# Patient Record
Sex: Female | Born: 1940
Health system: Southern US, Community
[De-identification: ages and names within clinical notes are randomized; demographics above are authoritative.]

## PROBLEM LIST (undated history)

## (undated) DIAGNOSIS — R011 Cardiac murmur, unspecified: Secondary | ICD-10-CM

## (undated) DIAGNOSIS — K219 Gastro-esophageal reflux disease without esophagitis: Secondary | ICD-10-CM

## (undated) DIAGNOSIS — M1990 Unspecified osteoarthritis, unspecified site: Secondary | ICD-10-CM

## (undated) DIAGNOSIS — C50919 Malignant neoplasm of unspecified site of unspecified female breast: Secondary | ICD-10-CM

## (undated) DIAGNOSIS — E785 Hyperlipidemia, unspecified: Secondary | ICD-10-CM

## (undated) DIAGNOSIS — M545 Low back pain, unspecified: Secondary | ICD-10-CM

## (undated) DIAGNOSIS — Z8719 Personal history of other diseases of the digestive system: Secondary | ICD-10-CM

## (undated) DIAGNOSIS — C44310 Basal cell carcinoma of skin of unspecified parts of face: Secondary | ICD-10-CM

## (undated) DIAGNOSIS — H3530 Unspecified macular degeneration: Secondary | ICD-10-CM

## (undated) DIAGNOSIS — K649 Unspecified hemorrhoids: Secondary | ICD-10-CM

## (undated) DIAGNOSIS — J302 Other seasonal allergic rhinitis: Secondary | ICD-10-CM

## (undated) DIAGNOSIS — R0602 Shortness of breath: Secondary | ICD-10-CM

## (undated) DIAGNOSIS — I10 Essential (primary) hypertension: Secondary | ICD-10-CM

## (undated) DIAGNOSIS — K279 Peptic ulcer, site unspecified, unspecified as acute or chronic, without hemorrhage or perforation: Secondary | ICD-10-CM

## (undated) DIAGNOSIS — G8929 Other chronic pain: Secondary | ICD-10-CM

## (undated) DIAGNOSIS — E119 Type 2 diabetes mellitus without complications: Secondary | ICD-10-CM

## (undated) HISTORY — DX: Unspecified osteoarthritis, unspecified site: M19.90

## (undated) HISTORY — DX: Peptic ulcer, site unspecified, unspecified as acute or chronic, without hemorrhage or perforation: K27.9

## (undated) HISTORY — DX: Malignant neoplasm of unspecified site of unspecified female breast: C50.919

## (undated) HISTORY — PX: CATARACT EXTRACTION BILATERAL W/ ANTERIOR VITRECTOMY: SHX1304

## (undated) HISTORY — DX: Unspecified macular degeneration: H35.30

## (undated) HISTORY — PX: KNEE ARTHROSCOPY: SHX127

## (undated) HISTORY — PX: APPENDECTOMY: SHX54

## (undated) HISTORY — PX: BREAST LUMPECTOMY: SHX2

## (undated) HISTORY — PX: FOOT SURGERY: SHX648

## (undated) HISTORY — DX: Essential (primary) hypertension: I10

## (undated) HISTORY — DX: Hyperlipidemia, unspecified: E78.5

## (undated) HISTORY — PX: EYE SURGERY: SHX253

---

## 1997-11-18 ENCOUNTER — Other Ambulatory Visit: Admission: RE | Admit: 1997-11-18 | Discharge: 1997-11-18 | Payer: Self-pay | Admitting: Obstetrics and Gynecology

## 1999-01-11 ENCOUNTER — Other Ambulatory Visit: Admission: RE | Admit: 1999-01-11 | Discharge: 1999-01-11 | Payer: Self-pay | Admitting: Obstetrics and Gynecology

## 2000-02-21 ENCOUNTER — Other Ambulatory Visit: Admission: RE | Admit: 2000-02-21 | Discharge: 2000-02-21 | Payer: Self-pay | Admitting: Obstetrics and Gynecology

## 2001-01-25 ENCOUNTER — Emergency Department (HOSPITAL_COMMUNITY): Admission: EM | Admit: 2001-01-25 | Discharge: 2001-01-25 | Payer: Self-pay | Admitting: Internal Medicine

## 2001-02-12 DIAGNOSIS — K279 Peptic ulcer, site unspecified, unspecified as acute or chronic, without hemorrhage or perforation: Secondary | ICD-10-CM

## 2001-02-12 HISTORY — DX: Peptic ulcer, site unspecified, unspecified as acute or chronic, without hemorrhage or perforation: K27.9

## 2001-08-12 ENCOUNTER — Inpatient Hospital Stay (HOSPITAL_COMMUNITY): Admission: EM | Admit: 2001-08-12 | Discharge: 2001-08-14 | Payer: Self-pay | Admitting: *Deleted

## 2001-08-12 ENCOUNTER — Encounter: Payer: Self-pay | Admitting: *Deleted

## 2001-09-30 ENCOUNTER — Ambulatory Visit (HOSPITAL_COMMUNITY): Admission: RE | Admit: 2001-09-30 | Discharge: 2001-09-30 | Payer: Self-pay | Admitting: Internal Medicine

## 2002-03-27 ENCOUNTER — Other Ambulatory Visit: Admission: RE | Admit: 2002-03-27 | Discharge: 2002-03-27 | Payer: Self-pay | Admitting: Obstetrics and Gynecology

## 2003-06-01 ENCOUNTER — Other Ambulatory Visit: Admission: RE | Admit: 2003-06-01 | Discharge: 2003-06-01 | Payer: Self-pay | Admitting: Obstetrics and Gynecology

## 2004-01-27 ENCOUNTER — Ambulatory Visit: Payer: Self-pay | Admitting: Gastroenterology

## 2004-11-13 ENCOUNTER — Ambulatory Visit (HOSPITAL_COMMUNITY): Admission: RE | Admit: 2004-11-13 | Discharge: 2004-11-13 | Payer: Self-pay | Admitting: Orthopedic Surgery

## 2004-11-13 ENCOUNTER — Ambulatory Visit (HOSPITAL_BASED_OUTPATIENT_CLINIC_OR_DEPARTMENT_OTHER): Admission: RE | Admit: 2004-11-13 | Discharge: 2004-11-13 | Payer: Self-pay | Admitting: Orthopedic Surgery

## 2004-11-21 ENCOUNTER — Encounter (HOSPITAL_COMMUNITY): Admission: RE | Admit: 2004-11-21 | Discharge: 2004-12-22 | Payer: Self-pay | Admitting: Orthopedic Surgery

## 2006-05-20 ENCOUNTER — Encounter: Admission: RE | Admit: 2006-05-20 | Discharge: 2006-05-20 | Payer: Self-pay | Admitting: Orthopaedic Surgery

## 2007-02-10 ENCOUNTER — Ambulatory Visit (HOSPITAL_COMMUNITY): Admission: RE | Admit: 2007-02-10 | Discharge: 2007-02-10 | Payer: Self-pay | Admitting: Family Medicine

## 2007-09-18 ENCOUNTER — Encounter: Admission: RE | Admit: 2007-09-18 | Discharge: 2007-09-18 | Payer: Self-pay | Admitting: Orthopaedic Surgery

## 2007-12-16 ENCOUNTER — Ambulatory Visit: Admission: RE | Admit: 2007-12-16 | Discharge: 2007-12-16 | Payer: Self-pay | Admitting: Orthopaedic Surgery

## 2008-12-07 ENCOUNTER — Ambulatory Visit: Payer: Self-pay | Admitting: Cardiology

## 2008-12-07 DIAGNOSIS — D649 Anemia, unspecified: Secondary | ICD-10-CM

## 2008-12-07 DIAGNOSIS — H3530 Unspecified macular degeneration: Secondary | ICD-10-CM | POA: Insufficient documentation

## 2008-12-07 DIAGNOSIS — R160 Hepatomegaly, not elsewhere classified: Secondary | ICD-10-CM | POA: Insufficient documentation

## 2008-12-07 DIAGNOSIS — I10 Essential (primary) hypertension: Secondary | ICD-10-CM | POA: Insufficient documentation

## 2009-01-25 ENCOUNTER — Inpatient Hospital Stay (HOSPITAL_COMMUNITY): Admission: RE | Admit: 2009-01-25 | Discharge: 2009-01-27 | Payer: Self-pay | Admitting: Orthopaedic Surgery

## 2009-01-27 ENCOUNTER — Encounter (INDEPENDENT_AMBULATORY_CARE_PROVIDER_SITE_OTHER): Payer: Self-pay | Admitting: Orthopaedic Surgery

## 2009-01-27 ENCOUNTER — Ambulatory Visit: Payer: Self-pay | Admitting: Vascular Surgery

## 2009-02-12 HISTORY — PX: TOTAL KNEE ARTHROPLASTY: SHX125

## 2010-05-15 LAB — CBC
HCT: 33.3 % — ABNORMAL LOW (ref 36.0–46.0)
Hemoglobin: 10.8 g/dL — ABNORMAL LOW (ref 12.0–15.0)
MCV: 78.8 fL (ref 78.0–100.0)
Platelets: 223 10*3/uL (ref 150–400)
RBC: 4.22 MIL/uL (ref 3.87–5.11)
WBC: 8.1 10*3/uL (ref 4.0–10.5)

## 2010-05-15 LAB — BASIC METABOLIC PANEL
BUN: 13 mg/dL (ref 6–23)
Chloride: 101 mEq/L (ref 96–112)
Potassium: 4.4 mEq/L (ref 3.5–5.1)
Sodium: 135 mEq/L (ref 135–145)

## 2010-05-15 LAB — GLUCOSE, CAPILLARY: Glucose-Capillary: 120 mg/dL — ABNORMAL HIGH (ref 70–99)

## 2010-05-16 LAB — URINALYSIS, ROUTINE W REFLEX MICROSCOPIC
Bilirubin Urine: NEGATIVE
Bilirubin Urine: NEGATIVE
Hgb urine dipstick: NEGATIVE
Ketones, ur: NEGATIVE mg/dL
Leukocytes, UA: NEGATIVE
Nitrite: NEGATIVE
Nitrite: NEGATIVE
Protein, ur: NEGATIVE mg/dL
Specific Gravity, Urine: 1.022 (ref 1.005–1.030)
Urobilinogen, UA: 0.2 mg/dL (ref 0.0–1.0)
Urobilinogen, UA: 0.2 mg/dL (ref 0.0–1.0)
pH: 5.5 (ref 5.0–8.0)

## 2010-05-16 LAB — URINE CULTURE
Colony Count: NO GROWTH
Culture: NO GROWTH

## 2010-05-16 LAB — PROTIME-INR: INR: 0.92 (ref 0.00–1.49)

## 2010-05-16 LAB — GLUCOSE, CAPILLARY
Glucose-Capillary: 131 mg/dL — ABNORMAL HIGH (ref 70–99)
Glucose-Capillary: 146 mg/dL — ABNORMAL HIGH (ref 70–99)
Glucose-Capillary: 153 mg/dL — ABNORMAL HIGH (ref 70–99)
Glucose-Capillary: 155 mg/dL — ABNORMAL HIGH (ref 70–99)
Glucose-Capillary: 176 mg/dL — ABNORMAL HIGH (ref 70–99)

## 2010-05-16 LAB — CBC
MCHC: 32.6 g/dL (ref 30.0–36.0)
MCHC: 33.3 g/dL (ref 30.0–36.0)
MCV: 78.2 fL (ref 78.0–100.0)
Platelets: 271 10*3/uL (ref 150–400)
RBC: 4.22 MIL/uL (ref 3.87–5.11)
RDW: 16.2 % — ABNORMAL HIGH (ref 11.5–15.5)
RDW: 16.5 % — ABNORMAL HIGH (ref 11.5–15.5)

## 2010-05-16 LAB — URINE MICROSCOPIC-ADD ON

## 2010-05-16 LAB — COMPREHENSIVE METABOLIC PANEL
ALT: 17 U/L (ref 0–35)
Albumin: 3.8 g/dL (ref 3.5–5.2)
Alkaline Phosphatase: 84 U/L (ref 39–117)
Calcium: 9.3 mg/dL (ref 8.4–10.5)
GFR calc Af Amer: >60 mL/min (ref >60)
Potassium: 4.6 mEq/L (ref 3.5–5.1)
Sodium: 139 mEq/L (ref 135–145)
Total Protein: 6.5 g/dL (ref 6.0–8.3)

## 2010-05-16 LAB — DIFFERENTIAL
Basophils Relative: 1 % (ref 0–1)
Eosinophils Absolute: 0.3 10*3/uL (ref 0.0–0.7)
Lymphs Abs: 2.1 10*3/uL (ref 0.7–4.0)
Monocytes Absolute: 0.5 10*3/uL (ref 0.1–1.0)
Monocytes Relative: 7 % (ref 3–12)

## 2010-05-16 LAB — APTT: aPTT: 26 seconds (ref 24–37)

## 2010-05-16 LAB — TYPE AND SCREEN
ABO/RH(D): O NEG
Antibody Screen: NEGATIVE

## 2010-05-16 LAB — BASIC METABOLIC PANEL
CO2: 32 mEq/L (ref 19–32)
Chloride: 101 mEq/L (ref 96–112)
Creatinine, Ser: 0.65 mg/dL (ref 0.4–1.2)
GFR calc Af Amer: >60 mL/min (ref >60)
Glucose, Bld: 146 mg/dL — ABNORMAL HIGH (ref 70–99)

## 2010-05-16 LAB — HEMOGLOBIN A1C: Hgb A1c MFr Bld: 6.7 % — ABNORMAL HIGH (ref 4.6–6.1)

## 2010-05-16 LAB — ABO/RH: ABO/RH(D): O NEG

## 2010-06-30 NOTE — Consult Note (Signed)
[facility]  Patient:    Shelly [name], Shelly [name] Visit Number: [id number] MRN: [id number]          Service Type: MED Location: 2A A203 01 Attending Physician:  Rosalyn Charters Dictated by:   Tana Coast, P.A. Proc. Date: 08/13/01 Admit Date:  08/12/2001   CC:         Donna Bernard, M.D.  Lionel December, M.D.  Day Surgery   Consultation Report  DATE OF BIRTH:  04-16-1940  PHYSICIAN REQUESTING CONSULTATION:  Donna Bernard, M.D.  REASON FOR ADMISSION:  GI bleed.  HISTORY OF PRESENT ILLNESS:  The patient is a 70 year old Caucasian female with past medical history significant for non-insulin-dependent diabetes mellitus (diet controlled), hypertension, arthritis/degenerative disk disease, asthma, who was admitted yesterday with acute GI bleed.  The patient developed three episodes of melena at home.  She was found to have maroon-colored stool by rectal examination in the emergency department.  She became very weak and lightheaded at home and therefore presented to the emergency department.  She was found to be hypotensive with a blood pressure 84/45, pulse rate was 95. She takes Lodine three tablets daily at home for back pain.  She is also on aspirin 81 mg daily.  She takes Tagamet p.r.n.  She denies any history of abdominal pain, vomiting, weight loss, constipation, diarrhea.  She had some nausea a couple of days ago which was self-limiting.  She has had a great appetite.  She has had an EGD and flexible sigmoidoscopy by Dr. Juanda Chance about five years ago.  She tells me she has a hiatal hernia and no history of polyps.  At that time she had an ultrasound which revealed an enlarged liver and gallstones.  She has a family history significant for colon cancer in a half sister who was diagnosed in her late-60s.  In the emergency department she was found to have a hemoglobin of 10.1, hematocrit 29.7, MCV 78.3.  This morning her hemoglobin dropped to  8.8 and hematocrit 25.4.  Her vital signs are stable.  She denies any chest pain or shortness of breath.  She had had no further bleeding since admission.  She has had no further melena or maroon stools since admission.  MEDICATIONS AT HOME: 1. Prinivil 20 mg q.d. 2. Tagamet p.r.n. 3. Allegra p.r.n. 4. Albuterol inhaler p.r.n. 5. Lodine 2-3 tablets daily. 6. Vitamin C and D two tablets q.d. 7. Presivision four tablets q.d. 8. Aspirin 81 mg q.d. 9. Multivitamin q.d.  CURRENT MEDICATIONS: 1. Protonix 40 mg IV q.24h. 2. Normal saline at 75 cc/h. 3. Xanax 0.5 mg b.i.d. p.r.n.  ALLERGIES:  PENICILLIN causes hives.  PAST MEDICAL HISTORY: 1. Diet-controlled non-insulin-dependent diabetes mellitus. 2. Hypertension. 3. Asthma. 4. Macular degeneration. 5. Arthritis. 6. She gives a history of a hiatal hernia, hepatomegaly and cholelithiasis.    Previously worked up by Dr. Juanda Chance.  PAST SURGICAL HISTORY: 1. Bilateral bunionectomy. 2. Left laser surgery of the eye. 3. Appendectomy.  FAMILY HISTORY:  A half sister was diagnosed with colon cancer at age 66, she underwent surgery with chemotherapy and doing well.  She also had uterine cancer years ago.  Her mother died of non-Hodgkins lymphoma.  She has a nephew who died of non-Hodgkins lymphoma/AIDS.  SOCIAL HISTORY:  She lives at home.  She is divorced.  She has two children who live out of town.  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  She quit smoking  in 1983.  Denies any alcohol use.  REVIEW OF SYSTEMS:  Please see HPI for GI in general.  Cardiopulmonary: Denies any chest pain.  She complains of shortness of breath associated with her allergies.  She complains of occasional palpitations which are brief and relieved with taking a deep breath.  PHYSICAL EXAMINATION:  VITAL SIGNS:  Height 5 feet 6 inches, weight 239.7.  Temperature maximum 97.7, temperature current 97.6.  Pulse 76.  Blood pressure  121/59.  Respirations 18.  GENERAL:  A very pleasant, well-nourished, well-developed, pale Caucasian female in no acute distress.  SKIN:  Warm and dry, pale.  HEENT:  Conjunctivae are pale.  Sclerae are nonicteric.  Oropharyngeal mucosa moist and pink.  CHEST:  Clear to auscultation.  CARDIAC EXAM:  Reveals a regular rate and rhythm, a 2/6 systolic ejection murmur heard best in the upper precordium.  ABDOMEN:  Positive bowel sounds, obese but symmetrical, soft and nontender. No organomegaly or masses appreciated.  RECTAL EXAMINATION:  Performed in the emergency department.  She was found to have maroon stool in the rectal vault, Hemoccult positive and external hemorrhoid.  EXTREMITIES:  No cyanosis or edema.  LABORATORIES:  As in HPI.  In addition, she had a white count of 10.7 yesterday, today 9.6; platelets 286,000.  PT 13, INR 1.1, PTT 20.  Sodium 139, potassium 4.6, BUN 39, creatinine 1.2, glucose 171, total bilirubin 0.4, alkaline phosphatase 78, SGOT 15, SGPT 17, albumin 3.2.  IMPRESSION:  The patient is a pleasant 70 year old Caucasian female who was admitted with acute gastrointestinal bleed.  I suspect the source is upper GI tract possibly from peptic ulcer disease given her NSAID use.  Initially she was hypotensive, but currently her vital signs are stable.  Her hemoglobin has dropped 1.3 g/dL over the last 12 hours.  Other possibilities include a bleed from the proximal colon.  SUGGESTIONS: 1. EGD today. 2. Monitor hemoglobin and hematocrit; if hemoglobin drops less than 8 g/dL,    consider transfusion. 3. If EGD is negative, she will need a colonoscopy in the near future.  I    would recommend a colonoscopy at some point anyhow given her family history     positive for colon cancer and the patient reports never having a complete    colonoscopy before. 4. Continue IV Protonix 40 mg q.24h. 5. Continue supportive measures.  I would like to thank Dr. Donna Bernard for allowing [place] to take part in the care of this patient. Dictated by:   Tana Coast, P.A. Attending Physician:  Rosalyn Charters DD:  08/13/01 TD:  08/13/01 Job: [id number]

## 2010-06-30 NOTE — Discharge Summary (Signed)
   NAMEKEYONDA, BICKLE NO.:  [id number]   MEDICAL RECORD NO.:  [id number]                  PATIENT TYPE:   LOCATION:                                       FACILITY:   PHYSICIAN:  Donna Bernard, M.D.             DATE OF BIRTH:   DATE OF ADMISSION:  08/12/2001  DATE OF DISCHARGE:  08/14/2001                                 DISCHARGE SUMMARY   FINAL DIAGNOSES:  1. GI hemorrhage  2. Peptic ulcer disease  3. Anemia secondary to #1  4. Type 2 diabetes   FINAL DISPOSITION:  1. The patient discharged to home.  2. The patient to follow-up in the office in a couple of weeks.  3. Iron gluconate one table t.i.d.  4. Vicodin 1 q. 6 p.r.n. for pain.  5. Prevpac to start in one week to take for 10 days.   ADMISSION HISTORY AND PHYSICAL:  Please see H&P as dictated.   HOSPITAL COURSE:  This patient is a 70 year old white female with a history  of type 2 diabetes who presented to the emergency room the day of admission  with having passed a large amount of dark black stools. The patient noted a  slight chill. She had no significant abdominal discomfort. She was evaluated  in the emergency room and found to be experiencing an acute GI hemorrhage.  Abdominal exam was benign. Hemoglobin was 10.1. Other blood work was good.  The GI folks were consulted. The patient was placed on IV fluids. She was  also given IV proton pump inhibitor therapy. Serial hemoglobins were  monitored and these stayed in the neighborhood of the high eights. Endoscopy  test revealed gastric ulcer along with positive CLOtest. Forty-eight hours  post admission, the patient was feeling better. Her GI bleeding had stopped  completely and hemoglobin remained stable. The patient was discharged home  with diagnosis and disposition as noted above.                                               Donna Bernard, M.D.    Karie Chimera  D:  09/08/2001  T:  09/15/2001  Job:  [id number]

## 2010-06-30 NOTE — Procedures (Signed)
   NAMESAKIYAH, Shelly [name]                        ACCOUNT NO.:  [id number]   MEDICAL RECORD NO.:  [id number]                   PATIENT TYPE:  INP   LOCATION:  A203                                 FACILITY:  [facility]   PHYSICIAN:  Donna Bernard, M.D.             DATE OF BIRTH:  Apr 24, 1940   DATE OF PROCEDURE:  08/13/2001  DATE OF DISCHARGE:  08/14/2001                                EKG INTERPRETATION   PROCEDURE PERFORMED:  EKG   INTERPRETATION:  EKG reveals normal sinus rhythm with no significant ST-T  changes.  There is poor R-wave progression V1-V3, however, a tiny R is  present in all three leads.                                                Donna Bernard, M.D.    Karie Chimera  D:  09/08/2001  T:  09/15/2001  Job:  [id number]

## 2010-06-30 NOTE — Group Therapy Note (Signed)
[facility]  Patient:    Shelly [name], Shelly [name] Visit Number: [id number] MRN: [id number]          Service Type: MED Location: 2A A203 01 Attending Physician:  Harlow Asa Dictated by:   Lilyan Punt, M.D. Proc. Date: 08/13/01 Admit Date:  08/12/2001 Discharge Date: 08/14/2001                               Progress Note  SUBJECTIVE:  The patient overall doing well this morning. Did not have any maroon stools during the night. States she did not have any abdominal cramps, pain, chest tightness, shortness of breath, or dizziness. Taking IV fluids, urinating.  PHYSICAL EXAMINATION:  Lungs CTA. Heart is regular. Abdomen is soft, no guarding or rebound. Extremities no edema.  ASSESSMENT AND PLAN:  Gastrointestinal bleed--hemoglobin down to 8.8. Certainly this is a drop, could well be dilutional given the IV fluids along with some further gastrointestinal bleed. The patient may well need two units if she goes below 8. Will monitor this closely and recheck a hemoglobin and hematocrit at 4 p.m. later today and follow up the patient later today. Dictated by:   Lilyan Punt, M.D. Attending Physician:  Harlow Asa DD:  08/13/01 TD:  08/14/01 Job: [id number]

## 2010-06-30 NOTE — H&P (Signed)
[facility]  Patient:    Shelly [name], YODICE Visit Number: [id number] MRN: [id number]          Service Type: MED Location: 2A A203 01 Attending Physician:  Harlow Asa Dictated by:   Lilyan Punt, M.D. Admit Date:  08/12/2001 Discharge Date: 08/14/2001                           History and Physical  CHIEF COMPLAINT:  Dark maroon stools with dizziness.  HISTORY OF PRESENT ILLNESS:  A 70 year old white female relates she was cutting grass on a riding lawnmower and felt the urge to defecate on the evening of August 12, 2001.  She went to the bathroom and passed a large amount of dark black/maroon stool, and 10-15 minutes later passed a second one, and 10-15 minutes after that passed a third one, all of about equal quantity.  The patient denied any abdominal pain or discomfort.  States that she felt chilled afterwards.  She denied any nausea.  Denied vomiting.  Denied sweats, chest tightness, or pressure pain.  Denied any recent abdominal discomfort such as cramps, pain, or chills.  No change in bowel movement patterns.  Generally normal in the past, not black, not painful.  The patient does state that she has a history of hemorrhoids that occasionally bleeds, but this is usually bright red blood.  PAST MEDICAL HISTORY 1. Hiatal hernia. 2. Type 2 diabetes, diet-controlled. 3. Arthritis in the hips, back, and hands. 4. Macular degeneration. 5. Enlarged liver. 6. Hypertension. 7. Allergic rhinitis. 8. History of appendicitis.  SOCIAL HISTORY:  Married.  Does not smoke.  Two children.  FAMILY HISTORY:  Colon cancer in a half-sister.  Hypertension, diabetes, heart disease.  ALLERGIES:  FLU SHOT gives her the flu.  AMOXICILLIN gave her hives and a rash.  CELEBREX hurt her stomach.  REVIEW OF SYSTEMS:  Negative for headaches, sinus symptoms, dysphagia, chest tightness, SOB, swelling in the legs.  NEUROLOGIC: Normal.  PE, GYN:  NAD.  PHYSICAL  EXAMINATION  VITAL SIGNS:  Stable, heart rate 90.  HEENT:  Tympanic membranes and LT - NL.  NECK:  Supple, no masses.  CHEST:  CTA, RNL.  HEART:  Regular, no murmurs.  ABDOMEN:  Soft, obese, no guarding or rebound, no tenderness.  EXTREMITIES:  No edema.  NEUROLOGIC:  Grossly normal.  RECTAL:  Dark maroon blood noted.  LABORATORY DATA:  On August 12, 2001:  Hemoglobin 10.1, hematocrit 29.7, WBC 10.7. Sodium 139, potassium 4.6, BUN 38, creatinine 1.2, glucose 171. Specific gravity 1.030, bilirubin large, blood large.  Type and screen done x2.  ASSESSMENT/PLAN 1. Gastrointestinal bleed with dark maroon blood is either a proximal to    the ileocecal valve colon bleed, or a stomach/duodenal bleed.  I feel the patient will probably need an EGD and a colonoscopy.  She is n.p.o.  Hopefully can get her done on August 13, 2001.  2. Diabetes type 2.  CHEST:  Accu-Cheks q.a.m.  3. Arthritis.  It is doubtful that the patient should be on NSAIDs after this episode, but certainly will wait for the etiology of her bleed, before making that final decision.  See the orders. Dictated by:   Lilyan Punt, M.D. Attending Physician:  Harlow Asa DD:  08/13/01 TD:  08/15/01 Job: [id number]

## 2010-06-30 NOTE — Op Note (Signed)
NAMEFINNLEIGH, MARCHETTI              ACCOUNT NO.:  [id number]   MEDICAL RECORD NO.:  [id number]          PATIENT TYPE:  AMB   LOCATION:  DSC                          FACILITY:  [facility]   PHYSICIAN:  Feliberto Gottron. Turner Daniels, M.D.   DATE OF BIRTH:  Jun 01, 1940   DATE OF PROCEDURE:  11/13/2004  DATE OF DISCHARGE:  11/13/2004                                 OPERATIVE REPORT   PREOPERATIVE DIAGNOSIS:  Left knee medial meniscal tear with chondromalacia.   POSTOPERATIVE DIAGNOSIS:  1.  Left knee medial meniscal tear.  2.  Chondromalacia, grade 4, from the lateral facet of the patella, lateral      femoral condyle, distally and anteriorly.  3.  Grade 3 chondromalacia from the medial femoral condyle with flap tears.   PROCEDURE:  Arthroscopic debridement of same.   SURGEON:  Feliberto Gottron. Turner Daniels, MD.   FIRST ASSISTANT:  Skip Mayer, PA-C.   ANESTHETIC:  General LMA.   ESTIMATED BLOOD LOSS:  Minimal.   FLUID REPLACEMENT:  800 mL of crystalloid.   DRAINS PLACED:  None.   TOURNIQUET TIME:  None.   INDICATIONS FOR PROCEDURE:  The patient is a 70 year old woman with  arthritic changes to the left knee by x-ray, mechanical catching, popping,  locking and pain in the knee, who desires arthroscopic evaluation and  treatment after the failure of conservative treatment.  The x-rays show  lateral greater than medial compartment osteophytes, 3 mm of cartilage  laterally and 5 mm medially.  She was treated preoperatively with a  cortisone injection and unfortunately only last for a few days.   DESCRIPTION OF PROCEDURE:  The patient was identified by armband and taken  to the operating room at [facility].  Appropriate anesthetic  monitors were attached and general LMA anesthesia induced with the patient  in a supine position.  A lateral post was applied to the table and the left  lower extremity prepped and draped in the usual sterile fashion from the  ankle to the mid-thigh.  Using a #11 blade,  standard inferomedial and  inferolateral peripatellar portals were then made, allowing introduction of  the arthroscope through the inferolateral portal and the outflow through the  inferomedial portal.  We immediately encountered grade 4 chondromalacia of  the anterior and distal aspects of the lateral femoral condyle as well as  the lateral facet of the patella.  Moving to the medial compartment, there  was some degenerative tearing and parrot beak tearing with horizontal  cleavage of the medial meniscus; this was debrided, as were some flap tears  of the medial femoral condyle itself.  The lateral meniscus appeared to be  in relatively good condition.  The edges of the grade 4 area at the lateral  femoral condyle were lightly debrided, smoothing them out as best we could,  but was down to bare bone on bare bone and she may  be a candidate for a total knee replacement of her symptoms persist.  At  this point the knee was irrigated out with normal saline solution, the  arthroscopic instruments removed, a dressing of  Xeroform, 4 x 4 dressing  sponges, Webril and an Ace wrap applied.  The patient was then awakened and  taken to the recovery room without difficulty.      Feliberto Gottron. Turner Daniels, M.D.  Electronically Signed     FJR/MEDQ  D:  01/09/2005  T:  01/10/2005  Job:  [id number]

## 2010-08-21 ENCOUNTER — Encounter: Payer: Self-pay | Admitting: Cardiology

## 2010-08-22 ENCOUNTER — Encounter: Payer: Self-pay | Admitting: Cardiology

## 2010-08-22 ENCOUNTER — Ambulatory Visit (INDEPENDENT_AMBULATORY_CARE_PROVIDER_SITE_OTHER): Payer: Medicare Other | Admitting: Cardiology

## 2010-08-22 DIAGNOSIS — E119 Type 2 diabetes mellitus without complications: Secondary | ICD-10-CM

## 2010-08-22 DIAGNOSIS — E785 Hyperlipidemia, unspecified: Secondary | ICD-10-CM

## 2010-08-22 DIAGNOSIS — K8020 Calculus of gallbladder without cholecystitis without obstruction: Secondary | ICD-10-CM | POA: Insufficient documentation

## 2010-08-22 DIAGNOSIS — J45909 Unspecified asthma, uncomplicated: Secondary | ICD-10-CM

## 2010-08-22 DIAGNOSIS — Z01810 Encounter for preprocedural cardiovascular examination: Secondary | ICD-10-CM

## 2010-08-22 DIAGNOSIS — M1990 Unspecified osteoarthritis, unspecified site: Secondary | ICD-10-CM | POA: Insufficient documentation

## 2010-08-22 DIAGNOSIS — I10 Essential (primary) hypertension: Secondary | ICD-10-CM

## 2010-08-22 DIAGNOSIS — K279 Peptic ulcer, site unspecified, unspecified as acute or chronic, without hemorrhage or perforation: Secondary | ICD-10-CM

## 2010-08-22 NOTE — Patient Instructions (Signed)
Your physician recommends that you schedule a follow-up appointment in: AS NEEDED  

## 2010-08-28 ENCOUNTER — Encounter: Payer: Self-pay | Admitting: *Deleted

## 2010-08-30 ENCOUNTER — Encounter: Payer: Self-pay | Admitting: Cardiology

## 2010-08-30 DIAGNOSIS — E785 Hyperlipidemia, unspecified: Secondary | ICD-10-CM | POA: Insufficient documentation

## 2010-08-30 NOTE — Assessment & Plan Note (Addendum)
Patient is an appropriate candidate for the proposed orthopaedic procedure.  In the absence of known cardiac disease or symptoms to suggest the presence of cardiac disease, no preoperative testing or treatment is warranted.  Her medications include a beta blocker, which might tend to reduce perioperative complications.  [facility] will be happy to assist with any issues of a cardiac nature that arise while she is in hospital or subsequently.

## 2010-08-30 NOTE — Progress Notes (Signed)
HPI : Ms. Manzer returns to the office for reevaluation prior to planned left TKA.  I first evaluated her prior to surgery on her right knee 2 years ago.  She has no known cardiac disease other than a history of heart murmur.  She has no exertional symptoms.  She denies orthopnea, PND, exertional dyspnea, lightheadedness and syncope.  She has mild intermittent pedal edema.  Cardiovascular risk factors include age, postmenopausal status and diabetes controlled with oral agents.  She has mild hyperlipidemia with some difficulty tolerating lipid-lowering medication.  A recent lipid profile is not available to me.  Current Outpatient Prescriptions  Medication Sig Dispense Refill  . albuterol (PROAIR HFA) 108 (90 BASE) MCG/ACT inhaler Inhale 2 puffs into the lungs every 6 (six) hours as needed.        Marland Kitchen amLODipine (NORVASC) 10 MG tablet Take 10 mg by mouth daily.        . benazepril (LOTENSIN) 40 MG tablet Take 40 mg by mouth daily.        . Calcium 600 MG tablet Take 600 mg by mouth 2 (two) times daily.        . carvedilol (COREG) 6.25 MG tablet Take 6.25 mg by mouth 2 (two) times daily.        . celecoxib (CELEBREX) 200 MG capsule Take 200 mg by mouth daily.        . cetirizine (ZYRTEC) 10 MG tablet Take 10 mg by mouth 2 (two) times daily.        . clobetasol (TEMOVATE) 0.05 % cream       . cyclobenzaprine (FLEXERIL) 10 MG tablet Take 10 mg by mouth 3 (three) times daily as needed.        . metFORMIN (GLUMETZA) 500 MG (MOD) 24 hr tablet Take 500 mg by mouth 2 (two) times daily. 2 tabs bid       . moxifloxacin (VIGAMOX) 0.5 % ophthalmic solution Place 1 drop into the right eye 3 (three) times daily.        . Multiple Vitamins-Minerals (PRESERVISION AREDS PO) Take 1 tablet by mouth daily.        . TRICOR 145 MG tablet Take 145 mg by mouth daily.           Allergies  Allergen Reactions  . Celebrex (Celecoxib) Other (See Comments)    Abdominal pain; history of upper GI bleed   . Penicillins  Hives      Past medical history, social history, and family history reviewed and updated.  ROS:   PHYSICAL EXAM: BP 146/89  Pulse 89  Ht 5\' 6"  (1.676 m)  Wt 112.492 kg (248 lb)  BMI 40.03 kg/m2  SpO2 92%  General-Well developed; no acute distress Body habitus-proportionate weight and height Neck-No JVD; no carotid bruits Lungs-clear lung fields; resonant to percussion Cardiovascular-normal PMI; normal S1 and S2 Abdomen-normal bowel sounds; soft and non-tender without masses or organomegaly Musculoskeletal-No deformities, no cyanosis or clubbing Neurologic-Normal cranial nerves; symmetric strength and tone Skin-Warm, no significant lesions; multiple skin tags including on the right eyelid. Extremities-distal pulses intact; no edema  EKG: Normal sinus rhythm; left axis deviation; right ventricular conduction delay; delayed R-wave progression; no previous tracing for comparison.  ASSESSMENT AND PLAN:

## 2010-09-01 ENCOUNTER — Encounter: Payer: Self-pay | Admitting: Cardiology

## 2010-10-09 ENCOUNTER — Encounter (INDEPENDENT_AMBULATORY_CARE_PROVIDER_SITE_OTHER): Payer: Medicare Other | Admitting: Ophthalmology

## 2010-10-09 DIAGNOSIS — H43819 Vitreous degeneration, unspecified eye: Secondary | ICD-10-CM

## 2010-10-09 DIAGNOSIS — H353290 Exudative age-related macular degeneration, unspecified eye, stage unspecified: Secondary | ICD-10-CM

## 2010-10-09 DIAGNOSIS — H3530 Unspecified macular degeneration: Secondary | ICD-10-CM

## 2010-12-18 ENCOUNTER — Encounter (INDEPENDENT_AMBULATORY_CARE_PROVIDER_SITE_OTHER): Payer: Medicare Other | Admitting: Ophthalmology

## 2010-12-18 DIAGNOSIS — H3530 Unspecified macular degeneration: Secondary | ICD-10-CM

## 2010-12-18 DIAGNOSIS — H353290 Exudative age-related macular degeneration, unspecified eye, stage unspecified: Secondary | ICD-10-CM

## 2010-12-18 DIAGNOSIS — H43819 Vitreous degeneration, unspecified eye: Secondary | ICD-10-CM

## 2011-02-13 HISTORY — PX: SKIN CANCER EXCISION: SHX779

## 2011-02-26 ENCOUNTER — Encounter (INDEPENDENT_AMBULATORY_CARE_PROVIDER_SITE_OTHER): Payer: Medicare Other | Admitting: Ophthalmology

## 2011-02-26 DIAGNOSIS — H43819 Vitreous degeneration, unspecified eye: Secondary | ICD-10-CM

## 2011-02-26 DIAGNOSIS — H353290 Exudative age-related macular degeneration, unspecified eye, stage unspecified: Secondary | ICD-10-CM

## 2011-02-26 DIAGNOSIS — H3530 Unspecified macular degeneration: Secondary | ICD-10-CM

## 2011-05-07 ENCOUNTER — Encounter (INDEPENDENT_AMBULATORY_CARE_PROVIDER_SITE_OTHER): Payer: Medicare Other | Admitting: Ophthalmology

## 2011-05-07 DIAGNOSIS — H43819 Vitreous degeneration, unspecified eye: Secondary | ICD-10-CM

## 2011-05-07 DIAGNOSIS — H353290 Exudative age-related macular degeneration, unspecified eye, stage unspecified: Secondary | ICD-10-CM

## 2011-05-07 DIAGNOSIS — H3530 Unspecified macular degeneration: Secondary | ICD-10-CM

## 2011-07-04 ENCOUNTER — Other Ambulatory Visit: Payer: Self-pay | Admitting: Orthopaedic Surgery

## 2011-07-04 DIAGNOSIS — M545 Low back pain: Secondary | ICD-10-CM

## 2011-07-05 ENCOUNTER — Ambulatory Visit
Admission: RE | Admit: 2011-07-05 | Discharge: 2011-07-05 | Disposition: A | Discharge status: Home / Self Care | Payer: Medicare Other | Source: Ambulatory Visit | Attending: Orthopaedic Surgery | Admitting: Orthopaedic Surgery

## 2011-07-05 DIAGNOSIS — M545 Low back pain: Secondary | ICD-10-CM

## 2011-07-16 ENCOUNTER — Encounter (INDEPENDENT_AMBULATORY_CARE_PROVIDER_SITE_OTHER): Payer: Medicare Other | Admitting: Ophthalmology

## 2011-07-16 DIAGNOSIS — H43819 Vitreous degeneration, unspecified eye: Secondary | ICD-10-CM

## 2011-07-16 DIAGNOSIS — H353290 Exudative age-related macular degeneration, unspecified eye, stage unspecified: Secondary | ICD-10-CM

## 2011-07-16 DIAGNOSIS — H3530 Unspecified macular degeneration: Secondary | ICD-10-CM

## 2011-08-21 ENCOUNTER — Encounter (INDEPENDENT_AMBULATORY_CARE_PROVIDER_SITE_OTHER): Payer: Self-pay | Admitting: *Deleted

## 2011-09-24 ENCOUNTER — Encounter (INDEPENDENT_AMBULATORY_CARE_PROVIDER_SITE_OTHER): Payer: Medicare Other | Admitting: Ophthalmology

## 2011-09-24 DIAGNOSIS — H35039 Hypertensive retinopathy, unspecified eye: Secondary | ICD-10-CM

## 2011-09-24 DIAGNOSIS — H43819 Vitreous degeneration, unspecified eye: Secondary | ICD-10-CM

## 2011-09-24 DIAGNOSIS — H353290 Exudative age-related macular degeneration, unspecified eye, stage unspecified: Secondary | ICD-10-CM

## 2011-09-24 DIAGNOSIS — H3530 Unspecified macular degeneration: Secondary | ICD-10-CM

## 2011-11-13 ENCOUNTER — Other Ambulatory Visit: Payer: Self-pay

## 2011-11-13 DIAGNOSIS — R0602 Shortness of breath: Secondary | ICD-10-CM

## 2011-11-27 ENCOUNTER — Other Ambulatory Visit (HOSPITAL_COMMUNITY): Payer: Self-pay | Admitting: Internal Medicine

## 2011-11-27 ENCOUNTER — Ambulatory Visit (HOSPITAL_COMMUNITY)
Admission: RE | Admit: 2011-11-27 | Discharge: 2011-11-27 | Disposition: A | Discharge status: Home / Self Care | Payer: Medicare Other | Source: Ambulatory Visit | Attending: Internal Medicine | Admitting: Internal Medicine

## 2011-11-27 DIAGNOSIS — Z139 Encounter for screening, unspecified: Secondary | ICD-10-CM

## 2011-11-27 DIAGNOSIS — R0602 Shortness of breath: Secondary | ICD-10-CM | POA: Insufficient documentation

## 2011-11-27 DIAGNOSIS — I700 Atherosclerosis of aorta: Secondary | ICD-10-CM | POA: Insufficient documentation

## 2011-11-27 MED ORDER — ALBUTEROL SULFATE (5 MG/ML) 0.5% IN NEBU
2.5000 mg | INHALATION_SOLUTION | Freq: Once | RESPIRATORY_TRACT | Status: AC
  Administered 2011-11-27: 2.5 mg via RESPIRATORY_TRACT

## 2011-11-29 NOTE — Procedures (Signed)
NAMEMYAIRE, [place]              ACCOUNT NO.:  [id number]  MEDICAL RECORD NO.:  [id number]  LOCATION:  RESP                          FACILITY:  [facility]  PHYSICIAN:  Ambriella Kitt L. Juanetta Gosling, M.D.DATE OF BIRTH:  [birth year]  DATE OF PROCEDURE: DATE OF DISCHARGE:  11/27/2011                           PULMONARY FUNCTION TEST   Reason for pulmonary function testing is shortness of breath. 1. Spirometry shows a mild ventilatory defect with evidence of airflow     obstruction. 2. There is no significant bronchodilator improvement. 3. This study is consistent with asthma/COPD.     Cherylee Rawlinson L. Juanetta Gosling, M.D.     ELH/MEDQ  D:  11/29/2011  T:  11/29/2011  Job:  [id number]  cc:   Catalina Pizza, M.D. Fax: [phone]

## 2011-12-03 ENCOUNTER — Encounter (INDEPENDENT_AMBULATORY_CARE_PROVIDER_SITE_OTHER): Payer: Medicare Other | Admitting: Ophthalmology

## 2011-12-03 DIAGNOSIS — H3530 Unspecified macular degeneration: Secondary | ICD-10-CM

## 2011-12-03 DIAGNOSIS — H35039 Hypertensive retinopathy, unspecified eye: Secondary | ICD-10-CM

## 2011-12-03 DIAGNOSIS — H43819 Vitreous degeneration, unspecified eye: Secondary | ICD-10-CM

## 2011-12-03 DIAGNOSIS — E1165 Type 2 diabetes mellitus with hyperglycemia: Secondary | ICD-10-CM

## 2011-12-03 DIAGNOSIS — H353290 Exudative age-related macular degeneration, unspecified eye, stage unspecified: Secondary | ICD-10-CM

## 2011-12-03 DIAGNOSIS — E11319 Type 2 diabetes mellitus with unspecified diabetic retinopathy without macular edema: Secondary | ICD-10-CM

## 2011-12-03 DIAGNOSIS — I10 Essential (primary) hypertension: Secondary | ICD-10-CM

## 2011-12-04 ENCOUNTER — Ambulatory Visit (HOSPITAL_COMMUNITY)
Admission: RE | Admit: 2011-12-04 | Discharge: 2011-12-04 | Disposition: A | Discharge status: Home / Self Care | Payer: Medicare Other | Source: Ambulatory Visit | Attending: Internal Medicine | Admitting: Internal Medicine

## 2011-12-04 DIAGNOSIS — Z139 Encounter for screening, unspecified: Secondary | ICD-10-CM

## 2011-12-04 DIAGNOSIS — Z1231 Encounter for screening mammogram for malignant neoplasm of breast: Secondary | ICD-10-CM | POA: Insufficient documentation

## 2012-02-04 ENCOUNTER — Encounter (INDEPENDENT_AMBULATORY_CARE_PROVIDER_SITE_OTHER): Payer: Medicare Other | Admitting: Ophthalmology

## 2012-02-04 DIAGNOSIS — E11319 Type 2 diabetes mellitus with unspecified diabetic retinopathy without macular edema: Secondary | ICD-10-CM

## 2012-02-04 DIAGNOSIS — H353290 Exudative age-related macular degeneration, unspecified eye, stage unspecified: Secondary | ICD-10-CM

## 2012-02-04 DIAGNOSIS — H43819 Vitreous degeneration, unspecified eye: Secondary | ICD-10-CM

## 2012-02-04 DIAGNOSIS — H35039 Hypertensive retinopathy, unspecified eye: Secondary | ICD-10-CM

## 2012-02-04 DIAGNOSIS — H3530 Unspecified macular degeneration: Secondary | ICD-10-CM

## 2012-02-04 DIAGNOSIS — I10 Essential (primary) hypertension: Secondary | ICD-10-CM

## 2012-02-04 DIAGNOSIS — E1139 Type 2 diabetes mellitus with other diabetic ophthalmic complication: Secondary | ICD-10-CM

## 2012-04-07 ENCOUNTER — Encounter (INDEPENDENT_AMBULATORY_CARE_PROVIDER_SITE_OTHER): Payer: Medicare Other | Admitting: Ophthalmology

## 2012-04-07 DIAGNOSIS — E11319 Type 2 diabetes mellitus with unspecified diabetic retinopathy without macular edema: Secondary | ICD-10-CM

## 2012-04-07 DIAGNOSIS — H353290 Exudative age-related macular degeneration, unspecified eye, stage unspecified: Secondary | ICD-10-CM

## 2012-04-07 DIAGNOSIS — I10 Essential (primary) hypertension: Secondary | ICD-10-CM

## 2012-05-12 ENCOUNTER — Ambulatory Visit
Admission: RE | Admit: 2012-05-12 | Discharge: 2012-05-12 | Disposition: A | Discharge status: Home / Self Care | Payer: Medicare Other | Source: Ambulatory Visit | Attending: Orthopaedic Surgery | Admitting: Orthopaedic Surgery

## 2012-05-12 ENCOUNTER — Other Ambulatory Visit: Payer: Self-pay | Admitting: Orthopaedic Surgery

## 2012-05-12 DIAGNOSIS — R609 Edema, unspecified: Secondary | ICD-10-CM

## 2012-05-12 DIAGNOSIS — R52 Pain, unspecified: Secondary | ICD-10-CM

## 2012-05-13 ENCOUNTER — Encounter: Payer: Self-pay | Admitting: Cardiology

## 2012-05-13 ENCOUNTER — Ambulatory Visit (INDEPENDENT_AMBULATORY_CARE_PROVIDER_SITE_OTHER): Payer: Medicare Other | Admitting: Cardiology

## 2012-05-13 VITALS — BP 138/64 | HR 94 | Ht 66.0 in | Wt 252.0 lb

## 2012-05-13 DIAGNOSIS — E119 Type 2 diabetes mellitus without complications: Secondary | ICD-10-CM

## 2012-05-13 DIAGNOSIS — M1990 Unspecified osteoarthritis, unspecified site: Secondary | ICD-10-CM

## 2012-05-13 DIAGNOSIS — R0989 Other specified symptoms and signs involving the circulatory and respiratory systems: Secondary | ICD-10-CM

## 2012-05-13 DIAGNOSIS — H3530 Unspecified macular degeneration: Secondary | ICD-10-CM

## 2012-05-13 DIAGNOSIS — E785 Hyperlipidemia, unspecified: Secondary | ICD-10-CM

## 2012-05-13 DIAGNOSIS — I10 Essential (primary) hypertension: Secondary | ICD-10-CM

## 2012-05-13 DIAGNOSIS — J45909 Unspecified asthma, uncomplicated: Secondary | ICD-10-CM

## 2012-05-13 NOTE — Assessment & Plan Note (Signed)
Diabetes, obesity and physical deconditioning all contribute to an increased risk of perioperative complications; however, in light of her substantial disability, surgical intervention is appropriate, and some operative risk will need to be accepted.

## 2012-05-13 NOTE — Assessment & Plan Note (Signed)
With minimal symptoms, normal chest x-ray and only mild obstruction without reversibility by bronchodilator on PFTs less than one year ago, it does not appear that patient has significant chronic lung disease. She quit cigarette smoking in 1983. No specific perioperative treatment is required for this problem.

## 2012-05-13 NOTE — Assessment & Plan Note (Signed)
Patient has no known vascular disease and appears to be a very reasonable candidate for the proposed orthopedic surgical procedure. Carotid ultrasound will be performed in light of her carotid bruit.

## 2012-05-13 NOTE — Patient Instructions (Addendum)
Your physician recommends that you schedule a follow-up appointment in: 1 year  Your physician has requested that you have a carotid duplex. This test is an ultrasound of the carotid arteries in your neck. It looks at blood flow through these arteries that supply the brain with blood. Allow one hour for this exam. There are no restrictions or special instructions.  Elevate Legs as much as possible  Low Salt diet  Call us if you have an increase in swelling

## 2012-05-13 NOTE — Progress Notes (Signed)
Patient ID: SUHAYLAH WAMPOLE, female   DOB: March 26, 1940, 72 y.o.   MRN: [id number]  HPI: Schedule return visit for this nice woman with a history of hypertension and other cardiovascular risk factors but no known vascular disease. She has not been seen for more than one year, but now returns prior to planned left TKA, having previously undergone the same operation on the right knee.. She has chronic class II dyspnea on exertion that she attributes to being overweight. There is a remote history of cigarette smoking discontinued in 1983.  Current Outpatient Prescriptions  Medication Sig Dispense Refill  . albuterol (PROAIR HFA) 108 (90 BASE) MCG/ACT inhaler Inhale 2 puffs into the lungs every 6 (six) hours as needed.        Marland Kitchen amLODipine (NORVASC) 10 MG tablet Take 10 mg by mouth daily.        . benazepril (LOTENSIN) 40 MG tablet Take 40 mg by mouth daily.        . carvedilol (COREG) 6.25 MG tablet Take 6.25 mg by mouth 2 (two) times daily.        . cetirizine (ZYRTEC) 10 MG tablet Take 10 mg by mouth daily.       . clobetasol (TEMOVATE) 0.05 % cream Apply 1 application topically 2 (two) times daily as needed.       . cyclobenzaprine (FLEXERIL) 10 MG tablet Take 10 mg by mouth at bedtime as needed.       Marland Kitchen glipiZIDE (GLUCOTROL XL) 5 MG 24 hr tablet Take 5 mg by mouth daily.      . montelukast (SINGULAIR) 10 MG tablet Take 10 mg by mouth as needed.      . moxifloxacin (VIGAMOX) 0.5 % ophthalmic solution Place 1 drop into the right eye 3 (three) times daily. As directed      . Multiple Vitamins-Minerals (PRESERVISION AREDS PO) Take 1 tablet by mouth daily.        . pravastatin (PRAVACHOL) 10 MG tablet Take 10 mg by mouth daily.      . sitaGLIPtan-metformin (JANUMET) 50-1000 MG per tablet Take 1 tablet by mouth 2 (two) times daily with a meal.      . traMADol (ULTRAM) 50 MG tablet Take 50 mg by mouth every 8 (eight) hours as needed for pain.      . TRICOR 145 MG tablet Take 145 mg by mouth daily.         No current facility-administered medications for this visit.   Allergies  Allergen Reactions  . Celebrex (Celecoxib) Other (See Comments)    Abdominal pain; history of upper GI bleed   . Penicillins Hives     Past medical history, social history, and family history reviewed and updated.  ROS: Denies chest pain, orthopnea, PND, lightheadedness or syncope.  PHYSICAL EXAM: BP 138/64  Pulse 94  Ht 5\' 6"  (1.676 m)  Wt 114.306 kg (252 lb)  BMI 40.69 kg/m2;  Body mass index is 40.69 kg/(m^2). General-Well developed; no acute distress Body habitus-obese Neck-No JVD;  left carotid bruit Lungs-clear lung fields; resonant to percussion Cardiovascular-normal PMI; normal S1 and S2 Abdomen-normal bowel sounds; soft and non-tender without masses or organomegaly Musculoskeletal-No deformities, no cyanosis or clubbing Neurologic-Normal cranial nerves; symmetric strength and tone Skin-Warm, no significant lesions Extremities-distal pulses intact; 1/2+ pedal edema  EKG: Normal sinus rhythm; left axis deviation  [place] Bing, MD 05/13/2012  4:46 PM  ASSESSMENT AND PLAN

## 2012-05-13 NOTE — Assessment & Plan Note (Addendum)
Patient is receiving a low dose of pravastatin, which is slightly suboptimal for her.  No recent serum lipid results available. A new profile will be ordered if recent results cannot be located.

## 2012-05-13 NOTE — Progress Notes (Deleted)
Name: Shelly [name]    DOB: 01/28/1941  Age: 72 y.o.  MR#: [id number]       PCP:  Dwana Melena, MD      Insurance: Payor: [employer]  Plan: BLUE MEDICARE [facility]  Product Type: *No Product type*    CC:   No chief complaint on file.  Medication List Pre Op for Left Knee Replacement  -  Dr Cleophas Dunker with [facility]  VS Filed Vitals:   05/13/12 1427  BP: 138/64  Pulse: 94  Height: 5\' 6"  (1.676 m)  Weight: 252 lb (114.306 kg)    Weights Current Weight  05/13/12 252 lb (114.306 kg)  08/22/10 248 lb (112.492 kg)  12/07/08 247 lb (112.038 kg)    Blood Pressure  BP Readings from Last 3 Encounters:  05/13/12 138/64  08/22/10 146/89  12/07/08 132/70     Admit date:  (Not on file) Last encounter with RMR:  Visit date not found   Allergy Celebrex and Penicillins  Current Outpatient Prescriptions  Medication Sig Dispense Refill  . albuterol (PROAIR HFA) 108 (90 BASE) MCG/ACT inhaler Inhale 2 puffs into the lungs every 6 (six) hours as needed.        Marland Kitchen amLODipine (NORVASC) 10 MG tablet Take 10 mg by mouth daily.        . benazepril (LOTENSIN) 40 MG tablet Take 40 mg by mouth daily.        . carvedilol (COREG) 6.25 MG tablet Take 6.25 mg by mouth 2 (two) times daily.        . cetirizine (ZYRTEC) 10 MG tablet Take 10 mg by mouth daily.       . clobetasol (TEMOVATE) 0.05 % cream Apply 1 application topically 2 (two) times daily as needed.       . cyclobenzaprine (FLEXERIL) 10 MG tablet Take 10 mg by mouth at bedtime as needed.       Marland Kitchen glipiZIDE (GLUCOTROL XL) 5 MG 24 hr tablet Take 5 mg by mouth daily.      . montelukast (SINGULAIR) 10 MG tablet Take 10 mg by mouth as needed.      . moxifloxacin (VIGAMOX) 0.5 % ophthalmic solution Place 1 drop into the right eye 3 (three) times daily. As directed      . Multiple Vitamins-Minerals (PRESERVISION AREDS PO) Take 1 tablet by mouth daily.        . pravastatin (PRAVACHOL) 10 MG tablet Take 10 mg by mouth daily.       . sitaGLIPtan-metformin (JANUMET) 50-1000 MG per tablet Take 1 tablet by mouth 2 (two) times daily with a meal.      . traMADol (ULTRAM) 50 MG tablet Take 50 mg by mouth every 8 (eight) hours as needed for pain.      . TRICOR 145 MG tablet Take 145 mg by mouth daily.        No current facility-administered medications for this visit.    Discontinued Meds:    Medications Discontinued During This Encounter  Medication Reason  . Calcium 600 MG tablet Error  . metFORMIN (GLUMETZA) 500 MG (MOD) 24 hr tablet Error  . celecoxib (CELEBREX) 200 MG capsule Error    Patient Active Problem List  Diagnosis  . MACULAR DEGENERATION  . HYPERTENSION  . HEPATOMEGALY  . Asthma  . Diabetes mellitus  . PUD (peptic ulcer disease)  . Degenerative joint disease  . Cholelithiasis  . Hyperlipidemia    LABS    Component  Value Date/Time   NA 135 01/27/2009 0441   NA 137 01/26/2009 0620   NA 139 01/21/2009 1603   K 4.4 01/27/2009 0441   K 4.1 01/26/2009 0620   K 4.6 01/21/2009 1603   CL 101 01/27/2009 0441   CL 101 01/26/2009 0620   CL 102 01/21/2009 1603   CO2 28 01/27/2009 0441   CO2 32 01/26/2009 0620   CO2 30 01/21/2009 1603   GLUCOSE 176* 01/27/2009 0441   GLUCOSE 146* 01/26/2009 0620   GLUCOSE 174* 01/21/2009 1603   BUN 13 01/27/2009 0441   BUN 13 01/26/2009 0620   BUN 16 01/21/2009 1603   CREATININE 0.66 01/27/2009 0441   CREATININE 0.65 01/26/2009 0620   CREATININE 0.81 01/21/2009 1603   CALCIUM 8.2* 01/27/2009 0441   CALCIUM 8.2* 01/26/2009 0620   CALCIUM 9.3 01/21/2009 1603   GFRNONAA >60 01/27/2009 0441   GFRNONAA >60 01/26/2009 0620   GFRNONAA >60 01/21/2009 1603   GFRAA  Value: >60        The eGFR has been calculated using the MDRD equation. This calculation has not been validated in all clinical situations. eGFR's persistently <60 mL/min signify possible Chronic Kidney Disease. 01/27/2009 0441   GFRAA  Value: >60        The eGFR has been calculated using the MDRD  equation. This calculation has not been validated in all clinical situations. eGFR's persistently <60 mL/min signify possible Chronic Kidney Disease. 01/26/2009 0620   GFRAA  Value: >60        The eGFR has been calculated using the MDRD equation. This calculation has not been validated in all clinical situations. eGFR's persistently <60 mL/min signify possible Chronic Kidney Disease. 01/21/2009 1603   CMP     Component Value Date/Time   NA 135 01/27/2009 0441   K 4.4 01/27/2009 0441   CL 101 01/27/2009 0441   CO2 28 01/27/2009 0441   GLUCOSE 176* 01/27/2009 0441   BUN 13 01/27/2009 0441   CREATININE 0.66 01/27/2009 0441   CALCIUM 8.2* 01/27/2009 0441   PROT 6.5 01/21/2009 1603   ALBUMIN 3.8 01/21/2009 1603   AST 17 01/21/2009 1603   ALT 17 01/21/2009 1603   ALKPHOS 84 01/21/2009 1603   BILITOT 0.8 01/21/2009 1603   GFRNONAA >60 01/27/2009 0441   GFRAA  Value: >60        The eGFR has been calculated using the MDRD equation. This calculation has not been validated in all clinical situations. eGFR's persistently <60 mL/min signify possible Chronic Kidney Disease. 01/27/2009 0441       Component Value Date/Time   WBC 8.1 01/27/2009 0441   WBC 7.4 01/26/2009 0620   WBC 6.8 01/21/2009 1603   HGB 10.8* 01/27/2009 0441   HGB 11.0* 01/26/2009 0620   HGB 13.1 01/21/2009 1603   HCT 33.3* 01/27/2009 0441   HCT 33.0* 01/26/2009 0620   HCT 40.4 01/21/2009 1603   MCV 78.8 01/27/2009 0441   MCV 78.2 01/26/2009 0620   MCV 78.2 01/21/2009 1603    Lipid Panel  No results found for this basename: chol, trig, hdl, cholhdl, vldl, ldlcalc    ABG No results found for this basename: phart, pco2, pco2art, po2, po2art, hco3, tco2, acidbasedef, o2sat     No results found for this basename: TSH   BNP (last 3 results) No results found for this basename: PROBNP,  in the last 8760 hours Cardiac Panel (last 3 results) No results found for this basename: CKTOTAL, CKMB, TROPONINI, RELINDX,  in the  last 72 hours  Iron/TIBC/Ferritin No results found for this basename: iron, tibc, ferritin     EKG Orders placed in visit on 05/13/12  . EKG 12-LEAD     Prior Assessment and Plan Problem List as of 05/13/2012     ICD-9-CM     Cardiology Problems   HYPERTENSION   Hyperlipidemia     Other   MACULAR DEGENERATION   HEPATOMEGALY   Asthma   Diabetes mellitus   Last Assessment & Plan   08/22/2010 Office Visit Edited 09/01/2010  6:20 PM by Kathlen Brunswick, MD     Patient is an appropriate candidate for the proposed orthopaedic procedure.  In the absence of known cardiac disease or symptoms to suggest the presence of cardiac disease, no preoperative testing or treatment is warranted.  Her medications include a beta blocker, which might tend to reduce perioperative complications.  [facility] will be happy to assist with any issues of a cardiac nature that arise while she is in hospital or subsequently.      PUD (peptic ulcer disease)   Degenerative joint disease   Cholelithiasis       Imaging: US Venous Img Lower Unilateral Left  05/12/2012  *RADIOLOGY REPORT*  Clinical Data: Left leg pain and swelling  LEFT LOWER EXTREMITY VENOUS DUPLEX ULTRASOUND  Technique: Gray-scale sonography with graded compression, as well as color Doppler and duplex ultrasound, were performed to evaluate the deep venous system of the left lower extremity from the level of the common femoral vein through the popliteal and proximal calf veins. Spectral Doppler was utilized to evaluate flow at rest and with distal augmentation maneuvers.  Comparison: No comparison studies available.  Findings: Left common femoral vein, superficial femoral vein, saphenofemoral junction, profunda femoral vein, and popliteal vein show normal patent directional flow, normal phasicity, normal augmentation, normal compression.  Greater saphenous vein is compressible throughout.  Visualized portions of the posterior tibial vein are  unremarkable.  IMPRESSION: No evidence for DVT in the left lower extremity.   Original Report Authenticated By: Kennith Center, M.D.

## 2012-05-13 NOTE — Assessment & Plan Note (Addendum)
Blood pressure control has been adequate in recent months. Current medication will be continued.  Patient has fairly mild peripheral edema that she finds somewhat uncomfortable. We will start with conservative measures of salt restriction and leg elevation. If these prove unsuccessful, diuretics can be introduced.

## 2012-05-15 ENCOUNTER — Ambulatory Visit (HOSPITAL_COMMUNITY)
Admission: RE | Admit: 2012-05-15 | Discharge: 2012-05-15 | Disposition: A | Discharge status: Home / Self Care | Payer: Medicare Other | Source: Ambulatory Visit | Attending: Cardiology | Admitting: Cardiology

## 2012-05-15 DIAGNOSIS — R0989 Other specified symptoms and signs involving the circulatory and respiratory systems: Secondary | ICD-10-CM | POA: Insufficient documentation

## 2012-05-15 DIAGNOSIS — Z87891 Personal history of nicotine dependence: Secondary | ICD-10-CM | POA: Insufficient documentation

## 2012-05-15 DIAGNOSIS — I10 Essential (primary) hypertension: Secondary | ICD-10-CM | POA: Insufficient documentation

## 2012-05-16 ENCOUNTER — Other Ambulatory Visit: Payer: Self-pay | Admitting: *Deleted

## 2012-05-16 ENCOUNTER — Encounter: Payer: Self-pay | Admitting: Cardiology

## 2012-05-16 DIAGNOSIS — Z9289 Personal history of other medical treatment: Secondary | ICD-10-CM | POA: Insufficient documentation

## 2012-05-19 ENCOUNTER — Encounter: Payer: Self-pay | Admitting: Cardiology

## 2012-05-29 ENCOUNTER — Encounter: Payer: Self-pay | Admitting: Cardiology

## 2012-06-20 ENCOUNTER — Encounter (HOSPITAL_COMMUNITY): Payer: Self-pay | Admitting: Pharmacy Technician

## 2012-06-25 ENCOUNTER — Encounter (HOSPITAL_COMMUNITY): Payer: Self-pay

## 2012-06-25 ENCOUNTER — Encounter (HOSPITAL_COMMUNITY)
Admission: RE | Admit: 2012-06-25 | Discharge: 2012-06-25 | Disposition: A | Discharge status: Home / Self Care | Payer: Medicare Other | Source: Ambulatory Visit | Attending: Orthopaedic Surgery | Admitting: Orthopaedic Surgery

## 2012-06-25 DIAGNOSIS — Z01812 Encounter for preprocedural laboratory examination: Secondary | ICD-10-CM | POA: Insufficient documentation

## 2012-06-25 DIAGNOSIS — R82998 Other abnormal findings in urine: Secondary | ICD-10-CM | POA: Insufficient documentation

## 2012-06-25 DIAGNOSIS — Z01818 Encounter for other preprocedural examination: Secondary | ICD-10-CM | POA: Insufficient documentation

## 2012-06-25 HISTORY — DX: Personal history of other diseases of the digestive system: Z87.19

## 2012-06-25 HISTORY — DX: Cardiac murmur, unspecified: R01.1

## 2012-06-25 HISTORY — DX: Gastro-esophageal reflux disease without esophagitis: K21.9

## 2012-06-25 HISTORY — DX: Unspecified hemorrhoids: K64.9

## 2012-06-25 HISTORY — DX: Other seasonal allergic rhinitis: J30.2

## 2012-06-25 LAB — COMPREHENSIVE METABOLIC PANEL
Albumin: 3.5 g/dL (ref 3.5–5.2)
Alkaline Phosphatase: 57 U/L (ref 39–117)
BUN: 23 mg/dL (ref 6–23)
CO2: 32 mEq/L (ref 19–32)
Chloride: 101 mEq/L (ref 96–112)
Creatinine, Ser: 0.82 mg/dL (ref 0.50–1.10)
GFR calc Af Amer: 81 mL/min — ABNORMAL LOW (ref >90)
GFR calc non Af Amer: 70 mL/min — ABNORMAL LOW (ref >90)
Glucose, Bld: 135 mg/dL — ABNORMAL HIGH (ref 70–99)
Potassium: 4.2 mEq/L (ref 3.5–5.1)
Total Bilirubin: 0.3 mg/dL (ref 0.3–1.2)

## 2012-06-25 LAB — PROTIME-INR: Prothrombin Time: 12.9 seconds (ref 11.6–15.2)

## 2012-06-25 LAB — CBC
HCT: 41.5 % (ref 36.0–46.0)
MCH: 26.1 pg (ref 26.0–34.0)
MCHC: 32.3 g/dL (ref 30.0–36.0)
MCV: 80.7 fL (ref 78.0–100.0)
Platelets: 276 10*3/uL (ref 150–400)
RDW: 15.1 % (ref 11.5–15.5)

## 2012-06-25 LAB — URINALYSIS, ROUTINE W REFLEX MICROSCOPIC
Bilirubin Urine: NEGATIVE
Hgb urine dipstick: NEGATIVE
Ketones, ur: NEGATIVE mg/dL
Specific Gravity, Urine: 1.029 (ref 1.005–1.030)
Urobilinogen, UA: 0.2 mg/dL (ref 0.0–1.0)

## 2012-06-25 LAB — URINE MICROSCOPIC-ADD ON

## 2012-06-25 NOTE — Pre-Procedure Instructions (Signed)
Shelly [name]  06/25/2012   Your procedure is scheduled on:  Tuesday, May 20th  Report to [facility] at 5:30AM.  Call this number if you have problems the morning of surgery: [phone]   Remember:   Do not eat food or drink liquids after midnight.   Take these medicines the morning of surgery with A SIP OF WATER: amLODipine (NORVASC),carvedilol (COREG),cetirizine (ZYRTEC) .  May take if needed: traMADol (ULTRAM),montelukast (SINGULAIR,cyclobenzaprine (FLEXERIL),albuterol (PROAIR HFA) 108 (90 BASE).  Bring   albuterol (PROAIR HFA) 108 (90 BASE)  To the hospital with you.    Do not wear jewelry, make-up or nail polish.  Do not wear lotions, powders, or perfumes. You may wear deodorant.  Do not shave 48 hours prior to surgery.   Do not bring valuables to the hospital.  Contacts, dentures or bridgework may not be worn into surgery.  Leave suitcase in the car. After surgery it may be brought to your room.  For patients admitted to the hospital, checkout time is 11:00 AM the day of discharge.   Special Instructions: Shower using CHG 2 nights before surgery and the night before surgery.  If you shower the day of surgery use CHG.  Use special wash - you have one bottle of CHG for all showers.  You should use approximately 1/3 of the bottle for each shower.   Please read over the following fact sheets that you were given: Pain Booklet, Coughing and Deep Breathing, Blood Transfusion Information and Surgical Site Infection Prevention

## 2012-06-25 NOTE — H&P (Signed)
CHIEF COMPLAINT:  Painful left knee.   HISTORY OF PRESENT ILLNESS:  Shelly [name] is a very pleasant, 72 year old white female who is seen today for evaluation of her left knee.  She has had a right total knee arthroplasty in the past and has done extremely well with this.  This was somewhere around 2008.  However, she has had at least a year of pain in the left knee.  She has also had some problems with her low back and radicular-type symptoms and has been seen by Dr. Alvester Morin who has tried several injections but has not necessarily had a benefit.  At that time it was discussed whether other surgical intervention would be indicated, and she chose at this time not to consider surgery for her back, but because of the chronic aching, throbbing, moderately severe pain in the left knee, she would like to consider surgical intervention.  She does have pain with every step.  She does use assistive devices as necessary.  She certainly has had corticosteroid injections in the past.  These appear to have become refractory.  She has tried anti-inflammatories, including Celebrex.  Seen today for evaluation.   PAST MEDICAL HISTORY:  Her general health is good.   HOSPITALIZATIONS:  In 1974 appendectomy, 1980s for surgery to both feet, 2003 for EGD and cauterization of ulcers, mid 1990s arthroscopic debridement of both knees, 2008 right total knee arthroplasty, 2011 for injections to the eye for macular degeneration bilaterally, 2007 for cataract surgery.  She has also had 2 children, 1966 and 1969.     MEDICATIONS:  Include albuterol 2 puffs p.r.n., amlodipine 10 mg daily, benazepril 40 mg daily, carvedilol 6.25 b.i.d., Zyrtec 10 mg daily, clobetasol 0.05% cream twice daily as needed, glipizide 5 mg daily, Singular 10 one daily as needed, Vigamox 0.5% only after her eye injections, PreserVision b.i.d., pravastatin 10 mg daily, Janumet 50/1000 b.i.d., tramadol 50 mg every 8 hours as needed for pain, TriCor 145 daily.    ALLERGIES:  PERCOCET which causes her constipation.  She is also allergic to CELEBREX and PENICILLIN.   FOURTEEN-POINT REVIEW OF SYSTEMS:  Negative except for macular degeneration which gives her blurred vision and certainly decreased vision.  She has had a history of bronchitis and has been noted to have asthma most of her life with intermittent exacerbations.  She does have a history of hypertension for 10 years.  Asymptomatic heart murmur.  Has had leg cramps in the past, and Dopplers were negative.  Ulcers were noted where she did have a GI bleed, and  EGD and cauterization was performed. She is a diabetic since the 1990s.  She does have some basal cell carcinomas on the face which have been resected.  Nocturia 0 to 4 times per evening.   FAMILY HISTORY:  Positive for her mother who died at age 78 from non-Hodgkin's lymphoma.  Father who was deceased at age 28 from uremic poisoning and diabetes mellitus.  She has 2 sisters, 1 who died at age 59 from some type of experimental brain radiation treatment to prevent metastasis from carcinoma.  She does have 1 sister that is 53 years of age.   SOCIAL HISTORY:  She is a 72 year old white female, divorced, retired from [employer].  She smoked 2-1/2 pack of cigarettes per day for 27 years and quit in 1983.  Denies drinking alcohol at this time.    PHYSICAL EXAMINATION:  Today revels a very pleasant, 72 year old white female, well developed, well nourished, obese, alert,  pleasant, cooperative, in moderate distress secondary to left knee pain.  She is 5 feet 6 inches and weighs 240 pounds.  Her respiration rate is normal, and her BMI is 38.7.  Vital signs reveal a temperature of 97 degrees, pulse 80, respirations 16, and blood pressure 146/82.  Head:  Normocephalic.  Eyes:  Reveal pupils to be reactive to light and accommodation.  Extraocular movements intact.  Ears/Nose/Throat:  Benign.  Neck:  Supple, and no bruits are noted.  Chest:  Had good  expansion.  Lungs:  Essentially clear.  Cardiac:  Had a regular rhythm and rate.  No S1, S2.  Has a grade II systolic murmur.  Pulses:  Were 1+, bilaterally symmetric in the dorsalis pedis bilaterally.  Abdomen:  Is scaphoid, soft, nontender.  No mass palpable.  Normal bowel sounds present.  Genital/Rectal/Breast:  Not indicated with an orthopedic evaluation.  CNS:  She is oriented x3, and cranial nerves II through XII are grossly intact.  Musculoskeletal:  She has range of motion from about 3 to 4 degrees to 100 degrees.  She does have a 1+ effusion.  She has pseudolaxity about the knee with varus and valgus stressing.  She does have 2 portals noted from her previous arthroscopy which are well healed.  Good motion of both hips.   RADIOGRAPHS:  X-rays from 05/12/2012 have been reviewed again, revealing medial joint line bone-on-bone. There is periarticular spurring both medially, laterally, and peripatellar.  Multiple loose bodies posteriorly are possible.  Cystic changes on the lateral tibial plateau.  Sclerotic bone, medial tibial plateau.   CLINICAL IMPRESSION: 1.  End-stage OA of the left knee. 2.  Macular degeneration. 3.  Hypertension. 4.  Diabetes. 5.  History of asthma.   RECOMMENDATIONS:   At this time I have reviewed forms and documentation from Dr. Dietrich Pates who feels that she is a candidate for surgical intervention.  I also have reviewed from Dr. Margo Aye stating that her A1c is in a good range, and she has not had much in the way of any other reasons for contraindication to surgical intervention.    Therefore, our plan is to proceed with a left total knee arthroplasty in the very near future.  Procedure, risks, and benefits were fully explained to the patient.  She is understanding.  I used appropriate models to show her the components.  All questions were answered in detail.   Oris Drone Aleda [facility] [phone]  06/25/2012 5:12 PM

## 2012-06-26 LAB — URINE CULTURE

## 2012-06-30 MED ORDER — SODIUM CHLORIDE 0.9 % IV SOLN
1500.0000 mg | INTRAVENOUS | Status: AC
  Administered 2012-07-01: 1500 mg via INTRAVENOUS
  Filled 2012-06-30: qty 1500

## 2012-06-30 MED ORDER — CHLORHEXIDINE GLUCONATE 4 % EX LIQD: 60.0000 mL | Freq: Every day | CUTANEOUS | Status: DC

## 2012-06-30 MED ORDER — ACETAMINOPHEN 10 MG/ML IV SOLN
1000.0000 mg | Freq: Once | INTRAVENOUS | Status: AC
  Administered 2012-07-01: 1000 mg via INTRAVENOUS
  Filled 2012-06-30: qty 100

## 2012-06-30 MED ORDER — CHLORHEXIDINE GLUCONATE 4 % EX LIQD: 60.0000 mL | Freq: Once | CUTANEOUS | Status: DC

## 2012-06-30 MED ORDER — SODIUM CHLORIDE 0.9 % IV SOLN: INTRAVENOUS | Status: DC

## 2012-07-01 ENCOUNTER — Encounter (HOSPITAL_COMMUNITY): Payer: Self-pay | Admitting: *Deleted

## 2012-07-01 ENCOUNTER — Encounter (HOSPITAL_COMMUNITY)
Admission: RE | Disposition: A | Discharge status: Home Health | Payer: Self-pay | Source: Ambulatory Visit | Attending: Orthopaedic Surgery

## 2012-07-01 ENCOUNTER — Ambulatory Visit (HOSPITAL_COMMUNITY): Payer: Medicare Other | Admitting: Certified Registered"

## 2012-07-01 ENCOUNTER — Inpatient Hospital Stay (HOSPITAL_COMMUNITY)
Admission: RE | Admit: 2012-07-01 | Discharge: 2012-07-03 | DRG: 470 | Disposition: A | Discharge status: Home Health | Payer: Medicare Other | Source: Ambulatory Visit | Attending: Orthopaedic Surgery | Admitting: Orthopaedic Surgery

## 2012-07-01 ENCOUNTER — Encounter (HOSPITAL_COMMUNITY): Payer: Self-pay | Admitting: Certified Registered"

## 2012-07-01 DIAGNOSIS — M1712 Unilateral primary osteoarthritis, left knee: Secondary | ICD-10-CM

## 2012-07-01 DIAGNOSIS — Z880 Allergy status to penicillin: Secondary | ICD-10-CM

## 2012-07-01 DIAGNOSIS — Z79899 Other long term (current) drug therapy: Secondary | ICD-10-CM

## 2012-07-01 DIAGNOSIS — M545 Low back pain, unspecified: Secondary | ICD-10-CM | POA: Diagnosis present

## 2012-07-01 DIAGNOSIS — E669 Obesity, unspecified: Secondary | ICD-10-CM | POA: Diagnosis present

## 2012-07-01 DIAGNOSIS — D62 Acute posthemorrhagic anemia: Secondary | ICD-10-CM | POA: Diagnosis not present

## 2012-07-01 DIAGNOSIS — E871 Hypo-osmolality and hyponatremia: Secondary | ICD-10-CM | POA: Diagnosis not present

## 2012-07-01 DIAGNOSIS — Z7901 Long term (current) use of anticoagulants: Secondary | ICD-10-CM

## 2012-07-01 DIAGNOSIS — Z807 Family history of other malignant neoplasms of lymphoid, hematopoietic and related tissues: Secondary | ICD-10-CM

## 2012-07-01 DIAGNOSIS — Z833 Family history of diabetes mellitus: Secondary | ICD-10-CM

## 2012-07-01 DIAGNOSIS — Z6841 Body Mass Index (BMI) 40.0 and over, adult: Secondary | ICD-10-CM

## 2012-07-01 DIAGNOSIS — H3530 Unspecified macular degeneration: Secondary | ICD-10-CM | POA: Diagnosis present

## 2012-07-01 DIAGNOSIS — K279 Peptic ulcer, site unspecified, unspecified as acute or chronic, without hemorrhage or perforation: Secondary | ICD-10-CM | POA: Diagnosis present

## 2012-07-01 DIAGNOSIS — Z888 Allergy status to other drugs, medicaments and biological substances status: Secondary | ICD-10-CM

## 2012-07-01 DIAGNOSIS — I10 Essential (primary) hypertension: Secondary | ICD-10-CM | POA: Diagnosis present

## 2012-07-01 DIAGNOSIS — E785 Hyperlipidemia, unspecified: Secondary | ICD-10-CM | POA: Diagnosis present

## 2012-07-01 DIAGNOSIS — K219 Gastro-esophageal reflux disease without esophagitis: Secondary | ICD-10-CM | POA: Diagnosis present

## 2012-07-01 DIAGNOSIS — E119 Type 2 diabetes mellitus without complications: Secondary | ICD-10-CM | POA: Diagnosis present

## 2012-07-01 DIAGNOSIS — J45909 Unspecified asthma, uncomplicated: Secondary | ICD-10-CM | POA: Diagnosis present

## 2012-07-01 DIAGNOSIS — M1710 Unilateral primary osteoarthritis, unspecified knee: Principal | ICD-10-CM | POA: Diagnosis present

## 2012-07-01 DIAGNOSIS — Z96659 Presence of unspecified artificial knee joint: Secondary | ICD-10-CM

## 2012-07-01 DIAGNOSIS — G8929 Other chronic pain: Secondary | ICD-10-CM | POA: Diagnosis present

## 2012-07-01 HISTORY — DX: Low back pain, unspecified: M54.50

## 2012-07-01 HISTORY — DX: Other chronic pain: G89.29

## 2012-07-01 HISTORY — DX: Low back pain: M54.5

## 2012-07-01 HISTORY — PX: TOTAL KNEE ARTHROPLASTY: SHX125

## 2012-07-01 HISTORY — DX: Basal cell carcinoma of skin of unspecified parts of face: C44.310

## 2012-07-01 HISTORY — DX: Shortness of breath: R06.02

## 2012-07-01 HISTORY — DX: Type 2 diabetes mellitus without complications: E11.9

## 2012-07-01 LAB — GLUCOSE, CAPILLARY
Glucose-Capillary: 189 mg/dL — ABNORMAL HIGH (ref 70–99)
Glucose-Capillary: 178 mg/dL — ABNORMAL HIGH (ref 70–99)
Glucose-Capillary: 148 mg/dL — ABNORMAL HIGH (ref 70–99)

## 2012-07-01 SURGERY — ARTHROPLASTY, KNEE, TOTAL
Anesthesia: Regional | Site: Knee | Laterality: Left | Wound class: Clean

## 2012-07-01 MED ORDER — PHENOL 1.4 % MT LIQD: 1.0000 | OROMUCOSAL | Status: DC | PRN

## 2012-07-01 MED ORDER — BUPIVACAINE-EPINEPHRINE PF 0.5-1:200000 % IJ SOLN
INTRAMUSCULAR | Status: DC | PRN
  Administered 2012-07-01: 30 mL

## 2012-07-01 MED ORDER — BENAZEPRIL HCL 40 MG PO TABS
40.0000 mg | ORAL_TABLET | Freq: Every day | ORAL | Status: DC
  Administered 2012-07-01 – 2012-07-03 (×3): 40 mg via ORAL
  Filled 2012-07-01 (×3): qty 1

## 2012-07-01 MED ORDER — THROMBIN 20000 UNITS EX KIT
PACK | CUTANEOUS | Status: DC | PRN
  Administered 2012-07-01: 20000 [IU] via TOPICAL

## 2012-07-01 MED ORDER — ONDANSETRON HCL 4 MG/2ML IJ SOLN: 4.0000 mg | Freq: Four times a day (QID) | INTRAMUSCULAR | Status: DC | PRN

## 2012-07-01 MED ORDER — OXYCODONE HCL 5 MG PO TABS
5.0000 mg | ORAL_TABLET | ORAL | Status: DC | PRN
  Administered 2012-07-02 (×2): 10 mg via ORAL
  Filled 2012-07-01 (×2): qty 2

## 2012-07-01 MED ORDER — METHOCARBAMOL 100 MG/ML IJ SOLN
500.0000 mg | Freq: Four times a day (QID) | INTRAVENOUS | Status: DC | PRN
  Filled 2012-07-01: qty 5

## 2012-07-01 MED ORDER — MONTELUKAST SODIUM 10 MG PO TABS
10.0000 mg | ORAL_TABLET | ORAL | Status: DC | PRN
  Filled 2012-07-01: qty 1

## 2012-07-01 MED ORDER — ONDANSETRON HCL 4 MG/2ML IJ SOLN
INTRAMUSCULAR | Status: DC | PRN
  Administered 2012-07-01: 4 mg via INTRAVENOUS

## 2012-07-01 MED ORDER — THROMBIN 20000 UNITS EX KIT
PACK | CUTANEOUS | Status: AC
  Filled 2012-07-01: qty 1

## 2012-07-01 MED ORDER — BUPIVACAINE-EPINEPHRINE PF 0.25-1:200000 % IJ SOLN
INTRAMUSCULAR | Status: AC
  Filled 2012-07-01: qty 30

## 2012-07-01 MED ORDER — FLEET ENEMA 7-19 GM/118ML RE ENEM: 1.0000 | ENEMA | Freq: Once | RECTAL | Status: AC | PRN

## 2012-07-01 MED ORDER — SITAGLIPTIN PHOS-METFORMIN HCL 50-1000 MG PO TABS: 1.0000 | ORAL_TABLET | Freq: Two times a day (BID) | ORAL | Status: DC

## 2012-07-01 MED ORDER — GLIPIZIDE ER 5 MG PO TB24
5.0000 mg | ORAL_TABLET | Freq: Every day | ORAL | Status: DC
  Administered 2012-07-02 – 2012-07-03 (×2): 5 mg via ORAL
  Filled 2012-07-01 (×3): qty 1

## 2012-07-01 MED ORDER — MAGNESIUM HYDROXIDE 400 MG/5ML PO SUSP: 30.0000 mL | Freq: Every day | ORAL | Status: DC | PRN

## 2012-07-01 MED ORDER — LACTATED RINGERS IV SOLN
INTRAVENOUS | Status: DC | PRN
  Administered 2012-07-01 (×2): via INTRAVENOUS

## 2012-07-01 MED ORDER — OXYCODONE HCL 5 MG PO TABS
ORAL_TABLET | ORAL | Status: AC
  Administered 2012-07-01: 5 mg
  Filled 2012-07-01: qty 1

## 2012-07-01 MED ORDER — METOCLOPRAMIDE HCL 10 MG PO TABS: 5.0000 mg | ORAL_TABLET | Freq: Three times a day (TID) | ORAL | Status: DC | PRN

## 2012-07-01 MED ORDER — METHOCARBAMOL 500 MG PO TABS
ORAL_TABLET | ORAL | Status: AC
  Administered 2012-07-01: 500 mg via ORAL
  Filled 2012-07-01: qty 1

## 2012-07-01 MED ORDER — METOCLOPRAMIDE HCL 5 MG/ML IJ SOLN: 5.0000 mg | Freq: Three times a day (TID) | INTRAMUSCULAR | Status: DC | PRN

## 2012-07-01 MED ORDER — LIDOCAINE HCL (CARDIAC) 20 MG/ML IV SOLN
INTRAVENOUS | Status: DC | PRN
  Administered 2012-07-01: 75 mg via INTRAVENOUS

## 2012-07-01 MED ORDER — ACETAMINOPHEN 10 MG/ML IV SOLN
INTRAVENOUS | Status: AC
  Filled 2012-07-01: qty 100

## 2012-07-01 MED ORDER — AMLODIPINE BESYLATE 10 MG PO TABS
10.0000 mg | ORAL_TABLET | Freq: Every day | ORAL | Status: DC
  Administered 2012-07-01 – 2012-07-03 (×3): 10 mg via ORAL
  Filled 2012-07-01 (×3): qty 1

## 2012-07-01 MED ORDER — LINAGLIPTIN 5 MG PO TABS
5.0000 mg | ORAL_TABLET | Freq: Every day | ORAL | Status: DC
  Administered 2012-07-02 – 2012-07-03 (×3): 5 mg via ORAL
  Filled 2012-07-01 (×4): qty 1

## 2012-07-01 MED ORDER — CLOBETASOL PROPIONATE 0.05 % EX CREA: 1.0000 "application " | TOPICAL_CREAM | Freq: Two times a day (BID) | CUTANEOUS | Status: DC | PRN

## 2012-07-01 MED ORDER — DOCUSATE SODIUM 100 MG PO CAPS
100.0000 mg | ORAL_CAPSULE | Freq: Two times a day (BID) | ORAL | Status: DC
  Administered 2012-07-01 – 2012-07-03 (×4): 100 mg via ORAL
  Filled 2012-07-01 (×4): qty 1

## 2012-07-01 MED ORDER — OXYCODONE HCL 5 MG PO TABS: 5.0000 mg | ORAL_TABLET | Freq: Once | ORAL | Status: DC | PRN

## 2012-07-01 MED ORDER — FENTANYL CITRATE 0.05 MG/ML IJ SOLN
INTRAMUSCULAR | Status: DC | PRN
  Administered 2012-07-01: 100 ug via INTRAVENOUS
  Administered 2012-07-01 (×3): 50 ug via INTRAVENOUS

## 2012-07-01 MED ORDER — OXYCODONE HCL 5 MG/5ML PO SOLN
5.0000 mg | Freq: Once | ORAL | Status: DC | PRN
Start: 2012-07-01 — End: 2012-07-01

## 2012-07-01 MED ORDER — SODIUM CHLORIDE 0.9 % IR SOLN
Status: DC | PRN
  Administered 2012-07-01: 3000 mL

## 2012-07-01 MED ORDER — PROPOFOL 10 MG/ML IV BOLUS
INTRAVENOUS | Status: DC | PRN
  Administered 2012-07-01: 40 mg via INTRAVENOUS
  Administered 2012-07-01: 160 mg via INTRAVENOUS

## 2012-07-01 MED ORDER — METFORMIN HCL 500 MG PO TABS
1000.0000 mg | ORAL_TABLET | Freq: Two times a day (BID) | ORAL | Status: DC
  Administered 2012-07-01 – 2012-07-03 (×4): 1000 mg via ORAL
  Filled 2012-07-01 (×6): qty 2

## 2012-07-01 MED ORDER — ONDANSETRON HCL 4 MG PO TABS: 4.0000 mg | ORAL_TABLET | Freq: Four times a day (QID) | ORAL | Status: DC | PRN

## 2012-07-01 MED ORDER — MIDAZOLAM HCL 5 MG/5ML IJ SOLN
INTRAMUSCULAR | Status: DC | PRN
  Administered 2012-07-01: 2 mg via INTRAVENOUS

## 2012-07-01 MED ORDER — ACETAMINOPHEN 10 MG/ML IV SOLN
1000.0000 mg | Freq: Four times a day (QID) | INTRAVENOUS | Status: DC
Start: 2012-07-01 — End: 2012-07-02
  Administered 2012-07-01 – 2012-07-02 (×3): 1000 mg via INTRAVENOUS
  Filled 2012-07-01 (×5): qty 100

## 2012-07-01 MED ORDER — ALUM & MAG HYDROXIDE-SIMETH 200-200-20 MG/5ML PO SUSP: 30.0000 mL | ORAL | Status: DC | PRN

## 2012-07-01 MED ORDER — HYDROMORPHONE HCL PF 1 MG/ML IJ SOLN
0.5000 mg | INTRAMUSCULAR | Status: DC | PRN
  Administered 2012-07-01: 0.5 mg via INTRAVENOUS

## 2012-07-01 MED ORDER — MENTHOL 3 MG MT LOZG: 1.0000 | LOZENGE | OROMUCOSAL | Status: DC | PRN

## 2012-07-01 MED ORDER — SUCCINYLCHOLINE CHLORIDE 20 MG/ML IJ SOLN
INTRAMUSCULAR | Status: DC | PRN
  Administered 2012-07-01: 100 mg via INTRAVENOUS

## 2012-07-01 MED ORDER — HYDROMORPHONE HCL PF 1 MG/ML IJ SOLN
INTRAMUSCULAR | Status: AC
  Filled 2012-07-01: qty 1

## 2012-07-01 MED ORDER — VANCOMYCIN HCL IN DEXTROSE 1-5 GM/200ML-% IV SOLN
1000.0000 mg | Freq: Two times a day (BID) | INTRAVENOUS | Status: AC
  Administered 2012-07-01: 1000 mg via INTRAVENOUS
  Filled 2012-07-01 (×2): qty 200

## 2012-07-01 MED ORDER — ALBUTEROL SULFATE HFA 108 (90 BASE) MCG/ACT IN AERS: 2.0000 | INHALATION_SPRAY | Freq: Four times a day (QID) | RESPIRATORY_TRACT | Status: DC | PRN

## 2012-07-01 MED ORDER — RIVAROXABAN 10 MG PO TABS
10.0000 mg | ORAL_TABLET | ORAL | Status: DC
  Administered 2012-07-01 – 2012-07-02 (×2): 10 mg via ORAL
  Filled 2012-07-01 (×3): qty 1

## 2012-07-01 MED ORDER — BISACODYL 10 MG RE SUPP: 10.0000 mg | Freq: Every day | RECTAL | Status: DC | PRN

## 2012-07-01 MED ORDER — SODIUM CHLORIDE 0.9 % IV SOLN
75.0000 mL/h | INTRAVENOUS | Status: DC
  Administered 2012-07-01: 75 mL/h via INTRAVENOUS

## 2012-07-01 MED ORDER — METHOCARBAMOL 500 MG PO TABS
500.0000 mg | ORAL_TABLET | Freq: Four times a day (QID) | ORAL | Status: DC | PRN
  Administered 2012-07-02 – 2012-07-03 (×3): 500 mg via ORAL
  Filled 2012-07-01 (×4): qty 1

## 2012-07-01 MED ORDER — BUPIVACAINE-EPINEPHRINE 0.25% -1:200000 IJ SOLN
INTRAMUSCULAR | Status: DC | PRN
  Administered 2012-07-01: 30 mL

## 2012-07-01 MED ORDER — INSULIN ASPART 100 UNIT/ML ~~LOC~~ SOLN
0.0000 [IU] | Freq: Three times a day (TID) | SUBCUTANEOUS | Status: DC
  Administered 2012-07-01: 3 [IU] via SUBCUTANEOUS
  Administered 2012-07-02 (×2): 5 [IU] via SUBCUTANEOUS
  Administered 2012-07-02 – 2012-07-03 (×3): 3 [IU] via SUBCUTANEOUS

## 2012-07-01 MED ORDER — CARVEDILOL 6.25 MG PO TABS
6.2500 mg | ORAL_TABLET | Freq: Two times a day (BID) | ORAL | Status: DC
  Administered 2012-07-02 – 2012-07-03 (×3): 6.25 mg via ORAL
  Filled 2012-07-01 (×6): qty 1

## 2012-07-01 MED ORDER — HYDROMORPHONE HCL PF 1 MG/ML IJ SOLN
0.2500 mg | INTRAMUSCULAR | Status: DC | PRN
  Administered 2012-07-01 (×2): 0.5 mg via INTRAVENOUS

## 2012-07-01 SURGICAL SUPPLY — 58 items
BANDAGE ESMARK 6X9 LF (GAUZE/BANDAGES/DRESSINGS) ×1 IMPLANT
BLADE SAGITTAL 25.0X1.19X90 (BLADE) ×2 IMPLANT
BNDG CMPR 9X6 STRL LF SNTH (GAUZE/BANDAGES/DRESSINGS) ×1
BNDG ESMARK 6X9 LF (GAUZE/BANDAGES/DRESSINGS) ×2
BOWL SMART MIX CTS (DISPOSABLE) ×2 IMPLANT
CEMENT HV SMART SET (Cement) ×4 IMPLANT
CLOTH BEACON ORANGE TIMEOUT ST (SAFETY) ×2 IMPLANT
COVER BACK TABLE 24X17X13 BIG (DRAPES) ×1 IMPLANT
COVER SURGICAL LIGHT HANDLE (MISCELLANEOUS) ×2 IMPLANT
CUFF TOURNIQUET SINGLE 34IN LL (TOURNIQUET CUFF) ×1 IMPLANT
CUFF TOURNIQUET SINGLE 44IN (TOURNIQUET CUFF) IMPLANT
DRAPE EXTREMITY T 121X128X90 (DRAPE) ×2 IMPLANT
DRAPE PROXIMA HALF (DRAPES) ×2 IMPLANT
DRSG ADAPTIC 3X8 NADH LF (GAUZE/BANDAGES/DRESSINGS) ×2 IMPLANT
DRSG PAD ABDOMINAL 8X10 ST (GAUZE/BANDAGES/DRESSINGS) ×4 IMPLANT
DURAPREP 26ML APPLICATOR (WOUND CARE) ×6 IMPLANT
ELECT CAUTERY BLADE 6.4 (BLADE) ×2 IMPLANT
ELECT REM PT RETURN 9FT ADLT (ELECTROSURGICAL) ×2
ELECTRODE REM PT RTRN 9FT ADLT (ELECTROSURGICAL) ×1 IMPLANT
EVACUATOR 1/8 PVC DRAIN (DRAIN) ×1 IMPLANT
FACESHIELD LNG OPTICON STERILE (SAFETY) ×4 IMPLANT
FLOSEAL 10ML (HEMOSTASIS) IMPLANT
GLOVE BIOGEL PI IND STRL 8 (GLOVE) ×1 IMPLANT
GLOVE BIOGEL PI IND STRL 8.5 (GLOVE) ×1 IMPLANT
GLOVE BIOGEL PI INDICATOR 8 (GLOVE) ×1
GLOVE BIOGEL PI INDICATOR 8.5 (GLOVE) ×1
GLOVE ECLIPSE 8.0 STRL XLNG CF (GLOVE) ×4 IMPLANT
GLOVE SURG ORTHO 8.5 STRL (GLOVE) ×3 IMPLANT
GOWN PREVENTION PLUS XXLARGE (GOWN DISPOSABLE) ×2 IMPLANT
GOWN STRL NON-REIN LRG LVL3 (GOWN DISPOSABLE) ×4 IMPLANT
HANDPIECE INTERPULSE COAX TIP (DISPOSABLE) ×2
KIT BASIN OR (CUSTOM PROCEDURE TRAY) ×2 IMPLANT
KIT ROOM TURNOVER OR (KITS) ×2 IMPLANT
MANIFOLD NEPTUNE II (INSTRUMENTS) ×2 IMPLANT
NEEDLE 22X1 1/2 (OR ONLY) (NEEDLE) ×1 IMPLANT
NS IRRIG 1000ML POUR BTL (IV SOLUTION) ×2 IMPLANT
PACK TOTAL JOINT (CUSTOM PROCEDURE TRAY) ×2 IMPLANT
PAD ARMBOARD 7.5X6 YLW CONV (MISCELLANEOUS) ×4 IMPLANT
PAD CAST 4YDX4 CTTN HI CHSV (CAST SUPPLIES) ×1 IMPLANT
PADDING CAST COTTON 4X4 STRL (CAST SUPPLIES) ×2
PADDING CAST COTTON 6X4 STRL (CAST SUPPLIES) ×2 IMPLANT
SET HNDPC FAN SPRY TIP SCT (DISPOSABLE) ×1 IMPLANT
SPONGE GAUZE 4X4 12PLY (GAUZE/BANDAGES/DRESSINGS) ×2 IMPLANT
STAPLER VISISTAT 35W (STAPLE) ×2 IMPLANT
SUCTION FRAZIER TIP 10 FR DISP (SUCTIONS) ×2 IMPLANT
SUT BONE WAX W31G (SUTURE) ×3 IMPLANT
SUT ETHIBOND NAB CT1 #1 30IN (SUTURE) ×5 IMPLANT
SUT MNCRL AB 3-0 PS2 18 (SUTURE) ×2 IMPLANT
SUT VIC AB 0 CT1 27 (SUTURE) ×2
SUT VIC AB 0 CT1 27XBRD ANBCTR (SUTURE) ×1 IMPLANT
SUT VIC AB 1 CT1 27 (SUTURE)
SUT VIC AB 1 CT1 27XBRD ANBCTR (SUTURE) IMPLANT
SYR CONTROL 10ML LL (SYRINGE) IMPLANT
TOWEL OR 17X24 6PK STRL BLUE (TOWEL DISPOSABLE) ×2 IMPLANT
TOWEL OR 17X26 10 PK STRL BLUE (TOWEL DISPOSABLE) ×2 IMPLANT
TRAY FOLEY CATH 14FR (SET/KITS/TRAYS/PACK) ×2 IMPLANT
WATER STERILE IRR 1000ML POUR (IV SOLUTION) ×4 IMPLANT
WRAP KNEE MAXI GEL POST OP (GAUZE/BANDAGES/DRESSINGS) ×2 IMPLANT

## 2012-07-01 NOTE — Anesthesia Postprocedure Evaluation (Signed)
Anesthesia Post Note  Patient: Shelly [name]  Procedure(s) Performed: Procedure(s) (LRB): LEFT TOTAL KNEE ARTHROPLASTY (Left)  Anesthesia type: General  Patient location: PACU  Post pain: Pain level controlled and Adequate analgesia  Post assessment: Post-op Vital signs reviewed, Patient's Cardiovascular Status Stable, Respiratory Function Stable, Patent Airway and Pain level controlled  Last Vitals:  Filed Vitals:   07/01/12 1100  BP:   Pulse: 90  Temp:   Resp: 16    Post vital signs: Reviewed and stable  Level of consciousness: awake, alert  and oriented  Complications: No apparent anesthesia complications

## 2012-07-01 NOTE — Progress Notes (Signed)
UR COMPLETED  

## 2012-07-01 NOTE — Transfer of Care (Signed)
Immediate Anesthesia Transfer of Care Note  Patient: Shelly [name]  Procedure(s) Performed: Procedure(s) with comments: LEFT TOTAL KNEE ARTHROPLASTY (Left) - Left Total Knee Arthroplasty  Patient Location: PACU  Anesthesia Type:GA combined with regional for post-op pain  Level of Consciousness: awake  Airway & Oxygen Therapy: Patient Spontanous Breathing and Patient connected to nasal cannula oxygen  Post-op Assessment: Report given to PACU RN, Post -op Vital signs reviewed and stable and Patient moving all extremities  Post vital signs: Reviewed and stable  Complications: No apparent anesthesia complications

## 2012-07-01 NOTE — Evaluation (Signed)
Physical Therapy Evaluation Patient Details Name: Shelly [name] MRN: [id number] DOB: 1940/09/15 Today's Date: 07/01/2012 Time: 3244-0102 PT Time Calculation (min): 36 min  PT Assessment / Plan / Recommendation Clinical Impression  72 y.o. female admitted to [facility] with elective L TKA (h/o R TKA) PWB 50%.  She is POD #0 toady and is mobilizing well OOB to chair.  I anticipate that she will progress as expected and discahrge with HHPT f/u with [place].  She has no DME needed at this time.      PT Assessment  Patient needs continued PT services    Follow Up Recommendations  Home health PT;Supervision/Assistance - 24 hour    Does the patient have the potential to tolerate intense rehabilitation     Yes  Barriers to Discharge None None    Equipment Recommendations  None recommended by PT    Recommendations for Other Services   none  Frequency 7X/week    Precautions / Restrictions Precautions Precautions: Knee Restrictions Weight Bearing Restrictions: Yes LLE Weight Bearing: Partial weight bearing LLE Partial Weight Bearing Percentage or Pounds: 50%   Pertinent Vitals/Pain See vitals flow sheet.      Mobility  Bed Mobility Bed Mobility: Supine to Sit;Sitting - Scoot to Edge of Bed Supine to Sit: 6: Modified independent (Device/Increase time);With rails;HOB elevated Sitting - Scoot to Edge of Bed: 6: Modified independent (Device/Increase time);With rail Details for Bed Mobility Assistance: Pt scooting to EOB on her own while therapist gathering lines.   Transfers Transfers: Sit to Stand;Stand to [employer] Transfers Sit to Stand: 4: Min assist;With upper extremity assist;From bed Stand to Sit: 4: Min guard;With upper extremity assist;With armrests;To chair/3-in-1 Stand Pivot Transfers: 4: Min assist Details for Transfer Assistance: min assist to steady pt for balance.  Pivotal steps around with RW to chair on her right side.      Exercises Total Joint  Exercises Ankle Circles/Pumps: AROM;Both;20 reps;Seated Other Exercises Other Exercises: incintive spirometer x 6 reps 1750-2000 mL volume.     PT Diagnosis: Difficulty walking;Abnormality of gait;Generalized weakness;Acute pain  PT Problem List: Decreased strength;Decreased range of motion;Decreased activity tolerance;Decreased balance;Decreased mobility;Decreased knowledge of use of DME;Decreased knowledge of precautions;Pain PT Treatment Interventions: DME instruction;Gait training;Stair training;Functional mobility training;Therapeutic exercise;Therapeutic activities;Balance training;Cognitive remediation;Neuromuscular re-education;Patient/family education;Modalities   PT Goals Acute Rehab PT Goals PT Goal Formulation: With patient Time For Goal Achievement: 07/08/12 Potential to Achieve Goals: Good Pt will go Supine/Side to Sit: with modified independence;with HOB 0 degrees PT Goal: Supine/Side to Sit - Progress: Goal set today Pt will go Sit to Supine/Side: with modified independence;with HOB 0 degrees PT Goal: Sit to Supine/Side - Progress: Goal set today Pt will go Sit to Stand: with supervision PT Goal: Sit to Stand - Progress: Goal set today Pt will go Stand to Sit: with supervision PT Goal: Stand to Sit - Progress: Goal set today Pt will Ambulate: >150 feet;with supervision;with rolling walker PT Goal: Ambulate - Progress: Goal set today Pt will Go Up / Down Stairs: 3-5 stairs;with rolling walker;with min assist PT Goal: Up/Down Stairs - Progress: Goal set today  Visit Information  Last PT Received On: 07/08/12 Assistance Needed: +1    Subjective Data  Subjective: Pt is ready to get OOB. She is not comfortable in knee extension foam pillow.   Patient Stated Goal: to go home tomorrow.     Prior Functioning  Home Living Lives With: Family Available Help at Discharge: Available 24 hours/day Type of Home: House Home Access: Stairs  to enter Entrance Stairs-Number of  Steps: 3 Entrance Stairs-Rails: None Home Layout: One level Bathroom Shower/Tub: Walk-in shower;Door [employer] Toilet: Standard Bathroom Accessibility: Yes How Accessible: Accessible via walker Home Adaptive Equipment: Shower chair with back;Walker - rolling;Straight cane;Crutches;Bedside commode/3-in-1;Hand-held shower hose;Reacher Additional Comments: off and on using the cane to walk Prior Function Level of Independence: Independent with assistive device(s) Able to Take Stairs?: Yes Driving: Yes Vocation: Retired Comments: go out to eat, shopping, dancing, aquatic therapy.   Communication Communication: No difficulties Dominant Hand: Right    Cognition  Cognition Arousal/Alertness: Awake/alert Behavior During Therapy: WFL for tasks assessed/performed Overall Cognitive Status: Within Functional Limits for tasks assessed    Extremity/Trunk Assessment Left Upper Extremity Assessment LUE Sensation: Deficits LUE Sensation Deficits: left hand numbness and tingling new since surgery. Right Lower Extremity Assessment RLE ROM/Strength/Tone: WFL for tasks assessed (older R TKA) RLE Sensation: WFL - Light Touch Left Lower Extremity Assessment LLE ROM/Strength/Tone: Deficits LLE ROM/Strength/Tone Deficits: ankle at least 3/5, knee 2/5, hip 2+/5 LLE Sensation: WFL - Light Touch   Balance Balance Balance Assessed: Yes Static Sitting Balance Static Sitting - Balance Support: No upper extremity supported;Feet supported Static Sitting - Level of Assistance: 7: Independent Static Standing Balance Static Standing - Balance Support: Bilateral upper extremity supported Static Standing - Level of Assistance: 4: Min assist Static Standing - Comment/# of Minutes: with RW  End of Session PT - End of Session Activity Tolerance: Patient limited by pain Patient left: in chair;with call bell/phone within reach;with family/visitor present Nurse Communication: Mobility status;Other (comment) (left  O2 off due to sats 93% on RA.) CPM Left Knee CPM Left Knee: Off (per pt request)    Lurena Joiner B. Bow Buntyn, PT, DPT [phone]   07/01/2012, 4:31 PM

## 2012-07-01 NOTE — H&P (Signed)
  PATIENT ID:      Shelly [name]  MRN:     [id number] DOB/AGE:    Dec 14, 1940 / 72 y.o.       OPERATIVE REPORT    DATE OF PROCEDURE:  07/01/2012       PREOPERATIVE DIAGNOSIS:   Left Knee Osteoarthritis-end stage                                                       Estimated body mass index is 40.69 kg/(m^2) as calculated from the following:   Height as of 06/25/12: 5\' 6"  (1.676 m).   Weight as of 05/13/12: 114.306 kg (252 lb).     POSTOPERATIVE DIAGNOSIS:   osteoarthritis left knee-end stage                                                                     Estimated body mass index is 40.69 kg/(m^2) as calculated from the following:   Height as of 06/25/12: 5\' 6"  (1.676 m).   Weight as of 05/13/12: 114.306 kg (252 lb).     PROCEDURE:  Procedure(s): LEFT TOTAL KNEE ARTHROPLASTY left     SURGEON:  [name], MD    ASSISTANT:   [name], PA-C   (Present and scrubbed throughout the case, critical for assistance with exposure, retraction, instrumentation, and closure.)          ANESTHESIA: regional and general     DRAINS: (left knee) Hemovact drain(s) in the open with  Suction Open :      TOURNIQUET TIME:  Total Tourniquet Time Documented: Thigh (Left) - 88 minutes Total: Thigh (Left) - 88 minutes     COMPLICATIONS:  None   CONDITION:  stable  PROCEDURE IN DETAIL: [id number]   Shelly [name] 07/01/2012, 9:38 AM

## 2012-07-01 NOTE — Anesthesia Preprocedure Evaluation (Signed)
Anesthesia Evaluation  Patient identified by MRN, date of birth, ID band Patient awake    Reviewed: Allergy & Precautions, H&P , NPO status , Patient's Chart, lab work & pertinent test results  Airway Mallampati: II  Neck ROM: full    Dental   Pulmonary shortness of breath, asthma , former smoker,          Cardiovascular hypertension,     Neuro/Psych    GI/Hepatic hiatal hernia, PUD, GERD-  ,  Endo/Other  diabetes, Type obesity  Renal/GU      Musculoskeletal   Abdominal   Peds  Hematology   Anesthesia Other Findings   Reproductive/Obstetrics                           Anesthesia Physical Anesthesia Plan  ASA: III  Anesthesia Plan: General and Regional   Post-op Pain Management: MAC Combined w/ Regional for Post-op pain   Induction: Intravenous  Airway Management Planned: Oral ETT  Additional Equipment:   Intra-op Plan:   Post-operative Plan: Extubation in OR  Informed Consent: I have reviewed the patients History and Physical, chart, labs and discussed the procedure including the risks, benefits and alternatives for the proposed anesthesia with the patient or authorized representative who has indicated his/her understanding and acceptance.     Plan Discussed with: CRNA, Anesthesiologist and Surgeon  Anesthesia Plan Comments:         Anesthesia Quick Evaluation

## 2012-07-01 NOTE — Anesthesia Procedure Notes (Addendum)
Anesthesia Regional Block:  Femoral nerve block  Pre-Anesthetic Checklist: ,, timeout performed, Correct Patient, Correct Site, Correct Laterality, Correct Procedure,, site marked, risks and benefits discussed, Surgical consent,  Pre-op evaluation,  At surgeon's request and post-op pain management  Laterality: Left  Prep: chloraprep       Needles:  Injection technique: Single-shot  Needle Type: Echogenic Stimulator Needle     Needle Length: 9cm  Needle Gauge: 21    Additional Needles:  Procedures: nerve stimulator Femoral nerve block  Nerve Stimulator or Paresthesia:  Response: Quadriceps muscle contraction, 0.45 mA,   Additional Responses:   Narrative:  Start time: 07/01/2012 7:05 AM End time: 07/01/2012 7:18 AM Injection made incrementally with aspirations every 5 mL.  Performed by: Personally  Anesthesiologist: Dr Chaney Malling  Additional Notes: Functioning IV was confirmed and monitors were applied.  A 90mm 21ga Arrow echogenic stimulator needle was used. Sterile prep and drape,hand hygiene and sterile gloves were used.  Negative aspiration and negative test dose prior to incremental administration of local anesthetic. The patient tolerated the procedure well.    Femoral nerve block Procedure Name: Intubation Date/Time: 07/01/2012 7:35 AM Performed by: Charm Barges, Nasim Habeeb R Pre-anesthesia Checklist: Patient identified, Emergency Drugs available, Suction available, Patient being monitored and Timeout performed Patient Re-evaluated:Patient Re-evaluated prior to inductionOxygen Delivery Method: Circle system utilized Preoxygenation: Pre-oxygenation with 100% oxygen Intubation Type: IV induction Ventilation: Mask ventilation without difficulty Laryngoscope Size: Mac and 3 Tube type: Oral Tube size: 7.5 mm Number of attempts: 1 Airway Equipment and Method: Stylet Placement Confirmation: ETT inserted through vocal cords under direct vision and positive ETCO2 Secured at: 21  cm Tube secured with: Tape Dental Injury: Teeth and Oropharynx as per pre-operative assessment

## 2012-07-01 NOTE — Op Note (Signed)
NAMEOLUWADAMILOLA, DELIZ NO.:  [id number]  MEDICAL RECORD NO.:  [id number]  LOCATION:  5N07C                        FACILITY:  [facility]  PHYSICIAN:  Claude Manges. Gurtaj Ruz, M.D.DATE OF BIRTH:  17-Jan-1941  DATE OF PROCEDURE:  07/01/2012 DATE OF DISCHARGE:                              OPERATIVE REPORT   PREOPERATIVE DIAGNOSIS:  End-stage osteoarthritis, left knee.  POSTOPERATIVE DIAGNOSIS:  End-stage osteoarthritis, left knee.  Body mass index 41.  PROCEDURE:  Left total knee replacement.  SURGEON:  Claude Manges. Cleophas Dunker, MD  ASSISTANT:  Oris Drone. Petrarca, PAC  ANESTHESIA:  General with supplemental femoral nerve block.  COMPLICATIONS:  None.  COMPONENTS:  DePuy LCS standard femoral component, #3 rotating keeled tibial tray with a 15-mm bridging bearing and metal-backed 3-peg rotating patella.  Components were secured with polymethyl methacrylate.  DESCRIPTION OF PROCEDURE:  Ms. Groesbeck was met in the holding area, identified the left knee as the appropriate operative site.  She did receive a preoperative femoral nerve block by Anesthesia.  The patient was then transported to room #7 and placed under general anesthesia without difficulty.  Nursing staff inserted a Foley catheter.  Urine was clear.  The tourniquet was then applied to the left thigh.  The left leg was then prepped with Betadine scrub and then DuraPrep from the tips of the toes to the tourniquet.  Sterile draping was performed.  With the extremity still elevated, Esmarch exsanguinated with a proximal tourniquet at 350 mmHg.  A midline longitudinal incision was then made, centered about the patella, extending from the superior pouch to the tibial tubercle.  Via sharp dissection, incision was carried down to the subcutaneous tissue. There was abundant adipose tissue.  Technically, the procedure was more difficult because of the patient's size.  First layer of capsule was incised in the midline and  medial parapatellar incision was then made with the Bovie.  There was a clear yellow joint effusion.  Patella was everted 180 degrees laterally, the knee flexed to 90 degrees.  There was a moderate amount of beefy-red synovitis.  A synovectomy was performed.  There were large osteophytes about the patella circumferentially and both medial and laterally about the femur. There was a little if any articular cartilage remaining along the medial femoral condyle and corresponding tibial plateau.  Osteophytes were removed.  I sized #3 standard femoral component.  First, bony cut was made transversely on the proximal tibia with a 7- degree angle of declination using the external tibial guide.  At each bony cut, I checked the alignment with the external alignment jig. Subsequent cuts were then made on the femur using the standard femoral jig and flexion extension gaps that appeared to be symmetrical at 12.5 mm.  Laminar spreaders were then inserted along the mediolateral compartment of the knee.  I removed medial and lateral menisci, ACL and PCL, and large fabella laterally, appeared to be impinging on the capsule and the subsequent prosthesis.  Osteophytes removed from the posterior femoral condyle using 3/4-inch curved osteotome.  Small bleeders were Bovie coagulated.  We again checked flexion-extension gaps and they were symmetrical at 12.5-mm final cut.  I did a 4-degree distal femoral cut, again with  symmetrical flexion-extension gaps.  The final femoral cut was then made to obtain tapering in the center holes.  Retractors were then placed around the tibia, was advanced anteriorly. We trialed #3 tibial tray.  This was pinned in place.  A center hole was made followed by the keeled cut.  With the trial tibial jig in place, we trialed a 12.5-mm bridging bearing in the standard femoral component. We thought we are a little loose with a varus valgus stress with the anterior drawer sign.   Then, we trialed a 15-mm component with slight full extension, excellent flexion.  No malrotation of the tibial tray and more stability, so we elected to proceed with the 12-mm bridging bearing.  Patella was then prepared by removing 10 mm of bone, leaving 13-mm patella thickness.  Trial patella jig was then applied, 3 holes made and trial patella inserted.  It did sublux laterally, so I performed a lateral release with the Bovie.  At that point, the patella remained stable midline.  Trial components were then removed.  The joint was copiously irrigated with saline solution.  Final components were impacted with polymethyl methacrylate.  Initially, applied the tibial tray followed by the 15-mm polyethylene bridging bearing on the standard component.  The components were impacted.  Knee was placed in extension.  Extraneous methacrylate was removed from the edges of the components.  Patella was applied with methacrylate and a patellar clamp.  At approximately 16 minutes, the methacrylate had matured and was hardened.  During which time, we injected 0.25% Marcaine with epinephrine into the joint edges into the deep capsule.  The joint was again copiously irrigated with saline solution.  At approximately 88 minutes, the tourniquet was deflated.  Gross bleeders were Bovie coagulated.  We sprayed thrombin around the joint, inserted a medium- sized Hemovac, thought we had nice hemostasis.  Deep capsule was closed with interrupted #1 Ethibond superficial capsule, closed with running 0 Vicryl, subcu with 3-0 Monocryl.  Skin was closed with skin clips. Sterile bulky dressing was applied followed by the patient's support stocking.  The patient tolerated the procedure without complications.     Claude Manges. Cleophas Dunker, M.D.     PWW/MEDQ  D:  07/01/2012  T:  07/01/2012  Job:  [id number]

## 2012-07-01 NOTE — H&P (Signed)
  The recent History & Physical has been reviewed. I have personally examined the patient today. There is no interval change to the documented History & Physical. The patient would like to proceed with the procedure.  Shelly [name] W 07/01/2012,  7:20 AM

## 2012-07-01 NOTE — Progress Notes (Signed)
Orthopedic Tech Progress Note Patient Details:  Shelly [name] Mar 18, 1940 [id number] CPM applied to Left LE with appropriate settings. Footsie roll provided. No OHF available at this time.  CPM Left Knee CPM Left Knee: On Left Knee Flexion (Degrees): 60 Left Knee Extension (Degrees): 0   Asia R Thompson 07/01/2012, 10:51 AM

## 2012-07-01 NOTE — Plan of Care (Signed)
Problem: Consults Goal: Diagnosis- Total Joint Replacement Primary Total Knee Left     

## 2012-07-01 NOTE — Preoperative (Signed)
Beta Blockers   Reason not to administer Beta Blockers:Carvedilol taken at 0430 hrs on 07/01/12

## 2012-07-02 ENCOUNTER — Encounter (HOSPITAL_COMMUNITY): Payer: Self-pay | Admitting: Orthopaedic Surgery

## 2012-07-02 LAB — GLUCOSE, CAPILLARY
Glucose-Capillary: 161 mg/dL — ABNORMAL HIGH (ref 70–99)
Glucose-Capillary: 167 mg/dL — ABNORMAL HIGH (ref 70–99)

## 2012-07-02 LAB — BASIC METABOLIC PANEL
CO2: 29 mEq/L (ref 19–32)
GFR calc non Af Amer: 84 mL/min — ABNORMAL LOW (ref >90)
Glucose, Bld: 172 mg/dL — ABNORMAL HIGH (ref 70–99)
Potassium: 3.9 mEq/L (ref 3.5–5.1)
Sodium: 136 mEq/L (ref 135–145)

## 2012-07-02 LAB — CBC
Hemoglobin: 11.1 g/dL — ABNORMAL LOW (ref 12.0–15.0)
RBC: 4.19 MIL/uL (ref 3.87–5.11)
WBC: 7 10*3/uL (ref 4.0–10.5)

## 2012-07-02 MED ORDER — OXYCODONE-ACETAMINOPHEN 5-325 MG PO TABS
1.0000 | ORAL_TABLET | ORAL | Status: DC | PRN
  Administered 2012-07-02 (×4): 2 via ORAL
  Administered 2012-07-03: 1 via ORAL
  Administered 2012-07-03: 2 via ORAL
  Filled 2012-07-02: qty 1
  Filled 2012-07-02 (×6): qty 2

## 2012-07-02 NOTE — Progress Notes (Signed)
Physical Therapy Treatment Patient Details Name: Shelly [name] MRN: [id number] DOB: 03-24-1940 Today's Date: 07/02/2012 Time: 0910-0929 PT Time Calculation (min): 19 min  PT Assessment / Plan / Recommendation Comments on Treatment Session  Pt making great progress with mobility.  Very motivated!  Increased ambulated ~80' this session.      Follow Up Recommendations  Home health PT;Supervision/Assistance - 24 hour     Does the patient have the potential to tolerate intense rehabilitation     Barriers to Discharge        Equipment Recommendations  None recommended by PT    Recommendations for Other Services    Frequency 7X/week   Plan Discharge plan remains appropriate    Precautions / Restrictions Precautions Precautions: Knee Restrictions LLE Weight Bearing: Partial weight bearing LLE Partial Weight Bearing Percentage or Pounds: 50    Pertinent Vitals/Pain 5/10 Lt knee.  Premedicated. Ice pack applied.      Mobility  Bed Mobility Bed Mobility: Not assessed Transfers Transfers: Sit to Stand;Stand to Sit Sit to Stand: 4: Min guard;With upper extremity assist;With armrests;From chair/3-in-1 Stand to Sit: 4: Min guard;With upper extremity assist;With armrests;To chair/3-in-1 Details for Transfer Assistance: Guarding for safety.  Cues for safe hand placement & use of UE's to control descent.  Ambulation/Gait Ambulation/Gait Assistance: 4: Min guard Ambulation Distance (Feet): 80 Feet Assistive device: Rolling walker Ambulation/Gait Assistance Details: cues for sequencing, increased upright posture, & reinforcement of UE's to unweigh L LE during stance phase to maintain PWB Gait Pattern: Step-to pattern;Decreased weight shift to left (decreased R LE step height.  ) Stairs: No Wheelchair Mobility Wheelchair Mobility: No    Exercises Total Joint Exercises Ankle Circles/Pumps: AROM;Both;10 reps Quad Sets: Strengthening;Both;10 reps Straight Leg Raises:  AAROM;Strengthening;Left;10 reps     PT Goals Acute Rehab PT Goals Time For Goal Achievement: 07/08/12 Potential to Achieve Goals: Good Pt will go Supine/Side to Sit: with modified independence;with HOB 0 degrees Pt will go Sit to Supine/Side: with modified independence;with HOB 0 degrees Pt will go Sit to Stand: with supervision PT Goal: Sit to Stand - Progress: Progressing toward goal Pt will go Stand to Sit: with supervision PT Goal: Stand to Sit - Progress: Progressing toward goal Pt will Ambulate: >150 feet;with supervision;with rolling walker PT Goal: Ambulate - Progress: Progressing toward goal Pt will Go Up / Down Stairs: 3-5 stairs;with rolling walker;with min assist  Visit Information  Last PT Received On: 07/02/12 Assistance Needed: +1    Subjective Data      Cognition  Cognition Arousal/Alertness: Awake/alert Behavior During Therapy: WFL for tasks assessed/performed Overall Cognitive Status: Within Functional Limits for tasks assessed    Balance     End of Session PT - End of Session Equipment Utilized During Treatment: Gait belt Activity Tolerance: Patient tolerated treatment well Patient left: in chair;with call bell/phone within reach Nurse Communication: Mobility status     Verdell Face, [place] [phone] 07/02/2012

## 2012-07-02 NOTE — Progress Notes (Signed)
OT Cancellation Note  Patient Details Name: Shelly [name] MRN: [id number] DOB: 1940-06-19   Cancelled Treatment:    Reason Eval/Treat Not Completed: Other (comment) (screen) Order received, reviewed chart, and spoke with pt. Pt politely stated she has no OT needs. Will sign off.    Earlie Raveling OTR/L [phone]  07/02/2012, 2:42 PM

## 2012-07-02 NOTE — Progress Notes (Signed)
Patient ID: Shelly [name], female   DOB: Jun 03, 1940, 72 y.o.   MRN: [id number] PATIENT ID: Shelly [name]        MRN:  [id number]          DOB/AGE: [birth year] / 72 y.o.    [name], MD   [name], PA-C [address] [place], [place]  [address]                             [phone]   PROGRESS NOTE  Subjective:  negative for Chest Pain  negative for Shortness of Breath  negative for Nausea/Vomiting   negative for Calf Pain    Tolerating Diet: yes         Patient reports pain as moderate and severe.     Up yesterday to chair.  Painful and would like to be changed to percocet.  Objective: Vital signs in last 24 hours:   Patient Vitals for the past 24 hrs:  BP Temp Pulse Resp SpO2 Height Weight  07/02/12 0600 116/63 mmHg 98.9 F (37.2 C) 80 16 96 % - -  07/02/12 0149 - - - - 93 % - -  07/02/12 0146 114/65 mmHg - 88 16 88 % - -  07/01/12 2257 130/60 mmHg 98.9 F (37.2 C) 82 18 95 % - -  07/01/12 1600 - - - 18 - - -  07/01/12 1540 - - - - 93 % - -  07/01/12 1500 - - - - - 5\' 6"  (1.676 m) 112.447 kg (247 lb 14.4 oz)  07/01/12 1415 140/69 mmHg 98.1 F (36.7 C) 91 - 94 % - -  07/01/12 1343 - 97.1 F (36.2 C) 88 21 96 % - -  07/01/12 1338 149/81 mmHg - - - - - -  07/01/12 1300 - - - 20 - - -  07/01/12 1252 133/64 mmHg - - - - - -  07/01/12 1215 - - 87 16 96 % - -  07/01/12 1207 132/63 mmHg - - - - - -  07/01/12 1145 - - 87 14 96 % - -  07/01/12 1125 130/67 mmHg - 93 20 96 % - -  07/01/12 1115 - - 91 19 96 % - -  07/01/12 1107 128/62 mmHg - - - - - -  07/01/12 1100 - - 90 16 96 % - -  07/01/12 1052 135/63 mmHg - - - - - -  07/01/12 1039 140/68 mmHg - 88 15 95 % - -  07/01/12 1026 - - 93 12 95 % - -  07/01/12 1022 140/71 mmHg - - - - - -  07/01/12 1015 - - 94 21 94 % - -  07/01/12 1008 - 98.4 F (36.9 C) - - - - -  07/01/12 1007 146/74 mmHg - - - - - -      Intake/Output from previous day:   05/20 0701 - 05/21 0700 In: 2942.5 [P.O.:300;  I.V.:2342.5] Out: 3030 [Urine:2420; Drains:535]   Intake/Output this shift:       Intake/Output     05/20 0701 - 05/21 0700 05/21 0701 - 05/22 0700   P.O. 300    I.V. (mL/kg) 2342.5 (20.8)    IV Piggyback 300    Total Intake(mL/kg) 2942.5 (26.2)    Urine (mL/kg/hr) 2420 (0.9)    Drains 535 (0.2)    Blood 75 (0)    Total Output 3030     Net -  87.5           Hemovac at 7am  LABORATORY DATA:  Recent Labs  06/25/12 1558 07/02/12 0436  WBC 6.5 7.0  HGB 13.4 11.1*  HCT 41.5 33.9*  PLT 276 213    Recent Labs  06/25/12 1558 07/02/12 0436  NA 141 136  K 4.2 3.9  CL 101 99  CO2 32 29  BUN 23 18  CREATININE 0.82 0.73  GLUCOSE 135* 172*  CALCIUM 9.5 8.3*   Lab Results  Component Value Date   INR 0.98 06/25/2012   INR 1.48 01/27/2009   INR 1.12 01/26/2009    Recent Radiographic Studies :  No results found.   Examination:  General appearance: alert, cooperative, mild distress and moderate distress Resp: clear to auscultation bilaterally Cardio: regular rate and rhythm GI: normal findings: bowel sounds normal  Wound Exam: clean, dry, intact dressing  Motor Exam: EHL, FHL, Anterior Tibial and Posterior Tibial Intact  Sensory Exam: Superficial Peroneal, Deep Peroneal and Tibial normal  Vascular Exam: Left dorsalis pedis artery has 2+ (normal) pulse  Assessment:    1 Day Post-Op  Procedure(s) (LRB): LEFT TOTAL KNEE ARTHROPLASTY (Left)  ADDITIONAL DIAGNOSIS:  Active Problems:   * No active hospital problems. *  Acute Blood Loss Anemia asymptomatic   Plan: Physical Therapy as ordered Partial Weight Bearing @ 50% (PWB)  DVT Prophylaxis:  Xarelto, Foot Pumps and TED hose  DISCHARGE PLAN: Home (wanted to go home today but planning for tomorrow.)  DISCHARGE NEEDS: HHPT, CPM, Walker and 3-in-1 comode seat         [facility] 07/02/2012 7:21 AM

## 2012-07-02 NOTE — Progress Notes (Signed)
PT is recommending home with HH and not SNF. CSW will make CM aware. Clinical Social Worker will sign off for now as social work intervention is no longer needed. Please consult us again if new need arises.   Neldon Shepard, MSW [phone] 

## 2012-07-02 NOTE — Progress Notes (Signed)
Physical Therapy Treatment Patient Details Name: Shelly [name] MRN: [id number] DOB: 1940-07-02 Today's Date: 07/02/2012 Time: 7829-5621 PT Time Calculation (min): 31 min  PT Assessment / Plan / Recommendation Comments on Treatment Session  Pt cont's to progress with mobility & PT goals.  If demonstrates consistency with mobility she will be ready for a safe d/c home tomorrow from PT standpoint.  Pt needs to practice steps before d/c home    Follow Up Recommendations  Home health PT;Supervision/Assistance - 24 hour     Does the patient have the potential to tolerate intense rehabilitation     Barriers to Discharge        Equipment Recommendations  None recommended by PT    Recommendations for Other Services    Frequency 7X/week   Plan Discharge plan remains appropriate    Precautions / Restrictions Precautions Precautions: Knee Restrictions LLE Weight Bearing: Partial weight bearing LLE Partial Weight Bearing Percentage or Pounds: 50       Mobility  Bed Mobility Bed Mobility: Sit to Supine Sit to Supine: 4: Min assist Details for Bed Mobility Assistance: Cues for use of UE's, technique.  (A) for LE's.   Transfers Transfers: Sit to Stand;Stand to Sit Sit to Stand: 4: Min guard;With upper extremity assist;With armrests;From chair/3-in-1;From toilet Stand to Sit: 4: Min guard;With upper extremity assist;To bed;To toilet Ambulation/Gait Ambulation/Gait Assistance: 4: Min guard Ambulation Distance (Feet): 120 Feet Assistive device: Rolling walker Ambulation/Gait Assistance Details: Cues to reinforce PWB, to increase step height with R LE Gait Pattern: Step-to pattern;Decreased step length - right;Decreased stance time - left;Decreased hip/knee flexion - right;Decreased hip/knee flexion - left Stairs: No Wheelchair Mobility Wheelchair Mobility: No    Exercises Total Joint Exercises Ankle Circles/Pumps: AROM;Both;10 reps Quad Sets: AROM;Both;5 reps Straight Leg  Raises: AAROM;Strengthening;Left;10 reps Knee Flexion: AAROM;Left;10 reps     PT Goals Acute Rehab PT Goals Time For Goal Achievement: 07/08/12 Potential to Achieve Goals: Good Pt will go Supine/Side to Sit: with modified independence;with HOB 0 degrees Pt will go Sit to Supine/Side: with modified independence;with HOB 0 degrees PT Goal: Sit to Supine/Side - Progress: Progressing toward goal Pt will go Sit to Stand: with supervision PT Goal: Sit to Stand - Progress: Progressing toward goal Pt will go Stand to Sit: with supervision PT Goal: Stand to Sit - Progress: Progressing toward goal Pt will Ambulate: >150 feet;with supervision;with rolling walker PT Goal: Ambulate - Progress: Progressing toward goal Pt will Go Up / Down Stairs: 3-5 stairs;with rolling walker;with min assist  Visit Information  Last PT Received On: 07/02/12 Assistance Needed: +1    Subjective Data      Cognition  Cognition Arousal/Alertness: Awake/alert Behavior During Therapy: WFL for tasks assessed/performed Overall Cognitive Status: Within Functional Limits for tasks assessed    Balance     End of Session PT - End of Session Equipment Utilized During Treatment: Gait belt Activity Tolerance: Patient tolerated treatment well Patient left: in bed;in CPM;with call bell/phone within reach Nurse Communication: Mobility status     Verdell Face, [place] [phone] 07/02/2012

## 2012-07-03 DIAGNOSIS — M1712 Unilateral primary osteoarthritis, left knee: Secondary | ICD-10-CM | POA: Diagnosis present

## 2012-07-03 DIAGNOSIS — M79609 Pain in unspecified limb: Secondary | ICD-10-CM

## 2012-07-03 LAB — CBC
Hemoglobin: 10.6 g/dL — ABNORMAL LOW (ref 12.0–15.0)
MCH: 26.4 pg (ref 26.0–34.0)
MCV: 81.8 fL (ref 78.0–100.0)
Platelets: 243 10*3/uL (ref 150–400)
RBC: 4.02 MIL/uL (ref 3.87–5.11)
WBC: 8.7 10*3/uL (ref 4.0–10.5)

## 2012-07-03 LAB — BASIC METABOLIC PANEL
CO2: 28 mEq/L (ref 19–32)
Calcium: 8.9 mg/dL (ref 8.4–10.5)
Chloride: 95 mEq/L — ABNORMAL LOW (ref 96–112)
Creatinine, Ser: 1.06 mg/dL (ref 0.50–1.10)
Glucose, Bld: 181 mg/dL — ABNORMAL HIGH (ref 70–99)

## 2012-07-03 LAB — GLUCOSE, CAPILLARY: Glucose-Capillary: 167 mg/dL — ABNORMAL HIGH (ref 70–99)

## 2012-07-03 MED ORDER — OXYCODONE-ACETAMINOPHEN 5-325 MG PO TABS: 1.0000 | ORAL_TABLET | ORAL | Status: DC | PRN

## 2012-07-03 MED ORDER — RIVAROXABAN 10 MG PO TABS: 10.0000 mg | ORAL_TABLET | ORAL | Status: DC

## 2012-07-03 MED ORDER — METHOCARBAMOL 500 MG PO TABS: 500.0000 mg | ORAL_TABLET | Freq: Three times a day (TID) | ORAL | Status: DC | PRN

## 2012-07-03 NOTE — Discharge Summary (Signed)
Shelly Code, PA-C Physician Assistant Incomplete Orthopedics Discharge Summaries Service date: 07/03/2012 8:25 AM   Shelly Campbell, MD              Shelly Code, PA-C [address] [place], [place]  [address]                             [phone]   PATIENT ID: Shelly [name]         MRN:  [id number]           DOB/AGE: 06/15/1940 / 72 y.o.                 DISCHARGE SUMMARY   ADMISSION DATE:    07/01/2012 DISCHARGE DATE:   07/03/2012    ADMISSION DIAGNOSIS: Left Knee Osteoarthritis     DISCHARGE DIAGNOSIS:  Left Knee Osteoarthritis     ADDITIONAL DIAGNOSIS: Principal Problem:   Osteoarthritis of left knee Past Medical History   Diagnosis  Date   .  Asthma         normal chest x-ray in 2010   .  Macular degeneration         "both eyes" (07/01/2012)   .  HTN (hypertension)     .  PUD (peptic ulcer disease)  2003       Upper GI bleed-gastric ulcer; gastroesophageal reflux disease   .  Cholelithiasis     .  Hyperlipidemia     .  Heart murmur         small   .  H/O hiatal hernia     .  Seasonal allergies     .  Hemorrhoid     .  GERD (gastroesophageal reflux disease)         history   .  Exertional shortness of breath         "mostly from being too fat" (07/01/2012)   .  Type II diabetes mellitus     .  Degenerative joint disease         Right TKA-2010; low back pain; hands and hips also affected   .  Arthritis         "qwhere" (07/01/2012)   .  Chronic lower back pain     .  Basal cell carcinoma of face        PROCEDURE: Procedure(s): LEFT TOTAL KNEE ARTHROPLASTY Lefton 07/01/2012   CONSULTS: none    HISTORY: Shelly [name] is a very pleasant, 72 year old white female who is seen today for evaluation of her left knee. She has had a right total knee arthroplasty in the past and has done extremely well with this. This was somewhere around 2008. However, she has had at least a year of pain in the left knee. She has also had some problems with her low back and  radicular-type symptoms and has been seen by Dr. Alvester Morin who has tried several injections but has not necessarily had a benefit. At that time it was discussed whether other surgical intervention would be indicated, and she chose at this time not to consider surgery for her back, but because of the chronic aching, throbbing, moderately severe pain in the left knee, she would like to consider surgical intervention. She does have pain with every step. She does use assistive devices as necessary. She certainly has had corticosteroid injections in the past. These appear to have become refractory. She has tried anti-inflammatories, including Celebrex.  HOSPITAL COURSE:  Shelly [name] is a 72 y.o. admitted on 07/01/2012 and found to have a diagnosis of Left Knee Osteoarthritis.  After appropriate laboratory studies were obtained  they were taken to the operating room on 07/01/2012 and underwent  Procedure(s): LEFT TOTAL KNEE ARTHROPLASTY Left.    They were given perioperative antibiotics:   Anti-infectives     Start        Dose/Rate  Route  Frequency  Ordered  Stop     07/01/12 2000    vancomycin (VANCOCIN) IVPB 1000 mg/200 mL premix        1,000 mg 200 mL/hr over 60 Minutes  Intravenous  Every 12 hours  07/01/12 1424  07/01/12 2157     07/01/12 0600    vancomycin (VANCOCIN) 1,500 mg in sodium chloride 0.9 % 500 mL IVPB        1,500 mg 250 mL/hr over 120 Minutes  Intravenous  On call to O.R.  06/30/12 1402  07/01/12 0725       .   Tolerated the procedure well.  Placed with a foley intraoperatively.  Given Ofirmev at induction and for 24 hours.      POD #1, allowed out of bed to a chair.  PT for ambulation and exercise program.  Foley D/C'd in morning.  IV saline locked.  O2 discontinued but used intermittently. Pain med changed to Percocet from Oxy IR.   POD #2, continued PT and ambulation.  Hemovac pulled. Doppler performed and was negative.  The remainder of the hospital course was  dedicated to ambulation and strengthening.   The patient was discharged on 2 Days Post-Op in  Stable condition.   Blood products given:none   DIAGNOSTIC STUDIES: Recent vital signs: Patient Vitals for the past 24 hrs:   BP  Temp  Pulse  Resp  SpO2   07/03/12 0630  133/56 mmHg  98.3 F (36.8 C)  76  18  98 %   07/02/12 2210  -  -  -  -  95 %   07/02/12 2203  123/55 mmHg  98.5 F (36.9 C)  92  18  88 %   07/02/12 1600  -  -  -  20  -   07/02/12 1400  116/55 mmHg  98.1 F (36.7 C)  93  16  100 %   07/02/12 1200  -  -  -  16  -   07/02/12 1025  132/51 mmHg  -  71  -  -          Recent laboratory studies: Recent Labs   07/02/12 0436  07/03/12 0520   WBC  7.0  8.7   HGB  11.1*  10.6*   HCT  33.9*  32.9*   PLT  213  243    Recent Labs   07/02/12 0436  07/03/12 0520   NA  136  133*   K  3.9  4.4   CL  99  95*   CO2  29  28   BUN  18  22   CREATININE  0.73  1.06   GLUCOSE  172*  181*   CALCIUM  8.3*  8.9    Lab Results   Component  Value  Date     INR  0.98  06/25/2012     INR  1.48  01/27/2009     INR  1.12  01/26/2009      Recent Radiographic Studies :  No results  found.   DISCHARGE INSTRUCTIONS: Discharge Orders     Future Appointments  Provider  Department  Dept Phone     07/14/2012 9:00 AM  Sherrie George, MD  [facility]  [phone]     Future Orders  Complete By  Expires        CPM   As directed         Comments:         Continuous passive motion machine (CPM):      Use the CPM from 0 to 60 for 6-8 hours per day.      You may increase by 5-10 per day.  You may break it up into 2 or 3 sessions per day.      Use CPM for 3-4 weeks or until you are told to stop.       Call MD / Call 911   As directed         Comments:         If you experience chest pain or shortness of breath, CALL 911 and be transported to the hospital emergency room.  If you develope a fever above 101 F, pus (white drainage) or increased drainage or redness  at the wound, or calf pain, call your surgeon's office.       Change dressing   As directed         Comments:         Change dressing on SUNDAY, then change the dressing daily with sterile 4 x 4 inch gauze dressing and apply TED hose.  You may clean the incision with alcohol prior to redressing.       Constipation Prevention   As directed         Comments:         Drink plenty of fluids.  Prune juice and/or coffee may be helpful. You may use a stool softener, such as Colace (over the counter) 100 mg twice a day.  Use MiraLax (over the counter) for constipation as needed but this may take several days to work.  Mag Citrate --OR-- Milk of Magnesia may also be used but follow directions on the label.       Diet Carb Modified   As directed         Do not put a pillow under the knee. Place it under the heel.   As directed         Driving restrictions   As directed         Comments:         No driving for 6 weeks       Increase activity slowly as tolerated   As directed         Lifting restrictions   As directed         Comments:         No lifting for 6 weeks       Partial weight bearing   As directed         Comments:         50  % WEIGHT BEARING AS TAUGHT IN PHYSICAL THERAPY       Patient may shower   As directed         Comments:         You may shower over the brown dressing.  Once the dressing is removed you may shower without a dressing once there is no drainage.  Do not wash  over the wound.  If drainage remains, cover wound with plastic wrap and then shower.       TED hose   As directed         Comments:         Use stockings (TED hose) for 2 weeks on operative leg(s).  You may remove them at night for sleeping.           DISCHARGE MEDICATIONS:       Medication List      STOP taking these medications           traMADol 50 MG tablet   Commonly known as:  ULTRAM          TAKE these medications           amLODipine 10 MG tablet   Commonly known as:  NORVASC   Take 10 mg by  mouth daily.         benazepril 40 MG tablet   Commonly known as:  LOTENSIN   Take 40 mg by mouth daily.         cetirizine 10 MG tablet   Commonly known as:  ZYRTEC   Take 10 mg by mouth daily.         clobetasol cream 0.05 %   Commonly known as:  TEMOVATE   Apply 1 application topically 2 (two) times daily as needed (for inflammation).         COREG 6.25 MG tablet   Generic drug:  carvedilol   Take 6.25 mg by mouth 2 (two) times daily.         flintstones complete 60 MG chewable tablet   Chew 1 tablet by mouth daily.         glipiZIDE 5 MG 24 hr tablet   Commonly known as:  GLUCOTROL XL   Take 5 mg by mouth every morning.         methocarbamol 500 MG tablet   Commonly known as:  ROBAXIN   Take 1 tablet (500 mg total) by mouth every 8 (eight) hours as needed (spasms).         montelukast 10 MG tablet   Commonly known as:  SINGULAIR   Take 10 mg by mouth as needed (for allergies).         oxyCODONE-acetaminophen 5-325 MG per tablet   Commonly known as:  PERCOCET/ROXICET   Take 1-2 tablets by mouth every 4 (four) hours as needed.         pravastatin 10 MG tablet   Commonly known as:  PRAVACHOL   Take 10 mg by mouth daily.         PRESERVISION AREDS PO   Take 1 tablet by mouth daily.         PROAIR HFA 108 (90 BASE) MCG/ACT inhaler   Generic drug:  albuterol   Inhale 2 puffs into the lungs every 6 (six) hours as needed for shortness of breath.         rivaroxaban 10 MG Tabs tablet   Commonly known as:  XARELTO   Take 1 tablet (10 mg total) by mouth daily.         sitaGLIPtan-metformin 50-1000 MG per tablet   Commonly known as:  JANUMET   Take 1 tablet by mouth 2 (two) times daily with a meal.         TRICOR 145 MG tablet   Generic drug:  fenofibrate   Take 145 mg by mouth daily.  VIGAMOX 0.5 % ophthalmic solution   Generic drug:  moxifloxacin   Place 1 drop into the right eye 3 (three) times daily. As directed for 3 days following eye  injection.             FOLLOW UP VISIT:          Follow-up Information     Follow up with PETRARCA,BRIAN, PA-C. Schedule an appointment as soon as possible for a visit on 07/16/2012.     Contact information:     [address] [place] [address] [phone]          DISPOSITION:   Home   CONDITION:  Stable     PETRARCA,BRIAN 07/03/2012, 8:26 AM

## 2012-07-03 NOTE — Care Management Note (Addendum)
    Page 1 of 2   07/03/2012     11:50:59 AM   CARE MANAGEMENT NOTE 07/03/2012  Patient:  Shelly [name], Shelly [name]   Account Number:  [id number]  Date Initiated:  07/03/2012  Documentation initiated by:  Letha Cape  Subjective/Objective Assessment:   dx left knee osteo s/p l tkr  admit- lives with family.     Action/Plan:   pt eval- rec hhpt  patient has DME with [employer] set up perioperatively.   Anticipated DC Date:  07/03/2012   Anticipated DC Plan:  HOME W HOME HEALTH SERVICES      DC Planning Services  CM consult      [facility]  HOME HEALTH   Choice offered to / List presented to:  C-1 Patient   DME arranged  CPM  BEDSIDE COM[facility]  WALKER - ROLLING      DME agency  [employer]     HH arranged  HH-2 PT      HH agency  [facility]   Status of service:  Completed, signed off Medicare Important Message given?   (If response is "NO", the following Medicare IM given date fields will be blank) Date Medicare IM given:   Date Additional Medicare IM given:    Discharge Disposition:  HOME W HOME HEALTH SERVICES  Per UR Regulation:  Reviewed for med. necessity/level of care/duration of stay  If discussed at Long Length of Stay Meetings, dates discussed:    Comments:  07/03/12 11:36 Letha Cape RN, BSN [phone] patient lives with family, she is s/p tkr , she has DME already from [employer] , she [facility] HHPT, referral made to [facility] , Marie notified.  Soc will begin 24-48 hrs post discharge.  Patient is for dc today.

## 2012-07-03 NOTE — Discharge Summary (Signed)
[name], MD   [name], PA-C [address], [place], [place]  [address]                             [phone]  PATIENT ID: Shelly [name]        MRN:  [id number]          DOB/AGE: 04/16/1940 / 72 y.o.    DISCHARGE SUMMARY  ADMISSION DATE:    07/01/2012 DISCHARGE DATE:   07/03/2012   ADMISSION DIAGNOSIS: Left Knee Osteoarthritis    DISCHARGE DIAGNOSIS:  Left Knee Osteoarthritis    ADDITIONAL DIAGNOSIS: Principal Problem:   Osteoarthritis of left knee  Past Medical History  Diagnosis Date  . Asthma     normal chest x-ray in 2010  . Macular degeneration     "both eyes" (07/01/2012)  . HTN (hypertension)   . PUD (peptic ulcer disease) 2003    Upper GI bleed-gastric ulcer; gastroesophageal reflux disease  . Cholelithiasis   . Hyperlipidemia   . Heart murmur     small  . H/O hiatal hernia   . Seasonal allergies   . Hemorrhoid   . GERD (gastroesophageal reflux disease)     history  . Exertional shortness of breath     "mostly from being too fat" (07/01/2012)  . Type II diabetes mellitus   . Degenerative joint disease     Right TKA-2010; low back pain; hands and hips also affected  . Arthritis     "qwhere" (07/01/2012)  . Chronic lower back pain   . Basal cell carcinoma of face     PROCEDURE: Procedure(s): LEFT TOTAL KNEE ARTHROPLASTY Lefton 07/01/2012  CONSULTS: none     HISTORY: Shelly [name] is a very pleasant, 72 year old white female who is seen today for evaluation of her left knee. She has had a right total knee arthroplasty in the past and has done extremely well with this. This was somewhere around 2008. However, she has had at least a year of pain in the left knee. She has also had some problems with her low back and radicular-type symptoms and has been seen by Dr. Alvester Morin who has tried several injections but has not necessarily had a benefit. At that time it was discussed whether other surgical intervention would be indicated, and she chose at this  time not to consider surgery for her back, but because of the chronic aching, throbbing, moderately severe pain in the left knee, she would like to consider surgical intervention. She does have pain with every step. She does use assistive devices as necessary. She certainly has had corticosteroid injections in the past. These appear to have become refractory. She has tried anti-inflammatories, including Celebrex.    HOSPITAL COURSE:  Shelly [name] is a 72 y.o. admitted on 07/01/2012 and found to have a diagnosis of Left Knee Osteoarthritis.  After appropriate laboratory studies were obtained  they were taken to the operating room on 07/01/2012 and underwent  Procedure(s): LEFT TOTAL KNEE ARTHROPLASTY Left.   They were given perioperative antibiotics:  Anti-infectives   Start     Dose/Rate Route Frequency Ordered Stop   07/01/12 2000  vancomycin (VANCOCIN) IVPB 1000 mg/200 mL premix     1,000 mg 200 mL/hr over 60 Minutes Intravenous Every 12 hours 07/01/12 1424 07/01/12 2157   07/01/12 0600  vancomycin (VANCOCIN) 1,500 mg in sodium chloride 0.9 % 500 mL IVPB     1,500 mg 250 mL/hr  over 120 Minutes Intravenous On call to O.R. 06/30/12 1402 07/01/12 0725    .  Tolerated the procedure well.  Placed with a foley intraoperatively.  Given Ofirmev at induction and for 24 hours.     POD #1, allowed out of bed to a chair.  PT for ambulation and exercise program.  Foley D/C'd in morning.  IV saline locked.  O2 discontinued but used intermittently. Pain med changed to Percocet from Oxy IR.  POD #2, continued PT and ambulation.  Hemovac pulled. Doppler performed and was negative  The remainder of the hospital course was dedicated to ambulation and strengthening.   The patient was discharged on 2 Days Post-Op in  Stable condition.  Blood products given:none  DIAGNOSTIC STUDIES: Recent vital signs: Patient Vitals for the past 24 hrs:  BP Temp Pulse Resp SpO2  07/03/12 0630 133/56 mmHg 98.3 F (36.8  C) 76 18 98 %  07/02/12 2210 - - - - 95 %  07/02/12 2203 123/55 mmHg 98.5 F (36.9 C) 92 18 88 %  07/02/12 1600 - - - 20 -  07/02/12 1400 116/55 mmHg 98.1 F (36.7 C) 93 16 100 %  07/02/12 1200 - - - 16 -  07/02/12 1025 132/51 mmHg - 71 - -       Recent laboratory studies:  Recent Labs  07/02/12 0436 07/03/12 0520  WBC 7.0 8.7  HGB 11.1* 10.6*  HCT 33.9* 32.9*  PLT 213 243    Recent Labs  07/02/12 0436 07/03/12 0520  NA 136 133*  K 3.9 4.4  CL 99 95*  CO2 29 28  BUN 18 22  CREATININE 0.73 1.06  GLUCOSE 172* 181*  CALCIUM 8.3* 8.9   Lab Results  Component Value Date   INR 0.98 06/25/2012   INR 1.48 01/27/2009   INR 1.12 01/26/2009     Recent Radiographic Studies :  No results found.  DISCHARGE INSTRUCTIONS: Discharge Orders   Future Appointments Provider Department Dept Phone   07/14/2012 9:00 AM Sherrie George, MD [facility] [phone]   Future Orders Complete By Expires     CPM  As directed     Comments:      Continuous passive motion machine (CPM):      Use the CPM from 0 to 60 for 6-8 hours per day.      You may increase by 5-10 per day.  You may break it up into 2 or 3 sessions per day.      Use CPM for 3-4 weeks or until you are told to stop.    Call MD / Call 911  As directed     Comments:      If you experience chest pain or shortness of breath, CALL 911 and be transported to the hospital emergency room.  If you develope a fever above 101 F, pus (white drainage) or increased drainage or redness at the wound, or calf pain, call your surgeon's office.    Change dressing  As directed     Comments:      Change dressing on SUNDAY, then change the dressing daily with sterile 4 x 4 inch gauze dressing and apply TED hose.  You may clean the incision with alcohol prior to redressing.    Constipation Prevention  As directed     Comments:      Drink plenty of fluids.  Prune juice and/or coffee may be helpful.  You may use a stool  softener, such as Colace (over the counter) 100 mg twice a day.  Use MiraLax (over the counter) for constipation as needed but this may take several days to work.  Mag Citrate --OR-- Milk of Magnesia may also be used but follow directions on the label.    Diet Carb Modified  As directed     Do not put a pillow under the knee. Place it under the heel.  As directed     Driving restrictions  As directed     Comments:      No driving for 6 weeks    Increase activity slowly as tolerated  As directed     Lifting restrictions  As directed     Comments:      No lifting for 6 weeks    Partial weight bearing  As directed     Comments:      50 % WEIGHT BEARING AS TAUGHT IN PHYSICAL THERAPY    Patient may shower  As directed     Comments:      You may shower over the brown dressing.  Once the dressing is removed you may shower without a dressing once there is no drainage.  Do not wash over the wound.  If drainage remains, cover wound with plastic wrap and then shower.    TED hose  As directed     Comments:      Use stockings (TED hose) for 2 weeks on operative leg(s).  You may remove them at night for sleeping.       DISCHARGE MEDICATIONS:     Medication List    STOP taking these medications       traMADol 50 MG tablet  Commonly known as:  ULTRAM      TAKE these medications       amLODipine 10 MG tablet  Commonly known as:  NORVASC  Take 10 mg by mouth daily.     benazepril 40 MG tablet  Commonly known as:  LOTENSIN  Take 40 mg by mouth daily.     cetirizine 10 MG tablet  Commonly known as:  ZYRTEC  Take 10 mg by mouth daily.     clobetasol cream 0.05 %  Commonly known as:  TEMOVATE  Apply 1 application topically 2 (two) times daily as needed (for inflammation).     COREG 6.25 MG tablet  Generic drug:  carvedilol  Take 6.25 mg by mouth 2 (two) times daily.     flintstones complete 60 MG chewable tablet  Chew 1 tablet by mouth daily.     glipiZIDE 5 MG 24 hr tablet    Commonly known as:  GLUCOTROL XL  Take 5 mg by mouth every morning.     methocarbamol 500 MG tablet  Commonly known as:  ROBAXIN  Take 1 tablet (500 mg total) by mouth every 8 (eight) hours as needed (spasms).     montelukast 10 MG tablet  Commonly known as:  SINGULAIR  Take 10 mg by mouth as needed (for allergies).     oxyCODONE-acetaminophen 5-325 MG per tablet  Commonly known as:  PERCOCET/ROXICET  Take 1-2 tablets by mouth every 4 (four) hours as needed.     pravastatin 10 MG tablet  Commonly known as:  PRAVACHOL  Take 10 mg by mouth daily.     PRESERVISION AREDS PO  Take 1 tablet by mouth daily.     PROAIR HFA 108 (90 BASE) MCG/ACT inhaler  Generic drug:  albuterol  Inhale  2 puffs into the lungs every 6 (six) hours as needed for shortness of breath.     rivaroxaban 10 MG Tabs tablet  Commonly known as:  XARELTO  Take 1 tablet (10 mg total) by mouth daily.     sitaGLIPtan-metformin 50-1000 MG per tablet  Commonly known as:  JANUMET  Take 1 tablet by mouth 2 (two) times daily with a meal.     TRICOR 145 MG tablet  Generic drug:  fenofibrate  Take 145 mg by mouth daily.     VIGAMOX 0.5 % ophthalmic solution  Generic drug:  moxifloxacin  Place 1 drop into the right eye 3 (three) times daily. As directed for 3 days following eye injection.        FOLLOW UP VISIT:       Follow-up Information   Follow up with PETRARCA,BRIAN, PA-C. Schedule an appointment as soon as possible for a visit on 07/16/2012.   Contact information:   [address] [place] [address] [phone]       DISPOSITION:   Home  CONDITION:  Stable   PETRARCA,BRIAN 07/03/2012, 8:26 AM

## 2012-07-03 NOTE — Progress Notes (Signed)
Shelly ID: SHAELEE FORNI, female   DOB: Oct 09, 1940, 72 y.o.   MRN: [id number] Shelly [name]        MRN:  [id number]          DOB/AGE: 1940-09-09 / 72 y.o.    Shelly Campbell, MD   Shelly Code, PA-C [address] [place], [place]  [address]                             [phone]   PROGRESS NOTE  Subjective:  negative for Chest Pain  negative for Shortness of Breath  negative for Nausea/Vomiting   negative for Calf Pain    Tolerating Diet: yes         Shelly reports pain as mild and moderate.     Pain controlled with percocet.  Wants to go home  Objective: Vital signs in last 24 hours:   Shelly Vitals for the past 24 hrs:  BP Temp Pulse Resp SpO2  07/03/12 0630 133/56 mmHg 98.3 F (36.8 C) 76 18 98 %  07/02/12 2210 - - - - 95 %  07/02/12 2203 123/55 mmHg 98.5 F (36.9 C) 92 18 88 %  07/02/12 1600 - - - 20 -  07/02/12 1400 116/55 mmHg 98.1 F (36.7 C) 93 16 100 %  07/02/12 1200 - - - 16 -  07/02/12 1025 132/51 mmHg - 71 - -      Intake/Output from previous day:   05/21 0701 - 05/22 0700 In: 490 [P.O.:440] Out: 40 [Drains:40]   Intake/Output this shift:       Intake/Output     05/21 0701 - 05/22 0700 05/22 0701 - 05/23 0700   P.O. 440    I.V. (mL/kg)     Other 50    IV Piggyback     Total Intake(mL/kg) 490 (4.4)    Urine (mL/kg/hr)     Drains 40 (0)    Blood     Total Output 40     Net +450          Urine Occurrence 5 x       LABORATORY DATA:  Recent Labs  07/02/12 0436 07/03/12 0520  WBC 7.0 8.7  HGB 11.1* 10.6*  HCT 33.9* 32.9*  PLT 213 243    Recent Labs  07/02/12 0436 07/03/12 0520  NA 136 133*  K 3.9 4.4  CL 99 95*  CO2 29 28  BUN 18 22  CREATININE 0.73 1.06  GLUCOSE 172* 181*  CALCIUM 8.3* 8.9   Lab Results  Component Value Date   INR 0.98 06/25/2012   INR 1.48 01/27/2009   INR 1.12 01/26/2009    Recent Radiographic Studies :  No results found.   Examination:  General appearance: alert,  cooperative, mild distress and moderate distress Resp: clear to auscultation bilaterally Cardio: regular rate and rhythm GI: normal findings: bowel sounds normal  Wound Exam: clean, dry, intact   Drainage:  None: wound tissue dry  Motor Exam: EHL, FHL, Anterior Tibial and Posterior Tibial Intact  Sensory Exam: Superficial Peroneal, Deep Peroneal and Tibial normal  Vascular Exam: Left dorsalis pedis artery has 1+ (weak) pulse  Calf mildly tender which she had some prior to surgery  Assessment:    2 Days Post-Op  Procedure(s) (LRB): LEFT TOTAL KNEE ARTHROPLASTY (Left)  ADDITIONAL DIAGNOSIS:  Active Problems:   * No active hospital problems. *  Acute Blood Loss Anemia, Hyponatremia and  Diabetes   Plan: Physical Therapy as ordered Partial Weight Bearing @ 50% (PWB)  DVT Prophylaxis:  Xarelto, Foot Pumps and TED hose  DISCHARGE PLAN: Home  DISCHARGE NEEDS: HHPT, CPM, Walker and 3-in-1 comode seat  Home today if doppler negative         Shelly [name] 07/03/2012 8:08 AM

## 2012-07-03 NOTE — Progress Notes (Signed)
*  Preliminary Results* Left lower extremity venous duplex completed. Left lower extremity is negative for deep vein thrombosis. There is no evidence of left Baker's cyst.  07/03/2012 10:39 AM Gertie Fey, RDMS, RDCS

## 2012-07-03 NOTE — Progress Notes (Signed)
RN discussed discharge instructions with patient and daughter. All questions answered. Prescriptions given.

## 2012-07-03 NOTE — Progress Notes (Signed)
Physical Therapy Treatment Patient Details Name: Shelly [name] MRN: [id number] DOB: November 23, 1940 Today's Date: 07/03/2012 Time: 1478-2956 PT Time Calculation (min): 31 min  PT Assessment / Plan / Recommendation Comments on Treatment Session  Patient continues to make progress. Completed stair training. Will ambulate and review HEP this afternoon prior to patient discharging home    Follow Up Recommendations  Home health PT;Supervision/Assistance - 24 hour     Does the patient have the potential to tolerate intense rehabilitation     Barriers to Discharge        Equipment Recommendations  None recommended by PT    Recommendations for Other Services    Frequency 7X/week   Plan Discharge plan remains appropriate    Precautions / Restrictions Precautions Precautions: Knee Restrictions LLE Weight Bearing: Partial weight bearing LLE Partial Weight Bearing Percentage or Pounds: 50   Pertinent Vitals/Pain     Mobility  Bed Mobility Bed Mobility: Not assessed Transfers Sit to Stand: 4: Min guard;With upper extremity assist;With armrests;From chair/3-in-1;From toilet Stand to Sit: 4: Min guard;With upper extremity assist;To toilet;To chair/3-in-1 Details for Transfer Assistance: Guarding for safety.  Cues for safe hand placement & use of UE's to control descent.  Ambulation/Gait Ambulation/Gait Assistance: 5: Supervision Ambulation Distance (Feet): 220 Feet Assistive device: Rolling walker Ambulation/Gait Assistance Details: Cues to ensure PWB Gait Pattern: Step-to pattern Stairs: Yes Stairs Assistance: 4: Min assist Stairs Assistance Details (indicate cue type and reason): A for RW. Cues for technique Stair Management Technique: Backwards;With walker Number of Stairs: 2    Exercises     PT Diagnosis:    PT Problem List:   PT Treatment Interventions:     PT Goals Acute Rehab PT Goals PT Goal: Sit to Stand - Progress: Progressing toward goal PT Goal: Stand to Sit -  Progress: Progressing toward goal PT Goal: Ambulate - Progress: Progressing toward goal PT Goal: Up/Down Stairs - Progress: Progressing toward goal  Visit Information  Last PT Received On: 07/03/12 Assistance Needed: +1    Subjective Data      Cognition  Cognition Arousal/Alertness: Awake/alert Behavior During Therapy: WFL for tasks assessed/performed Overall Cognitive Status: Within Functional Limits for tasks assessed    Balance     End of Session PT - End of Session Equipment Utilized During Treatment: Gait belt Activity Tolerance: Patient tolerated treatment well Patient left: in chair;with call bell/phone within reach   GP     Fredrich Birks 07/03/2012, 8:52 AM 07/03/2012 Fredrich Birks PTA [phone] pager [phone] office

## 2012-07-14 ENCOUNTER — Encounter (INDEPENDENT_AMBULATORY_CARE_PROVIDER_SITE_OTHER): Payer: Medicare Other | Admitting: Ophthalmology

## 2012-07-14 DIAGNOSIS — H43819 Vitreous degeneration, unspecified eye: Secondary | ICD-10-CM

## 2012-07-14 DIAGNOSIS — I10 Essential (primary) hypertension: Secondary | ICD-10-CM

## 2012-07-14 DIAGNOSIS — H353290 Exudative age-related macular degeneration, unspecified eye, stage unspecified: Secondary | ICD-10-CM

## 2012-07-14 DIAGNOSIS — H3530 Unspecified macular degeneration: Secondary | ICD-10-CM

## 2012-07-14 DIAGNOSIS — E1139 Type 2 diabetes mellitus with other diabetic ophthalmic complication: Secondary | ICD-10-CM

## 2012-07-14 DIAGNOSIS — H35039 Hypertensive retinopathy, unspecified eye: Secondary | ICD-10-CM

## 2012-07-14 DIAGNOSIS — E11319 Type 2 diabetes mellitus with unspecified diabetic retinopathy without macular edema: Secondary | ICD-10-CM

## 2012-11-17 ENCOUNTER — Encounter (INDEPENDENT_AMBULATORY_CARE_PROVIDER_SITE_OTHER): Payer: Medicare Other | Admitting: Ophthalmology

## 2012-11-17 DIAGNOSIS — H3530 Unspecified macular degeneration: Secondary | ICD-10-CM

## 2012-11-17 DIAGNOSIS — H353290 Exudative age-related macular degeneration, unspecified eye, stage unspecified: Secondary | ICD-10-CM

## 2012-11-17 DIAGNOSIS — I10 Essential (primary) hypertension: Secondary | ICD-10-CM

## 2012-11-17 DIAGNOSIS — H35039 Hypertensive retinopathy, unspecified eye: Secondary | ICD-10-CM

## 2012-11-17 DIAGNOSIS — H43819 Vitreous degeneration, unspecified eye: Secondary | ICD-10-CM

## 2012-11-17 DIAGNOSIS — E11319 Type 2 diabetes mellitus with unspecified diabetic retinopathy without macular edema: Secondary | ICD-10-CM

## 2012-11-17 DIAGNOSIS — E1139 Type 2 diabetes mellitus with other diabetic ophthalmic complication: Secondary | ICD-10-CM

## 2012-11-27 ENCOUNTER — Other Ambulatory Visit (HOSPITAL_COMMUNITY): Payer: Self-pay | Admitting: Internal Medicine

## 2012-11-27 DIAGNOSIS — Z139 Encounter for screening, unspecified: Secondary | ICD-10-CM

## 2012-12-08 ENCOUNTER — Ambulatory Visit (HOSPITAL_COMMUNITY)
Admission: RE | Admit: 2012-12-08 | Discharge: 2012-12-08 | Disposition: A | Discharge status: Home / Self Care | Payer: Medicare Other | Source: Ambulatory Visit | Attending: Internal Medicine | Admitting: Internal Medicine

## 2012-12-08 DIAGNOSIS — Z139 Encounter for screening, unspecified: Secondary | ICD-10-CM

## 2012-12-08 DIAGNOSIS — Z1231 Encounter for screening mammogram for malignant neoplasm of breast: Secondary | ICD-10-CM | POA: Insufficient documentation

## 2012-12-11 ENCOUNTER — Encounter (INDEPENDENT_AMBULATORY_CARE_PROVIDER_SITE_OTHER): Payer: Self-pay | Admitting: *Deleted

## 2013-03-23 ENCOUNTER — Encounter (INDEPENDENT_AMBULATORY_CARE_PROVIDER_SITE_OTHER): Payer: Medicare Other | Admitting: Ophthalmology

## 2013-03-23 DIAGNOSIS — E1165 Type 2 diabetes mellitus with hyperglycemia: Secondary | ICD-10-CM

## 2013-03-23 DIAGNOSIS — E11319 Type 2 diabetes mellitus with unspecified diabetic retinopathy without macular edema: Secondary | ICD-10-CM

## 2013-03-23 DIAGNOSIS — H35039 Hypertensive retinopathy, unspecified eye: Secondary | ICD-10-CM

## 2013-03-23 DIAGNOSIS — I10 Essential (primary) hypertension: Secondary | ICD-10-CM

## 2013-03-23 DIAGNOSIS — E1139 Type 2 diabetes mellitus with other diabetic ophthalmic complication: Secondary | ICD-10-CM

## 2013-03-23 DIAGNOSIS — H3530 Unspecified macular degeneration: Secondary | ICD-10-CM

## 2013-05-14 ENCOUNTER — Encounter: Payer: Self-pay | Admitting: Cardiology

## 2013-05-14 ENCOUNTER — Ambulatory Visit (INDEPENDENT_AMBULATORY_CARE_PROVIDER_SITE_OTHER): Payer: Medicare Other | Admitting: Cardiology

## 2013-05-14 VITALS — BP 123/58 | HR 87 | Ht 66.0 in | Wt 241.0 lb

## 2013-05-14 DIAGNOSIS — R011 Cardiac murmur, unspecified: Secondary | ICD-10-CM

## 2013-05-14 DIAGNOSIS — E785 Hyperlipidemia, unspecified: Secondary | ICD-10-CM

## 2013-05-14 DIAGNOSIS — I10 Essential (primary) hypertension: Secondary | ICD-10-CM

## 2013-05-14 NOTE — Patient Instructions (Signed)
Your physician wants you to follow-up in: 1 year  You will receive a reminder letter in the mail two months in advance. If you don't receive a letter, please call our office to schedule the follow-up appointment.   Your physician has requested that you have an echocardiogram. Echocardiography is a painless test that uses sound waves to create images of your heart. It provides your doctor with information about the size and shape of your heart and how well your heart's chambers and valves are working. This procedure takes approximately one hour. There are no restrictions for this procedure.     Your physician recommends that you continue on your current medications as directed. Please refer to the Current Medication list given to you today.    Thank you for choosing [facility] !

## 2013-05-14 NOTE — Progress Notes (Signed)
Clinical Summary Shelly [name] is a 73 y.o.female former patient of Dr Shelly [name], this is our first visit together. She is seen for the following medical problems.    1. HTN - does not check bp regularly at home - compliant with meds  2. Chronic SOB - attributed to obesity per prior notes, notes improved with some weight loss  3. Hyperlipidemia  - compliant with statin - followed by Dr Shelly [name], no recent panel in our system     Past Medical History  Diagnosis Date  . Asthma     normal chest x-ray in 2010  . Macular degeneration     "both eyes" (07/01/2012)  . HTN (hypertension)   . PUD (peptic ulcer disease) 2003    Upper GI bleed-gastric ulcer; gastroesophageal reflux disease  . Cholelithiasis   . Hyperlipidemia   . Heart murmur     small  . H/O hiatal hernia   . Seasonal allergies   . Hemorrhoid   . GERD (gastroesophageal reflux disease)     history  . Exertional shortness of breath     "mostly from being too fat" (07/01/2012)  . Type II diabetes mellitus   . Degenerative joint disease     Right TKA-2010; low back pain; hands and hips also affected  . Arthritis     "qwhere" (07/01/2012)  . Chronic lower back pain   . Basal cell carcinoma of face      Allergies  Allergen Reactions  . Celebrex [Celecoxib] Other (See Comments)    Abdominal pain; history of upper GI bleed   . Claritin [Loratadine] Hives and Swelling    blisters  . Penicillins Hives     Current Outpatient Prescriptions  Medication Sig Dispense Refill  . albuterol (PROAIR HFA) 108 (90 BASE) MCG/ACT inhaler Inhale 2 puffs into the lungs every 6 (six) hours as needed for shortness of breath.       Marland Kitchen amLODipine (NORVASC) 10 MG tablet Take 10 mg by mouth daily.        . benazepril (LOTENSIN) 40 MG tablet Take 40 mg by mouth daily.        . carvedilol (COREG) 6.25 MG tablet Take 6.25 mg by mouth 2 (two) times daily.        . cetirizine (ZYRTEC) 10 MG tablet Take 10 mg by mouth daily.       .  clobetasol (TEMOVATE) 0.05 % cream Apply 1 application topically 2 (two) times daily as needed (for inflammation).       . flintstones complete (FLINTSTONES) 60 MG chewable tablet Chew 1 tablet by mouth daily.      Marland Kitchen glipiZIDE (GLUCOTROL XL) 5 MG 24 hr tablet Take 5 mg by mouth every morning.       . methocarbamol (ROBAXIN) 500 MG tablet Take 1 tablet (500 mg total) by mouth every 8 (eight) hours as needed (spasms).  40 tablet  0  . montelukast (SINGULAIR) 10 MG tablet Take 10 mg by mouth as needed (for allergies).       . moxifloxacin (VIGAMOX) 0.5 % ophthalmic solution Place 1 drop into the right eye 3 (three) times daily. As directed for 3 days following eye injection.      . Multiple Vitamins-Minerals (PRESERVISION AREDS PO) Take 1 tablet by mouth daily.       Marland Kitchen oxyCODONE-acetaminophen (PERCOCET/ROXICET) 5-325 MG per tablet Take 1-2 tablets by mouth every 4 (four) hours as needed.  90 tablet  0  . pravastatin (PRAVACHOL)  10 MG tablet Take 10 mg by mouth daily.      . rivaroxaban (XARELTO) 10 MG TABS tablet Take 1 tablet (10 mg total) by mouth daily.  14 tablet  0  . sitaGLIPtan-metformin (JANUMET) 50-1000 MG per tablet Take 1 tablet by mouth 2 (two) times daily with a meal.      . TRICOR 145 MG tablet Take 145 mg by mouth daily.        No current facility-administered medications for this visit.     Past Surgical History  Procedure Laterality Date  . Appendectomy    . Cataract extraction bilateral w/ anterior vitrectomy Bilateral   . Knee arthroscopy Bilateral   . Total knee arthroplasty Right 2011  . Eye surgery Bilateral     "laser in eyes to stop bleeding and injections for macular degeneration" (07/01/2012)  . Foot surgery Bilateral     straighten 1st toe with a wedge.  . Total knee arthroplasty Left 07/01/2012  . Skin cancer excision  2013    "face" (07/01/2012)  . Total knee arthroplasty Left 07/01/2012    Procedure: LEFT TOTAL KNEE ARTHROPLASTY;  Surgeon: Garald Balding, MD;   Location: [facility];  Service: Orthopedics;  Laterality: Left;  Left Total Knee Arthroplasty     Allergies  Allergen Reactions  . Celebrex [Celecoxib] Other (See Comments)    Abdominal pain; history of upper GI bleed   . Claritin [Loratadine] Hives and Swelling    blisters  . Penicillins Hives      Family History  Problem Relation Age of Onset  . Hodgkin's lymphoma Mother      Social History Shelly [name] reports that she quit smoking about 32 years ago. Her smoking use included Cigarettes. She has a 67.5 pack-year smoking history. She has never used smokeless tobacco. Shelly [name] reports that she does not drink alcohol.   Review of Systems CONSTITUTIONAL: No weight loss, fever, chills, weakness or fatigue.  HEENT: Eyes: No visual loss, blurred vision, double vision or yellow sclerae.No hearing loss, sneezing, congestion, runny nose or sore throat.  SKIN: No rash or itching.  CARDIOVASCULAR: per HPI RESPIRATORY: No shortness of breath, cough or sputum.  GASTROINTESTINAL: No anorexia, nausea, vomiting or diarrhea. No abdominal pain or blood.  GENITOURINARY: No burning on urination, no polyuria NEUROLOGICAL: No headache, dizziness, syncope, paralysis, ataxia, numbness or tingling in the extremities. No change in bowel or bladder control.  MUSCULOSKELETAL: No muscle, back pain, joint pain or stiffness.  LYMPHATICS: No enlarged nodes. No history of splenectomy.  PSYCHIATRIC: No history of depression or anxiety.  ENDOCRINOLOGIC: No reports of sweating, cold or heat intolerance. No polyuria or polydipsia.  Marland Kitchen   Physical Examination p 87 bp 123/58 Wt 241 lbs BMI 39 Gen: resting comfortably, no acute distress HEENT: no scleral icterus, pupils equal round and reactive, no palptable cervical adenopathy,  CV: RRR, 2/6 systolic murmur RUSB, no JVD, no carotid bruits Resp: Clear to auscultation bilaterally GI: abdomen is soft, non-tender, non-distended, normal bowel sounds, no  hepatosplenomegaly MSK: extremities are warm, no edema.  Skin: warm, no rash Neuro:  no focal deficits Psych: appropriate affect   Diagnostic Studies  05/2012 Carotid US IMPRESSION:  Minimal amount of bilateral intimal wall thickening, not definitely resulting in hemodynamically significant stenosis.   06/2012 Venous LE [place] No evidence of deep vein thrombosis involving the left lower extremity. - No evidence of Baker's cyst on the left. Other specific details can be found in the table(s) above. Prepared and  Electronically Authenticated by    Assessment and Plan   1. HTN - at goal, continue current meds  2. Chronic SOB - no previous cardiac etiology found per previous clinic notes - symptoms improving with weight loss - continue to follow  3. Hyperlipidemia - no recent panel in our system, will request from pcp - continue current statin. Goal LDL given hx of DM is <70  4. Heart murmur - obtain echo to further evaluate.   F/u 1 year  Arnoldo Lenis, M.D., F.A.C.C.

## 2013-05-21 ENCOUNTER — Ambulatory Visit (HOSPITAL_COMMUNITY)
Admission: RE | Admit: 2013-05-21 | Discharge: 2013-05-21 | Disposition: A | Discharge status: Home / Self Care | Payer: Medicare Other | Source: Ambulatory Visit | Attending: Cardiology | Admitting: Cardiology

## 2013-05-21 ENCOUNTER — Other Ambulatory Visit (HOSPITAL_COMMUNITY): Payer: Medicare Other

## 2013-05-21 DIAGNOSIS — E669 Obesity, unspecified: Secondary | ICD-10-CM | POA: Insufficient documentation

## 2013-05-21 DIAGNOSIS — I10 Essential (primary) hypertension: Secondary | ICD-10-CM | POA: Insufficient documentation

## 2013-05-21 DIAGNOSIS — E785 Hyperlipidemia, unspecified: Secondary | ICD-10-CM | POA: Insufficient documentation

## 2013-05-21 DIAGNOSIS — E119 Type 2 diabetes mellitus without complications: Secondary | ICD-10-CM | POA: Insufficient documentation

## 2013-05-21 DIAGNOSIS — R0989 Other specified symptoms and signs involving the circulatory and respiratory systems: Secondary | ICD-10-CM | POA: Insufficient documentation

## 2013-05-21 DIAGNOSIS — R011 Cardiac murmur, unspecified: Secondary | ICD-10-CM | POA: Insufficient documentation

## 2013-05-21 DIAGNOSIS — Z6838 Body mass index (BMI) 38.0-38.9, adult: Secondary | ICD-10-CM | POA: Insufficient documentation

## 2013-05-21 DIAGNOSIS — Z87891 Personal history of nicotine dependence: Secondary | ICD-10-CM | POA: Insufficient documentation

## 2013-05-21 DIAGNOSIS — I517 Cardiomegaly: Secondary | ICD-10-CM

## 2013-05-21 DIAGNOSIS — R0609 Other forms of dyspnea: Secondary | ICD-10-CM | POA: Insufficient documentation

## 2013-05-21 NOTE — Progress Notes (Signed)
*  PRELIMINARY RESULTS* Echocardiogram 2D Echocardiogram has been performed.  Su Grand Shareena Nusz 05/21/2013, 11:16 AM

## 2013-07-20 ENCOUNTER — Encounter (INDEPENDENT_AMBULATORY_CARE_PROVIDER_SITE_OTHER): Payer: Medicare Other | Admitting: Ophthalmology

## 2013-07-20 DIAGNOSIS — E1165 Type 2 diabetes mellitus with hyperglycemia: Secondary | ICD-10-CM

## 2013-07-20 DIAGNOSIS — E1139 Type 2 diabetes mellitus with other diabetic ophthalmic complication: Secondary | ICD-10-CM

## 2013-07-20 DIAGNOSIS — E11319 Type 2 diabetes mellitus with unspecified diabetic retinopathy without macular edema: Secondary | ICD-10-CM

## 2013-07-20 DIAGNOSIS — I10 Essential (primary) hypertension: Secondary | ICD-10-CM

## 2013-07-20 DIAGNOSIS — H35039 Hypertensive retinopathy, unspecified eye: Secondary | ICD-10-CM

## 2013-07-20 DIAGNOSIS — H3530 Unspecified macular degeneration: Secondary | ICD-10-CM

## 2013-07-20 DIAGNOSIS — H43819 Vitreous degeneration, unspecified eye: Secondary | ICD-10-CM

## 2013-11-23 ENCOUNTER — Encounter (INDEPENDENT_AMBULATORY_CARE_PROVIDER_SITE_OTHER): Payer: Medicare Other | Admitting: Ophthalmology

## 2013-11-23 DIAGNOSIS — H35033 Hypertensive retinopathy, bilateral: Secondary | ICD-10-CM

## 2013-11-23 DIAGNOSIS — H3531 Nonexudative age-related macular degeneration: Secondary | ICD-10-CM

## 2013-11-23 DIAGNOSIS — E11329 Type 2 diabetes mellitus with mild nonproliferative diabetic retinopathy without macular edema: Secondary | ICD-10-CM

## 2013-11-23 DIAGNOSIS — E11319 Type 2 diabetes mellitus with unspecified diabetic retinopathy without macular edema: Secondary | ICD-10-CM

## 2013-11-23 DIAGNOSIS — H43813 Vitreous degeneration, bilateral: Secondary | ICD-10-CM

## 2013-11-23 DIAGNOSIS — I10 Essential (primary) hypertension: Secondary | ICD-10-CM

## 2014-01-25 ENCOUNTER — Other Ambulatory Visit (HOSPITAL_COMMUNITY): Payer: Self-pay | Admitting: Internal Medicine

## 2014-01-25 DIAGNOSIS — Z1231 Encounter for screening mammogram for malignant neoplasm of breast: Secondary | ICD-10-CM

## 2014-02-15 ENCOUNTER — Ambulatory Visit (HOSPITAL_COMMUNITY): Payer: Medicare Other

## 2014-02-22 ENCOUNTER — Ambulatory Visit (HOSPITAL_COMMUNITY)
Admission: RE | Admit: 2014-02-22 | Discharge: 2014-02-22 | Disposition: A | Discharge status: Home / Self Care | Payer: PPO | Source: Ambulatory Visit | Attending: Internal Medicine | Admitting: Internal Medicine

## 2014-02-22 DIAGNOSIS — R928 Other abnormal and inconclusive findings on diagnostic imaging of breast: Secondary | ICD-10-CM | POA: Insufficient documentation

## 2014-02-22 DIAGNOSIS — Z1231 Encounter for screening mammogram for malignant neoplasm of breast: Secondary | ICD-10-CM | POA: Insufficient documentation

## 2014-02-23 ENCOUNTER — Other Ambulatory Visit: Payer: Self-pay | Admitting: Internal Medicine

## 2014-02-23 DIAGNOSIS — R928 Other abnormal and inconclusive findings on diagnostic imaging of breast: Secondary | ICD-10-CM

## 2014-03-02 ENCOUNTER — Ambulatory Visit (HOSPITAL_COMMUNITY)
Admission: RE | Admit: 2014-03-02 | Discharge: 2014-03-02 | Disposition: A | Discharge status: Home / Self Care | Payer: PPO | Source: Ambulatory Visit | Attending: Internal Medicine | Admitting: Internal Medicine

## 2014-03-02 DIAGNOSIS — R928 Other abnormal and inconclusive findings on diagnostic imaging of breast: Secondary | ICD-10-CM

## 2014-03-02 DIAGNOSIS — N63 Unspecified lump in breast: Secondary | ICD-10-CM | POA: Insufficient documentation

## 2014-03-22 ENCOUNTER — Encounter (INDEPENDENT_AMBULATORY_CARE_PROVIDER_SITE_OTHER): Payer: PPO | Admitting: Ophthalmology

## 2014-03-22 DIAGNOSIS — H3531 Nonexudative age-related macular degeneration: Secondary | ICD-10-CM

## 2014-03-22 DIAGNOSIS — E11329 Type 2 diabetes mellitus with mild nonproliferative diabetic retinopathy without macular edema: Secondary | ICD-10-CM

## 2014-03-22 DIAGNOSIS — H43813 Vitreous degeneration, bilateral: Secondary | ICD-10-CM

## 2014-03-22 DIAGNOSIS — H35033 Hypertensive retinopathy, bilateral: Secondary | ICD-10-CM

## 2014-03-22 DIAGNOSIS — I10 Essential (primary) hypertension: Secondary | ICD-10-CM

## 2014-03-22 DIAGNOSIS — E11319 Type 2 diabetes mellitus with unspecified diabetic retinopathy without macular edema: Secondary | ICD-10-CM

## 2014-06-15 DIAGNOSIS — D1720 Benign lipomatous neoplasm of skin and subcutaneous tissue of unspecified limb: Secondary | ICD-10-CM | POA: Insufficient documentation

## 2014-07-19 ENCOUNTER — Encounter (INDEPENDENT_AMBULATORY_CARE_PROVIDER_SITE_OTHER): Payer: PPO | Admitting: Ophthalmology

## 2014-07-19 DIAGNOSIS — H3531 Nonexudative age-related macular degeneration: Secondary | ICD-10-CM | POA: Diagnosis not present

## 2014-07-19 DIAGNOSIS — E11329 Type 2 diabetes mellitus with mild nonproliferative diabetic retinopathy without macular edema: Secondary | ICD-10-CM

## 2014-07-19 DIAGNOSIS — H43813 Vitreous degeneration, bilateral: Secondary | ICD-10-CM

## 2014-07-19 DIAGNOSIS — E11319 Type 2 diabetes mellitus with unspecified diabetic retinopathy without macular edema: Secondary | ICD-10-CM

## 2014-07-19 DIAGNOSIS — H35033 Hypertensive retinopathy, bilateral: Secondary | ICD-10-CM

## 2014-07-19 DIAGNOSIS — I10 Essential (primary) hypertension: Secondary | ICD-10-CM

## 2014-12-20 ENCOUNTER — Encounter (INDEPENDENT_AMBULATORY_CARE_PROVIDER_SITE_OTHER): Payer: PPO | Admitting: Ophthalmology

## 2014-12-20 DIAGNOSIS — I10 Essential (primary) hypertension: Secondary | ICD-10-CM | POA: Diagnosis not present

## 2014-12-20 DIAGNOSIS — E11311 Type 2 diabetes mellitus with unspecified diabetic retinopathy with macular edema: Secondary | ICD-10-CM | POA: Diagnosis not present

## 2014-12-20 DIAGNOSIS — E113292 Type 2 diabetes mellitus with mild nonproliferative diabetic retinopathy without macular edema, left eye: Secondary | ICD-10-CM | POA: Diagnosis not present

## 2014-12-20 DIAGNOSIS — H353211 Exudative age-related macular degeneration, right eye, with active choroidal neovascularization: Secondary | ICD-10-CM | POA: Diagnosis not present

## 2014-12-20 DIAGNOSIS — H35033 Hypertensive retinopathy, bilateral: Secondary | ICD-10-CM | POA: Diagnosis not present

## 2014-12-20 DIAGNOSIS — E113211 Type 2 diabetes mellitus with mild nonproliferative diabetic retinopathy with macular edema, right eye: Secondary | ICD-10-CM | POA: Diagnosis not present

## 2014-12-20 DIAGNOSIS — H43813 Vitreous degeneration, bilateral: Secondary | ICD-10-CM | POA: Diagnosis not present

## 2014-12-20 DIAGNOSIS — H353124 Nonexudative age-related macular degeneration, left eye, advanced atrophic with subfoveal involvement: Secondary | ICD-10-CM

## 2015-02-23 ENCOUNTER — Other Ambulatory Visit (HOSPITAL_COMMUNITY): Payer: Self-pay | Admitting: Internal Medicine

## 2015-02-23 DIAGNOSIS — Z1231 Encounter for screening mammogram for malignant neoplasm of breast: Secondary | ICD-10-CM

## 2015-03-15 DIAGNOSIS — L03031 Cellulitis of right toe: Secondary | ICD-10-CM | POA: Diagnosis not present

## 2015-03-22 DIAGNOSIS — L820 Inflamed seborrheic keratosis: Secondary | ICD-10-CM | POA: Diagnosis not present

## 2015-03-22 DIAGNOSIS — L281 Prurigo nodularis: Secondary | ICD-10-CM | POA: Diagnosis not present

## 2015-03-22 DIAGNOSIS — D239 Other benign neoplasm of skin, unspecified: Secondary | ICD-10-CM | POA: Diagnosis not present

## 2015-03-22 DIAGNOSIS — D485 Neoplasm of uncertain behavior of skin: Secondary | ICD-10-CM | POA: Diagnosis not present

## 2015-03-23 ENCOUNTER — Ambulatory Visit (HOSPITAL_COMMUNITY)
Admission: RE | Admit: 2015-03-23 | Discharge: 2015-03-23 | Disposition: A | Discharge status: Home / Self Care | Payer: PPO | Source: Ambulatory Visit | Attending: Internal Medicine | Admitting: Internal Medicine

## 2015-03-23 DIAGNOSIS — Z1231 Encounter for screening mammogram for malignant neoplasm of breast: Secondary | ICD-10-CM | POA: Insufficient documentation

## 2015-03-29 DIAGNOSIS — L03031 Cellulitis of right toe: Secondary | ICD-10-CM | POA: Diagnosis not present

## 2015-04-12 DIAGNOSIS — M2042 Other hammer toe(s) (acquired), left foot: Secondary | ICD-10-CM | POA: Diagnosis not present

## 2015-04-12 DIAGNOSIS — L03031 Cellulitis of right toe: Secondary | ICD-10-CM | POA: Diagnosis not present

## 2015-04-23 DIAGNOSIS — J00 Acute nasopharyngitis [common cold]: Secondary | ICD-10-CM | POA: Diagnosis not present

## 2015-04-23 DIAGNOSIS — R05 Cough: Secondary | ICD-10-CM | POA: Diagnosis not present

## 2015-05-04 DIAGNOSIS — J00 Acute nasopharyngitis [common cold]: Secondary | ICD-10-CM | POA: Diagnosis not present

## 2015-05-04 DIAGNOSIS — R05 Cough: Secondary | ICD-10-CM | POA: Diagnosis not present

## 2015-06-22 ENCOUNTER — Ambulatory Visit (INDEPENDENT_AMBULATORY_CARE_PROVIDER_SITE_OTHER): Payer: PPO | Admitting: Ophthalmology

## 2015-06-22 DIAGNOSIS — E113213 Type 2 diabetes mellitus with mild nonproliferative diabetic retinopathy with macular edema, bilateral: Secondary | ICD-10-CM

## 2015-06-22 DIAGNOSIS — E11311 Type 2 diabetes mellitus with unspecified diabetic retinopathy with macular edema: Secondary | ICD-10-CM | POA: Diagnosis not present

## 2015-06-22 DIAGNOSIS — H35033 Hypertensive retinopathy, bilateral: Secondary | ICD-10-CM | POA: Diagnosis not present

## 2015-06-22 DIAGNOSIS — I10 Essential (primary) hypertension: Secondary | ICD-10-CM

## 2015-06-22 DIAGNOSIS — H353231 Exudative age-related macular degeneration, bilateral, with active choroidal neovascularization: Secondary | ICD-10-CM | POA: Diagnosis not present

## 2015-06-22 DIAGNOSIS — H43813 Vitreous degeneration, bilateral: Secondary | ICD-10-CM

## 2015-07-20 DIAGNOSIS — E782 Mixed hyperlipidemia: Secondary | ICD-10-CM | POA: Diagnosis not present

## 2015-07-20 DIAGNOSIS — E119 Type 2 diabetes mellitus without complications: Secondary | ICD-10-CM | POA: Diagnosis not present

## 2015-07-20 DIAGNOSIS — I10 Essential (primary) hypertension: Secondary | ICD-10-CM | POA: Diagnosis not present

## 2015-07-22 DIAGNOSIS — D509 Iron deficiency anemia, unspecified: Secondary | ICD-10-CM | POA: Diagnosis not present

## 2015-07-22 DIAGNOSIS — I10 Essential (primary) hypertension: Secondary | ICD-10-CM | POA: Diagnosis not present

## 2015-07-22 DIAGNOSIS — E119 Type 2 diabetes mellitus without complications: Secondary | ICD-10-CM | POA: Diagnosis not present

## 2015-07-22 DIAGNOSIS — E782 Mixed hyperlipidemia: Secondary | ICD-10-CM | POA: Diagnosis not present

## 2015-12-01 DIAGNOSIS — E119 Type 2 diabetes mellitus without complications: Secondary | ICD-10-CM | POA: Diagnosis not present

## 2015-12-01 DIAGNOSIS — I10 Essential (primary) hypertension: Secondary | ICD-10-CM | POA: Diagnosis not present

## 2015-12-01 DIAGNOSIS — D509 Iron deficiency anemia, unspecified: Secondary | ICD-10-CM | POA: Diagnosis not present

## 2015-12-12 DIAGNOSIS — H3530 Unspecified macular degeneration: Secondary | ICD-10-CM | POA: Diagnosis not present

## 2015-12-12 DIAGNOSIS — Z6841 Body Mass Index (BMI) 40.0 and over, adult: Secondary | ICD-10-CM | POA: Diagnosis not present

## 2015-12-12 DIAGNOSIS — I10 Essential (primary) hypertension: Secondary | ICD-10-CM | POA: Diagnosis not present

## 2015-12-12 DIAGNOSIS — D509 Iron deficiency anemia, unspecified: Secondary | ICD-10-CM | POA: Diagnosis not present

## 2015-12-12 DIAGNOSIS — E119 Type 2 diabetes mellitus without complications: Secondary | ICD-10-CM | POA: Diagnosis not present

## 2015-12-12 DIAGNOSIS — E782 Mixed hyperlipidemia: Secondary | ICD-10-CM | POA: Diagnosis not present

## 2015-12-12 DIAGNOSIS — Z23 Encounter for immunization: Secondary | ICD-10-CM | POA: Diagnosis not present

## 2015-12-28 ENCOUNTER — Ambulatory Visit (INDEPENDENT_AMBULATORY_CARE_PROVIDER_SITE_OTHER): Payer: PPO | Admitting: Ophthalmology

## 2015-12-28 DIAGNOSIS — I10 Essential (primary) hypertension: Secondary | ICD-10-CM

## 2015-12-28 DIAGNOSIS — H43813 Vitreous degeneration, bilateral: Secondary | ICD-10-CM

## 2015-12-28 DIAGNOSIS — H35033 Hypertensive retinopathy, bilateral: Secondary | ICD-10-CM

## 2015-12-28 DIAGNOSIS — H353231 Exudative age-related macular degeneration, bilateral, with active choroidal neovascularization: Secondary | ICD-10-CM | POA: Diagnosis not present

## 2016-01-26 DIAGNOSIS — J019 Acute sinusitis, unspecified: Secondary | ICD-10-CM | POA: Diagnosis not present

## 2016-01-26 DIAGNOSIS — J45909 Unspecified asthma, uncomplicated: Secondary | ICD-10-CM | POA: Diagnosis not present

## 2016-03-16 ENCOUNTER — Other Ambulatory Visit (HOSPITAL_COMMUNITY): Payer: Self-pay | Admitting: Internal Medicine

## 2016-03-16 DIAGNOSIS — Z1231 Encounter for screening mammogram for malignant neoplasm of breast: Secondary | ICD-10-CM

## 2016-03-28 ENCOUNTER — Ambulatory Visit (HOSPITAL_COMMUNITY)
Admission: RE | Admit: 2016-03-28 | Discharge: 2016-03-28 | Disposition: A | Discharge status: Home / Self Care | Payer: PPO | Source: Ambulatory Visit | Attending: Internal Medicine | Admitting: Internal Medicine

## 2016-03-28 DIAGNOSIS — Z1231 Encounter for screening mammogram for malignant neoplasm of breast: Secondary | ICD-10-CM | POA: Diagnosis not present

## 2016-03-28 DIAGNOSIS — R928 Other abnormal and inconclusive findings on diagnostic imaging of breast: Secondary | ICD-10-CM | POA: Diagnosis not present

## 2016-04-02 ENCOUNTER — Other Ambulatory Visit (HOSPITAL_COMMUNITY): Payer: Self-pay | Admitting: Internal Medicine

## 2016-04-02 ENCOUNTER — Ambulatory Visit (HOSPITAL_COMMUNITY): Payer: PPO

## 2016-04-02 DIAGNOSIS — R928 Other abnormal and inconclusive findings on diagnostic imaging of breast: Secondary | ICD-10-CM

## 2016-04-17 ENCOUNTER — Ambulatory Visit (HOSPITAL_COMMUNITY)
Admission: RE | Admit: 2016-04-17 | Discharge: 2016-04-17 | Disposition: A | Discharge status: Home / Self Care | Payer: PPO | Source: Ambulatory Visit | Attending: Internal Medicine | Admitting: Internal Medicine

## 2016-04-17 ENCOUNTER — Encounter (HOSPITAL_COMMUNITY): Payer: Self-pay

## 2016-04-17 ENCOUNTER — Encounter (HOSPITAL_COMMUNITY): Payer: PPO

## 2016-04-17 DIAGNOSIS — R928 Other abnormal and inconclusive findings on diagnostic imaging of breast: Secondary | ICD-10-CM

## 2016-04-25 DIAGNOSIS — M25539 Pain in unspecified wrist: Secondary | ICD-10-CM | POA: Diagnosis not present

## 2016-05-03 DIAGNOSIS — D509 Iron deficiency anemia, unspecified: Secondary | ICD-10-CM | POA: Diagnosis not present

## 2016-05-03 DIAGNOSIS — E119 Type 2 diabetes mellitus without complications: Secondary | ICD-10-CM | POA: Diagnosis not present

## 2016-05-03 DIAGNOSIS — E559 Vitamin D deficiency, unspecified: Secondary | ICD-10-CM | POA: Diagnosis not present

## 2016-05-03 DIAGNOSIS — E782 Mixed hyperlipidemia: Secondary | ICD-10-CM | POA: Diagnosis not present

## 2016-05-07 DIAGNOSIS — M25532 Pain in left wrist: Secondary | ICD-10-CM | POA: Diagnosis not present

## 2016-05-07 DIAGNOSIS — E782 Mixed hyperlipidemia: Secondary | ICD-10-CM | POA: Diagnosis not present

## 2016-05-07 DIAGNOSIS — E119 Type 2 diabetes mellitus without complications: Secondary | ICD-10-CM | POA: Diagnosis not present

## 2016-05-07 DIAGNOSIS — D509 Iron deficiency anemia, unspecified: Secondary | ICD-10-CM | POA: Diagnosis not present

## 2016-05-07 DIAGNOSIS — E559 Vitamin D deficiency, unspecified: Secondary | ICD-10-CM | POA: Diagnosis not present

## 2016-05-07 DIAGNOSIS — I10 Essential (primary) hypertension: Secondary | ICD-10-CM | POA: Diagnosis not present

## 2016-05-07 DIAGNOSIS — H3530 Unspecified macular degeneration: Secondary | ICD-10-CM | POA: Diagnosis not present

## 2016-05-07 DIAGNOSIS — Z6841 Body Mass Index (BMI) 40.0 and over, adult: Secondary | ICD-10-CM | POA: Diagnosis not present

## 2016-06-28 DIAGNOSIS — H353233 Exudative age-related macular degeneration, bilateral, with inactive scar: Secondary | ICD-10-CM | POA: Diagnosis not present

## 2016-09-05 DIAGNOSIS — I10 Essential (primary) hypertension: Secondary | ICD-10-CM | POA: Diagnosis not present

## 2016-09-05 DIAGNOSIS — E559 Vitamin D deficiency, unspecified: Secondary | ICD-10-CM | POA: Diagnosis not present

## 2016-09-05 DIAGNOSIS — E119 Type 2 diabetes mellitus without complications: Secondary | ICD-10-CM | POA: Diagnosis not present

## 2016-09-05 DIAGNOSIS — E782 Mixed hyperlipidemia: Secondary | ICD-10-CM | POA: Diagnosis not present

## 2016-09-05 DIAGNOSIS — M159 Polyosteoarthritis, unspecified: Secondary | ICD-10-CM | POA: Diagnosis not present

## 2016-09-05 DIAGNOSIS — H3530 Unspecified macular degeneration: Secondary | ICD-10-CM | POA: Diagnosis not present

## 2016-09-05 DIAGNOSIS — Z6841 Body Mass Index (BMI) 40.0 and over, adult: Secondary | ICD-10-CM | POA: Diagnosis not present

## 2016-09-05 DIAGNOSIS — D509 Iron deficiency anemia, unspecified: Secondary | ICD-10-CM | POA: Diagnosis not present

## 2016-09-26 ENCOUNTER — Ambulatory Visit (INDEPENDENT_AMBULATORY_CARE_PROVIDER_SITE_OTHER): Payer: PPO | Admitting: Ophthalmology

## 2016-09-26 DIAGNOSIS — I10 Essential (primary) hypertension: Secondary | ICD-10-CM | POA: Diagnosis not present

## 2016-09-26 DIAGNOSIS — E113293 Type 2 diabetes mellitus with mild nonproliferative diabetic retinopathy without macular edema, bilateral: Secondary | ICD-10-CM | POA: Diagnosis not present

## 2016-09-26 DIAGNOSIS — H43813 Vitreous degeneration, bilateral: Secondary | ICD-10-CM

## 2016-09-26 DIAGNOSIS — E11319 Type 2 diabetes mellitus with unspecified diabetic retinopathy without macular edema: Secondary | ICD-10-CM

## 2016-09-26 DIAGNOSIS — H35033 Hypertensive retinopathy, bilateral: Secondary | ICD-10-CM | POA: Diagnosis not present

## 2016-09-26 DIAGNOSIS — H353231 Exudative age-related macular degeneration, bilateral, with active choroidal neovascularization: Secondary | ICD-10-CM

## 2017-01-01 DIAGNOSIS — M545 Low back pain: Secondary | ICD-10-CM | POA: Diagnosis not present

## 2017-01-01 DIAGNOSIS — M9903 Segmental and somatic dysfunction of lumbar region: Secondary | ICD-10-CM | POA: Diagnosis not present

## 2017-01-01 DIAGNOSIS — M9902 Segmental and somatic dysfunction of thoracic region: Secondary | ICD-10-CM | POA: Diagnosis not present

## 2017-01-01 DIAGNOSIS — M9905 Segmental and somatic dysfunction of pelvic region: Secondary | ICD-10-CM | POA: Diagnosis not present

## 2017-01-07 DIAGNOSIS — E559 Vitamin D deficiency, unspecified: Secondary | ICD-10-CM | POA: Diagnosis not present

## 2017-01-07 DIAGNOSIS — M9902 Segmental and somatic dysfunction of thoracic region: Secondary | ICD-10-CM | POA: Diagnosis not present

## 2017-01-07 DIAGNOSIS — D509 Iron deficiency anemia, unspecified: Secondary | ICD-10-CM | POA: Diagnosis not present

## 2017-01-07 DIAGNOSIS — E782 Mixed hyperlipidemia: Secondary | ICD-10-CM | POA: Diagnosis not present

## 2017-01-07 DIAGNOSIS — M9903 Segmental and somatic dysfunction of lumbar region: Secondary | ICD-10-CM | POA: Diagnosis not present

## 2017-01-07 DIAGNOSIS — M9905 Segmental and somatic dysfunction of pelvic region: Secondary | ICD-10-CM | POA: Diagnosis not present

## 2017-01-07 DIAGNOSIS — E119 Type 2 diabetes mellitus without complications: Secondary | ICD-10-CM | POA: Diagnosis not present

## 2017-01-07 DIAGNOSIS — M545 Low back pain: Secondary | ICD-10-CM | POA: Diagnosis not present

## 2017-01-09 DIAGNOSIS — R739 Hyperglycemia, unspecified: Secondary | ICD-10-CM | POA: Diagnosis not present

## 2017-01-09 DIAGNOSIS — H9319 Tinnitus, unspecified ear: Secondary | ICD-10-CM | POA: Diagnosis not present

## 2017-01-09 DIAGNOSIS — E559 Vitamin D deficiency, unspecified: Secondary | ICD-10-CM | POA: Diagnosis not present

## 2017-01-09 DIAGNOSIS — I10 Essential (primary) hypertension: Secondary | ICD-10-CM | POA: Diagnosis not present

## 2017-01-09 DIAGNOSIS — H3530 Unspecified macular degeneration: Secondary | ICD-10-CM | POA: Diagnosis not present

## 2017-01-09 DIAGNOSIS — E782 Mixed hyperlipidemia: Secondary | ICD-10-CM | POA: Diagnosis not present

## 2017-01-09 DIAGNOSIS — M8940 Other hypertrophic osteoarthropathy, unspecified site: Secondary | ICD-10-CM | POA: Diagnosis not present

## 2017-01-09 DIAGNOSIS — R5381 Other malaise: Secondary | ICD-10-CM | POA: Diagnosis not present

## 2017-01-09 DIAGNOSIS — Z23 Encounter for immunization: Secondary | ICD-10-CM | POA: Diagnosis not present

## 2017-01-09 DIAGNOSIS — D509 Iron deficiency anemia, unspecified: Secondary | ICD-10-CM | POA: Diagnosis not present

## 2017-01-09 DIAGNOSIS — E119 Type 2 diabetes mellitus without complications: Secondary | ICD-10-CM | POA: Diagnosis not present

## 2017-01-09 DIAGNOSIS — Z6841 Body Mass Index (BMI) 40.0 and over, adult: Secondary | ICD-10-CM | POA: Diagnosis not present

## 2017-01-11 DIAGNOSIS — M545 Low back pain: Secondary | ICD-10-CM | POA: Diagnosis not present

## 2017-01-11 DIAGNOSIS — M9905 Segmental and somatic dysfunction of pelvic region: Secondary | ICD-10-CM | POA: Diagnosis not present

## 2017-01-11 DIAGNOSIS — M9902 Segmental and somatic dysfunction of thoracic region: Secondary | ICD-10-CM | POA: Diagnosis not present

## 2017-01-11 DIAGNOSIS — M9903 Segmental and somatic dysfunction of lumbar region: Secondary | ICD-10-CM | POA: Diagnosis not present

## 2017-01-14 DIAGNOSIS — M9902 Segmental and somatic dysfunction of thoracic region: Secondary | ICD-10-CM | POA: Diagnosis not present

## 2017-01-14 DIAGNOSIS — M9903 Segmental and somatic dysfunction of lumbar region: Secondary | ICD-10-CM | POA: Diagnosis not present

## 2017-01-14 DIAGNOSIS — M9905 Segmental and somatic dysfunction of pelvic region: Secondary | ICD-10-CM | POA: Diagnosis not present

## 2017-01-14 DIAGNOSIS — M545 Low back pain: Secondary | ICD-10-CM | POA: Diagnosis not present

## 2017-01-18 DIAGNOSIS — M9905 Segmental and somatic dysfunction of pelvic region: Secondary | ICD-10-CM | POA: Diagnosis not present

## 2017-01-18 DIAGNOSIS — M545 Low back pain: Secondary | ICD-10-CM | POA: Diagnosis not present

## 2017-01-18 DIAGNOSIS — M9903 Segmental and somatic dysfunction of lumbar region: Secondary | ICD-10-CM | POA: Diagnosis not present

## 2017-01-18 DIAGNOSIS — M9902 Segmental and somatic dysfunction of thoracic region: Secondary | ICD-10-CM | POA: Diagnosis not present

## 2017-01-25 DIAGNOSIS — M9902 Segmental and somatic dysfunction of thoracic region: Secondary | ICD-10-CM | POA: Diagnosis not present

## 2017-01-25 DIAGNOSIS — M9903 Segmental and somatic dysfunction of lumbar region: Secondary | ICD-10-CM | POA: Diagnosis not present

## 2017-01-25 DIAGNOSIS — M545 Low back pain: Secondary | ICD-10-CM | POA: Diagnosis not present

## 2017-01-25 DIAGNOSIS — M9905 Segmental and somatic dysfunction of pelvic region: Secondary | ICD-10-CM | POA: Diagnosis not present

## 2017-02-01 DIAGNOSIS — M9905 Segmental and somatic dysfunction of pelvic region: Secondary | ICD-10-CM | POA: Diagnosis not present

## 2017-02-01 DIAGNOSIS — M9902 Segmental and somatic dysfunction of thoracic region: Secondary | ICD-10-CM | POA: Diagnosis not present

## 2017-02-01 DIAGNOSIS — M9903 Segmental and somatic dysfunction of lumbar region: Secondary | ICD-10-CM | POA: Diagnosis not present

## 2017-02-01 DIAGNOSIS — M545 Low back pain: Secondary | ICD-10-CM | POA: Diagnosis not present

## 2017-02-15 DIAGNOSIS — M545 Low back pain: Secondary | ICD-10-CM | POA: Diagnosis not present

## 2017-02-15 DIAGNOSIS — M9902 Segmental and somatic dysfunction of thoracic region: Secondary | ICD-10-CM | POA: Diagnosis not present

## 2017-02-15 DIAGNOSIS — M9905 Segmental and somatic dysfunction of pelvic region: Secondary | ICD-10-CM | POA: Diagnosis not present

## 2017-02-15 DIAGNOSIS — M9903 Segmental and somatic dysfunction of lumbar region: Secondary | ICD-10-CM | POA: Diagnosis not present

## 2017-02-22 DIAGNOSIS — M9903 Segmental and somatic dysfunction of lumbar region: Secondary | ICD-10-CM | POA: Diagnosis not present

## 2017-02-22 DIAGNOSIS — M9902 Segmental and somatic dysfunction of thoracic region: Secondary | ICD-10-CM | POA: Diagnosis not present

## 2017-02-22 DIAGNOSIS — M545 Low back pain: Secondary | ICD-10-CM | POA: Diagnosis not present

## 2017-02-22 DIAGNOSIS — M9905 Segmental and somatic dysfunction of pelvic region: Secondary | ICD-10-CM | POA: Diagnosis not present

## 2017-03-01 DIAGNOSIS — M9905 Segmental and somatic dysfunction of pelvic region: Secondary | ICD-10-CM | POA: Diagnosis not present

## 2017-03-01 DIAGNOSIS — M9903 Segmental and somatic dysfunction of lumbar region: Secondary | ICD-10-CM | POA: Diagnosis not present

## 2017-03-01 DIAGNOSIS — M545 Low back pain: Secondary | ICD-10-CM | POA: Diagnosis not present

## 2017-03-01 DIAGNOSIS — M9902 Segmental and somatic dysfunction of thoracic region: Secondary | ICD-10-CM | POA: Diagnosis not present

## 2017-03-08 DIAGNOSIS — M9903 Segmental and somatic dysfunction of lumbar region: Secondary | ICD-10-CM | POA: Diagnosis not present

## 2017-03-08 DIAGNOSIS — M9905 Segmental and somatic dysfunction of pelvic region: Secondary | ICD-10-CM | POA: Diagnosis not present

## 2017-03-08 DIAGNOSIS — M545 Low back pain: Secondary | ICD-10-CM | POA: Diagnosis not present

## 2017-03-08 DIAGNOSIS — M9902 Segmental and somatic dysfunction of thoracic region: Secondary | ICD-10-CM | POA: Diagnosis not present

## 2017-03-15 ENCOUNTER — Other Ambulatory Visit (HOSPITAL_COMMUNITY): Payer: Self-pay | Admitting: Internal Medicine

## 2017-03-15 DIAGNOSIS — Z1231 Encounter for screening mammogram for malignant neoplasm of breast: Secondary | ICD-10-CM

## 2017-04-02 DIAGNOSIS — H9319 Tinnitus, unspecified ear: Secondary | ICD-10-CM | POA: Diagnosis not present

## 2017-04-02 DIAGNOSIS — Z6841 Body Mass Index (BMI) 40.0 and over, adult: Secondary | ICD-10-CM | POA: Diagnosis not present

## 2017-04-02 DIAGNOSIS — D509 Iron deficiency anemia, unspecified: Secondary | ICD-10-CM | POA: Diagnosis not present

## 2017-04-02 DIAGNOSIS — E559 Vitamin D deficiency, unspecified: Secondary | ICD-10-CM | POA: Diagnosis not present

## 2017-04-02 DIAGNOSIS — R5381 Other malaise: Secondary | ICD-10-CM | POA: Diagnosis not present

## 2017-04-02 DIAGNOSIS — R739 Hyperglycemia, unspecified: Secondary | ICD-10-CM | POA: Diagnosis not present

## 2017-04-02 DIAGNOSIS — Z23 Encounter for immunization: Secondary | ICD-10-CM | POA: Diagnosis not present

## 2017-04-02 DIAGNOSIS — M159 Polyosteoarthritis, unspecified: Secondary | ICD-10-CM | POA: Diagnosis not present

## 2017-04-02 DIAGNOSIS — H3530 Unspecified macular degeneration: Secondary | ICD-10-CM | POA: Diagnosis not present

## 2017-04-02 DIAGNOSIS — M8940 Other hypertrophic osteoarthropathy, unspecified site: Secondary | ICD-10-CM | POA: Diagnosis not present

## 2017-04-02 DIAGNOSIS — I10 Essential (primary) hypertension: Secondary | ICD-10-CM | POA: Diagnosis not present

## 2017-04-04 DIAGNOSIS — E559 Vitamin D deficiency, unspecified: Secondary | ICD-10-CM | POA: Diagnosis not present

## 2017-04-04 DIAGNOSIS — M159 Polyosteoarthritis, unspecified: Secondary | ICD-10-CM | POA: Diagnosis not present

## 2017-04-04 DIAGNOSIS — I10 Essential (primary) hypertension: Secondary | ICD-10-CM | POA: Diagnosis not present

## 2017-04-04 DIAGNOSIS — H3530 Unspecified macular degeneration: Secondary | ICD-10-CM | POA: Diagnosis not present

## 2017-04-04 DIAGNOSIS — E782 Mixed hyperlipidemia: Secondary | ICD-10-CM | POA: Diagnosis not present

## 2017-04-04 DIAGNOSIS — R5381 Other malaise: Secondary | ICD-10-CM | POA: Diagnosis not present

## 2017-04-04 DIAGNOSIS — E875 Hyperkalemia: Secondary | ICD-10-CM | POA: Diagnosis not present

## 2017-04-04 DIAGNOSIS — E119 Type 2 diabetes mellitus without complications: Secondary | ICD-10-CM | POA: Diagnosis not present

## 2017-04-04 DIAGNOSIS — Z6841 Body Mass Index (BMI) 40.0 and over, adult: Secondary | ICD-10-CM | POA: Diagnosis not present

## 2017-04-04 DIAGNOSIS — M25519 Pain in unspecified shoulder: Secondary | ICD-10-CM | POA: Diagnosis not present

## 2017-04-04 DIAGNOSIS — D509 Iron deficiency anemia, unspecified: Secondary | ICD-10-CM | POA: Diagnosis not present

## 2017-04-18 ENCOUNTER — Ambulatory Visit (HOSPITAL_COMMUNITY)
Admission: RE | Admit: 2017-04-18 | Discharge: 2017-04-18 | Disposition: A | Discharge status: Home / Self Care | Payer: PPO | Source: Ambulatory Visit | Attending: Internal Medicine | Admitting: Internal Medicine

## 2017-04-18 DIAGNOSIS — Z1231 Encounter for screening mammogram for malignant neoplasm of breast: Secondary | ICD-10-CM | POA: Diagnosis not present

## 2017-04-19 ENCOUNTER — Ambulatory Visit (INDEPENDENT_AMBULATORY_CARE_PROVIDER_SITE_OTHER): Payer: PPO | Admitting: Orthopaedic Surgery

## 2017-04-19 ENCOUNTER — Encounter (INDEPENDENT_AMBULATORY_CARE_PROVIDER_SITE_OTHER): Payer: Self-pay | Admitting: Orthopaedic Surgery

## 2017-04-19 ENCOUNTER — Ambulatory Visit (INDEPENDENT_AMBULATORY_CARE_PROVIDER_SITE_OTHER): Payer: PPO

## 2017-04-19 VITALS — BP 132/77 | HR 77 | Ht 65.0 in | Wt 234.0 lb

## 2017-04-19 DIAGNOSIS — M25512 Pain in left shoulder: Secondary | ICD-10-CM

## 2017-04-19 DIAGNOSIS — G8929 Other chronic pain: Secondary | ICD-10-CM

## 2017-04-19 MED ORDER — LIDOCAINE HCL 1 % IJ SOLN
2.0000 mL | INTRAMUSCULAR | Status: AC | PRN
  Administered 2017-04-19: 2 mL

## 2017-04-19 MED ORDER — BUPIVACAINE HCL 0.5 % IJ SOLN
2.0000 mL | INTRAMUSCULAR | Status: AC | PRN
  Administered 2017-04-19: 2 mL via INTRA_ARTICULAR

## 2017-04-19 MED ORDER — METHYLPREDNISOLONE ACETATE 40 MG/ML IJ SUSP
80.0000 mg | INTRAMUSCULAR | Status: AC | PRN
Start: 2017-04-19 — End: 2017-04-19
  Administered 2017-04-19: 80 mg

## 2017-04-19 NOTE — Progress Notes (Signed)
Office Visit Note   Patient: Shelly [name]           Date of Birth: 1940-11-02           MRN: [id number] Visit Date: 04/19/2017              Requested by: Celene Squibb, MD [place], [place] [address] PCP: Celene Squibb, MD   Assessment & Plan: Visit Diagnoses:  1. Chronic left shoulder pain     Plan: Advanced osteoarthritis left shoulder. Long discussion regarding treatment options. Shelly [name] is performing exercises. I will inject the glenohumeral joint with cortisone and monitor her response. Much better after the injection  Follow-Up Instructions: Return if symptoms worsen or fail to improve.   Orders:  Orders Placed This Encounter  Procedures  . XR Shoulder Left   No orders of the defined types were placed in this encounter.     Procedures: Large Joint Inj: L glenohumeral on 04/19/2017 12:12 PM Details: 25 G 1.5 in needle, posterior approach  Arthrogram: No  Medications: 2 mL lidocaine 1 %; 2 mL bupivacaine 0.5 %; 80 mg methylPREDNISolone acetate 40 MG/ML Procedure, treatment alternatives, risks and benefits explained, specific risks discussed. Consent was given by the patient. Immediately prior to procedure a time out was called to verify the correct patient, procedure, equipment, support staff and site/side marked as required. Patient was prepped and draped in the usual sterile fashion.       Clinical Data: No additional findings.   Subjective: Chief Complaint  Patient presents with  . Left Shoulder - Pain    Shelly [name] IS 76 YO F HERE FOR LEFT SHOULDER PAIN FOR ABOUT 1 YEAR GETTING WORSE, NO INJURY. SEEN CHIROPRATOR AND D EXERCISES BUT STILL NO BETTER. NO INJECTIONS NO XRAYS.  Progressive pain over about 1 year with left shoulder. No injury or trauma. Has seen a chiropractor who ordered some x-rays. These seem to be helping. Has history of macular degeneration and cannot take any NSAIDs. Takes 2 Tylenol and Benadryl to sleep. Has noted  some limited range of motion and pain with left shoulder. No pain on the right. No numbness or tingling or discomfort related to the cervical spine  HPI  Review of Systems  Constitutional: Negative for fatigue and fever.  HENT: Positive for ear pain.   Eyes: Negative for pain.  Respiratory: Positive for cough and shortness of breath.   Cardiovascular: Negative for palpitations and leg swelling.  Gastrointestinal: Negative for blood in stool, constipation and diarrhea.  Genitourinary: Negative for dysuria.  Musculoskeletal: Negative for back pain and neck pain.  Allergic/Immunologic: Negative for food allergies.  Neurological: Negative for dizziness, weakness, light-headedness, numbness and headaches.  Hematological: Bruises/bleeds easily.  Psychiatric/Behavioral: Negative for sleep disturbance.     Objective: Vital Signs: BP 132/77 (BP Location: Left Arm, Patient Position: Sitting, Cuff Size: Normal)   Pulse 77   Ht 5\' 5"  (1.651 m)   Wt 234 lb (106.1 kg)   BMI 38.94 kg/m   Physical Exam  Ortho Exam awake alert and oriented 3. We'll sitting. Can actively abduct about 70 with the left shoulder and flex about 120. Skin intact. Biceps intact. Good grip and release. No pain referable to the cervical spine. Slight limited motion but not significant. Minimal grating with range of motion. Actively I can abduct to 90 and flex about 130 external rotation was about 25-30. After injection abduction actively to 90 flexed about 140 with minimal  discomfort. External rotation probably 45-50  Specialty Comments:  No specialty comments available.  Imaging: Xr Shoulder Left  Result Date: 04/19/2017 Films of the left shoulder obtained in several projections. There is a normal space between the humeral head and the chromium. No ectopic calcification. There is some degenerative change at the acromioclavicular joint. There are inferiorly directed spurs the distal clavicle and acromion. A large  inferior humeral head spurs identify was some narrowing of the space between the humeral head and the glenoid. Humeral head is centered about the glenoid. Films consistent with advanced osteoarthritis left shoulder    PMFS History: Patient Active Problem List   Diagnosis Date Noted  . Osteoarthritis of left knee 07/03/2012  . Hx of diagnostic tests 05/16/2012  . Hyperlipidemia   . Asthma   . Diabetes mellitus ([place])   . PUD (peptic ulcer disease)   . Degenerative joint disease   . Cholelithiasis   . MACULAR DEGENERATION 12/07/2008  . HYPERTENSION 12/07/2008   Past Medical History:  Diagnosis Date  . Arthritis    "qwhere" (07/01/2012)  . Asthma    normal chest x-ray in 2010  . Basal cell carcinoma of face   . Cholelithiasis   . Chronic lower back pain   . Degenerative joint disease    Right TKA-2010; low back pain; hands and hips also affected  . Exertional shortness of breath    "mostly from being too fat" (07/01/2012)  . GERD (gastroesophageal reflux disease)    history  . H/O hiatal hernia   . Heart murmur    small  . Hemorrhoid   . HTN (hypertension)   . Hyperlipidemia   . Macular degeneration    "both eyes" (07/01/2012)  . PUD (peptic ulcer disease) 2003   Upper GI bleed-gastric ulcer; gastroesophageal reflux disease  . Seasonal allergies   . Type II diabetes mellitus ([facility])     Family History  Problem Relation Age of Onset  . Hodgkin's lymphoma Mother     Past Surgical History:  Procedure Laterality Date  . APPENDECTOMY    . CATARACT EXTRACTION BILATERAL W/ ANTERIOR VITRECTOMY Bilateral   . EYE SURGERY Bilateral    "laser in eyes to stop bleeding and injections for macular degeneration" (07/01/2012)  . FOOT SURGERY Bilateral    straighten 1st toe with a wedge.  Marland Kitchen KNEE ARTHROSCOPY Bilateral   . SKIN CANCER EXCISION  2013   "face" (07/01/2012)  . TOTAL KNEE ARTHROPLASTY Right 2011  . TOTAL KNEE ARTHROPLASTY Left 07/01/2012  . TOTAL KNEE ARTHROPLASTY Left  07/01/2012   Procedure: LEFT TOTAL KNEE ARTHROPLASTY;  Surgeon: Garald Balding, MD;  Location: [facility];  Service: Orthopedics;  Laterality: Left;  Left Total Knee Arthroplasty   Social History   Occupational History  . Not on file  Tobacco Use  . Smoking status: Former Smoker    Packs/day: 2.50    Years: 27.00    Pack years: 67.50    Types: Cigarettes    Last attempt to quit: 02/12/1981    Years since quitting: 36.2  . Smokeless tobacco: Never Used  Substance and Sexual Activity  . Alcohol use: No  . Drug use: No  . Sexual activity: Not Currently

## 2017-05-20 DIAGNOSIS — Z6841 Body Mass Index (BMI) 40.0 and over, adult: Secondary | ICD-10-CM | POA: Diagnosis not present

## 2017-05-20 DIAGNOSIS — J019 Acute sinusitis, unspecified: Secondary | ICD-10-CM | POA: Diagnosis not present

## 2017-05-24 DIAGNOSIS — J019 Acute sinusitis, unspecified: Secondary | ICD-10-CM | POA: Diagnosis not present

## 2017-05-24 DIAGNOSIS — Z6841 Body Mass Index (BMI) 40.0 and over, adult: Secondary | ICD-10-CM | POA: Diagnosis not present

## 2017-05-27 ENCOUNTER — Other Ambulatory Visit (HOSPITAL_COMMUNITY): Payer: Self-pay | Admitting: Internal Medicine

## 2017-05-27 DIAGNOSIS — R928 Other abnormal and inconclusive findings on diagnostic imaging of breast: Secondary | ICD-10-CM

## 2017-05-28 ENCOUNTER — Ambulatory Visit (HOSPITAL_COMMUNITY)
Admission: RE | Admit: 2017-05-28 | Discharge: 2017-05-28 | Disposition: A | Discharge status: Home / Self Care | Payer: PPO | Source: Ambulatory Visit | Attending: Internal Medicine | Admitting: Internal Medicine

## 2017-05-28 DIAGNOSIS — N6321 Unspecified lump in the left breast, upper outer quadrant: Secondary | ICD-10-CM | POA: Diagnosis not present

## 2017-05-28 DIAGNOSIS — R928 Other abnormal and inconclusive findings on diagnostic imaging of breast: Secondary | ICD-10-CM

## 2017-06-04 DIAGNOSIS — R590 Localized enlarged lymph nodes: Secondary | ICD-10-CM | POA: Diagnosis not present

## 2017-07-01 ENCOUNTER — Encounter (INDEPENDENT_AMBULATORY_CARE_PROVIDER_SITE_OTHER): Payer: PPO | Admitting: Ophthalmology

## 2017-07-01 DIAGNOSIS — E11319 Type 2 diabetes mellitus with unspecified diabetic retinopathy without macular edema: Secondary | ICD-10-CM

## 2017-07-01 DIAGNOSIS — E113293 Type 2 diabetes mellitus with mild nonproliferative diabetic retinopathy without macular edema, bilateral: Secondary | ICD-10-CM

## 2017-07-01 DIAGNOSIS — I10 Essential (primary) hypertension: Secondary | ICD-10-CM | POA: Diagnosis not present

## 2017-07-01 DIAGNOSIS — H353231 Exudative age-related macular degeneration, bilateral, with active choroidal neovascularization: Secondary | ICD-10-CM | POA: Diagnosis not present

## 2017-07-01 DIAGNOSIS — H35033 Hypertensive retinopathy, bilateral: Secondary | ICD-10-CM

## 2017-07-01 DIAGNOSIS — H43813 Vitreous degeneration, bilateral: Secondary | ICD-10-CM

## 2017-07-16 DIAGNOSIS — E559 Vitamin D deficiency, unspecified: Secondary | ICD-10-CM | POA: Diagnosis not present

## 2017-07-16 DIAGNOSIS — E119 Type 2 diabetes mellitus without complications: Secondary | ICD-10-CM | POA: Diagnosis not present

## 2017-07-23 DIAGNOSIS — E875 Hyperkalemia: Secondary | ICD-10-CM | POA: Diagnosis not present

## 2017-07-23 DIAGNOSIS — H3530 Unspecified macular degeneration: Secondary | ICD-10-CM | POA: Diagnosis not present

## 2017-07-23 DIAGNOSIS — E1169 Type 2 diabetes mellitus with other specified complication: Secondary | ICD-10-CM | POA: Diagnosis not present

## 2017-07-23 DIAGNOSIS — M25559 Pain in unspecified hip: Secondary | ICD-10-CM | POA: Diagnosis not present

## 2017-07-23 DIAGNOSIS — E782 Mixed hyperlipidemia: Secondary | ICD-10-CM | POA: Diagnosis not present

## 2017-07-23 DIAGNOSIS — M1990 Unspecified osteoarthritis, unspecified site: Secondary | ICD-10-CM | POA: Diagnosis not present

## 2017-07-23 DIAGNOSIS — M545 Low back pain: Secondary | ICD-10-CM | POA: Diagnosis not present

## 2017-07-23 DIAGNOSIS — I10 Essential (primary) hypertension: Secondary | ICD-10-CM | POA: Diagnosis not present

## 2017-07-23 DIAGNOSIS — D509 Iron deficiency anemia, unspecified: Secondary | ICD-10-CM | POA: Diagnosis not present

## 2017-07-23 DIAGNOSIS — E559 Vitamin D deficiency, unspecified: Secondary | ICD-10-CM | POA: Diagnosis not present

## 2017-07-23 DIAGNOSIS — M25519 Pain in unspecified shoulder: Secondary | ICD-10-CM | POA: Diagnosis not present

## 2017-07-24 ENCOUNTER — Other Ambulatory Visit (HOSPITAL_COMMUNITY): Payer: Self-pay | Admitting: Internal Medicine

## 2017-07-24 DIAGNOSIS — M25519 Pain in unspecified shoulder: Secondary | ICD-10-CM | POA: Diagnosis not present

## 2017-07-24 DIAGNOSIS — M25559 Pain in unspecified hip: Secondary | ICD-10-CM | POA: Diagnosis not present

## 2017-07-24 DIAGNOSIS — H9319 Tinnitus, unspecified ear: Secondary | ICD-10-CM | POA: Diagnosis not present

## 2017-07-24 DIAGNOSIS — Z6841 Body Mass Index (BMI) 40.0 and over, adult: Secondary | ICD-10-CM | POA: Diagnosis not present

## 2017-07-24 DIAGNOSIS — H3530 Unspecified macular degeneration: Secondary | ICD-10-CM | POA: Diagnosis not present

## 2017-07-24 DIAGNOSIS — H9313 Tinnitus, bilateral: Secondary | ICD-10-CM | POA: Diagnosis not present

## 2017-07-24 DIAGNOSIS — M545 Low back pain: Secondary | ICD-10-CM | POA: Diagnosis not present

## 2017-07-24 DIAGNOSIS — E875 Hyperkalemia: Secondary | ICD-10-CM | POA: Diagnosis not present

## 2017-07-24 DIAGNOSIS — M159 Polyosteoarthritis, unspecified: Secondary | ICD-10-CM | POA: Diagnosis not present

## 2017-07-24 DIAGNOSIS — R590 Localized enlarged lymph nodes: Secondary | ICD-10-CM | POA: Diagnosis not present

## 2017-07-24 DIAGNOSIS — M25511 Pain in right shoulder: Secondary | ICD-10-CM | POA: Diagnosis not present

## 2017-07-24 DIAGNOSIS — R599 Enlarged lymph nodes, unspecified: Secondary | ICD-10-CM

## 2017-07-29 ENCOUNTER — Other Ambulatory Visit (HOSPITAL_COMMUNITY): Payer: Self-pay | Admitting: Internal Medicine

## 2017-07-29 ENCOUNTER — Ambulatory Visit (HOSPITAL_COMMUNITY)
Admission: RE | Admit: 2017-07-29 | Discharge: 2017-07-29 | Disposition: A | Discharge status: Home / Self Care | Payer: PPO | Source: Ambulatory Visit | Attending: Internal Medicine | Admitting: Internal Medicine

## 2017-07-29 DIAGNOSIS — R221 Localized swelling, mass and lump, neck: Secondary | ICD-10-CM | POA: Diagnosis not present

## 2017-07-29 DIAGNOSIS — R599 Enlarged lymph nodes, unspecified: Secondary | ICD-10-CM

## 2017-08-13 ENCOUNTER — Other Ambulatory Visit (HOSPITAL_COMMUNITY): Payer: Self-pay | Admitting: Internal Medicine

## 2017-08-13 ENCOUNTER — Ambulatory Visit (HOSPITAL_COMMUNITY)
Admission: RE | Admit: 2017-08-13 | Discharge: 2017-08-13 | Disposition: A | Discharge status: Home / Self Care | Payer: PPO | Source: Ambulatory Visit | Attending: Internal Medicine | Admitting: Internal Medicine

## 2017-08-13 DIAGNOSIS — R599 Enlarged lymph nodes, unspecified: Secondary | ICD-10-CM

## 2017-08-13 DIAGNOSIS — D171 Benign lipomatous neoplasm of skin and subcutaneous tissue of trunk: Secondary | ICD-10-CM | POA: Diagnosis not present

## 2017-08-13 DIAGNOSIS — R590 Localized enlarged lymph nodes: Secondary | ICD-10-CM | POA: Diagnosis not present

## 2017-08-13 DIAGNOSIS — N6321 Unspecified lump in the left breast, upper outer quadrant: Secondary | ICD-10-CM | POA: Insufficient documentation

## 2017-08-13 DIAGNOSIS — N6320 Unspecified lump in the left breast, unspecified quadrant: Secondary | ICD-10-CM | POA: Diagnosis not present

## 2017-08-27 ENCOUNTER — Encounter (HOSPITAL_COMMUNITY): Payer: Self-pay

## 2017-08-27 ENCOUNTER — Other Ambulatory Visit (HOSPITAL_COMMUNITY): Payer: Self-pay | Admitting: Internal Medicine

## 2017-08-27 ENCOUNTER — Ambulatory Visit (HOSPITAL_COMMUNITY)
Admission: RE | Admit: 2017-08-27 | Discharge: 2017-08-27 | Disposition: A | Discharge status: Home / Self Care | Payer: PPO | Source: Ambulatory Visit | Attending: Internal Medicine | Admitting: Internal Medicine

## 2017-08-27 DIAGNOSIS — N6321 Unspecified lump in the left breast, upper outer quadrant: Secondary | ICD-10-CM | POA: Diagnosis not present

## 2017-08-27 DIAGNOSIS — R599 Enlarged lymph nodes, unspecified: Secondary | ICD-10-CM | POA: Diagnosis not present

## 2017-08-27 DIAGNOSIS — C50412 Malignant neoplasm of upper-outer quadrant of left female breast: Secondary | ICD-10-CM | POA: Diagnosis not present

## 2017-08-27 DIAGNOSIS — N6320 Unspecified lump in the left breast, unspecified quadrant: Secondary | ICD-10-CM

## 2017-08-27 MED ORDER — LIDOCAINE-EPINEPHRINE (PF) 1 %-1:200000 IJ SOLN
INTRAMUSCULAR | Status: AC
  Administered 2017-08-27: 3 mL
  Filled 2017-08-27: qty 30

## 2017-08-27 MED ORDER — LIDOCAINE HCL (PF) 1 % IJ SOLN
INTRAMUSCULAR | Status: AC
  Filled 2017-08-27: qty 5

## 2017-09-05 ENCOUNTER — Encounter (HOSPITAL_COMMUNITY): Payer: Self-pay | Admitting: *Deleted

## 2017-09-05 DIAGNOSIS — E782 Mixed hyperlipidemia: Secondary | ICD-10-CM | POA: Diagnosis not present

## 2017-09-05 DIAGNOSIS — R739 Hyperglycemia, unspecified: Secondary | ICD-10-CM | POA: Diagnosis not present

## 2017-09-05 DIAGNOSIS — E1169 Type 2 diabetes mellitus with other specified complication: Secondary | ICD-10-CM | POA: Diagnosis not present

## 2017-09-05 DIAGNOSIS — E875 Hyperkalemia: Secondary | ICD-10-CM | POA: Diagnosis not present

## 2017-09-05 DIAGNOSIS — I10 Essential (primary) hypertension: Secondary | ICD-10-CM | POA: Diagnosis not present

## 2017-09-05 DIAGNOSIS — E119 Type 2 diabetes mellitus without complications: Secondary | ICD-10-CM | POA: Diagnosis not present

## 2017-09-05 NOTE — Progress Notes (Signed)
I called and spoke with patient, introducing myself as navigator here at the office. My direct contact number provided.  Patient advised that we are sending her to a general surgeon for consult per Dr. Delton Coombes. Patient agrees and verbalizes understanding.  We will see her back in our office 4 weeks after surgery.

## 2017-09-10 ENCOUNTER — Ambulatory Visit (HOSPITAL_COMMUNITY): Payer: PPO | Admitting: Hematology

## 2017-09-12 ENCOUNTER — Other Ambulatory Visit: Payer: Self-pay | Admitting: *Deleted

## 2017-09-12 NOTE — Patient Outreach (Signed)
Shelly [name]) Care Management  09/12/2017  Shelly [name] 02-04-1941 [id number]   Telephone Screen  Referral Date: 09/11/17  Referral Source: HTA referral from concierge  Referral Reason: recent dx with breast cancer and overwhelm, wanting assistance with setting up appointments  Insurance: HTA  Outreach attempt # 1 was successful Patient is able to verify HIPAA Reviewed and addressed referral to [facility] with patient With review of the referral Shelly [name] reports she has everything figured out. She reports getting a call from her primary MD to report to her the new breast CA dx with some appointments.  She reports one of her daughter (the one who lives in [place]) assisted her to get to the MD office she was informed to go visit on 09/10/17 to find out that she did not have an appointment.  She reports being frustrated after not getting a response from her primary MD staff about clarity of appointments and contacted HTA resulting in this referral.  She reports she called the cancer center in [place] near [place] long and received a call from Shelly [name] who she worked with in the past and all the appointments and oncology services are arranged She denies need of assist from [facility] at this time  Social: Shelly [name] reports being retired and living alone but with support of her daughters, Shelly [name] and Shelly [name].  She is independent with her care needs but is legally blind, uses a sharpie to write with to see better and no longer drives Her daughters assist with transportation    Conditions: recent dx with breast cancer, HTN, asthma, PUD, DM, osteoarthritis of left knee, hyperlipidemia, macular degeneration  Medications: she denies concerns with taking medications as prescribed, affording medications, side effects of medications and questions about medications  Appointments: 09/25/17 she is scheduled to return to the cancer center to meet her oncology team and will be called back to  be provided the surgeon ( Dr Delton Coombes) consult appointment by the oncology navigator  Advance Directives: she Denies need for assist with advance directives   Consent: [facility] RN CM reviewed [facility] services with patient. Patient gave verbal consent for services.  Plan: [facility] RN CM will send a successful out reach letter to Shelly [name] for her to review the [facility] services viewed by [facility] RN CM  CM left her CM's mobile contact number and encouraged her to call if she needs assistance in the future [facility] RN CM will close case at this time as patient has been assessed and no needs identified.     Kimberly L. Lavina Hamman, RN, BSN, [place] Coordinator Office number [phone] Mobile number [phone]  Main [facility] number [phone] Fax number [phone]

## 2017-09-17 ENCOUNTER — Telehealth: Payer: Self-pay | Admitting: Hematology and Oncology

## 2017-09-17 NOTE — Telephone Encounter (Signed)
Spoke with patient to confirm afternoon [facility] appointment for 8/14, packet mailed to patient

## 2017-09-18 ENCOUNTER — Encounter: Payer: Self-pay | Admitting: *Deleted

## 2017-09-18 DIAGNOSIS — Z170 Estrogen receptor positive status [ER+]: Principal | ICD-10-CM

## 2017-09-18 DIAGNOSIS — C50412 Malignant neoplasm of upper-outer quadrant of left female breast: Secondary | ICD-10-CM | POA: Insufficient documentation

## 2017-09-22 ENCOUNTER — Encounter: Payer: Self-pay | Admitting: General Surgery

## 2017-09-24 ENCOUNTER — Ambulatory Visit: Payer: PPO | Admitting: General Surgery

## 2017-09-25 ENCOUNTER — Ambulatory Visit
Admission: RE | Admit: 2017-09-25 | Discharge: 2017-09-25 | Disposition: A | Discharge status: Home / Self Care | Payer: PPO | Source: Ambulatory Visit | Attending: Radiation Oncology | Admitting: Radiation Oncology

## 2017-09-25 ENCOUNTER — Inpatient Hospital Stay: Payer: PPO | Attending: Hematology and Oncology | Admitting: Hematology and Oncology

## 2017-09-25 ENCOUNTER — Other Ambulatory Visit: Payer: Self-pay | Admitting: General Surgery

## 2017-09-25 ENCOUNTER — Inpatient Hospital Stay: Payer: PPO

## 2017-09-25 ENCOUNTER — Encounter: Payer: Self-pay | Admitting: General Practice

## 2017-09-25 ENCOUNTER — Encounter: Payer: Self-pay | Admitting: Hematology and Oncology

## 2017-09-25 ENCOUNTER — Ambulatory Visit: Payer: PPO | Admitting: Physical Therapy

## 2017-09-25 DIAGNOSIS — Z170 Estrogen receptor positive status [ER+]: Principal | ICD-10-CM

## 2017-09-25 DIAGNOSIS — E669 Obesity, unspecified: Secondary | ICD-10-CM | POA: Diagnosis not present

## 2017-09-25 DIAGNOSIS — C50412 Malignant neoplasm of upper-outer quadrant of left female breast: Secondary | ICD-10-CM | POA: Diagnosis not present

## 2017-09-25 DIAGNOSIS — E119 Type 2 diabetes mellitus without complications: Secondary | ICD-10-CM | POA: Insufficient documentation

## 2017-09-25 DIAGNOSIS — M1990 Unspecified osteoarthritis, unspecified site: Secondary | ICD-10-CM | POA: Diagnosis not present

## 2017-09-25 DIAGNOSIS — Z7984 Long term (current) use of oral hypoglycemic drugs: Secondary | ICD-10-CM | POA: Diagnosis not present

## 2017-09-25 DIAGNOSIS — Z8711 Personal history of peptic ulcer disease: Secondary | ICD-10-CM | POA: Diagnosis not present

## 2017-09-25 DIAGNOSIS — Z85828 Personal history of other malignant neoplasm of skin: Secondary | ICD-10-CM | POA: Diagnosis not present

## 2017-09-25 DIAGNOSIS — E785 Hyperlipidemia, unspecified: Secondary | ICD-10-CM | POA: Insufficient documentation

## 2017-09-25 DIAGNOSIS — I10 Essential (primary) hypertension: Secondary | ICD-10-CM | POA: Diagnosis not present

## 2017-09-25 DIAGNOSIS — K219 Gastro-esophageal reflux disease without esophagitis: Secondary | ICD-10-CM | POA: Insufficient documentation

## 2017-09-25 DIAGNOSIS — N6320 Unspecified lump in the left breast, unspecified quadrant: Secondary | ICD-10-CM | POA: Diagnosis not present

## 2017-09-25 DIAGNOSIS — Z96653 Presence of artificial knee joint, bilateral: Secondary | ICD-10-CM | POA: Diagnosis not present

## 2017-09-25 DIAGNOSIS — Z79899 Other long term (current) drug therapy: Secondary | ICD-10-CM | POA: Diagnosis not present

## 2017-09-25 DIAGNOSIS — H3530 Unspecified macular degeneration: Secondary | ICD-10-CM | POA: Diagnosis not present

## 2017-09-25 DIAGNOSIS — Z807 Family history of other malignant neoplasms of lymphoid, hematopoietic and related tissues: Secondary | ICD-10-CM | POA: Diagnosis not present

## 2017-09-25 LAB — CBC WITH DIFFERENTIAL (CANCER CENTER ONLY)
Basophils Absolute: 0.1 10*3/uL (ref 0.0–0.1)
Basophils Relative: 1 %
EOS PCT: 7 %
Eosinophils Absolute: 0.4 10*3/uL (ref 0.0–0.5)
HEMATOCRIT: 40.7 % (ref 34.8–46.6)
HEMOGLOBIN: 13 g/dL (ref 11.6–15.9)
LYMPHS ABS: 1.3 10*3/uL (ref 0.9–3.3)
LYMPHS PCT: 23 %
MCH: 26.6 pg (ref 25.1–34.0)
MCHC: 31.9 g/dL (ref 31.5–36.0)
MCV: 83.2 fL (ref 79.5–101.0)
Monocytes Absolute: 0.5 10*3/uL (ref 0.1–0.9)
Monocytes Relative: 8 %
NEUTROS ABS: 3.3 10*3/uL (ref 1.5–6.5)
Neutrophils Relative %: 61 %
PLATELETS: 285 10*3/uL (ref 145–400)
RBC: 4.89 MIL/uL (ref 3.70–5.45)
RDW: 14.6 % — ABNORMAL HIGH (ref 11.2–14.5)
WBC: 5.5 10*3/uL (ref 3.9–10.3)

## 2017-09-25 LAB — CMP (CANCER CENTER ONLY)
ALBUMIN: 4 g/dL (ref 3.5–5.0)
ALT: 14 U/L (ref 0–44)
ANION GAP: 10 (ref 5–15)
AST: 15 U/L (ref 15–41)
Alkaline Phosphatase: 64 U/L (ref 38–126)
BUN: 20 mg/dL (ref 8–23)
CHLORIDE: 103 mmol/L (ref 98–111)
CO2: 29 mmol/L (ref 22–32)
Calcium: 9 mg/dL (ref 8.9–10.3)
Creatinine: 0.86 mg/dL (ref 0.44–1.00)
GFR, Est AFR Am: >60 mL/min (ref >60)
GFR, Estimated: >60 mL/min (ref >60)
Glucose, Bld: 128 mg/dL — ABNORMAL HIGH (ref 70–99)
POTASSIUM: 4.4 mmol/L (ref 3.5–5.1)
Sodium: 142 mmol/L (ref 135–145)
TOTAL PROTEIN: 6.6 g/dL (ref 6.5–8.1)
Total Bilirubin: 0.4 mg/dL (ref 0.3–1.2)

## 2017-09-25 NOTE — Progress Notes (Signed)
Radiation Oncology         [phone] ________________________________  Name: Shelly [name]        MRN: [id number]  Date of Service: 09/25/2017 DOB: 09/24/1940  OJ:JKKX, Edwinna Areola, MD  Fanny Skates, MD     REFERRING PHYSICIAN: Fanny Skates, MD   DIAGNOSIS: The encounter diagnosis was Malignant neoplasm of upper-outer quadrant of left breast in female, estrogen receptor positive ([place]).   HISTORY OF PRESENT ILLNESS: Shelly [name] is a 77 y.o. female seen in the multidisciplinary breast clinic for a new diagnosis of left breast cancer. The patient was noted to have a screening detected left breast mass. Diagnostic imaging revealed a mass in the upper outer quadrant of the left breast in April 2019. She had an ultrasound on 05/28/17 that revealed a 4 x 4 x 3 mm mass at 1:00. It was felt that this could be a benign process and she returned on 08/13/17 for evaluation and at this time the lesion measured 5 x 4 x 4 mm. Her axilla was negative for adenopathy. A biopsy on 08/30/17 revealed an encapsulated papillary carcinoma, grade 2 with focal intermediate grade DCIS, ER/PR positive, HER2 negative. She comes today to discuss options of treatment for her cancer.   PREVIOUS RADIATION THERAPY: No   PAST MEDICAL HISTORY:  Past Medical History:  Diagnosis Date  . Arthritis    "qwhere" (07/01/2012)  . Asthma    normal chest x-ray in 2010  . Basal cell carcinoma of face   . Cholelithiasis   . Chronic lower back pain   . Degenerative joint disease    Right TKA-2010; low back pain; hands and hips also affected  . Exertional shortness of breath    "mostly from being too fat" (07/01/2012)  . GERD (gastroesophageal reflux disease)    history  . H/O hiatal hernia   . Heart murmur    small  . Hemorrhoid   . HTN (hypertension)   . Hyperlipidemia   . Macular degeneration    "both eyes" (07/01/2012)  . PUD (peptic ulcer disease) 2003   Upper GI bleed-gastric ulcer; gastroesophageal reflux  disease  . Seasonal allergies   . Type II diabetes mellitus ([place])        PAST SURGICAL HISTORY: Past Surgical History:  Procedure Laterality Date  . APPENDECTOMY    . CATARACT EXTRACTION BILATERAL W/ ANTERIOR VITRECTOMY Bilateral   . EYE SURGERY Bilateral    "laser in eyes to stop bleeding and injections for macular degeneration" (07/01/2012)  . FOOT SURGERY Bilateral    straighten 1st toe with a wedge.  Marland Kitchen KNEE ARTHROSCOPY Bilateral   . SKIN CANCER EXCISION  2013   "face" (07/01/2012)  . TOTAL KNEE ARTHROPLASTY Right 2011  . TOTAL KNEE ARTHROPLASTY Left 07/01/2012  . TOTAL KNEE ARTHROPLASTY Left 07/01/2012   Procedure: LEFT TOTAL KNEE ARTHROPLASTY;  Surgeon: Garald Balding, MD;  Location: [facility];  Service: Orthopedics;  Laterality: Left;  Left Total Knee Arthroplasty     FAMILY HISTORY:  Family History  Problem Relation Age of Onset  . Hodgkin's lymphoma Mother      SOCIAL HISTORY:  reports that she quit smoking about 36 years ago. Her smoking use included cigarettes. She has a 67.50 pack-year smoking history. She has never used smokeless tobacco. She reports that she does not drink alcohol or use drugs.   ALLERGIES: Celebrex [celecoxib]; Claritin [loratadine]; and Penicillins   MEDICATIONS:  Current Outpatient Medications  Medication Sig Dispense Refill  .  albuterol (PROAIR HFA) 108 (90 BASE) MCG/ACT inhaler Inhale 2 puffs into the lungs every 6 (six) hours as needed for shortness of breath.     Marland Kitchen amLODipine (NORVASC) 10 MG tablet Take 10 mg by mouth daily.      . benazepril (LOTENSIN) 40 MG tablet Take 40 mg by mouth daily.      . carvedilol (COREG) 6.25 MG tablet Take 6.25 mg by mouth 2 (two) times daily.      . cetirizine (ZYRTEC) 10 MG tablet Take 10 mg by mouth daily.     . clobetasol (TEMOVATE) 0.05 % cream Apply 1 application topically 2 (two) times daily as needed (for inflammation).     . flintstones complete (FLINTSTONES) 60 MG chewable tablet Chew 1 tablet by  mouth daily.    Marland Kitchen glipiZIDE (GLUCOTROL XL) 5 MG 24 hr tablet Take 5 mg by mouth every morning.     . methocarbamol (ROBAXIN) 500 MG tablet Take 1 tablet (500 mg total) by mouth every 8 (eight) hours as needed (spasms). (Patient not taking: Reported on 04/19/2017) 40 tablet 0  . montelukast (SINGULAIR) 10 MG tablet Take 10 mg by mouth as needed (for allergies).     . moxifloxacin (VIGAMOX) 0.5 % ophthalmic solution Place 1 drop into the right eye 3 (three) times daily. As directed for 3 days following eye injection.    . Multiple Vitamins-Minerals (PRESERVISION AREDS PO) Take 1 tablet by mouth daily.     Marland Kitchen oxyCODONE-acetaminophen (PERCOCET/ROXICET) 5-325 MG per tablet Take 1-2 tablets by mouth every 4 (four) hours as needed. (Patient not taking: Reported on 04/19/2017) 90 tablet 0  . pravastatin (PRAVACHOL) 10 MG tablet Take 10 mg by mouth daily.    . rivaroxaban (XARELTO) 10 MG TABS tablet Take 1 tablet (10 mg total) by mouth daily. 14 tablet 0  . sitaGLIPtan-metformin (JANUMET) 50-1000 MG per tablet Take 1 tablet by mouth 2 (two) times daily with a meal.    . TRICOR 145 MG tablet Take 145 mg by mouth daily.     . Turmeric 450 MG CAPS Take by mouth.     No current facility-administered medications for this encounter.      REVIEW OF SYSTEMS: On review of systems, the patient reports that she is doing well overall. She denies any chest pain, shortness of breath, cough, fevers, chills, night sweats, unintended weight changes. She denies any bowel or bladder disturbances, and denies abdominal pain, nausea or vomiting. She denies any new musculoskeletal or joint aches or pains. A complete review of systems is obtained and is otherwise negative.     PHYSICAL EXAM:  Wt Readings from Last 3 Encounters:  09/25/17 238 lb 8 oz (108.2 kg)  04/19/17 234 lb (106.1 kg)  05/14/13 241 lb (109.3 kg)   Temp Readings from Last 3 Encounters:  09/25/17 98.2 F (36.8 C) (Oral)  08/27/17 98 F (36.7 C)  07/03/12  98.3 F (36.8 C)   BP Readings from Last 3 Encounters:  09/25/17 (!) 166/84  08/27/17 (!) 155/100  04/19/17 132/77   Pulse Readings from Last 3 Encounters:  09/25/17 82  08/27/17 92  04/19/17 77     In general this is a well appearing caucasian female in no acute distress. She is alert and oriented x4 and appropriate throughout the examination. HEENT reveals that the patient is normocephalic, atraumatic. EOMs are intact.  Skin is intact without any evidence of gross lesions. Cardiopulmonary assessment is negative for acute distress and she exhibits  normal effort. Breast exam is deferred.    ECOG = 0  0 - Asymptomatic (Fully active, able to carry on all predisease activities without restriction)  1 - Symptomatic but completely ambulatory (Restricted in physically strenuous activity but ambulatory and able to carry out work of a light or sedentary nature. For example, light housework, office work)  2 - Symptomatic, <50% in bed during the day (Ambulatory and capable of all self care but unable to carry out any work activities. Up and about more than 50% of waking hours)  3 - Symptomatic, >50% in bed, but not bedbound (Capable of only limited self-care, confined to bed or chair 50% or more of waking hours)  4 - Bedbound (Completely disabled. Cannot carry on any self-care. Totally confined to bed or chair)  5 - Death   Eustace Pen MM, Creech RH, Tormey DC, et al. [phone]). "Toxicity and response criteria of the [facility]". [place] Oncol. 5 (6): 649-55    LABORATORY DATA:  Lab Results  Component Value Date   WBC 5.5 09/25/2017   HGB 13.0 09/25/2017   HCT 40.7 09/25/2017   MCV 83.2 09/25/2017   PLT 285 09/25/2017   Lab Results  Component Value Date   NA 142 09/25/2017   K 4.4 09/25/2017   CL 103 09/25/2017   CO2 29 09/25/2017   Lab Results  Component Value Date   ALT 14 09/25/2017   AST 15 09/25/2017   ALKPHOS 64 09/25/2017   BILITOT 0.4  09/25/2017      RADIOGRAPHY: Mm Clip Placement Left  Result Date: 08/27/2017 CLINICAL DATA:  Post ultrasound-guided biopsy of a mass in the left breast at the 1 o'clock position. EXAM: DIAGNOSTIC LEFT MAMMOGRAM POST ULTRASOUND BIOPSY COMPARISON:  Previous exam(s). FINDINGS: Mammographic images were obtained following ultrasound guided biopsy of a mass in the left breast at the 1 o'clock position. A ribbon shaped biopsy marking clip is present at the site of the biopsied mass in the left breast at the 1 o'clock position. IMPRESSION: Ribbon shaped biopsy marking clip at site of biopsied mass in the left breast at the 1 o'clock position. Final Assessment: Post Procedure Mammograms for Marker Placement Electronically Signed   By: Everlean Alstrom M.D.   On: 08/27/2017 13:21   [place] Lt Breast Bx W Loc Dev 1st Lesion Img Bx Spec US Guide  Addendum Date: 09/17/2017   ADDENDUM REPORT: 09/13/2017 12:11 ADDENDUM: The patient was referred to [facility] at [facility] on September 28, 2017. This referral was arranged at the request of Amber at Dr. Josue Hector office on September 12, 2017. Pathology results reported by Terie Purser, RN on 09/13/2017. Electronically Signed   By: Everlean Alstrom M.D.   On: 09/13/2017 12:11   Addendum Date: 09/03/2017   ADDENDUM REPORT: 09/03/2017 15:07 ADDENDUM: Pathology of the left breast biopsy revealed ENCAPSULATED PAPILLARY CARCINOMA OF THE BREAST, GRADE II. FOCAL DUCTAL CARCINOMA IN SITU, INTERMEDIATE NUCLEAR GRADE. This was found to be concordant by Dr. Shelly Bombard. Recommendation: Surgical and oncology referrals. Results were relayed to Dr. Juel Burrow nurse by phone and fax on 09/02/17 by Jetta Lout, [place]. She stated she will give results to Dr. [place] Crane. He will give results to the patient and make the referral. The patient was contacted by Jetta Lout, [place] on 08/30/17 for a post biopsy site check. The patient stated she had done well  following the biopsy with no bleeding or pain. Post  biopsy instructions were reviewed with the patient and all of her questions were answered. Results were unavailable at the time of conversation. I informed her that she will be contacted when the results were available. Per notes in Epic, the patient has an oncology referral to Dr. Delton Coombes on 09/10/17 at 11:10 AM. Addendum by Jetta Lout, [place] on 09/03/17. Electronically Signed   By: Everlean Alstrom M.D.   On: 09/03/2017 15:07   Result Date: 09/17/2017 CLINICAL DATA:  77 year old female with an indeterminate mass in the left breast at the 1 o'clock position. EXAM: ULTRASOUND GUIDED LEFT BREAST CORE NEEDLE BIOPSY COMPARISON:  Previous exam(s). FINDINGS: I met with the patient and we discussed the procedure of ultrasound-guided biopsy, including benefits and alternatives. We discussed the high likelihood of a successful procedure. We discussed the risks of the procedure, including infection, bleeding, tissue injury, clip migration, and inadequate sampling. Informed written consent was given. The usual time-out protocol was performed immediately prior to the procedure. Lesion quadrant: Upper-outer Using sterile technique and 1% Lidocaine as local anesthetic, under direct ultrasound visualization, a 14 gauge spring-loaded device was used to perform biopsy of the mass in the left breast at the 1 o'clock position using a lateral to medial approach. At the conclusion of the procedure a ribbon shaped tissue marker clip was deployed into the biopsy cavity. Follow up 2 view mammogram was performed and dictated separately. IMPRESSION: Ultrasound guided biopsy of the mass in the left breast at the 1 o'clock position. No apparent complications. Electronically Signed: By: Everlean Alstrom M.D. On: 08/27/2017 13:22       IMPRESSION/PLAN: 1. 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 [name] discusses the pathology  findings and reviews the nature of the type of breast disease that was identified which is a rare but favorable subtype. The consensus from the breast conference includes breast conservation with lumpectomy followed by antiestrogen therapy. At this point, [name] reviews the benefit of radiotherapy would be low yield. We discussed that we would follow up with the results of her surgical pathology to confirm this. We discussed the risks, benefits, short, and long term effects of radiotherapy if the morphology was significantly different. We will see her back as needed regarding treatment discussion.  The above documentation reflects my direct findings during this shared patient visit. Please see the separate note by [name] on this date for the remainder of the patient's plan of care.  In a visit lasting 30 minutes, greater than 50% of the time was spent face to face discussing her case, and coordinating the patient's care.     Carola Rhine, PAC

## 2017-09-25 NOTE — Progress Notes (Signed)
Nutrition Assessment  Reason for Assessment:  Pt seen in Breast Clinic  ASSESSMENT:   77 year old female with new diagnosis of breast cancer.  Past medical history of DM, HTN, High cholesterol  Patient reports normal appetite  Medications:  reviewed  Labs: glucose 128  Anthropometrics:   Height: 65 inches Weight: 238 lb BMI: 39   NUTRITION DIAGNOSIS: Food and nutrition related knowledge deficit related to new diagnosis of breast cancer as evidenced by no prior need for nutrition related information.  INTERVENTION:   Discussed and provided packet of information regarding nutritional tips for breast cancer patients.  Questions answered.  Teachback method used.  Contact information provided and patient knows to contact me with questions/concerns.    MONITORING, EVALUATION, and GOAL: Pt will consume a healthy plant based diet to maintain lean body mass throughout treatment.   Rubbie Goostree B. Zenia Resides, [place], [place] Registered Dietitian [phone] (pager)

## 2017-09-25 NOTE — Assessment & Plan Note (Signed)
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 After reviewing the pathology in detail, we proceeded to discuss the different treatment options between surgery, radiation,  antiestrogen therapies.  Recommendation: 1.  Breast conserving surgery 2. followed by adjuvant antiestrogen therapy with letrozole 2.5 mg daily x5 years. After discussing pros and cons it was felt that the patient would not benefit from adjuvant radiation.  Letrozole counseling:We discussed the risks and benefits of anti-estrogen therapy with aromatase inhibitors. These include but not limited to insomnia, hot flashes, mood changes, vaginal dryness, bone density loss, and weight gain. We strongly believe that the benefits far outweigh the risks. Patient understands these risks and consented to starting treatment. Planned treatment duration is 5 years.  Return to clinic after surgery to discuss pathology report

## 2017-09-25 NOTE — Progress Notes (Signed)
[facility] NOTE  Patient Care Team: Celene Squibb, MD as PCP - General (Internal Medicine) Rothbart, Cristopher Estimable, MD (Cardiology) Garald Balding, MD (Orthopedic Surgery) Frederik Pear, MD (Orthopedic Surgery) Derek Jack, MD as Consulting Physician (Hematology) Fanny Skates, MD as Consulting Physician (General Surgery) Nicholas Lose, MD as Consulting Physician (Hematology and Oncology) Kyung Rudd, MD as Consulting Physician (Radiation Oncology)  CHIEF COMPLAINTS/PURPOSE OF CONSULTATION:  Newly diagnosed breast cancer  HISTORY OF PRESENTING ILLNESS:  LOLITA FAULDS 77 y.o. female is here because of recent diagnosis of left breast encapsulated papillary cancer.  Patient had a mammogram in April which revealed a 0.4 cm area of abnormality at 1 o'clock position.  This was felt to be indeterminate and a six-month follow-up mammogram was recommended.  She had a mammogram on 08/13/2017 which revealed a 0.5 cm area which was suspicious and it was biopsy-proven to be encapsulated papillary cancer that was ER PR positive and HER-2 negative.  She was presented this morning to the multidisciplinary tumor board and she is here today to discuss her treatment plan.  She is accompanied by HER-2 daughters.  I reviewed her records extensively and collaborated the history with the patient.  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 Arthritis    "qwhere" (07/01/2012)  . Asthma    normal chest x-ray in 2010  . Basal cell carcinoma of face   . Cholelithiasis   .  Chronic lower back pain   . Degenerative joint disease    Right TKA-2010; low back pain; hands and hips also affected  . Exertional shortness of breath    "mostly from being too fat" (07/01/2012)  . GERD (gastroesophageal reflux disease)    history  . H/O hiatal hernia   . Heart murmur    small  . Hemorrhoid   . HTN (hypertension)   . Hyperlipidemia   . Macular degeneration    "both eyes" (07/01/2012)  . PUD (peptic ulcer disease) 2003   Upper GI bleed-gastric ulcer; gastroesophageal reflux disease  . Seasonal allergies   . Type II diabetes mellitus ([place])     SURGICAL HISTORY: Past Surgical History:  Procedure Laterality Date  . APPENDECTOMY    . CATARACT EXTRACTION BILATERAL W/ ANTERIOR VITRECTOMY Bilateral   . EYE SURGERY Bilateral    "laser in eyes to stop bleeding and injections for macular degeneration" (07/01/2012)  . FOOT SURGERY Bilateral    straighten 1st toe with a wedge.  Marland Kitchen KNEE ARTHROSCOPY Bilateral   . SKIN CANCER EXCISION  2013   "face" (07/01/2012)  . TOTAL KNEE ARTHROPLASTY Right 2011  . TOTAL KNEE ARTHROPLASTY Left 07/01/2012  . TOTAL KNEE ARTHROPLASTY Left 07/01/2012   Procedure: LEFT TOTAL KNEE ARTHROPLASTY;  Surgeon: Garald Balding, MD;  Location: [facility];  Service: Orthopedics;  Laterality: Left;  Left Total Knee Arthroplasty    SOCIAL HISTORY: Social History   Socioeconomic History  . Marital status: Divorced    Spouse name: Not on file  . Number of children: 2  . Years of  education: Not on file  . Highest education level: Not on file  Occupational History  . Not on file  Social Needs  . Financial resource strain: Not on file  . Food insecurity:    Worry: Not on file    Inability: Not on file  . Transportation needs:    Medical: Not on file    Non-medical: Not on file  Tobacco Use  . Smoking status: Former Smoker    Packs/day: 2.50    Years: 27.00    Pack years: 67.50    Types: Cigarettes    Last attempt to quit: 02/12/1981    Years  since quitting: 36.6  . Smokeless tobacco: Never Used  Substance and Sexual Activity  . Alcohol use: No  . Drug use: No  . Sexual activity: Not Currently  Lifestyle  . Physical activity:    Days per week: Not on file    Minutes per session: Not on file  . Stress: Not on file  Relationships  . Social connections:    Talks on phone: Not on file    Gets together: Not on file    Attends religious service: Not on file    Active member of club or organization: Not on file    Attends meetings of clubs or organizations: Not on file    Relationship status: Not on file  . Intimate partner violence:    Fear of current or ex partner: Not on file    Emotionally abused: Not on file    Physically abused: Not on file    Forced sexual activity: Not on file  Other Topics Concern  . Not on file  Social History Narrative   Lives locally. Works part time at the [employer].     FAMILY HISTORY: Family History  Problem Relation Age of Onset  . Hodgkin's lymphoma Mother   . Cervical cancer Sister        half sister mom's side  . Lung cancer Sister     ALLERGIES:  is allergic to celebrex [celecoxib]; claritin [loratadine]; and penicillins.  MEDICATIONS:  Current Outpatient Medications  Medication Sig Dispense Refill  . acetaminophen (TYLENOL) 325 MG tablet Take 650 mg by mouth every 6 (six) hours as needed.    Marland Kitchen albuterol (PROAIR HFA) 108 (90 BASE) MCG/ACT inhaler Inhale 2 puffs into the lungs every 6 (six) hours as needed for shortness of breath.     Marland Kitchen amLODipine (NORVASC) 10 MG tablet Take 10 mg by mouth daily.      . benazepril (LOTENSIN) 40 MG tablet Take 40 mg by mouth daily.      . carvedilol (COREG) 6.25 MG tablet Take 6.25 mg by mouth 2 (two) times daily.      . cetirizine (ZYRTEC) 10 MG tablet Take 10 mg by mouth daily.     . Cholecalciferol (VITAMIN D PO) Take 1,000 Units by mouth.    . flintstones complete (FLINTSTONES) 60 MG chewable tablet Chew 1  tablet by mouth daily.    Marland Kitchen glipiZIDE (GLUCOTROL XL) 5 MG 24 hr tablet Take 5 mg by mouth every morning.     . metFORMIN (GLUCOPHAGE) 1000 MG tablet Take 1,000 mg by mouth 2 (two) times daily with a meal.    . Multiple Vitamins-Minerals (PRESERVISION AREDS PO) Take 1 tablet by mouth 2 (two) times daily.     . pravastatin (PRAVACHOL) 10 MG tablet Take 10 mg by mouth daily.    . traMADol (ULTRAM) 50  MG tablet Take 50 mg by mouth every 6 (six) hours as needed.    . TRICOR 145 MG tablet Take 145 mg by mouth daily.     . Turmeric 450 MG CAPS Take 500 mg by mouth.      No current facility-administered medications for this visit.     REVIEW OF SYSTEMS:   Constitutional: Denies fevers, chills or abnormal night sweats Eyes: Denies blurriness of vision, double vision or watery eyes Ears, nose, mouth, throat, and face: Denies mucositis or sore throat Respiratory: Denies cough, dyspnea or wheezes Cardiovascular: Denies palpitation, chest discomfort or lower extremity swelling Gastrointestinal:  Denies nausea, heartburn or change in bowel habits Skin: Denies abnormal skin rashes Lymphatics: Denies new lymphadenopathy or easy bruising Neurological:Denies numbness, tingling or new weaknesses Behavioral/Psych: Mood is stable, no new changes  Breast:  Denies any palpable lumps or discharge All other systems were reviewed with the patient and are negative.  PHYSICAL EXAMINATION: ECOG PERFORMANCE STATUS: 1 - Symptomatic but completely ambulatory  Vitals:   09/25/17 1258  BP: (!) 166/84  Pulse: 82  Resp: 17  Temp: 98.2 F (36.8 C)  SpO2: 96%   Filed Weights   09/25/17 1258  Weight: 238 lb 8 oz (108.2 kg)    GENERAL:alert, no distress and comfortable SKIN: skin color, texture, turgor are normal, no rashes or significant lesions EYES: normal, conjunctiva are pink and non-injected, sclera clear OROPHARYNX:no exudate, no erythema and lips, buccal mucosa, and tongue normal  NECK: supple,  thyroid normal size, non-tender, without nodularity LYMPH:  no palpable lymphadenopathy in the cervical, axillary or inguinal LUNGS: clear to auscultation and percussion with normal breathing effort HEART: regular rate & rhythm and no murmurs and no lower extremity edema ABDOMEN:abdomen soft, non-tender and normal bowel sounds Musculoskeletal:no cyanosis of digits and no clubbing  PSYCH: alert & oriented x 3 with fluent speech NEURO: no focal motor/sensory deficits   LABORATORY DATA:  I have reviewed the data as listed Lab Results  Component Value Date   WBC 5.5 09/25/2017   HGB 13.0 09/25/2017   HCT 40.7 09/25/2017   MCV 83.2 09/25/2017   PLT 285 09/25/2017   Lab Results  Component Value Date   NA 142 09/25/2017   K 4.4 09/25/2017   CL 103 09/25/2017   CO2 29 09/25/2017    RADIOGRAPHIC STUDIES: I have personally reviewed the radiological reports and agreed with the findings in the report.  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 After reviewing the pathology in detail, we proceeded to discuss the different treatment options between surgery, radiation,  antiestrogen therapies.  Recommendation: 1.  Breast conserving surgery 2. followed by adjuvant antiestrogen therapy with letrozole 2.5 mg daily x5 years. After discussing pros and cons it was felt that the patient would not benefit from adjuvant radiation.  Letrozole counseling:We  discussed the risks and benefits of anti-estrogen therapy with aromatase inhibitors. These include but not limited to insomnia, hot flashes, mood changes, vaginal dryness, bone density loss, and  weight gain. We strongly believe that the benefits far outweigh the risks. Patient understands these risks and consented to starting treatment. Planned treatment duration is 5 years.  Return to clinic after surgery to discuss pathology report   All questions were answered. The patient knows to call the clinic with any problems, questions or concerns.    Harriette Ohara, MD 09/25/17

## 2017-09-27 NOTE — Progress Notes (Signed)
[facility] Psychosocial Distress Screening Spiritual Care  Met with Cindia and her two daughters in [facility] to introduce [facility] team/resources, reviewing distress screen per protocol.  The patient scored a 5 on the Psychosocial Distress Thermometer which indicates moderate distress. Also assessed for distress and other psychosocial needs.   ONCBCN DISTRESS SCREENING 09/27/2017  Screening Type Initial Screening  Distress experienced in past week (1-10) 5  Practical problem type Housing  Emotional problem type Nervousness/Anxiety  Physical Problem type Pain  Referral to support programs Yes   Averyana was "in really good spirits" during this encounter. She and her daughters are close and jovial, making friendly jokes and teasing each other in love and relief. Per pt, she has good support from family, which is esp important because she is legally blind and thus needs rides. Per family, they are very appreciative to be seen at [facility] and have confidence in their team and plan.  Follow up needed: No.  Per pt, no other needs at this time, but please also page if needs arise or circumstances change. Thank you.  [place], [place], [facility] Pager [phone] Voicemail [phone]

## 2017-10-01 ENCOUNTER — Other Ambulatory Visit: Payer: Self-pay | Admitting: General Surgery

## 2017-10-01 ENCOUNTER — Telehealth: Payer: Self-pay | Admitting: *Deleted

## 2017-10-01 DIAGNOSIS — C50412 Malignant neoplasm of upper-outer quadrant of left female breast: Secondary | ICD-10-CM

## 2017-10-01 DIAGNOSIS — Z170 Estrogen receptor positive status [ER+]: Principal | ICD-10-CM

## 2017-10-01 NOTE — Telephone Encounter (Signed)
  Oncology Nurse Navigator Documentation  Navigator Location: [facility] (10/01/17 1200)   )Navigator Encounter Type: Telephone;[facility] Follow-up (10/01/17 1200) Telephone: Outgoing Call;Clinic/[facility] Follow-up (10/01/17 1200)     Surgery Date: 10/21/17 (10/01/17 1200)                                            Time Spent with Patient: 15 (10/01/17 1200)

## 2017-10-07 ENCOUNTER — Telehealth: Payer: Self-pay | Admitting: Hematology and Oncology

## 2017-10-07 NOTE — Telephone Encounter (Signed)
Scheduled appt per 8/22 sch message - pt is aware of appt date and time.

## 2017-10-15 ENCOUNTER — Ambulatory Visit (HOSPITAL_COMMUNITY): Payer: PPO | Admitting: Hematology

## 2017-10-16 ENCOUNTER — Encounter (HOSPITAL_BASED_OUTPATIENT_CLINIC_OR_DEPARTMENT_OTHER): Payer: Self-pay

## 2017-10-16 ENCOUNTER — Other Ambulatory Visit: Payer: Self-pay

## 2017-10-17 ENCOUNTER — Encounter (HOSPITAL_COMMUNITY)
Admission: RE | Admit: 2017-10-17 | Discharge: 2017-10-17 | Disposition: A | Discharge status: Home / Self Care | Payer: PPO | Source: Ambulatory Visit | Attending: General Surgery | Admitting: General Surgery

## 2017-10-17 DIAGNOSIS — E119 Type 2 diabetes mellitus without complications: Secondary | ICD-10-CM | POA: Insufficient documentation

## 2017-10-17 DIAGNOSIS — I10 Essential (primary) hypertension: Secondary | ICD-10-CM | POA: Insufficient documentation

## 2017-10-17 DIAGNOSIS — R9431 Abnormal electrocardiogram [ECG] [EKG]: Secondary | ICD-10-CM | POA: Diagnosis not present

## 2017-10-17 DIAGNOSIS — Z01810 Encounter for preprocedural cardiovascular examination: Secondary | ICD-10-CM | POA: Insufficient documentation

## 2017-10-18 ENCOUNTER — Ambulatory Visit
Admission: RE | Admit: 2017-10-18 | Discharge: 2017-10-18 | Disposition: A | Discharge status: Home / Self Care | Payer: PPO | Source: Ambulatory Visit | Attending: General Surgery | Admitting: General Surgery

## 2017-10-18 DIAGNOSIS — Z170 Estrogen receptor positive status [ER+]: Principal | ICD-10-CM

## 2017-10-18 DIAGNOSIS — C50412 Malignant neoplasm of upper-outer quadrant of left female breast: Secondary | ICD-10-CM

## 2017-10-20 NOTE — H&P (Signed)
Shelly [name] Location: [facility] Patient #: [id number] DOB: 10-Aug-1940 Undefined / Language: Shelly [name] / Race: White Female        History of Present Illness      This is a 77 year old female, referred by [name] at the [facility] for evaluation of invasive cancer left breast upper outer quadrant. Her PCP is Celene Squibb. She is seen in the [facility] by [name], [name], and me. Her 2 daughters are with her throughout the encounter      Six-month follow-up left mammogram revealed category B density, 5 mm mass left breast, 1 o'clock position, 5 cm from nipple. She has a lipoma at the 3 o'clock position of the left breast. Ultrasound of left axilla is negative. 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 ER 95%. PR 75%. Dr. [name] as this an invasive cancer but is very indolent and low metastatic potential. She is interested in breast conservation. I think she is a good candidate for that.     Past history reveals that she is overweight. Remote history of peptic ulcer disease. Bilateral total knee replacement. History of hiatal hernia. Hypertension. Non-insulin-dependent diabetes mellitus. Appendectomy. Has macular degeneration. Has had cataract surgery. She is legally blind and does not drive. Family history is negative for breast or ovarian cancer. One sister had cervical and colon cancer. No prostate cancer. 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 She is divorced. Has 2 children that are with her today. alcohol rarely. Quit smoking 1987.      We had a long discussion. She is interested in breast conservation if it is appropriate. I agree that this is a good idea. There is no survival benefit to mastectomy We do not believe that she would require or benefit from sentinel node biopsy. She will be scheduled  for left breast lumpectomy with radioactive seed localization.  There is no indication for sentinel lymph node biopsy. [name] plans radiation therapy, followed by antiestrogen therapy  I have discussed the indications, details, techniques, and numerous risk of the surgery with them all. She is aware of the risk of bleeding, infection, cosmetic deformity, reoperation for positive margins, nerve damage with chronic pain or numbness. She understands all of these issues. All her questions were answered. She agrees with this plan.   Past Surgical History  Appendectomy  Breast Biopsy  Left. Foot Surgery  Bilateral.  Diagnostic Studies History  Mammogram  within last year Pap Smear  >5 years ago  Medication History  Medications Reconciled  Social History  Alcohol use  Occasional alcohol use. Caffeine use  Carbonated beverages. Tobacco use  Former smoker.  Family History  Cervical Cancer  Sister. Respiratory Condition  Mother.  Pregnancy / Birth History  Gravida  2 Para  2  Other Problems  Arthritis  Hypercholesterolemia  Melanoma     Review of Systems  General Present- Fatigue and Night Sweats. Not Present- Appetite Loss, Chills, Fever, Weight Gain and Weight Loss. Skin Present- Dryness. Not Present- Change in Wart/Mole, Hives, Jaundice, New Lesions, Non-Healing Wounds, Rash and Ulcer. HEENT Present- Seasonal Allergies. Not Present- Earache, Hearing Loss, Hoarseness, Nose Bleed, Oral Ulcers, Ringing in the Ears, Sinus Pain, Sore Throat, Visual Disturbances, Wears glasses/contact lenses and Yellow Eyes. Gastrointestinal Not Present- Abdominal Pain, Bloating, Bloody Stool, Change in Bowel Habits, Chronic diarrhea, Constipation, Difficulty Swallowing, Excessive gas, Gets  full quickly at meals, Hemorrhoids, Indigestion, Nausea, Rectal Pain and Vomiting. Musculoskeletal Present- Back Pain and Joint Pain. Not Present- Joint Stiffness, Muscle Pain, Muscle  Weakness and Swelling of Extremities. Endocrine Not Present- Cold Intolerance, Excessive Hunger, Hair Changes, Heat Intolerance, Hot flashes and New Diabetes.   Physical Exam General Mental Status-Alert. General Appearance-Consistent with stated age. Hydration-Well hydrated. Voice-Normal. Note: Pleasant. Obese. She can read riding and understand pictures if I use a bold sharpie pen.   Head and Neck Head-normocephalic, atraumatic with no lesions or palpable masses. Trachea-midline. Thyroid Gland Characteristics - normal size and consistency.  Eye Eyeball - Bilateral-Extraocular movements intact. Sclera/Conjunctiva - Bilateral-No scleral icterus.  Chest and Lung Exam Chest and lung exam reveals -quiet, even and easy respiratory effort with no use of accessory muscles and on auscultation, normal breath sounds, no adventitious sounds and normal vocal resonance. Inspection Chest Wall - Normal. Back - normal.  Breast Note: Breasts are very large. Pendulous. No palpable mass. Lots of fatty tissue laterally in both breasts. I do not feel a discrete lipoma. Nipple and areolar complex is normal. No palpable axillary mass.   Cardiovascular Cardiovascular examination reveals -normal heart sounds, regular rate and rhythm with no murmurs and normal pedal pulses bilaterally.  Abdomen Inspection Inspection of the abdomen reveals - No Hernias. Skin - Scar - no surgical scars. Palpation/Percussion Palpation and Percussion of the abdomen reveal - Soft, Non Tender, No Rebound tenderness, No Rigidity (guarding) and No hepatosplenomegaly. Auscultation Auscultation of the abdomen reveals - Bowel sounds normal.  Neurologic Neurologic evaluation reveals -alert and oriented x 3 with no impairment of recent or remote memory. Mental Status-Normal.  Musculoskeletal Normal Exam - Left-Upper Extremity Strength Normal and Lower Extremity Strength Normal. Normal Exam -  Right-Upper Extremity Strength Normal and Lower Extremity Strength Normal. Note: Bilateral knee scars well-healed   Lymphatic Head & Neck  General Head & Neck Lymphatics: Bilateral - Description - Normal. Axillary  General Axillary Region: Bilateral - Description - Normal. Tenderness - Non Tender. Femoral & Inguinal  Generalized Femoral & Inguinal Lymphatics: Bilateral - Description - Normal. Tenderness - Non Tender.    Assessment & Plan PRIMARY CANCER OF UPPER OUTER QUADRANT OF LEFT FEMALE BREAST (C50.412)   Your recent imaging studies and biopsy show a small cancer in the left breast upper outer quadrant. The cancer is described as 5 mm in diameter, left breast 1 o'clock position, 5 cm from the nipple Ultrasound of the left axilla is negative The cancer is a low-grade papillary carcinoma. Estrogen and progesterone receptors are positive. HER-2/neu is negative.  All of the doctors have discussed your treatment plan I have discussed the difference between lumpectomy and mastectomy and the advantages and disadvantages of each. We do not think that you need lymph node biopsy We have decided to schedule you for left breast lumpectomy with radioactive seed localization I discussed the indications, techniques, and numerous risk of the surgery with you and her 2 daughters  [facility]  HYPERTENSION, BENIGN (I10) TYPE 2 DIABETES MELLITUS TREATED WITHOUT INSULIN (E11.9) TOTAL KNEE REPLACEMENT STATUS, BILATERAL (Z96.653) MACULAR DEGENERATION, BILATERAL (H35.30) Impression: Legally blind and cannot drive OBESITY (BMI 91-50.5) (E66.9)    Zaul Hubers M. Dalbert Batman, M.D., [facility] General and Minimally invasive Surgery Breast and Colorectal Surgery Office:   [phone] Pager:   [phone]

## 2017-10-21 ENCOUNTER — Other Ambulatory Visit: Payer: Self-pay

## 2017-10-21 ENCOUNTER — Ambulatory Visit
Admission: RE | Admit: 2017-10-21 | Discharge: 2017-10-21 | Disposition: A | Discharge status: Home / Self Care | Payer: PPO | Source: Ambulatory Visit | Attending: General Surgery | Admitting: General Surgery

## 2017-10-21 ENCOUNTER — Ambulatory Visit (HOSPITAL_BASED_OUTPATIENT_CLINIC_OR_DEPARTMENT_OTHER): Payer: PPO | Admitting: Certified Registered"

## 2017-10-21 ENCOUNTER — Encounter (HOSPITAL_BASED_OUTPATIENT_CLINIC_OR_DEPARTMENT_OTHER)
Admission: RE | Disposition: A | Discharge status: Home / Self Care | Payer: Self-pay | Source: Ambulatory Visit | Attending: General Surgery

## 2017-10-21 ENCOUNTER — Encounter (HOSPITAL_BASED_OUTPATIENT_CLINIC_OR_DEPARTMENT_OTHER): Payer: Self-pay | Admitting: Certified Registered"

## 2017-10-21 ENCOUNTER — Ambulatory Visit (HOSPITAL_BASED_OUTPATIENT_CLINIC_OR_DEPARTMENT_OTHER)
Admission: RE | Admit: 2017-10-21 | Discharge: 2017-10-21 | Disposition: A | Discharge status: Home / Self Care | Payer: PPO | Source: Ambulatory Visit | Attending: General Surgery | Admitting: General Surgery

## 2017-10-21 DIAGNOSIS — E7800 Pure hypercholesterolemia, unspecified: Secondary | ICD-10-CM | POA: Insufficient documentation

## 2017-10-21 DIAGNOSIS — Z880 Allergy status to penicillin: Secondary | ICD-10-CM | POA: Insufficient documentation

## 2017-10-21 DIAGNOSIS — C50412 Malignant neoplasm of upper-outer quadrant of left female breast: Secondary | ICD-10-CM

## 2017-10-21 DIAGNOSIS — Z885 Allergy status to narcotic agent status: Secondary | ICD-10-CM | POA: Diagnosis not present

## 2017-10-21 DIAGNOSIS — M1990 Unspecified osteoarthritis, unspecified site: Secondary | ICD-10-CM | POA: Insufficient documentation

## 2017-10-21 DIAGNOSIS — D242 Benign neoplasm of left breast: Secondary | ICD-10-CM | POA: Diagnosis not present

## 2017-10-21 DIAGNOSIS — Z87891 Personal history of nicotine dependence: Secondary | ICD-10-CM | POA: Insufficient documentation

## 2017-10-21 DIAGNOSIS — Z170 Estrogen receptor positive status [ER+]: Secondary | ICD-10-CM | POA: Diagnosis not present

## 2017-10-21 DIAGNOSIS — Z79899 Other long term (current) drug therapy: Secondary | ICD-10-CM | POA: Diagnosis not present

## 2017-10-21 DIAGNOSIS — Z8711 Personal history of peptic ulcer disease: Secondary | ICD-10-CM | POA: Diagnosis not present

## 2017-10-21 DIAGNOSIS — Z7984 Long term (current) use of oral hypoglycemic drugs: Secondary | ICD-10-CM | POA: Diagnosis not present

## 2017-10-21 DIAGNOSIS — Z6838 Body mass index (BMI) 38.0-38.9, adult: Secondary | ICD-10-CM | POA: Diagnosis not present

## 2017-10-21 DIAGNOSIS — Z85820 Personal history of malignant melanoma of skin: Secondary | ICD-10-CM | POA: Insufficient documentation

## 2017-10-21 DIAGNOSIS — Z881 Allergy status to other antibiotic agents status: Secondary | ICD-10-CM | POA: Insufficient documentation

## 2017-10-21 DIAGNOSIS — E119 Type 2 diabetes mellitus without complications: Secondary | ICD-10-CM | POA: Diagnosis not present

## 2017-10-21 DIAGNOSIS — H3530 Unspecified macular degeneration: Secondary | ICD-10-CM | POA: Insufficient documentation

## 2017-10-21 DIAGNOSIS — I10 Essential (primary) hypertension: Secondary | ICD-10-CM | POA: Insufficient documentation

## 2017-10-21 DIAGNOSIS — C50912 Malignant neoplasm of unspecified site of left female breast: Secondary | ICD-10-CM | POA: Diagnosis not present

## 2017-10-21 DIAGNOSIS — H548 Legal blindness, as defined in USA: Secondary | ICD-10-CM | POA: Diagnosis not present

## 2017-10-21 DIAGNOSIS — Z96653 Presence of artificial knee joint, bilateral: Secondary | ICD-10-CM | POA: Diagnosis not present

## 2017-10-21 HISTORY — PX: BREAST LUMPECTOMY WITH RADIOACTIVE SEED LOCALIZATION: SHX6424

## 2017-10-21 LAB — GLUCOSE, CAPILLARY
GLUCOSE-CAPILLARY: 164 mg/dL — AB (ref 70–99)
Glucose-Capillary: 154 mg/dL — ABNORMAL HIGH (ref 70–99)

## 2017-10-21 SURGERY — BREAST LUMPECTOMY WITH RADIOACTIVE SEED LOCALIZATION
Anesthesia: General | Site: Breast | Laterality: Left

## 2017-10-21 MED ORDER — GABAPENTIN 300 MG PO CAPS
300.0000 mg | ORAL_CAPSULE | ORAL | Status: AC
  Administered 2017-10-21: 300 mg via ORAL

## 2017-10-21 MED ORDER — ACETAMINOPHEN 500 MG PO TABS
1000.0000 mg | ORAL_TABLET | ORAL | Status: AC
  Administered 2017-10-21: 1000 mg via ORAL

## 2017-10-21 MED ORDER — ACETAMINOPHEN 500 MG PO TABS
ORAL_TABLET | ORAL | Status: AC
  Filled 2017-10-21: qty 2

## 2017-10-21 MED ORDER — SODIUM CHLORIDE 0.9 % IJ SOLN
INTRAMUSCULAR | Status: AC
  Filled 2017-10-21: qty 20

## 2017-10-21 MED ORDER — PROMETHAZINE HCL 25 MG/ML IJ SOLN: 6.2500 mg | INTRAMUSCULAR | Status: DC | PRN

## 2017-10-21 MED ORDER — HYDROMORPHONE HCL 1 MG/ML IJ SOLN: 0.2500 mg | INTRAMUSCULAR | Status: DC | PRN

## 2017-10-21 MED ORDER — CEFAZOLIN SODIUM-DEXTROSE 2-4 GM/100ML-% IV SOLN
INTRAVENOUS | Status: AC
  Filled 2017-10-21: qty 100

## 2017-10-21 MED ORDER — CHLORHEXIDINE GLUCONATE CLOTH 2 % EX PADS: 6.0000 | MEDICATED_PAD | Freq: Once | CUTANEOUS | Status: DC

## 2017-10-21 MED ORDER — BUPIVACAINE-EPINEPHRINE (PF) 0.5% -1:200000 IJ SOLN
INTRAMUSCULAR | Status: DC | PRN
  Administered 2017-10-21: 20 mL

## 2017-10-21 MED ORDER — LIDOCAINE HCL (CARDIAC) PF 100 MG/5ML IV SOSY
PREFILLED_SYRINGE | INTRAVENOUS | Status: DC | PRN
  Administered 2017-10-21: 60 mg via INTRAVENOUS

## 2017-10-21 MED ORDER — EPHEDRINE SULFATE 50 MG/ML IJ SOLN
INTRAMUSCULAR | Status: DC | PRN
  Administered 2017-10-21: 10 mg via INTRAVENOUS

## 2017-10-21 MED ORDER — MEPERIDINE HCL 25 MG/ML IJ SOLN: 6.2500 mg | INTRAMUSCULAR | Status: DC | PRN

## 2017-10-21 MED ORDER — GABAPENTIN 300 MG PO CAPS
ORAL_CAPSULE | ORAL | Status: AC
  Filled 2017-10-21: qty 1

## 2017-10-21 MED ORDER — ONDANSETRON HCL 4 MG/2ML IJ SOLN
INTRAMUSCULAR | Status: DC | PRN
  Administered 2017-10-21: 4 mg via INTRAVENOUS

## 2017-10-21 MED ORDER — METHYLENE BLUE 0.5 % INJ SOLN
INTRAVENOUS | Status: AC
  Filled 2017-10-21: qty 20

## 2017-10-21 MED ORDER — MIDAZOLAM HCL 2 MG/2ML IJ SOLN: 1.0000 mg | INTRAMUSCULAR | Status: DC | PRN

## 2017-10-21 MED ORDER — BUPIVACAINE-EPINEPHRINE (PF) 0.5% -1:200000 IJ SOLN
INTRAMUSCULAR | Status: AC
  Filled 2017-10-21: qty 120

## 2017-10-21 MED ORDER — FENTANYL CITRATE (PF) 100 MCG/2ML IJ SOLN
INTRAMUSCULAR | Status: AC
  Filled 2017-10-21: qty 2

## 2017-10-21 MED ORDER — SCOPOLAMINE 1 MG/3DAYS TD PT72: 1.0000 | MEDICATED_PATCH | Freq: Once | TRANSDERMAL | Status: DC | PRN

## 2017-10-21 MED ORDER — CEFAZOLIN SODIUM-DEXTROSE 2-4 GM/100ML-% IV SOLN
2.0000 g | INTRAVENOUS | Status: AC
  Administered 2017-10-21: 2 g via INTRAVENOUS

## 2017-10-21 MED ORDER — FENTANYL CITRATE (PF) 100 MCG/2ML IJ SOLN
50.0000 ug | INTRAMUSCULAR | Status: DC | PRN
  Administered 2017-10-21: 50 ug via INTRAVENOUS

## 2017-10-21 MED ORDER — LACTATED RINGERS IV SOLN
INTRAVENOUS | Status: DC
  Administered 2017-10-21: 07:00:00 via INTRAVENOUS

## 2017-10-21 MED ORDER — DEXAMETHASONE SODIUM PHOSPHATE 4 MG/ML IJ SOLN
INTRAMUSCULAR | Status: DC | PRN
  Administered 2017-10-21: 4 mg via INTRAVENOUS

## 2017-10-21 MED ORDER — PROPOFOL 10 MG/ML IV BOLUS
INTRAVENOUS | Status: DC | PRN
  Administered 2017-10-21: 100 mg via INTRAVENOUS

## 2017-10-21 SURGICAL SUPPLY — 66 items
ADH SKN CLS APL DERMABOND .7 (GAUZE/BANDAGES/DRESSINGS) ×1
APL SKNCLS STERI-STRIP NONHPOA (GAUZE/BANDAGES/DRESSINGS)
APPLIER CLIP 9.375 MED OPEN (MISCELLANEOUS) ×3
APR CLP MED 9.3 20 MLT OPN (MISCELLANEOUS) ×1
BENZOIN TINCTURE PRP APPL 2/3 (GAUZE/BANDAGES/DRESSINGS) IMPLANT
BINDER BREAST LRG (GAUZE/BANDAGES/DRESSINGS) IMPLANT
BINDER BREAST MEDIUM (GAUZE/BANDAGES/DRESSINGS) IMPLANT
BINDER BREAST XLRG (GAUZE/BANDAGES/DRESSINGS) IMPLANT
BINDER BREAST XXLRG (GAUZE/BANDAGES/DRESSINGS) ×2 IMPLANT
BLADE HEX COATED 2.75 (ELECTRODE) ×3 IMPLANT
BLADE SURG 10 STRL SS (BLADE) IMPLANT
BLADE SURG 15 STRL LF DISP TIS (BLADE) ×1 IMPLANT
BLADE SURG 15 STRL SS (BLADE) ×3
CANISTER SUC SOCK COL 7IN (MISCELLANEOUS) IMPLANT
CANISTER SUCT 1200ML W/VALVE (MISCELLANEOUS) ×3 IMPLANT
CHLORAPREP W/TINT 26ML (MISCELLANEOUS) ×3 IMPLANT
CLIP APPLIE 9.375 MED OPEN (MISCELLANEOUS) IMPLANT
CLOSURE WOUND 1/2 X4 (GAUZE/BANDAGES/DRESSINGS)
COVER BACK TABLE 60X90IN (DRAPES) ×3 IMPLANT
COVER MAYO STAND STRL (DRAPES) ×3 IMPLANT
COVER PROBE W GEL 5X96 (DRAPES) ×3 IMPLANT
DECANTER SPIKE VIAL GLASS SM (MISCELLANEOUS) IMPLANT
DERMABOND ADVANCED (GAUZE/BANDAGES/DRESSINGS) ×2
DERMABOND ADVANCED .7 DNX12 (GAUZE/BANDAGES/DRESSINGS) ×1 IMPLANT
DEVICE DUBIN W/COMP PLATE 8390 (MISCELLANEOUS) ×3 IMPLANT
DRAPE LAPAROSCOPIC ABDOMINAL (DRAPES) ×3 IMPLANT
DRAPE UTILITY XL STRL (DRAPES) ×3 IMPLANT
DRSG PAD ABDOMINAL 8X10 ST (GAUZE/BANDAGES/DRESSINGS) ×2 IMPLANT
ELECT REM PT RETURN 9FT ADLT (ELECTROSURGICAL) ×3
ELECTRODE REM PT RTRN 9FT ADLT (ELECTROSURGICAL) ×1 IMPLANT
GAUZE SPONGE 4X4 12PLY STRL LF (GAUZE/BANDAGES/DRESSINGS) ×2 IMPLANT
GLOVE BIO SURGEON STRL SZ 6.5 (GLOVE) ×1 IMPLANT
GLOVE BIO SURGEONS STRL SZ 6.5 (GLOVE) ×1
GLOVE BIOGEL PI IND STRL 7.0 (GLOVE) IMPLANT
GLOVE BIOGEL PI INDICATOR 7.0 (GLOVE) ×2
GLOVE EUDERMIC 7 POWDERFREE (GLOVE) ×6 IMPLANT
GOWN STRL REUS W/ TWL LRG LVL3 (GOWN DISPOSABLE) ×1 IMPLANT
GOWN STRL REUS W/ TWL XL LVL3 (GOWN DISPOSABLE) ×1 IMPLANT
GOWN STRL REUS W/TWL LRG LVL3 (GOWN DISPOSABLE) ×3
GOWN STRL REUS W/TWL XL LVL3 (GOWN DISPOSABLE) ×3
ILLUMINATOR WAVEGUIDE N/F (MISCELLANEOUS) IMPLANT
KIT MARKER MARGIN INK (KITS) ×3 IMPLANT
LIGHT WAVEGUIDE WIDE FLAT (MISCELLANEOUS) IMPLANT
NEEDLE HYPO 25X1 1.5 SAFETY (NEEDLE) ×3 IMPLANT
NS IRRIG 1000ML POUR BTL (IV SOLUTION) ×3 IMPLANT
PACK BASIN DAY SURGERY FS (CUSTOM PROCEDURE TRAY) ×3 IMPLANT
PENCIL BUTTON HOLSTER BLD 10FT (ELECTRODE) ×3 IMPLANT
SHEET MEDIUM DRAPE 40X70 STRL (DRAPES) IMPLANT
SLEEVE SCD COMPRESS KNEE MED (MISCELLANEOUS) ×3 IMPLANT
SPONGE LAP 18X18 RF (DISPOSABLE) IMPLANT
SPONGE LAP 4X18 RFD (DISPOSABLE) ×3 IMPLANT
STRIP CLOSURE SKIN 1/2X4 (GAUZE/BANDAGES/DRESSINGS) IMPLANT
SUT ETHILON 3 0 FSL (SUTURE) IMPLANT
SUT MNCRL AB 4-0 PS2 18 (SUTURE) ×3 IMPLANT
SUT SILK 2 0 SH (SUTURE) ×3 IMPLANT
SUT VIC AB 2-0 CT1 27 (SUTURE)
SUT VIC AB 2-0 CT1 TAPERPNT 27 (SUTURE) IMPLANT
SUT VIC AB 3-0 SH 27 (SUTURE)
SUT VIC AB 3-0 SH 27X BRD (SUTURE) IMPLANT
SUT VICRYL 3-0 CR8 SH (SUTURE) ×3 IMPLANT
SYR 10ML LL (SYRINGE) ×3 IMPLANT
TOWEL GREEN STERILE FF (TOWEL DISPOSABLE) ×3 IMPLANT
TOWEL OR NON WOVEN STRL DISP B (DISPOSABLE) IMPLANT
TUBE CONNECTING 20'X1/4 (TUBING) ×1
TUBE CONNECTING 20X1/4 (TUBING) ×2 IMPLANT
YANKAUER SUCT BULB TIP NO VENT (SUCTIONS) ×3 IMPLANT

## 2017-10-21 NOTE — Interval H&P Note (Signed)
History and Physical Interval Note:  10/21/2017 6:29 AM  Shelly [name]  has presented today for surgery, with the diagnosis of left breast cancer  The various methods of treatment have been discussed with the patient and family. After consideration of risks, benefits and other options for treatment, the patient has consented to  Procedure(s): BREAST LUMPECTOMY WITH RADIOACTIVE SEED LOCALIZATION (Left) as a surgical intervention .  The patient's history has been reviewed, patient examined, no change in status, stable for surgery.  I have reviewed the patient's chart and labs.  Questions were answered to the patient's satisfaction.     [name]

## 2017-10-21 NOTE — Anesthesia Preprocedure Evaluation (Signed)
Anesthesia Evaluation  Patient identified by MRN, date of birth, ID band Patient awake    Reviewed: Allergy & Precautions, H&P , NPO status , Patient's Chart, lab work & pertinent test results  Airway Mallampati: II  TM Distance: >3 FB Neck ROM: full    Dental no notable dental hx.    Pulmonary shortness of breath, asthma , former smoker,    Pulmonary exam normal breath sounds clear to auscultation       Cardiovascular hypertension, Normal cardiovascular exam Rhythm:Regular Rate:Normal     Neuro/Psych    GI/Hepatic hiatal hernia, PUD, GERD  ,  Endo/Other  diabetes, Type 2Morbid obesity  Renal/GU      Musculoskeletal   Abdominal (+) + obese,   Peds  Hematology   Anesthesia Other Findings Breast cancer  Reproductive/Obstetrics                             Anesthesia Physical  Anesthesia Plan  ASA: III  Anesthesia Plan: General   Post-op Pain Management:    Induction: Intravenous  PONV Risk Score and Plan: 3 and Ondansetron, Dexamethasone, Midazolam and Treatment may vary due to age or medical condition  Airway Management Planned: LMA  Additional Equipment:   Intra-op Plan:   Post-operative Plan: Extubation in OR  Informed Consent: I have reviewed the patients History and Physical, chart, labs and discussed the procedure including the risks, benefits and alternatives for the proposed anesthesia with the patient or authorized representative who has indicated his/her understanding and acceptance.     Plan Discussed with: CRNA, Anesthesiologist and Surgeon  Anesthesia Plan Comments:         Anesthesia Quick Evaluation

## 2017-10-21 NOTE — Anesthesia Procedure Notes (Signed)
Procedure Name: LMA Insertion Date/Time: 10/21/2017 7:30 AM Performed by: Signe Colt, CRNA Pre-anesthesia Checklist: Patient identified, Emergency Drugs available, Suction available and Patient being monitored Patient Re-evaluated:Patient Re-evaluated prior to induction Oxygen Delivery Method: Circle system utilized Preoxygenation: Pre-oxygenation with 100% oxygen Induction Type: IV induction Ventilation: Mask ventilation without difficulty LMA: LMA inserted LMA Size: 4.0 Number of attempts: 1 Airway Equipment and Method: Bite block Placement Confirmation: positive ETCO2 Tube secured with: Tape Dental Injury: Teeth and Oropharynx as per pre-operative assessment

## 2017-10-21 NOTE — Discharge Instructions (Signed)
[facility] °Office Phone Number [phone] ° °BREAST BIOPSY/ PARTIAL MASTECTOMY: POST OP INSTRUCTIONS ° °Always review your discharge instruction sheet given to you by the facility where your surgery was performed. ° °IF YOU HAVE DISABILITY OR FAMILY LEAVE FORMS, YOU MUST BRING THEM TO THE OFFICE FOR PROCESSING.  DO NOT GIVE THEM TO YOUR DOCTOR. ° °1. A prescription for pain medication may be given to you upon discharge.  Take your pain medication as prescribed, if needed.  If narcotic pain medicine is not needed, then you may take acetaminophen (Tylenol) or ibuprofen (Advil) as needed. °2. Take your usually prescribed medications unless otherwise directed °3. If you need a refill on your pain medication, please contact your pharmacy.  They will contact our office to request authorization.  Prescriptions will not be filled after 5pm or on week-ends. °4. You should eat very light the first 24 hours after surgery, such as soup, crackers, pudding, etc.  Resume your normal diet the day after surgery. °5. Most patients will experience some swelling and bruising in the breast.  Ice packs and a good support bra will help.  Swelling and bruising can take several days to resolve.  °6. It is common to experience some constipation if taking pain medication after surgery.  Increasing fluid intake and taking a stool softener will usually help or prevent this problem from occurring.  A mild laxative (Milk of Magnesia or Miralax) should be taken according to package directions if there are no bowel movements after 48 hours. °7. Unless discharge instructions indicate otherwise, you may remove your bandages 24-48 hours after surgery, and you may shower at that time.  You may have steri-strips (small skin tapes) in place directly over the incision.  These strips should be left on the skin for 7-10 days.  If your surgeon used skin glue on the incision, you may shower in 24 hours.  The glue will flake off over the  next 2-3 weeks.  Any sutures or staples will be removed at the office during your follow-up visit. °8. ACTIVITIES:  You may resume regular daily activities (gradually increasing) beginning the next day.  Wearing a good support bra or sports bra minimizes pain and swelling.  You may have sexual intercourse when it is comfortable. °a. You may drive when you no longer are taking prescription pain medication, you can comfortably wear a seatbelt, and you can safely maneuver your car and apply brakes. °b. RETURN TO WORK:  ______________________________________________________________________________________ °9. You should see your doctor in the office for a follow-up appointment approximately two weeks after your surgery.  Your doctor’s nurse will typically make your follow-up appointment when she calls you with your pathology report.  Expect your pathology report 2-3 business days after your surgery.  You may call to check if you do not hear from us after three days. °10. OTHER INSTRUCTIONS: _______________________________________________________________________________________________ _____________________________________________________________________________________________________________________________________ °_____________________________________________________________________________________________________________________________________ °_____________________________________________________________________________________________________________________________________ ° °WHEN TO CALL YOUR DOCTOR: °1. Fever over 101.0 °2. Nausea and/or vomiting. °3. Extreme swelling or bruising. °4. Continued bleeding from incision. °5. Increased pain, redness, or drainage from the incision. ° °The clinic staff is available to answer your questions during regular business hours.  Please don’t hesitate to call and ask to speak to one of the nurses for clinical concerns.  If you have a medical emergency, go to the nearest  emergency room or call 911.  A surgeon from [facility] is always on call at the hospital. ° °For further questions, please visit [email or web address]  ° ° ° ° °  Post Anesthesia Home Care Instructions ° °Activity: °Get plenty of rest for the remainder of the day. A responsible individual must stay with you for 24 hours following the procedure.  °For the next 24 hours, DO NOT: °-Drive a car °-Operate machinery °-Drink alcoholic beverages °-Take any medication unless instructed by your physician °-Make any legal decisions or sign important papers. ° °Meals: °Start with liquid foods such as gelatin or soup. Progress to regular foods as tolerated. Avoid greasy, spicy, heavy foods. If nausea and/or vomiting occur, drink only clear liquids until the nausea and/or vomiting subsides. Call your physician if vomiting continues. ° °Special Instructions/Symptoms: °Your throat may feel dry or sore from the anesthesia or the breathing tube placed in your throat during surgery. If this causes discomfort, gargle with warm salt water. The discomfort should disappear within 24 hours. ° °If you had a scopolamine patch placed behind your ear for the management of post- operative nausea and/or vomiting: ° °1. The medication in the patch is effective for 72 hours, after which it should be removed.  Wrap patch in a tissue and discard in the trash. Wash hands thoroughly with soap and water. °2. You may remove the patch earlier than 72 hours if you experience unpleasant side effects which may include dry mouth, dizziness or visual disturbances. °3. Avoid touching the patch. Wash your hands with soap and water after contact with the patch. °  ° °

## 2017-10-21 NOTE — Transfer of Care (Signed)
Immediate Anesthesia Transfer of Care Note  Patient: Shelly [name]  Procedure(s) Performed: BREAST LUMPECTOMY WITH RADIOACTIVE SEED LOCALIZATION (Left Breast)  Patient Location: PACU  Anesthesia Type:General  Level of Consciousness: drowsy and patient cooperative  Airway & Oxygen Therapy: Patient Spontanous Breathing and Patient connected to face mask oxygen  Post-op Assessment: Report given to RN and Post -op Vital signs reviewed and stable  Post vital signs: Reviewed and stable  Last Vitals:  Vitals Value Taken Time  BP    Temp    Pulse 93 10/21/2017  8:20 AM  Resp    SpO2 100 % 10/21/2017  8:20 AM  Vitals shown include unvalidated device data.  Last Pain:  Vitals:   10/21/17 0640  TempSrc: Oral  PainSc: 0-No pain         Complications: No apparent anesthesia complications

## 2017-10-21 NOTE — Op Note (Signed)
Patient Name:           Shelly [name]   Date of Surgery:        10/21/2017  Pre op Diagnosis:     Invasive cancer left breast, upper outer quadrant, estrogen receptor positive  Post op Diagnosis:    Same  Procedure:                  Left breast lumpectomy with radioactive seed localization  Surgeon:                     Edsel Petrin. Dalbert Batman, M.D., FACS  Assistant:                     OR staff  Operative Indications:   This is a 77 year old female, referred by [name] at the [facility] for evaluation of invasive cancer left breast upper outer quadrant. Her PCP is Celene Squibb. She is seen in the [facility] by [name], [name], and me.  She is brought to the operating room electively for left breast lumpectomy      Six-month follow-up left mammogram revealed category B density, 5 mm mass left breast, 1 o'clock position, 5 cm from nipple. She has a lipoma at the 3 o'clock position of the left breast. Ultrasound of left axilla is negative. 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 ER 95%. PR 75%. Dr. [name] as this an invasive cancer but is very indolent and low metastatic potential. She is interested in breast conservation. I think she is a good candidate for that. Family history is negative for breast or ovarian cancer.       She is interested in breast conservation if it is appropriate, and she is a good candidate for that.  We do not believe that she would require or benefit from sentinel node biopsy. She will be scheduled for left breast lumpectomy with radioactive seed localization.  [name] plans radiation therapy, followed by antiestrogen therapy    Operative Findings:       The radioactive seed and the original titanium marker clip were in the upper outer quadrant of the left breast very close to each other.  Specimen mammogram looked very good with the radioactive seed and marker clip in the center of the specimen.   There was minimal mass-effect.  Procedure in Detail:          Following the induction of general LMA anesthesia the patient's left breast was prepped and draped in a sterile fashion.  Intravenous antibiotics were given.  Surgical timeout was performed.  0.5% Marcaine with epinephrine was used as local infiltration anesthetic.  Using the neoprobe I localized the radioactive signal in the upper outer quadrant.  I made a curvilinear incision in the skin crease lines.  The lumpectomy was performed using the neoprobe and electrocautery.  The specimen was removed and marked with silk sutures at a 6 color ink kit.  The specimen mammogram looked good as described above.  Specimen was sent to the lab where the seed was retrieved.  Hemostasis was excellent.  The wound was irrigated.  5 metal marker clips were placed in the walls of the lumpectomy cavity.  The breast tissues were closed in layers with interrupted 3-0 Vicryl and the skin closed with a running subcuticular 4-0 Monocryl and Dermabond.  Dry bandages and a breast binder were placed.  The patient tolerated the  procedure well was taken to PACU in stable condition.  EBL 10 cc.  Counts correct.  Complications none.  Addendum: I logged onto the [employer] and reviewed her prescription medication history     Deniro Laymon M. Dalbert Batman, M.D., FACS General and Minimally Invasive Surgery Breast and Colorectal Surgery  10/21/2017 8:17 AM

## 2017-10-21 NOTE — Anesthesia Postprocedure Evaluation (Signed)
Anesthesia Post Note  Patient: Shelly [name]  Procedure(s) Performed: BREAST LUMPECTOMY WITH RADIOACTIVE SEED LOCALIZATION (Left Breast)     Patient location during evaluation: PACU Anesthesia Type: General Level of consciousness: awake and alert Pain management: pain level controlled Vital Signs Assessment: post-procedure vital signs reviewed and stable Respiratory status: spontaneous breathing, nonlabored ventilation and respiratory function stable Cardiovascular status: blood pressure returned to baseline and stable Postop Assessment: no apparent nausea or vomiting Anesthetic complications: no    Last Vitals:  Vitals:   10/21/17 0845 10/21/17 0917  BP:  135/72  Pulse: 92 90  Resp: 16 18  Temp:  36.4 C  SpO2: 95% 98%    Last Pain:  Vitals:   10/21/17 0917  TempSrc:   PainSc: 0-No pain                 Lynda Rainwater

## 2017-10-22 ENCOUNTER — Encounter (HOSPITAL_BASED_OUTPATIENT_CLINIC_OR_DEPARTMENT_OTHER): Payer: Self-pay | Admitting: General Surgery

## 2017-10-23 NOTE — Progress Notes (Signed)
Inform patient of Pathology report,. Breast pathology shows cancer wasonly 0.8 cm. Diameter. Margins negative. No further surgery required. Let me know that you reached her. Thanks.  HMI

## 2017-10-28 DIAGNOSIS — M8940 Other hypertrophic osteoarthropathy, unspecified site: Secondary | ICD-10-CM | POA: Diagnosis not present

## 2017-10-28 DIAGNOSIS — E559 Vitamin D deficiency, unspecified: Secondary | ICD-10-CM | POA: Diagnosis not present

## 2017-10-28 DIAGNOSIS — M1990 Unspecified osteoarthritis, unspecified site: Secondary | ICD-10-CM | POA: Diagnosis not present

## 2017-10-28 DIAGNOSIS — E1169 Type 2 diabetes mellitus with other specified complication: Secondary | ICD-10-CM | POA: Diagnosis not present

## 2017-10-28 DIAGNOSIS — E782 Mixed hyperlipidemia: Secondary | ICD-10-CM | POA: Diagnosis not present

## 2017-10-28 DIAGNOSIS — R739 Hyperglycemia, unspecified: Secondary | ICD-10-CM | POA: Diagnosis not present

## 2017-10-28 DIAGNOSIS — I10 Essential (primary) hypertension: Secondary | ICD-10-CM | POA: Diagnosis not present

## 2017-10-28 DIAGNOSIS — M159 Polyosteoarthritis, unspecified: Secondary | ICD-10-CM | POA: Diagnosis not present

## 2017-10-28 DIAGNOSIS — E875 Hyperkalemia: Secondary | ICD-10-CM | POA: Diagnosis not present

## 2017-10-28 NOTE — Assessment & Plan Note (Signed)
08/27/2017: Screening detected left breast mass UOQ at 1:00: 0.4 cm on 05/28/2017, follow-up on 08/13/2017 it was 0.5 cm biopsy revealed encapsulated papillary cancer ER 95%, PR 75%, HER-2 negative ratio 1.27 copy #1.9 T1 a N0 stage Ia  10/21/17: Left Lumpectomy: Encapsulated papillary carcinoma 0.8 cm, grade 2, Margins Neg, ER 95%, PR 75%, HER-2 negative ratio 1.27 copy #1.9 T1 a N0 stage Ia  Plan: Adj antiestrogen therapy with letrozole 2.5 mg daily x5 years. After discussing pros and cons it was felt that the patient would not benefit from adjuvant radiation.  RTC in 3 months for SCP visit

## 2017-10-29 ENCOUNTER — Telehealth: Payer: Self-pay | Admitting: Adult Health

## 2017-10-29 ENCOUNTER — Inpatient Hospital Stay: Payer: PPO | Attending: Hematology and Oncology | Admitting: Hematology and Oncology

## 2017-10-29 DIAGNOSIS — C50412 Malignant neoplasm of upper-outer quadrant of left female breast: Secondary | ICD-10-CM | POA: Diagnosis not present

## 2017-10-29 DIAGNOSIS — Z79810 Long term (current) use of selective estrogen receptor modulators (SERMs): Secondary | ICD-10-CM

## 2017-10-29 DIAGNOSIS — Z170 Estrogen receptor positive status [ER+]: Secondary | ICD-10-CM | POA: Insufficient documentation

## 2017-10-29 MED ORDER — TAMOXIFEN CITRATE 20 MG PO TABS: 20.0000 mg | ORAL_TABLET | Freq: Every day | ORAL | 3 refills | Status: DC

## 2017-10-29 NOTE — Telephone Encounter (Signed)
Gave avs and calendar ° °

## 2017-10-29 NOTE — Progress Notes (Signed)
Patient Care Team: Celene Squibb, MD as PCP - General (Internal Medicine) Rothbart, Cristopher Estimable, MD (Cardiology) Garald Balding, MD (Orthopedic Surgery) Frederik Pear, MD (Orthopedic Surgery) Derek Jack, MD as Consulting Physician (Hematology) Fanny Skates, MD as Consulting Physician (General Surgery) Nicholas Lose, MD as Consulting Physician (Hematology and Oncology) Kyung Rudd, MD as Consulting Physician (Radiation Oncology)  DIAGNOSIS:  Encounter Diagnosis  Name Primary?  . Malignant neoplasm of upper-outer quadrant of left breast in female, estrogen receptor positive ([place])     SUMMARY OF ONCOLOGIC HISTORY:   Malignant neoplasm of upper-outer quadrant of left breast in female, estrogen receptor positive ([place])   08/27/2017 Initial Diagnosis    Screening detected left breast mass UOQ at 1:00: 0.4 cm on 05/28/2017, follow-up on 08/13/2017 it was 0.5 cm biopsy revealed encapsulated papillary cancer ER 95%, PR 75%, HER-2 negative ratio 1.27 copy #1.9 T1 a N0 stage Ia    10/21/2017 Surgery    10/21/17: Left Lumpectomy: Encapsulated papillary carcinoma 0.8 cm, grade 2, Margins Neg, ER 95%, PR 75%, HER-2 negative ratio 1.27 copy #1.9 T1 a N0 stage Ia     CHIEF COMPLIANT: Follow-up after recent left lumpectomy  INTERVAL HISTORY: Shelly [name] is a 77 year old with above-mentioned history of left breast cancer with DCIS who underwent lumpectomy and is here today for pathology report.  She is healing and recovering very well from recent surgery.  Denies any major pain or discomfort.  REVIEW OF SYSTEMS:   Constitutional: Denies fevers, chills or abnormal weight loss Eyes: Denies blurriness of vision Ears, nose, mouth, throat, and face: Denies mucositis or sore throat Respiratory: Denies cough, dyspnea or wheezes Cardiovascular: Denies palpitation, chest discomfort Gastrointestinal:  Denies nausea, heartburn or change in bowel habits Skin: Denies abnormal skin  rashes Lymphatics: Denies new lymphadenopathy or easy bruising Neurological:Denies numbness, tingling or new weaknesses Behavioral/Psych: Mood is stable, no new changes  Extremities: No lower extremity edema Breast: Recent left lumpectomy All other systems were reviewed with the patient and are negative.  I have reviewed the past medical history, past surgical history, social history and family history with the patient and they are unchanged from previous note.  ALLERGIES:  is allergic to celebrex [celecoxib]; claritin [loratadine]; penicillins; and percocet [oxycodone-acetaminophen].  MEDICATIONS:  Current Outpatient Medications  Medication Sig Dispense Refill  . acetaminophen (TYLENOL) 325 MG tablet Take 650 mg by mouth every 6 (six) hours as needed.    Marland Kitchen albuterol (PROAIR HFA) 108 (90 BASE) MCG/ACT inhaler Inhale 2 puffs into the lungs every 6 (six) hours as needed for shortness of breath.     Marland Kitchen amLODipine (NORVASC) 10 MG tablet Take 10 mg by mouth daily.      . benazepril (LOTENSIN) 40 MG tablet Take 40 mg by mouth daily.      . carvedilol (COREG) 6.25 MG tablet Take 6.25 mg by mouth 2 (two) times daily.      . cetirizine (ZYRTEC) 10 MG tablet Take 10 mg by mouth daily.     . Cholecalciferol (VITAMIN D PO) Take 1,000 Units by mouth.    . flintstones complete (FLINTSTONES) 60 MG chewable tablet Chew 1 tablet by mouth daily.    Marland Kitchen glipiZIDE (GLUCOTROL XL) 5 MG 24 hr tablet Take 5 mg by mouth every morning.     . metFORMIN (GLUCOPHAGE) 1000 MG tablet Take 1,000 mg by mouth 2 (two) times daily with a meal.    . Multiple Vitamins-Minerals (PRESERVISION AREDS PO) Take 1 tablet by mouth  2 (two) times daily.     . pravastatin (PRAVACHOL) 10 MG tablet Take 10 mg by mouth daily.    . tamoxifen (NOLVADEX) 20 MG tablet Take 1 tablet (20 mg total) by mouth daily. 90 tablet 3  . traMADol (ULTRAM) 50 MG tablet Take 50 mg by mouth every 6 (six) hours as needed.    . TRICOR 145 MG tablet Take 145 mg  by mouth daily.     . Turmeric 450 MG CAPS Take 500 mg by mouth.      No current facility-administered medications for this visit.     PHYSICAL EXAMINATION: ECOG PERFORMANCE STATUS: 1 - Symptomatic but completely ambulatory  Vitals:   10/29/17 1457  BP: (!) 144/63  Pulse: 72  Resp: 18  Temp: 98.4 F (36.9 C)  SpO2: 95%   Filed Weights   10/29/17 1457  Weight: 238 lb 3.2 oz (108 kg)    GENERAL:alert, no distress and comfortable SKIN: skin color, texture, turgor are normal, no rashes or significant lesions EYES: normal, Conjunctiva are pink and non-injected, sclera clear OROPHARYNX:no exudate, no erythema and lips, buccal mucosa, and tongue normal  NECK: supple, thyroid normal size, non-tender, without nodularity LYMPH:  no palpable lymphadenopathy in the cervical, axillary or inguinal LUNGS: clear to auscultation and percussion with normal breathing effort HEART: regular rate & rhythm and no murmurs and no lower extremity edema ABDOMEN:abdomen soft, non-tender and normal bowel sounds MUSCULOSKELETAL:no cyanosis of digits and no clubbing  NEURO: alert & oriented x 3 with fluent speech, no focal motor/sensory deficits EXTREMITIES: No lower extremity edema   LABORATORY DATA:  I have reviewed the data as listed CMP Latest Ref Rng & Units 09/25/2017 07/03/2012 07/02/2012  Glucose 70 - 99 mg/dL 128(H) 181(H) 172(H)  BUN 8 - 23 mg/dL _0 Creatinine 0.44 - 1.00 mg/dL 0.86 1.06 0.73  Sodium 135 - 145 mmol/L 142 133(L) 136  Potassium 3.5 - 5.1 mmol/L 4.4 4.4 3.9  Chloride 98 - 111 mmol/L 103 95(L) 99  CO2 22 - 32 mmol/L _1 Calcium 8.9 - 10.3 mg/dL 9.0 8.9 8.3(L)  Total Protein 6.5 - 8.1 g/dL 6.6 - -  Total Bilirubin 0.3 - 1.2 mg/dL 0.4 - -  Alkaline Phos 38 - 126 U/L 64 - -  AST 15 - 41 U/L 15 - -  ALT 0 - 44 U/L 14 - -    Lab Results  Component Value Date   WBC 5.5 09/25/2017   HGB 13.0 09/25/2017   HCT 40.7 09/25/2017   MCV 83.2 09/25/2017   PLT 285  09/25/2017   NEUTROABS 3.3 09/25/2017    ASSESSMENT & PLAN:  Malignant neoplasm of upper-outer quadrant of left breast in female, estrogen receptor positive ([place]) 08/27/2017: Screening detected left breast mass UOQ at 1:00: 0.4 cm on 05/28/2017, follow-up on 08/13/2017 it was 0.5 cm biopsy revealed encapsulated papillary cancer ER 95%, PR 75%, HER-2 negative ratio 1.27 copy #1.9 T1 a N0 stage Ia  10/21/17: Left Lumpectomy: Encapsulated papillary carcinoma 0.8 cm, grade 2, Margins Neg, ER 95%, PR 75%, HER-2 negative ratio 1.27 copy #1.9 T1 a N0 stage Ia  Plan: Adj antiestrogen therapy with Tamoxifen 20 mg daily x5 years. After discussing pros and cons it was felt that the patient would not benefit from adjuvant radiation. Tamoxifen counseling:We discussed the risks and benefits of tamoxifen. These include but not limited to insomnia, hot flashes, mood changes, vaginal dryness, and weight gain. Although rare, serious side  effects including endometrial cancer, risk of blood clots were also discussed. We strongly believe that the benefits far outweigh the risks. Patient understands these risks and consented to starting treatment. Planned treatment duration is 5 years.  RTC in 3 months for SCP visit   No orders of the defined types were placed in this encounter.  The patient has a good understanding of the overall plan. she agrees with it. she will call with any problems that may develop before the next visit here.   Harriette Ohara, MD 10/29/17

## 2017-10-30 ENCOUNTER — Encounter: Payer: Self-pay | Admitting: *Deleted

## 2017-11-05 ENCOUNTER — Ambulatory Visit (HOSPITAL_COMMUNITY): Payer: PPO | Admitting: Hematology

## 2017-11-18 ENCOUNTER — Ambulatory Visit (INDEPENDENT_AMBULATORY_CARE_PROVIDER_SITE_OTHER): Payer: PPO | Admitting: Orthopaedic Surgery

## 2017-11-18 ENCOUNTER — Encounter (INDEPENDENT_AMBULATORY_CARE_PROVIDER_SITE_OTHER): Payer: Self-pay | Admitting: Orthopaedic Surgery

## 2017-11-18 ENCOUNTER — Ambulatory Visit (INDEPENDENT_AMBULATORY_CARE_PROVIDER_SITE_OTHER): Payer: PPO

## 2017-11-18 VITALS — BP 140/65 | HR 81 | Ht 66.0 in | Wt 238.0 lb

## 2017-11-18 DIAGNOSIS — M25552 Pain in left hip: Secondary | ICD-10-CM | POA: Diagnosis not present

## 2017-11-18 DIAGNOSIS — M25512 Pain in left shoulder: Secondary | ICD-10-CM

## 2017-11-18 DIAGNOSIS — G8929 Other chronic pain: Secondary | ICD-10-CM | POA: Diagnosis not present

## 2017-11-18 NOTE — Progress Notes (Signed)
Office Visit Note   Patient: Shelly [name]           Date of Birth: September 26, 1940           MRN: [id number] Visit Date: 11/18/2017              Requested by: Celene Squibb, MD [place], [place] [address] PCP: Celene Squibb, MD   Assessment & Plan: Visit Diagnoses:  1. Chronic left shoulder pain   2. Pain of left hip joint     Plan: Osteoarthritis left shoulder.  Long discussion regarding diagnosis and treatment options including shoulder replacement.  Shelly [name] needs to think about the above before we consider any further diagnostic studies.  Also having some left buttock pain after having fallen several times.  Films are negative the pelvis except for some mild arthritis of both of her hips that appears to be asymptomatic.  I think a lot of that discomfort in her buttock is radiating from her back long discussion regarding that as well total time in the office over 30 minutes. As far as the left shoulder is concerned I would consider thin slice CT scan if she like to proceed with a shoulder replacement.  I think that is going to be the definitive treatment for her shoulder.  In terms of her buttock pain I think it be worthwhile to repeat the CT scan of her lumbar spine as she is claustrophobic  Follow-Up Instructions: No follow-ups on file.   Orders:  Orders Placed This Encounter  Procedures  . XR Pelvis 1-2 Views   No orders of the defined types were placed in this encounter.     Procedures: No procedures performed   Clinical Data: No additional findings.   Subjective: Chief Complaint  Patient presents with  . Follow-up    STANDING ON MAT AND WENT TO SIT DOWN AND MAT SLID OUT FROM UNDER PT AND SHE FELL HARD ON LEFT SIDE  Shelly [name] is accompanied by her daughter and here for evaluation of several problems.  I had seen her in March of this year for evaluation of left shoulder pain.  Films demonstrate significant osteoarthritis of the shoulder with large  inferior humeral head spurring and some narrowing of the joint space.  I injected with cortisone and she had some temporary relief of she does have achiness and soreness and some loss of overhead motion.  She is left hand nondominant numbness or tingling.  In addition she is been having some left buttock soreness.  She is fallen more than once directly on the issue.  No bruising ecchymosis.  No numbness or tingling.  No related back pain no groin discomfort.  Had MRI scan of lumbar spine in 2013 with grade 1 slip of L4-5 and mild spinal stenosis.  There was also a small right-sided disc protrusion at L5-S1  HPI  Review of Systems  Constitutional: Negative for fatigue and fever.  HENT: Negative for ear pain.   Eyes: Negative for pain.  Respiratory: Negative for cough and shortness of breath.   Cardiovascular: Negative for leg swelling.  Gastrointestinal: Negative for constipation and diarrhea.  Genitourinary: Negative for difficulty urinating.  Musculoskeletal: Negative for back pain and neck pain.  Skin: Negative for rash.  Allergic/Immunologic: Negative for food allergies.  Neurological: Positive for weakness. Negative for numbness.  Hematological: Does not bruise/bleed easily.  Psychiatric/Behavioral: Positive for sleep disturbance.     Objective: Vital Signs: BP 140/65 (BP  Location: Right Arm, Patient Position: Sitting, Cuff Size: Normal)   Pulse 81   Ht 5\' 6"  (1.676 m)   Wt 238 lb (108 kg)   BMI 38.41 kg/m   Physical Exam  Constitutional: She is oriented to person, place, and time. She appears well-developed and well-nourished.  HENT:  Mouth/Throat: Oropharynx is clear and moist.  Eyes: Pupils are equal, round, and reactive to light. EOM are normal.  Pulmonary/Chest: Effort normal.  Neurological: She is alert and oriented to person, place, and time.  Skin: Skin is warm and dry.  Psychiatric: She has a normal mood and affect. Her behavior is normal.    Ortho Exam awake  alert and oriented x3.  Comfortable sitting.  Lacks about 35 to 4 degrees of full overhead motion.  About 80 degrees of abduction left shoulder.  Some loss of external rotation with mild grating consistent with her arthritis.  Skin intact.  Neurovascular exam intact.  Good grip and good release.  No percussible tenderness of the lumbar spine.  Some soreness in her left buttock but no masses.  Painless range of motion of the left hip.  Good pulses both feet Specialty Comments:  No specialty comments available.  Imaging: No results found.   PMFS History: Patient Active Problem List   Diagnosis Date Noted  . Malignant neoplasm of upper-outer quadrant of left breast in female, estrogen receptor positive ([facility]) 09/18/2017  . Osteoarthritis of left knee 07/03/2012  . Hx of diagnostic tests 05/16/2012  . Hyperlipidemia   . Asthma   . Diabetes mellitus ([facility])   . PUD (peptic ulcer disease)   . Degenerative joint disease   . Cholelithiasis   . MACULAR DEGENERATION 12/07/2008  . HYPERTENSION 12/07/2008   Past Medical History:  Diagnosis Date  . Arthritis    "qwhere" (07/01/2012)  . Asthma    normal chest x-ray in 2010  . Basal cell carcinoma of face   . Cholelithiasis   . Chronic lower back pain   . Degenerative joint disease    Right TKA-2010; low back pain; hands and hips also affected  . Exertional shortness of breath    "mostly from being too fat" (07/01/2012)  . GERD (gastroesophageal reflux disease)    history  . H/O hiatal hernia   . Heart murmur    small  . Hemorrhoid   . HTN (hypertension)   . Hyperlipidemia   . Macular degeneration    "both eyes" (07/01/2012)  . PUD (peptic ulcer disease) 2003   Upper GI bleed-gastric ulcer; gastroesophageal reflux disease  . Seasonal allergies   . Type II diabetes mellitus ([facility])     Family History  Problem Relation Age of Onset  . Hodgkin's lymphoma Mother   . Cervical cancer Sister        half sister mom's side  . Lung cancer  Sister     Past Surgical History:  Procedure Laterality Date  . APPENDECTOMY    . BREAST LUMPECTOMY WITH RADIOACTIVE SEED LOCALIZATION Left 10/21/2017   Procedure: BREAST LUMPECTOMY WITH RADIOACTIVE SEED LOCALIZATION;  Surgeon: Fanny Skates, MD;  Location: [facility];  Service: General;  Laterality: Left;  . CATARACT EXTRACTION BILATERAL W/ ANTERIOR VITRECTOMY Bilateral   . EYE SURGERY Bilateral    "laser in eyes to stop bleeding and injections for macular degeneration" (07/01/2012)  . FOOT SURGERY Bilateral    straighten 1st toe with a wedge.  Marland Kitchen KNEE ARTHROSCOPY Bilateral   . SKIN CANCER EXCISION  2013   "  face" (07/01/2012)  . TOTAL KNEE ARTHROPLASTY Right 2011  . TOTAL KNEE ARTHROPLASTY Left 07/01/2012  . TOTAL KNEE ARTHROPLASTY Left 07/01/2012   Procedure: LEFT TOTAL KNEE ARTHROPLASTY;  Surgeon: Garald Balding, MD;  Location: [facility];  Service: Orthopedics;  Laterality: Left;  Left Total Knee Arthroplasty   Social History   Occupational History  . Not on file  Tobacco Use  . Smoking status: Former Smoker    Packs/day: 2.50    Years: 27.00    Pack years: 67.50    Types: Cigarettes    Last attempt to quit: 02/12/1981    Years since quitting: 36.7  . Smokeless tobacco: Never Used  Substance and Sexual Activity  . Alcohol use: No  . Drug use: No  . Sexual activity: Not Currently

## 2017-11-27 DIAGNOSIS — E782 Mixed hyperlipidemia: Secondary | ICD-10-CM | POA: Diagnosis not present

## 2017-11-27 DIAGNOSIS — R739 Hyperglycemia, unspecified: Secondary | ICD-10-CM | POA: Diagnosis not present

## 2017-11-27 DIAGNOSIS — D509 Iron deficiency anemia, unspecified: Secondary | ICD-10-CM | POA: Diagnosis not present

## 2017-11-27 DIAGNOSIS — I10 Essential (primary) hypertension: Secondary | ICD-10-CM | POA: Diagnosis not present

## 2017-11-27 DIAGNOSIS — E559 Vitamin D deficiency, unspecified: Secondary | ICD-10-CM | POA: Diagnosis not present

## 2017-11-27 DIAGNOSIS — E1169 Type 2 diabetes mellitus with other specified complication: Secondary | ICD-10-CM | POA: Diagnosis not present

## 2017-12-04 DIAGNOSIS — E875 Hyperkalemia: Secondary | ICD-10-CM | POA: Diagnosis not present

## 2017-12-04 DIAGNOSIS — M19049 Primary osteoarthritis, unspecified hand: Secondary | ICD-10-CM | POA: Diagnosis not present

## 2017-12-04 DIAGNOSIS — E1169 Type 2 diabetes mellitus with other specified complication: Secondary | ICD-10-CM | POA: Diagnosis not present

## 2017-12-04 DIAGNOSIS — I10 Essential (primary) hypertension: Secondary | ICD-10-CM | POA: Diagnosis not present

## 2017-12-04 DIAGNOSIS — C50919 Malignant neoplasm of unspecified site of unspecified female breast: Secondary | ICD-10-CM | POA: Diagnosis not present

## 2017-12-04 DIAGNOSIS — E782 Mixed hyperlipidemia: Secondary | ICD-10-CM | POA: Diagnosis not present

## 2017-12-04 DIAGNOSIS — D649 Anemia, unspecified: Secondary | ICD-10-CM | POA: Diagnosis not present

## 2017-12-04 DIAGNOSIS — E559 Vitamin D deficiency, unspecified: Secondary | ICD-10-CM | POA: Diagnosis not present

## 2017-12-04 DIAGNOSIS — Z0000 Encounter for general adult medical examination without abnormal findings: Secondary | ICD-10-CM | POA: Diagnosis not present

## 2017-12-04 DIAGNOSIS — Z23 Encounter for immunization: Secondary | ICD-10-CM | POA: Diagnosis not present

## 2017-12-04 DIAGNOSIS — H3530 Unspecified macular degeneration: Secondary | ICD-10-CM | POA: Diagnosis not present

## 2017-12-13 ENCOUNTER — Other Ambulatory Visit: Payer: Self-pay

## 2017-12-13 DIAGNOSIS — I10 Essential (primary) hypertension: Secondary | ICD-10-CM | POA: Diagnosis not present

## 2017-12-13 DIAGNOSIS — E782 Mixed hyperlipidemia: Secondary | ICD-10-CM | POA: Diagnosis not present

## 2017-12-13 DIAGNOSIS — E1169 Type 2 diabetes mellitus with other specified complication: Secondary | ICD-10-CM | POA: Diagnosis not present

## 2017-12-13 MED ORDER — TAMOXIFEN CITRATE 20 MG PO TABS: 20.0000 mg | ORAL_TABLET | Freq: Every day | ORAL | 3 refills | Status: DC

## 2017-12-27 DIAGNOSIS — R221 Localized swelling, mass and lump, neck: Secondary | ICD-10-CM | POA: Diagnosis not present

## 2018-01-13 DIAGNOSIS — I10 Essential (primary) hypertension: Secondary | ICD-10-CM | POA: Diagnosis not present

## 2018-01-13 DIAGNOSIS — E119 Type 2 diabetes mellitus without complications: Secondary | ICD-10-CM | POA: Diagnosis not present

## 2018-01-13 DIAGNOSIS — E782 Mixed hyperlipidemia: Secondary | ICD-10-CM | POA: Diagnosis not present

## 2018-01-13 DIAGNOSIS — R599 Enlarged lymph nodes, unspecified: Secondary | ICD-10-CM | POA: Diagnosis not present

## 2018-01-22 ENCOUNTER — Telehealth (INDEPENDENT_AMBULATORY_CARE_PROVIDER_SITE_OTHER): Payer: Self-pay | Admitting: Orthopaedic Surgery

## 2018-01-22 NOTE — Telephone Encounter (Signed)
Dr. Wende Neighbors requesting for our office to refer patient to Physical Therapy for lt shoulder, and lt hip pain. Please call to advise.

## 2018-01-23 ENCOUNTER — Other Ambulatory Visit: Payer: Self-pay | Admitting: *Deleted

## 2018-01-23 DIAGNOSIS — M25512 Pain in left shoulder: Secondary | ICD-10-CM

## 2018-01-23 DIAGNOSIS — M25552 Pain in left hip: Secondary | ICD-10-CM

## 2018-01-23 DIAGNOSIS — G8929 Other chronic pain: Secondary | ICD-10-CM

## 2018-01-23 NOTE — Telephone Encounter (Signed)
If Shelly [name] is not interested in a shoulder replacement then PT is reasonable

## 2018-01-23 NOTE — Telephone Encounter (Signed)
I called patient, PT order place [facility]

## 2018-01-23 NOTE — Telephone Encounter (Signed)
Office notes states next step to order CT for Left shoulder and lumbar, nothing in notes regarding PT. Please advise.

## 2018-02-06 ENCOUNTER — Other Ambulatory Visit: Payer: Self-pay | Admitting: *Deleted

## 2018-02-06 DIAGNOSIS — Z7689 Persons encountering health services in other specified circumstances: Secondary | ICD-10-CM

## 2018-02-07 ENCOUNTER — Telehealth: Payer: Self-pay | Admitting: Adult Health

## 2018-02-07 ENCOUNTER — Inpatient Hospital Stay: Payer: PPO | Attending: Hematology and Oncology | Admitting: Adult Health

## 2018-02-07 ENCOUNTER — Encounter: Payer: Self-pay | Admitting: Adult Health

## 2018-02-07 VITALS — BP 137/66 | HR 83 | Temp 98.2°F | Resp 14 | Ht 66.0 in | Wt 231.9 lb

## 2018-02-07 DIAGNOSIS — Z87891 Personal history of nicotine dependence: Secondary | ICD-10-CM | POA: Insufficient documentation

## 2018-02-07 DIAGNOSIS — Z170 Estrogen receptor positive status [ER+]: Secondary | ICD-10-CM | POA: Diagnosis not present

## 2018-02-07 DIAGNOSIS — C50412 Malignant neoplasm of upper-outer quadrant of left female breast: Secondary | ICD-10-CM | POA: Diagnosis not present

## 2018-02-07 DIAGNOSIS — E119 Type 2 diabetes mellitus without complications: Secondary | ICD-10-CM | POA: Diagnosis not present

## 2018-02-07 DIAGNOSIS — Z7984 Long term (current) use of oral hypoglycemic drugs: Secondary | ICD-10-CM | POA: Insufficient documentation

## 2018-02-07 DIAGNOSIS — Z79810 Long term (current) use of selective estrogen receptor modulators (SERMs): Secondary | ICD-10-CM | POA: Diagnosis not present

## 2018-02-07 DIAGNOSIS — I10 Essential (primary) hypertension: Secondary | ICD-10-CM | POA: Diagnosis not present

## 2018-02-07 DIAGNOSIS — E2839 Other primary ovarian failure: Secondary | ICD-10-CM

## 2018-02-07 DIAGNOSIS — M19012 Primary osteoarthritis, left shoulder: Secondary | ICD-10-CM | POA: Diagnosis not present

## 2018-02-07 DIAGNOSIS — Z79899 Other long term (current) drug therapy: Secondary | ICD-10-CM | POA: Insufficient documentation

## 2018-02-07 NOTE — Telephone Encounter (Signed)
Scheduled appt per 12/27 los - gave patient AVS And calender per los

## 2018-02-07 NOTE — Progress Notes (Signed)
CLINIC:  Survivorship   REASON FOR VISIT:  Routine follow-up post-treatment for a recent history of breast cancer.  BRIEF ONCOLOGIC HISTORY:    Malignant neoplasm of upper-outer quadrant of left breast in female, estrogen receptor positive ([facility])   08/27/2017 Initial Diagnosis    Screening detected left breast mass UOQ at 1:00: 0.4 cm on 05/28/2017, follow-up on 08/13/2017 it was 0.5 cm biopsy revealed encapsulated papillary cancer ER 95%, PR 75%, HER-2 negative ratio 1.27 copy #1.9 T1 a N0 stage Ia    10/21/2017 Surgery    10/21/17: Left Lumpectomy: Encapsulated papillary carcinoma 0.8 cm, grade 2, Margins Neg, ER 95%, PR 75%, HER-2 negative ratio 1.27 copy #1.9 T1 a N0 stage Ia    11/2017 -  Anti-estrogen oral therapy    Letrozole daily     INTERVAL HISTORY:  Ms. Sipos presents to the [facility] today for our initial meeting to review her survivorship care plan detailing her treatment course for breast cancer, as well as monitoring long-term side effects of that treatment, education regarding health maintenance, screening, and overall wellness and health promotion.     Overall, Ms. Dufner is feeling well.  She is taking Tamoxifen daily and is tolerating it moderately well.  She has had some mild hot flashes.  She notes some occasional moodiness and is crying easier than usual.  The hot flashes and sensitivity is not particularly bothersome for her.      REVIEW OF SYSTEMS:  Review of Systems  Constitutional: Negative for appetite change, chills, fatigue, fever and unexpected weight change.  HENT:   Negative for hearing loss and lump/mass.   Eyes: Negative for eye problems and icterus.  Respiratory: Negative for chest tightness, cough and shortness of breath.   Cardiovascular: Negative for chest pain, leg swelling and palpitations.  Gastrointestinal: Negative for abdominal distention, abdominal pain, constipation, diarrhea, nausea and vomiting.  Endocrine: Negative for hot  flashes.  Musculoskeletal: Positive for arthralgias (left shoulder pain/arthritis).  Skin: Negative for itching and rash.  Neurological: Negative for dizziness, extremity weakness, headaches and seizures.       Has had three falls that started prior to diagnosis, and most recently a few weeks ago.  She has reviewed with PCP and is going to undergo PT for this.  Hematological: Negative for adenopathy. Does not bruise/bleed easily.  Psychiatric/Behavioral: Negative for depression. The patient is not nervous/anxious.    Breast: Denies any new nodularity, masses, tenderness, nipple changes, or nipple discharge.      ONCOLOGY TREATMENT TEAM:  1. Surgeon:  Dr. Dalbert Batman at [facility] 2. Medical Oncologist: [name]      PAST MEDICAL/SURGICAL HISTORY:  Past Medical History:  Diagnosis Date  . Arthritis    "qwhere" (07/01/2012)  . Asthma    normal chest x-ray in 2010  . Basal cell carcinoma of face   . Cholelithiasis   . Chronic lower back pain   . Degenerative joint disease    Right TKA-2010; low back pain; hands and hips also affected  . Exertional shortness of breath    "mostly from being too fat" (07/01/2012)  . GERD (gastroesophageal reflux disease)    history  . H/O hiatal hernia   . Heart murmur    small  . Hemorrhoid   . HTN (hypertension)   . Hyperlipidemia   . Macular degeneration    "both eyes" (07/01/2012)  . PUD (peptic ulcer disease) 2003   Upper GI bleed-gastric ulcer; gastroesophageal reflux disease  . Seasonal  allergies   . Type II diabetes mellitus ([facility])    Past Surgical History:  Procedure Laterality Date  . APPENDECTOMY    . BREAST LUMPECTOMY WITH RADIOACTIVE SEED LOCALIZATION Left 10/21/2017   Procedure: BREAST LUMPECTOMY WITH RADIOACTIVE SEED LOCALIZATION;  Surgeon: Fanny Skates, MD;  Location: [facility];  Service: General;  Laterality: Left;  . CATARACT EXTRACTION BILATERAL W/ ANTERIOR VITRECTOMY Bilateral   . EYE SURGERY  Bilateral    "laser in eyes to stop bleeding and injections for macular degeneration" (07/01/2012)  . FOOT SURGERY Bilateral    straighten 1st toe with a wedge.  Marland Kitchen KNEE ARTHROSCOPY Bilateral   . SKIN CANCER EXCISION  2013   "face" (07/01/2012)  . TOTAL KNEE ARTHROPLASTY Right 2011  . TOTAL KNEE ARTHROPLASTY Left 07/01/2012  . TOTAL KNEE ARTHROPLASTY Left 07/01/2012   Procedure: LEFT TOTAL KNEE ARTHROPLASTY;  Surgeon: Garald Balding, MD;  Location: [facility];  Service: Orthopedics;  Laterality: Left;  Left Total Knee Arthroplasty     ALLERGIES:  Allergies  Allergen Reactions  . Celebrex [Celecoxib] Other (See Comments)    Abdominal pain; history of upper GI bleed   . Claritin [Loratadine] Hives and Swelling    blisters  . Penicillins Hives  . Percocet [Oxycodone-Acetaminophen] Other (See Comments)    Severe constipation     CURRENT MEDICATIONS:  Outpatient Encounter Medications as of 02/07/2018  Medication Sig  . acetaminophen (TYLENOL) 325 MG tablet Take 650 mg by mouth every 6 (six) hours as needed.  Marland Kitchen albuterol (PROAIR HFA) 108 (90 BASE) MCG/ACT inhaler Inhale 2 puffs into the lungs every 6 (six) hours as needed for shortness of breath.   Marland Kitchen amLODipine (NORVASC) 10 MG tablet Take 10 mg by mouth daily.    . benazepril (LOTENSIN) 40 MG tablet Take 40 mg by mouth daily.    . carvedilol (COREG) 3.125 MG tablet Take 3.125 mg by mouth 2 (two) times daily. Dose reduction per patient  . cetirizine (ZYRTEC) 10 MG tablet Take 10 mg by mouth daily.   . Cholecalciferol (VITAMIN D PO) Take 1,000 Units by mouth.  . flintstones complete (FLINTSTONES) 60 MG chewable tablet Chew 1 tablet by mouth daily.  Marland Kitchen glipiZIDE (GLUCOTROL XL) 5 MG 24 hr tablet Take 5 mg by mouth every morning.   . metFORMIN (GLUCOPHAGE) 1000 MG tablet Take 1,000 mg by mouth 2 (two) times daily with a meal.  . Multiple Vitamins-Minerals (PRESERVISION AREDS PO) Take 1 tablet by mouth 2 (two) times daily.   . pravastatin  (PRAVACHOL) 10 MG tablet Take 10 mg by mouth daily.  . tamoxifen (NOLVADEX) 20 MG tablet Take 1 tablet (20 mg total) by mouth daily.  . traMADol (ULTRAM) 50 MG tablet Take 50 mg by mouth every 6 (six) hours as needed.  . TRICOR 145 MG tablet Take 145 mg by mouth daily.   . Turmeric 450 MG CAPS Take 500 mg by mouth.    No facility-administered encounter medications on file as of 02/07/2018.      ONCOLOGIC FAMILY HISTORY:  Family History  Problem Relation Age of Onset  . Hodgkin's lymphoma Mother   . Cervical cancer Sister        half sister mom's side  . Lung cancer Sister      GENETIC COUNSELING/TESTING: Not at this time  SOCIAL HISTORY:  Social History   Socioeconomic History  . Marital status: Divorced    Spouse name: Not on file  . Number of children: 2  .  Years of education: Not on file  . Highest education level: Not on file  Occupational History  . Not on file  Social Needs  . Financial resource strain: Not on file  . Food insecurity:    Worry: Not on file    Inability: Not on file  . Transportation needs:    Medical: Not on file    Non-medical: Not on file  Tobacco Use  . Smoking status: Former Smoker    Packs/day: 2.50    Years: 27.00    Pack years: 67.50    Types: Cigarettes    Last attempt to quit: 02/12/1981    Years since quitting: 37.0  . Smokeless tobacco: Never Used  Substance and Sexual Activity  . Alcohol use: No  . Drug use: No  . Sexual activity: Not Currently  Lifestyle  . Physical activity:    Days per week: Not on file    Minutes per session: Not on file  . Stress: Not on file  Relationships  . Social connections:    Talks on phone: Not on file    Gets together: Not on file    Attends religious service: Not on file    Active member of club or organization: Not on file    Attends meetings of clubs or organizations: Not on file    Relationship status: Not on file  . Intimate partner violence:    Fear of current or ex partner: Not  on file    Emotionally abused: Not on file    Physically abused: Not on file    Forced sexual activity: Not on file  Other Topics Concern  . Not on file  Social History Narrative   Lives locally. Works part time at the [employer].       PHYSICAL EXAMINATION:  Vital Signs:   Vitals:   02/07/18 1000  BP: 137/66  Pulse: 83  Resp: 14  Temp: 98.2 F (36.8 C)  SpO2: 94%   Filed Weights   02/07/18 1000  Weight: 231 lb 14.4 oz (105.2 kg)   General: Well-nourished, well-appearing female in no acute distress.  She is unaccompanied today.   HEENT: Head is normocephalic.  Pupils equal and reactive to light. Conjunctivae clear without exudate.  Sclerae anicteric. Oral mucosa is pink, moist.  Oropharynx is pink without lesions or erythema.  Lymph: No cervical, supraclavicular, or infraclavicular lymphadenopathy noted on palpation.  Cardiovascular: Regular rate and rhythm.Marland Kitchen Respiratory: Clear to auscultation bilaterally. Chest expansion symmetric; breathing non-labored.  Breasts: left breast s/p lumpectomy, no sign of local recurrence, right breast benign GI: Abdomen soft and round; non-tender, non-distended. Bowel sounds normoactive.  GU: Deferred.  Neuro: No focal deficits. Steady gait.  Psych: Mood and affect normal and appropriate for situation.  Extremities: No edema. MSK: No focal spinal tenderness to palpation.  Full range of motion in bilateral upper extremities Skin: Warm and dry.  LABORATORY DATA:  None for this visit.  DIAGNOSTIC IMAGING:  None for this visit.      ASSESSMENT AND PLAN:  Ms.. Mariani is a pleasant 77 y.o. female with Stage IA left breast invasive papillary carcinoma, ER+/PR+/HER2-, diagnosed in 08/2017, treated with lumpectomy, and anti-estrogen therapy with Tamoxifen beginning in 11/2017.  She presents to the Survivorship Clinic for our initial meeting and routine follow-up post-completion of treatment for breast cancer.     1. Stage IA /left breast cancer:  Ms. Espericueta is continuing to recover from definitive treatment for breast cancer. She  will follow-up with her medical oncologist, [name] in 6 months with history and physical exam per surveillance protocol.  She will continue her anti-estrogen therapy with Tamoxifen daily. Thus far, she is tolerating the Tamoxifen well, with minimal side effects.  I ordered her repeat mammogram in 04/2017 and a bone density to be done that same day at the breast center as well.  Today, a comprehensive survivorship care plan and treatment summary was reviewed with the patient today detailing her breast cancer diagnosis, treatment course, potential late/long-term effects of treatment, appropriate follow-up care with recommendations for the future, and patient education resources.  A copy of this summary, along with a letter will be sent to the patient's primary care provider via mail/fax/In Basket message after today's visit.    2. Left shoulder arthritis and recent falls: Being evaluated by PCP, she is going to undergo physical therapy for this.    3. Bone health:  Given Ms. Vicuna's age/history of breast cancer and recent frequent falls, she is at slight risk for bone demineralization.  I recommended bone density testing and counseled her that the Tamoxifen will have a protective effect on her bones.  She will undergo bone density testing when she does her mammogram.  She was given education on specific activities to promote bone health.  4. Cancer screening:  Due to Ms. Sconyers's history and her age, she should receive screening for skin cancers, colon cancer, and gynecologic cancers.  The information and recommendations are listed on the patient's comprehensive care plan/treatment summary and were reviewed in detail with the patient.    5. Health maintenance and wellness promotion: Ms. Arbuthnot was encouraged to consume 5-7 servings of fruits and vegetables per day. We reviewed  the "Nutrition Rainbow" handout, as well as the handout "Take Control of Your Health and Reduce Your Cancer Risk" from the [facility].  She was also encouraged to engage in moderate to vigorous exercise for 30 minutes per day most days of the week. We discussed the [employer] fitness program, which is designed for cancer survivors to help them become more physically fit after cancer treatments.  She was instructed to limit her alcohol consumption and continue to abstain from tobacco use.     6. Support services/counseling: It is not uncommon for this period of the patient's cancer care trajectory to be one of many emotions and stressors.  We discussed an opportunity for her to participate in the next session of [facility] ("Finding Your New Normal") support group series designed for patients after they have completed treatment.   Ms. Gilkison was encouraged to take advantage of our many other support services programs, support groups, and/or counseling in coping with her new life as a cancer survivor after completing anti-cancer treatment.  She was offered support today through active listening and expressive supportive counseling.  She was given information regarding our available services and encouraged to contact me with any questions or for help enrolling in any of our support group/programs.    Dispo:   -Return to cancer center in 6 months for f/u with [name] -Mammogram due in 04/2018 -Bone density in 04/2018 -Follow up with surgery  -She is welcome to return back to the Survivorship Clinic at any time; no additional follow-up needed at this time.  -Consider referral back to survivorship as a long-term survivor for continued surveillance  A total of (30) minutes of face-to-face time was spent with this patient with greater than 50% of that time in  counseling and care-coordination.   Gardenia Phlegm, NP Survivorship Program [facility] [phone]   Note:  PRIMARY CARE PROVIDER Celene Squibb, [place] [phone]

## 2018-02-21 DIAGNOSIS — I10 Essential (primary) hypertension: Secondary | ICD-10-CM | POA: Diagnosis not present

## 2018-02-21 DIAGNOSIS — R599 Enlarged lymph nodes, unspecified: Secondary | ICD-10-CM | POA: Diagnosis not present

## 2018-02-21 DIAGNOSIS — E119 Type 2 diabetes mellitus without complications: Secondary | ICD-10-CM | POA: Diagnosis not present

## 2018-02-21 DIAGNOSIS — E782 Mixed hyperlipidemia: Secondary | ICD-10-CM | POA: Diagnosis not present

## 2018-02-23 DIAGNOSIS — Z1211 Encounter for screening for malignant neoplasm of colon: Secondary | ICD-10-CM | POA: Diagnosis not present

## 2018-02-23 DIAGNOSIS — Z1212 Encounter for screening for malignant neoplasm of rectum: Secondary | ICD-10-CM | POA: Diagnosis not present

## 2018-02-24 ENCOUNTER — Other Ambulatory Visit: Payer: Self-pay

## 2018-02-24 ENCOUNTER — Ambulatory Visit (HOSPITAL_COMMUNITY): Payer: PPO | Attending: Orthopaedic Surgery | Admitting: Occupational Therapy

## 2018-02-24 ENCOUNTER — Encounter (HOSPITAL_COMMUNITY): Payer: Self-pay | Admitting: Occupational Therapy

## 2018-02-24 DIAGNOSIS — R29898 Other symptoms and signs involving the musculoskeletal system: Secondary | ICD-10-CM | POA: Insufficient documentation

## 2018-02-24 DIAGNOSIS — G8929 Other chronic pain: Secondary | ICD-10-CM | POA: Insufficient documentation

## 2018-02-24 DIAGNOSIS — M25612 Stiffness of left shoulder, not elsewhere classified: Secondary | ICD-10-CM | POA: Diagnosis not present

## 2018-02-24 DIAGNOSIS — M25512 Pain in left shoulder: Secondary | ICD-10-CM | POA: Insufficient documentation

## 2018-02-24 NOTE — Patient Instructions (Signed)
SHOULDER: Flexion On Table   Place hands on towel placed on table, elbows straight. Lean forward with you upper body, pushing towel away from body.  _10__ reps per set, _3__ sets per day  Abduction (Passive)   With arm out to side, resting on towel placed on table, keeping trunk away from table, lean to the side while pushing towel away from body.  Repeat _10___ times. Do __3__ sessions per day.  Copyright  VHI. All rights reserved.     Internal Rotation (Assistive)   Seated with elbow bent at right angle and held against side, slide arm on table surface in an inward arc keeping elbow anchored in place. Repeat __10__ times. Do __3__ sessions per day. Activity: Use this motion to brush crumbs off the table.  Copyright  VHI. All rights reserved.    1) Seated Row   Sit up straight with elbows by your sides. Pull back with shoulders/elbows, keeping forearms straight, as if pulling back on the reins of a horse. Squeeze shoulder blades together. Repeat _10__times, _3___sets/day    2) Shoulder Elevation    Sit up straight with arms by your sides. Slowly bring your shoulders up towards your ears. Repeat_10__times, __3__ sets/day    3) Shoulder Extension    Sit up straight with both arms by your side, draw your arms back behind your waist. Keep your elbows straight. Repeat _10___times, __3__sets/day.

## 2018-02-24 NOTE — Therapy (Signed)
[place], [place], [address] Phone: [phone]   Fax:  [phone]  Occupational Therapy Evaluation  Patient Details  Name: MCKENZE SLONE MRN: [id number] Date of Birth: 06-Dec-1940 Referring Provider (OT): Dr. Joni Fears   Encounter Date: 02/24/2018  OT End of Session - 02/24/18 1206    Visit Number  1    Number of Visits  8    Date for OT Re-Evaluation  03/26/18    Authorization Type  [employer]    Authorization Time Period  $10 copay    OT Start Time  1119    OT Stop Time  1200    OT Time Calculation (min)  41 min    Activity Tolerance  Patient tolerated treatment well    Behavior During Therapy  [facility] for tasks assessed/performed       Past Medical History:  Diagnosis Date  . Arthritis    "qwhere" (07/01/2012)  . Asthma    normal chest x-ray in 2010  . Basal cell carcinoma of face   . Cholelithiasis   . Chronic lower back pain   . Degenerative joint disease    Right TKA-2010; low back pain; hands and hips also affected  . Exertional shortness of breath    "mostly from being too fat" (07/01/2012)  . GERD (gastroesophageal reflux disease)    history  . H/O hiatal hernia   . Heart murmur    small  . Hemorrhoid   . HTN (hypertension)   . Hyperlipidemia   . Macular degeneration    "both eyes" (07/01/2012)  . PUD (peptic ulcer disease) 2003   Upper GI bleed-gastric ulcer; gastroesophageal reflux disease  . Seasonal allergies   . Type II diabetes mellitus ([place])     Past Surgical History:  Procedure Laterality Date  . APPENDECTOMY    . BREAST LUMPECTOMY WITH RADIOACTIVE SEED LOCALIZATION Left 10/21/2017   Procedure: BREAST LUMPECTOMY WITH RADIOACTIVE SEED LOCALIZATION;  Surgeon: Fanny Skates, MD;  Location: [place];  Service: General;  Laterality: Left;  . CATARACT EXTRACTION BILATERAL W/ ANTERIOR VITRECTOMY Bilateral   . EYE SURGERY Bilateral    "laser in eyes  to stop bleeding and injections for macular degeneration" (07/01/2012)  . FOOT SURGERY Bilateral    straighten 1st toe with a wedge.  Marland Kitchen KNEE ARTHROSCOPY Bilateral   . SKIN CANCER EXCISION  2013   "face" (07/01/2012)  . TOTAL KNEE ARTHROPLASTY Right 2011  . TOTAL KNEE ARTHROPLASTY Left 07/01/2012  . TOTAL KNEE ARTHROPLASTY Left 07/01/2012   Procedure: LEFT TOTAL KNEE ARTHROPLASTY;  Surgeon: Garald Balding, MD;  Location: [place];  Service: Orthopedics;  Laterality: Left;  Left Total Knee Arthroplasty    There were no vitals filed for this visit.  Subjective Assessment - 02/24/18 1147    Subjective   S: This shoulder has just been hurting more and more.     Pertinent History  Pt is a 78 y/o female presenting with left shoulder pain present for approximately 2 years. Pt had an x-ray which showed arthritis, has not had any additional imaging. Pt was referred to occupational therapy for evaluation and treatment by Dr. Joni Fears.     Limitations  Pt has macular degeneration and difficulty reading materials.     Special Tests  FOTO Score: 44/100    Patient Stated Goals  To have less pain and be able to use my arm more.     Currently in Pain?  Yes    Pain Score  4     Pain Location  Shoulder    Pain Orientation  Left    Pain Descriptors / Indicators  Aching;Sore    Pain Type  Chronic pain    Pain Radiating Towards  neck and shoulder blade    Pain Onset  More than a month ago    Pain Frequency  Intermittent    Aggravating Factors   movement    Pain Relieving Factors  heat, pain cream, pain medication    Effect of Pain on Daily Activities  mod effect on ADL completion    Multiple Pain Sites  No        [facility] OT Assessment - 02/24/18 1118      Assessment   Medical Diagnosis  left shoulder pain    Referring Provider (OT)  Dr. Joni Fears    Onset Date/Surgical Date  --   approximately 2 years   Hand Dominance  Right    Next MD Visit  not yet scheduled    Prior Therapy  None       Precautions   Precautions  None      Restrictions   Weight Bearing Restrictions  No      Balance Screen   Has the patient fallen in the past 6 months  Yes    How many times?  3    Has the patient had a decrease in activity level because of a fear of falling?   No    Is the patient reluctant to leave their home because of a fear of falling?   No      Prior Function   Level of Independence  Independent    Vocation  Retired    Leisure  yardwork, Oceanographer, going to [employer]      ADL   ADL comments  Pt is having difficulty with bathing, dressing-coat and bra, reaching overhead and behind back, sleeping, and cooking-lifting pots and pans      Written Expression   Dominant Hand  Right      Cognition   Overall Cognitive Status  Within Functional Limits for tasks assessed      Observation/Other Assessments   Focus on Therapeutic Outcomes (FOTO)   44/100      ROM / Strength   AROM / PROM / Strength  AROM;PROM;Strength      Palpation   Palpation comment  moderate fascial restrictions in left upper arm, deltoid, trapezius, scapularis regions       AROM   Overall AROM Comments  Assessed seated, er/IR adducted    AROM Assessment Site  Shoulder    Right/Left Shoulder  Left    Left Shoulder Flexion  133 Degrees    Left Shoulder ABduction  58 Degrees    Left Shoulder Internal Rotation  90 Degrees    Left Shoulder External Rotation  39 Degrees      PROM   Overall PROM Comments  Assessed supine, er/IR adducted    PROM Assessment Site  Shoulder    Right/Left Shoulder  Left    Left Shoulder Flexion  130 Degrees    Left Shoulder ABduction  77 Degrees    Left Shoulder Internal Rotation  90 Degrees    Left Shoulder External Rotation  42 Degrees      Strength   Overall Strength Comments  Assessed seated, er/IR adducted    Strength Assessment Site  Shoulder    Right/Left Shoulder  Left  Left Shoulder Flexion  4/5    Left Shoulder ABduction  3+/5    Left Shoulder Internal Rotation   4/5    Left Shoulder External Rotation  4-/5                      OT Education - 02/24/18 1146    Education Details  table slides, scapular A/ROM    Person(s) Educated  Patient    Methods  Explanation;Demonstration;Handout    Comprehension  Verbalized understanding;Returned demonstration       OT Short Term Goals - 02/24/18 1212      OT SHORT TERM GOAL #1   Title  Pt will be provided with and educated on HEP for improved use of LUE during functional tasks.     Time  4    Period  Weeks    Status  New    Target Date  03/26/18      OT SHORT TERM GOAL #2   Title  Pt will decrease LUE pain to 3/10 or less to improve ability to sleep consecutively for 5 or more hours.     Time  4    Period  Weeks    Status  New      OT SHORT TERM GOAL #3   Title  Pt will decrease LUE fascial restrictions to minimal amounts or less to improve mobility required for functional reaching tasks.     Time  4    Period  Weeks    Status  New      OT SHORT TERM GOAL #4   Title  Pt will increase LUE A/ROM to [facility] to improve ability to reach overhead and behind back for dressing tasks.     Time  4    Period  Weeks    Status  New      OT SHORT TERM GOAL #5   Title  Pt will increase LUE strength to 4/5 or greater to improve ability to lift pots and pans during cooking tasks.     Time  4    Period  Weeks    Status  New               Plan - 02/24/18 1207    Clinical Impression Statement  A: Pt is a 78 y/o female presenting with left chronic shoulder pain limiting ability to use LUE as non-dominant during functional tasks. Pt provided with table slide HEP and demonstrates understanding.     Occupational Profile and client history currently impacting functional performance  Pt is independent and is motivated to return to highest level of functioning.     Occupational performance deficits (Please refer to evaluation for details):  ADL's;IADL's;Rest and Sleep;Leisure    Rehab Potential   Good    Current Impairments/barriers affecting progress:  Pt has macular degeneration and has difficulty reading materials    OT Frequency  2x / week    OT Duration  4 weeks    OT Treatment/Interventions  Self-care/ADL training;Moist Heat;Therapeutic activities;Ultrasound;Therapeutic exercise;Passive range of motion;Electrical Stimulation;Manual Therapy;Patient/family education    Plan  P: Pt will benefit from skilled OT services to decrease pain and fascial restrictions, increase ROM, strength, and functional use of LUE. Treatment plan: myofascial release, manual therapy, P/ROM, AA/ROM, A/ROM, general LUE strengthening and stability, modalities prn    Clinical Decision Making  Limited treatment options, no task modification necessary    Consulted and Agree with Plan of Care  Patient  Patient will benefit from skilled therapeutic intervention in order to improve the following deficits and impairments:  Decreased range of motion, Impaired flexibility, Decreased activity tolerance, Increased fascial restrictions, Impaired UE functional use, Pain, Decreased strength  Visit Diagnosis: Chronic left shoulder pain  Stiffness of left shoulder, not elsewhere classified  Other symptoms and signs involving the musculoskeletal system    Problem List Patient Active Problem List   Diagnosis Date Noted  . Malignant neoplasm of upper-outer quadrant of left breast in female, estrogen receptor positive ([place]) 09/18/2017  . Osteoarthritis of left knee 07/03/2012  . Hx of diagnostic tests 05/16/2012  . Hyperlipidemia   . Asthma   . Diabetes mellitus ([place])   . PUD (peptic ulcer disease)   . Degenerative joint disease   . Cholelithiasis   . MACULAR DEGENERATION 12/07/2008  . HYPERTENSION 12/07/2008   Guadelupe Sabin, OTR/L  [phone] 02/24/2018, 12:16 PM  [facility] [address] [place], [place], [address] Phone: [phone]   Fax:   [phone]  Name: DARL BRISBIN MRN: [id number] Date of Birth: [birth year]

## 2018-02-28 ENCOUNTER — Encounter (HOSPITAL_COMMUNITY): Payer: Self-pay | Admitting: Specialist

## 2018-02-28 ENCOUNTER — Encounter

## 2018-02-28 ENCOUNTER — Ambulatory Visit (HOSPITAL_COMMUNITY): Payer: PPO | Admitting: Specialist

## 2018-02-28 DIAGNOSIS — M25512 Pain in left shoulder: Secondary | ICD-10-CM | POA: Diagnosis not present

## 2018-02-28 DIAGNOSIS — M25612 Stiffness of left shoulder, not elsewhere classified: Secondary | ICD-10-CM

## 2018-02-28 DIAGNOSIS — G8929 Other chronic pain: Secondary | ICD-10-CM

## 2018-02-28 DIAGNOSIS — R29898 Other symptoms and signs involving the musculoskeletal system: Secondary | ICD-10-CM

## 2018-02-28 NOTE — Therapy (Signed)
[place], [place], [address] Phone: [phone]   Fax:  [phone]  Occupational Therapy Treatment  Patient Details  Name: Shelly [name] MRN: [id number] Date of Birth: 01-16-1941 Referring Provider (OT): Dr. Joni Fears   Encounter Date: 02/28/2018  OT End of Session - 02/28/18 1340    Visit Number  2    Number of Visits  8    Date for OT Re-Evaluation  03/26/18    Authorization Type  [employer]    Authorization Time Period  $10 copay    OT Start Time  1125    OT Stop Time  1205    OT Time Calculation (min)  40 min    Activity Tolerance  Patient tolerated treatment well    Behavior During Therapy  [facility] for tasks assessed/performed       Past Medical History:  Diagnosis Date  . Arthritis    "qwhere" (07/01/2012)  . Asthma    normal chest x-ray in 2010  . Basal cell carcinoma of face   . Cholelithiasis   . Chronic lower back pain   . Degenerative joint disease    Right TKA-2010; low back pain; hands and hips also affected  . Exertional shortness of breath    "mostly from being too fat" (07/01/2012)  . GERD (gastroesophageal reflux disease)    history  . H/O hiatal hernia   . Heart murmur    small  . Hemorrhoid   . HTN (hypertension)   . Hyperlipidemia   . Macular degeneration    "both eyes" (07/01/2012)  . PUD (peptic ulcer disease) 2003   Upper GI bleed-gastric ulcer; gastroesophageal reflux disease  . Seasonal allergies   . Type II diabetes mellitus ([place])     Past Surgical History:  Procedure Laterality Date  . APPENDECTOMY    . BREAST LUMPECTOMY WITH RADIOACTIVE SEED LOCALIZATION Left 10/21/2017   Procedure: BREAST LUMPECTOMY WITH RADIOACTIVE SEED LOCALIZATION;  Surgeon: Fanny Skates, MD;  Location: [place];  Service: General;  Laterality: Left;  . CATARACT EXTRACTION BILATERAL W/ ANTERIOR VITRECTOMY Bilateral   . EYE SURGERY Bilateral    "laser in eyes  to stop bleeding and injections for macular degeneration" (07/01/2012)  . FOOT SURGERY Bilateral    straighten 1st toe with a wedge.  Marland Kitchen KNEE ARTHROSCOPY Bilateral   . SKIN CANCER EXCISION  2013   "face" (07/01/2012)  . TOTAL KNEE ARTHROPLASTY Right 2011  . TOTAL KNEE ARTHROPLASTY Left 07/01/2012  . TOTAL KNEE ARTHROPLASTY Left 07/01/2012   Procedure: LEFT TOTAL KNEE ARTHROPLASTY;  Surgeon: Garald Balding, MD;  Location: [place];  Service: Orthopedics;  Laterality: Left;  Left Total Knee Arthroplasty    There were no vitals filed for this visit.  Subjective Assessment - 02/28/18 1338    Subjective   S:  I have been doing my exercises 3 times per day.  It is feeling better.     Currently in Pain?  Yes    Pain Score  2     Pain Location  Shoulder    Pain Orientation  Left    Pain Descriptors / Indicators  Aching         [facility] OT Assessment - 02/28/18 0001      Assessment   Medical Diagnosis  left shoulder pain    Referring Provider (OT)  Dr. Joni Fears               OT  Treatments/Exercises (OP) - 02/28/18 0001      Manual Therapy   Manual Therapy  Myofascial release    Manual therapy comments  manual therapy completed seperately from all other interventions this date of service    Myofascial Release  myofacial release and manual stretching to left upper arm, scapular              OT Education - 02/28/18 1338    Education Details  reviewed treatment goals, advised patient that she can complete any exercises performed in clinic at home, as well.     Person(s) Educated  Patient    Methods  Explanation    Comprehension  Verbalized understanding       OT Short Term Goals - 02/28/18 1145      OT SHORT TERM GOAL #1   Title  Pt will be provided with and educated on HEP for improved use of LUE during functional tasks.     Time  4    Period  Weeks    Status  On-going      OT SHORT TERM GOAL #2   Title  Pt will decrease LUE pain to 3/10 or less to improve  ability to sleep consecutively for 5 or more hours.     Time  4    Period  Weeks    Status  On-going      OT SHORT TERM GOAL #3   Title  Pt will decrease LUE fascial restrictions to minimal amounts or less to improve mobility required for functional reaching tasks.     Time  4    Period  Weeks    Status  On-going      OT SHORT TERM GOAL #4   Title  Pt will increase LUE A/ROM to [facility] to improve ability to reach overhead and behind back for dressing tasks.     Time  4    Period  Weeks    Status  On-going      OT SHORT TERM GOAL #5   Title  Pt will increase LUE strength to 4/5 or greater to improve ability to lift pots and pans during cooking tasks.     Time  4    Period  Weeks    Status  On-going               Plan - 02/28/18 1340    Clinical Impression Statement  A:  Patient reports compliance with HEP.  Patient able to complete aa/rom in supine this date with good form, some verbal guidance for technique.  Patient treatment focused on improving available pain free range and improving prximal shouldr stability.  Patient is concerned about cost of therapy.    Plan  P:  Follow up on ball stretches at home.  Discuss possible options for therapy due to concerns for cost of therapy. attempt a/rom in supine and aa/rom in standing.  complete ball circles with greater independence and less facilitation from therapist.     Consulted and Agree with Plan of Care  Patient       Patient will benefit from skilled therapeutic intervention in order to improve the following deficits and impairments:  Decreased range of motion, Impaired flexibility, Decreased activity tolerance, Increased fascial restrictions, Impaired UE functional use, Pain, Decreased strength  Visit Diagnosis: Chronic left shoulder pain  Stiffness of left shoulder, not elsewhere classified  Other symptoms and signs involving the musculoskeletal system    Problem List Patient Active Problem List  Diagnosis Date  Noted  . Malignant neoplasm of upper-outer quadrant of left breast in female, estrogen receptor positive ([place]) 09/18/2017  . Osteoarthritis of left knee 07/03/2012  . Hx of diagnostic tests 05/16/2012  . Hyperlipidemia   . Asthma   . Diabetes mellitus ([place])   . PUD (peptic ulcer disease)   . Degenerative joint disease   . Cholelithiasis   . MACULAR DEGENERATION 12/07/2008  . HYPERTENSION 12/07/2008   Vangie Bicker, [place], OTR/L [phone]  02/28/2018, 1:43 PM  [facility] [address] [place], [place], [address] Phone: [phone]   Fax:  [phone]  Name: Shelly [name] MRN: [id number] Date of Birth: [birth year]

## 2018-03-03 ENCOUNTER — Telehealth (HOSPITAL_COMMUNITY): Payer: Self-pay | Admitting: Physical Therapy

## 2018-03-03 ENCOUNTER — Telehealth (HOSPITAL_COMMUNITY): Payer: Self-pay | Admitting: Internal Medicine

## 2018-03-03 NOTE — Telephone Encounter (Signed)
03/03/18  I left patient a message that there were appointments already scheduled for her and her evaluation appt will now be on Wed. 1/22 at 9;45 and if she couldn't come to that to call us and let us know.

## 2018-03-03 NOTE — Telephone Encounter (Signed)
Pt called to confirm we cx tomorrow due to daughter having surgery. Confirmed that it was cx and her eval is scheduled for Wed 1/22. Pt states she will call us on a day to day bases if she can not come. NF 03/03/2018

## 2018-03-03 NOTE — Telephone Encounter (Signed)
03/03/18  pt left message to cx this appt because her daughter is having surgery

## 2018-03-04 ENCOUNTER — Ambulatory Visit (HOSPITAL_COMMUNITY): Payer: PPO

## 2018-03-05 ENCOUNTER — Ambulatory Visit (HOSPITAL_COMMUNITY): Payer: PPO | Admitting: Physical Therapy

## 2018-03-05 ENCOUNTER — Telehealth (HOSPITAL_COMMUNITY): Payer: Self-pay | Admitting: Internal Medicine

## 2018-03-05 NOTE — Telephone Encounter (Signed)
03/05/18  pt called and asked that we cancel all appts... her sister fell and was hurt pretty bad and her daughter has had surgery... she will call back when she is ready to schedule

## 2018-03-06 ENCOUNTER — Ambulatory Visit (HOSPITAL_COMMUNITY): Payer: PPO

## 2018-03-06 ENCOUNTER — Encounter

## 2018-03-10 DIAGNOSIS — E1169 Type 2 diabetes mellitus with other specified complication: Secondary | ICD-10-CM | POA: Diagnosis not present

## 2018-03-10 DIAGNOSIS — E559 Vitamin D deficiency, unspecified: Secondary | ICD-10-CM | POA: Diagnosis not present

## 2018-03-10 DIAGNOSIS — E782 Mixed hyperlipidemia: Secondary | ICD-10-CM | POA: Diagnosis not present

## 2018-03-10 DIAGNOSIS — E119 Type 2 diabetes mellitus without complications: Secondary | ICD-10-CM | POA: Diagnosis not present

## 2018-03-10 DIAGNOSIS — D509 Iron deficiency anemia, unspecified: Secondary | ICD-10-CM | POA: Diagnosis not present

## 2018-03-10 DIAGNOSIS — I10 Essential (primary) hypertension: Secondary | ICD-10-CM | POA: Diagnosis not present

## 2018-03-11 ENCOUNTER — Encounter (HOSPITAL_COMMUNITY): Payer: PPO

## 2018-03-14 ENCOUNTER — Encounter (HOSPITAL_COMMUNITY): Payer: PPO

## 2018-03-14 ENCOUNTER — Encounter

## 2018-03-15 DIAGNOSIS — M19019 Primary osteoarthritis, unspecified shoulder: Secondary | ICD-10-CM | POA: Diagnosis not present

## 2018-03-15 DIAGNOSIS — H3530 Unspecified macular degeneration: Secondary | ICD-10-CM | POA: Diagnosis not present

## 2018-03-15 DIAGNOSIS — D530 Protein deficiency anemia: Secondary | ICD-10-CM | POA: Diagnosis not present

## 2018-03-15 DIAGNOSIS — E1169 Type 2 diabetes mellitus with other specified complication: Secondary | ICD-10-CM | POA: Diagnosis not present

## 2018-03-15 DIAGNOSIS — D509 Iron deficiency anemia, unspecified: Secondary | ICD-10-CM | POA: Diagnosis not present

## 2018-03-15 DIAGNOSIS — E875 Hyperkalemia: Secondary | ICD-10-CM | POA: Diagnosis not present

## 2018-03-15 DIAGNOSIS — M19049 Primary osteoarthritis, unspecified hand: Secondary | ICD-10-CM | POA: Diagnosis not present

## 2018-03-15 DIAGNOSIS — N182 Chronic kidney disease, stage 2 (mild): Secondary | ICD-10-CM | POA: Diagnosis not present

## 2018-03-15 DIAGNOSIS — E782 Mixed hyperlipidemia: Secondary | ICD-10-CM | POA: Diagnosis not present

## 2018-03-15 DIAGNOSIS — I10 Essential (primary) hypertension: Secondary | ICD-10-CM | POA: Diagnosis not present

## 2018-03-15 DIAGNOSIS — E559 Vitamin D deficiency, unspecified: Secondary | ICD-10-CM | POA: Diagnosis not present

## 2018-03-17 ENCOUNTER — Encounter (HOSPITAL_COMMUNITY): Payer: PPO

## 2018-03-17 ENCOUNTER — Encounter (HOSPITAL_COMMUNITY): Payer: PPO | Admitting: Specialist

## 2018-03-21 ENCOUNTER — Encounter (HOSPITAL_COMMUNITY): Payer: PPO | Admitting: Occupational Therapy

## 2018-03-21 ENCOUNTER — Encounter (HOSPITAL_COMMUNITY): Payer: PPO | Admitting: Physical Therapy

## 2018-03-25 ENCOUNTER — Encounter (HOSPITAL_COMMUNITY): Payer: PPO | Admitting: Occupational Therapy

## 2018-03-25 ENCOUNTER — Encounter (HOSPITAL_COMMUNITY): Payer: PPO

## 2018-03-26 DIAGNOSIS — E1169 Type 2 diabetes mellitus with other specified complication: Secondary | ICD-10-CM | POA: Diagnosis not present

## 2018-03-26 DIAGNOSIS — E782 Mixed hyperlipidemia: Secondary | ICD-10-CM | POA: Diagnosis not present

## 2018-03-26 DIAGNOSIS — N182 Chronic kidney disease, stage 2 (mild): Secondary | ICD-10-CM | POA: Diagnosis not present

## 2018-03-26 DIAGNOSIS — I10 Essential (primary) hypertension: Secondary | ICD-10-CM | POA: Diagnosis not present

## 2018-03-28 ENCOUNTER — Encounter (HOSPITAL_COMMUNITY): Payer: PPO

## 2018-03-28 ENCOUNTER — Encounter (HOSPITAL_COMMUNITY): Payer: PPO | Admitting: Occupational Therapy

## 2018-04-01 ENCOUNTER — Encounter (HOSPITAL_COMMUNITY): Payer: PPO | Admitting: Occupational Therapy

## 2018-04-01 ENCOUNTER — Encounter (HOSPITAL_COMMUNITY): Payer: PPO

## 2018-04-04 ENCOUNTER — Encounter (HOSPITAL_COMMUNITY): Payer: PPO | Admitting: Occupational Therapy

## 2018-04-04 ENCOUNTER — Encounter (HOSPITAL_COMMUNITY): Payer: PPO

## 2018-04-08 ENCOUNTER — Encounter (HOSPITAL_COMMUNITY): Payer: PPO

## 2018-04-08 ENCOUNTER — Encounter (HOSPITAL_COMMUNITY): Payer: PPO | Admitting: Occupational Therapy

## 2018-04-11 ENCOUNTER — Encounter (HOSPITAL_COMMUNITY): Payer: PPO | Admitting: Occupational Therapy

## 2018-04-11 ENCOUNTER — Encounter (HOSPITAL_COMMUNITY): Payer: PPO

## 2018-04-17 DIAGNOSIS — E782 Mixed hyperlipidemia: Secondary | ICD-10-CM | POA: Diagnosis not present

## 2018-04-17 DIAGNOSIS — N182 Chronic kidney disease, stage 2 (mild): Secondary | ICD-10-CM | POA: Diagnosis not present

## 2018-04-17 DIAGNOSIS — E1169 Type 2 diabetes mellitus with other specified complication: Secondary | ICD-10-CM | POA: Diagnosis not present

## 2018-04-17 DIAGNOSIS — I10 Essential (primary) hypertension: Secondary | ICD-10-CM | POA: Diagnosis not present

## 2018-04-23 ENCOUNTER — Ambulatory Visit
Admission: RE | Admit: 2018-04-23 | Discharge: 2018-04-23 | Disposition: A | Discharge status: Home / Self Care | Payer: PPO | Source: Ambulatory Visit | Attending: Adult Health | Admitting: Adult Health

## 2018-04-23 ENCOUNTER — Other Ambulatory Visit: Payer: Self-pay

## 2018-04-23 ENCOUNTER — Telehealth: Payer: Self-pay

## 2018-04-23 DIAGNOSIS — E2839 Other primary ovarian failure: Secondary | ICD-10-CM

## 2018-04-23 DIAGNOSIS — Z780 Asymptomatic menopausal state: Secondary | ICD-10-CM | POA: Diagnosis not present

## 2018-04-23 DIAGNOSIS — M85852 Other specified disorders of bone density and structure, left thigh: Secondary | ICD-10-CM | POA: Diagnosis not present

## 2018-04-23 DIAGNOSIS — R928 Other abnormal and inconclusive findings on diagnostic imaging of breast: Secondary | ICD-10-CM | POA: Diagnosis not present

## 2018-04-23 DIAGNOSIS — Z853 Personal history of malignant neoplasm of breast: Secondary | ICD-10-CM | POA: Diagnosis not present

## 2018-04-23 DIAGNOSIS — C50412 Malignant neoplasm of upper-outer quadrant of left female breast: Secondary | ICD-10-CM

## 2018-04-23 DIAGNOSIS — Z170 Estrogen receptor positive status [ER+]: Principal | ICD-10-CM

## 2018-04-23 NOTE — Telephone Encounter (Signed)
Spoke with patient to inform hat BD shows osteopenia.  Per NP recommendation take calcium and vitamin d plus weight bearing exercise.  Patient states she takes 2000 IU of Vit D daily but no calcium supplement, only through her diet.  Patient would like to know if it would safe for her to take prenatal vitamins.  She states "I took them once before and I felt great."  Nurse to pass information along to NP to advise.

## 2018-04-23 NOTE — Telephone Encounter (Signed)
-----   Message from Gardenia Phlegm, NP sent at 04/23/2018 12:25 PM EDT ----- Bone density shows osteopenia, recommend calcium, vitamin d and weight bearing exercises. ----- Message ----- From: Interface, Rad Results In Sent: 04/23/2018  12:13 PM EDT To: Gardenia Phlegm, NP

## 2018-05-13 ENCOUNTER — Encounter (HOSPITAL_COMMUNITY): Payer: Self-pay | Admitting: Occupational Therapy

## 2018-05-13 NOTE — Therapy (Signed)
[place], [place], [address] Phone: [phone]   Fax:  [phone]  Patient Details  Name: Shelly [name] MRN: [id number] Date of Birth: Dec 07, 1940 Referring Provider:  No ref. provider found  Encounter Date: 05/13/2018  OCCUPATIONAL THERAPY DISCHARGE SUMMARY  Visits from Start of Care: 2  Current functional level related to goals / functional outcomes: Unknown. Pt called and requested discharge as her sister fell and is requiring assistance due to injuries.    Remaining deficits: unknown   Education / Equipment: HEP Plan: Patient agrees to discharge.  Patient goals were not met. Patient is being discharged due to the patient's request.  ?????       Guadelupe Sabin, OTR/L  [phone] 05/13/2018, 12:20 PM  [facility] [address] [place], [place], [address] Phone: [phone]   Fax:  [phone]

## 2018-05-20 DIAGNOSIS — I10 Essential (primary) hypertension: Secondary | ICD-10-CM | POA: Diagnosis not present

## 2018-05-20 DIAGNOSIS — N182 Chronic kidney disease, stage 2 (mild): Secondary | ICD-10-CM | POA: Diagnosis not present

## 2018-05-20 DIAGNOSIS — E1169 Type 2 diabetes mellitus with other specified complication: Secondary | ICD-10-CM | POA: Diagnosis not present

## 2018-05-20 DIAGNOSIS — E782 Mixed hyperlipidemia: Secondary | ICD-10-CM | POA: Diagnosis not present

## 2018-06-03 ENCOUNTER — Telehealth (HOSPITAL_COMMUNITY): Payer: Self-pay | Admitting: Physical Therapy

## 2018-06-03 NOTE — Telephone Encounter (Signed)
Spoke with patient -Pt has been riding her bike and working on her arm, she wants to cx this referral.

## 2018-06-07 DIAGNOSIS — Z0000 Encounter for general adult medical examination without abnormal findings: Secondary | ICD-10-CM | POA: Diagnosis not present

## 2018-06-14 DIAGNOSIS — M25511 Pain in right shoulder: Secondary | ICD-10-CM | POA: Diagnosis not present

## 2018-06-14 DIAGNOSIS — M19011 Primary osteoarthritis, right shoulder: Secondary | ICD-10-CM | POA: Diagnosis not present

## 2018-06-14 DIAGNOSIS — M542 Cervicalgia: Secondary | ICD-10-CM | POA: Diagnosis not present

## 2018-07-03 ENCOUNTER — Encounter (INDEPENDENT_AMBULATORY_CARE_PROVIDER_SITE_OTHER): Payer: PPO | Admitting: Ophthalmology

## 2018-07-03 ENCOUNTER — Other Ambulatory Visit: Payer: Self-pay

## 2018-07-03 DIAGNOSIS — H353231 Exudative age-related macular degeneration, bilateral, with active choroidal neovascularization: Secondary | ICD-10-CM

## 2018-07-03 DIAGNOSIS — H43813 Vitreous degeneration, bilateral: Secondary | ICD-10-CM

## 2018-07-03 DIAGNOSIS — H35033 Hypertensive retinopathy, bilateral: Secondary | ICD-10-CM | POA: Diagnosis not present

## 2018-07-03 DIAGNOSIS — I10 Essential (primary) hypertension: Secondary | ICD-10-CM

## 2018-07-12 DIAGNOSIS — E1169 Type 2 diabetes mellitus with other specified complication: Secondary | ICD-10-CM | POA: Diagnosis not present

## 2018-07-12 DIAGNOSIS — E782 Mixed hyperlipidemia: Secondary | ICD-10-CM | POA: Diagnosis not present

## 2018-07-12 DIAGNOSIS — N182 Chronic kidney disease, stage 2 (mild): Secondary | ICD-10-CM | POA: Diagnosis not present

## 2018-07-12 DIAGNOSIS — I10 Essential (primary) hypertension: Secondary | ICD-10-CM | POA: Diagnosis not present

## 2018-07-15 DIAGNOSIS — H3530 Unspecified macular degeneration: Secondary | ICD-10-CM | POA: Diagnosis not present

## 2018-07-15 DIAGNOSIS — E1122 Type 2 diabetes mellitus with diabetic chronic kidney disease: Secondary | ICD-10-CM | POA: Diagnosis not present

## 2018-07-15 DIAGNOSIS — D530 Protein deficiency anemia: Secondary | ICD-10-CM | POA: Diagnosis not present

## 2018-07-15 DIAGNOSIS — M545 Low back pain: Secondary | ICD-10-CM | POA: Diagnosis not present

## 2018-07-15 DIAGNOSIS — M159 Polyosteoarthritis, unspecified: Secondary | ICD-10-CM | POA: Diagnosis not present

## 2018-07-15 DIAGNOSIS — M25519 Pain in unspecified shoulder: Secondary | ICD-10-CM | POA: Diagnosis not present

## 2018-07-15 DIAGNOSIS — H9313 Tinnitus, bilateral: Secondary | ICD-10-CM | POA: Diagnosis not present

## 2018-07-15 DIAGNOSIS — E782 Mixed hyperlipidemia: Secondary | ICD-10-CM | POA: Diagnosis not present

## 2018-07-15 DIAGNOSIS — N182 Chronic kidney disease, stage 2 (mild): Secondary | ICD-10-CM | POA: Diagnosis not present

## 2018-07-15 DIAGNOSIS — M25559 Pain in unspecified hip: Secondary | ICD-10-CM | POA: Diagnosis not present

## 2018-07-15 DIAGNOSIS — R590 Localized enlarged lymph nodes: Secondary | ICD-10-CM | POA: Diagnosis not present

## 2018-07-15 DIAGNOSIS — I10 Essential (primary) hypertension: Secondary | ICD-10-CM | POA: Diagnosis not present

## 2018-07-15 DIAGNOSIS — M25512 Pain in left shoulder: Secondary | ICD-10-CM | POA: Diagnosis not present

## 2018-07-15 DIAGNOSIS — E1169 Type 2 diabetes mellitus with other specified complication: Secondary | ICD-10-CM | POA: Diagnosis not present

## 2018-07-15 DIAGNOSIS — M19012 Primary osteoarthritis, left shoulder: Secondary | ICD-10-CM | POA: Diagnosis not present

## 2018-07-15 DIAGNOSIS — Z6841 Body Mass Index (BMI) 40.0 and over, adult: Secondary | ICD-10-CM | POA: Diagnosis not present

## 2018-07-15 DIAGNOSIS — H9319 Tinnitus, unspecified ear: Secondary | ICD-10-CM | POA: Diagnosis not present

## 2018-07-15 DIAGNOSIS — E559 Vitamin D deficiency, unspecified: Secondary | ICD-10-CM | POA: Diagnosis not present

## 2018-07-15 DIAGNOSIS — D509 Iron deficiency anemia, unspecified: Secondary | ICD-10-CM | POA: Diagnosis not present

## 2018-07-15 DIAGNOSIS — Z23 Encounter for immunization: Secondary | ICD-10-CM | POA: Diagnosis not present

## 2018-07-15 DIAGNOSIS — E875 Hyperkalemia: Secondary | ICD-10-CM | POA: Diagnosis not present

## 2018-08-04 NOTE — Assessment & Plan Note (Signed)
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  Current treatment plan: Adj antiestrogen therapy with Tamoxifen 20 mg daily x5 years started 10/29/2017.  Tamoxifen toxicities:  Breast cancer surveillance: 04/23/2018: Benign breast density category B Bone density 04/23/2018: Osteopenia, T score -1.6, recommended calcium and vitamin D  Return to clinic in 1 year for follow-up

## 2018-08-05 ENCOUNTER — Telehealth: Payer: Self-pay | Admitting: Hematology and Oncology

## 2018-08-05 NOTE — Telephone Encounter (Signed)
I talk with patient she stated that she is legally blind

## 2018-08-07 ENCOUNTER — Telehealth: Payer: Self-pay | Admitting: Hematology and Oncology

## 2018-08-07 DIAGNOSIS — R600 Localized edema: Secondary | ICD-10-CM | POA: Diagnosis not present

## 2018-08-07 NOTE — Progress Notes (Signed)
  HEMATOLOGY-ONCOLOGY TELEPHONE VISIT PROGRESS NOTE  I connected with Shelly [name] on 08/08/2018 at 10:30 AM EDT by telephone and verified that I am speaking with the correct person using two identifiers.  I discussed the limitations, risks, security and privacy concerns of performing an evaluation and management service by telephone and the availability of in person appointments.  I also discussed with the patient that there may be a patient responsible charge related to this service. The patient expressed understanding and agreed to proceed.   History of Present Illness: Shelly [name] is a 78 y.o. female with above-mentioned history of left breast cancer treated with left lumpectomy and who is currently on anti-estrogen therapy with tamoxifen. I last saw her 9 months ago. In the interim she was seen by Wilber Bihari, NP for [facility]. Mammogram on 04/23/18 showed no evidence of malignancy bilaterally. Bone density scan on 04/23/18 showed a T-score of -1.6. She presents over the phone today for annual follow-up.  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%, HER-2 negative ratio 1.27 copy #1.9 T1 a N0 stage Ia   10/21/2017 Surgery   10/21/17: Left Lumpectomy: Encapsulated papillary carcinoma 0.8 cm, grade 2, Margins Neg, ER 95%, PR 75%, HER-2 negative ratio 1.27 copy #1.9 T1 a N0 stage Ia   12/2017 -  Anti-estrogen oral therapy   Tamoxifen daily     Observations/Objective:  No clinical evidence of recurrence   Assessment Plan:  Malignant neoplasm of upper-outer quadrant of left breast in female, estrogen receptor positive ([place]) 08/27/2017:Screening detected left breast mass UOQ at  1:00: 0.4 cm on 05/28/2017, follow-up on 08/13/2017 it was 0.5 cm 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  Current treatment plan: Adj antiestrogen therapy with Tamoxifen 20 mg daily x5 years started 10/29/2017.  Tamoxifen toxicities: No hot flashes since you are taking in AM  Breast cancer surveillance: 04/23/2018: Benign breast density category B Bone density 04/23/2018: Osteopenia, T score -1.6, recommended calcium and vitamin D She exercises (Unable to work out at [employer])  Return to clinic in 1 year for follow-up  I discussed the assessment and treatment plan with the patient. The patient was provided an opportunity to ask questions and all were answered. The patient agreed with the plan and demonstrated an understanding of the instructions. The patient was advised to call back or seek an in-person evaluation if the symptoms worsen or if the condition fails to improve as anticipated.   I provided 15 minutes of non-face-to-face time during this encounter.   Rulon Eisenmenger, MD 08/08/2018    I, Molly Dorshimer, am acting as scribe for Nicholas Lose, MD.  I have reviewed the above documentation for accuracy and completeness, and I agree with the above.

## 2018-08-08 ENCOUNTER — Inpatient Hospital Stay: Payer: PPO | Attending: Hematology and Oncology | Admitting: Hematology and Oncology

## 2018-08-08 DIAGNOSIS — Z170 Estrogen receptor positive status [ER+]: Secondary | ICD-10-CM | POA: Diagnosis not present

## 2018-08-08 DIAGNOSIS — Z79810 Long term (current) use of selective estrogen receptor modulators (SERMs): Secondary | ICD-10-CM | POA: Diagnosis not present

## 2018-08-08 DIAGNOSIS — C50412 Malignant neoplasm of upper-outer quadrant of left female breast: Secondary | ICD-10-CM | POA: Diagnosis not present

## 2018-08-11 ENCOUNTER — Telehealth: Payer: Self-pay | Admitting: Hematology and Oncology

## 2018-08-11 NOTE — Telephone Encounter (Signed)
I talk with patient regarding schedule  

## 2018-08-21 DIAGNOSIS — R600 Localized edema: Secondary | ICD-10-CM | POA: Diagnosis not present

## 2018-09-12 ENCOUNTER — Other Ambulatory Visit: Payer: Self-pay

## 2018-09-15 DIAGNOSIS — I10 Essential (primary) hypertension: Secondary | ICD-10-CM | POA: Diagnosis not present

## 2018-09-15 DIAGNOSIS — E1122 Type 2 diabetes mellitus with diabetic chronic kidney disease: Secondary | ICD-10-CM | POA: Diagnosis not present

## 2018-09-15 DIAGNOSIS — N182 Chronic kidney disease, stage 2 (mild): Secondary | ICD-10-CM | POA: Diagnosis not present

## 2018-09-15 DIAGNOSIS — E782 Mixed hyperlipidemia: Secondary | ICD-10-CM | POA: Diagnosis not present

## 2018-09-15 DIAGNOSIS — M1611 Unilateral primary osteoarthritis, right hip: Secondary | ICD-10-CM | POA: Diagnosis not present

## 2018-09-15 DIAGNOSIS — M25552 Pain in left hip: Secondary | ICD-10-CM | POA: Diagnosis not present

## 2018-09-15 DIAGNOSIS — D0580 Other specified type of carcinoma in situ of unspecified breast: Secondary | ICD-10-CM | POA: Diagnosis not present

## 2018-09-15 DIAGNOSIS — D509 Iron deficiency anemia, unspecified: Secondary | ICD-10-CM | POA: Diagnosis not present

## 2018-09-15 DIAGNOSIS — D530 Protein deficiency anemia: Secondary | ICD-10-CM | POA: Diagnosis not present

## 2018-09-15 DIAGNOSIS — E559 Vitamin D deficiency, unspecified: Secondary | ICD-10-CM | POA: Diagnosis not present

## 2018-09-15 DIAGNOSIS — E1169 Type 2 diabetes mellitus with other specified complication: Secondary | ICD-10-CM | POA: Diagnosis not present

## 2018-09-15 DIAGNOSIS — E875 Hyperkalemia: Secondary | ICD-10-CM | POA: Diagnosis not present

## 2018-09-30 DIAGNOSIS — R600 Localized edema: Secondary | ICD-10-CM | POA: Diagnosis not present

## 2018-10-06 ENCOUNTER — Other Ambulatory Visit: Payer: Self-pay | Admitting: Dermatology

## 2018-10-06 DIAGNOSIS — L281 Prurigo nodularis: Secondary | ICD-10-CM | POA: Diagnosis not present

## 2018-10-06 DIAGNOSIS — L821 Other seborrheic keratosis: Secondary | ICD-10-CM | POA: Diagnosis not present

## 2018-10-06 DIAGNOSIS — D485 Neoplasm of uncertain behavior of skin: Secondary | ICD-10-CM | POA: Diagnosis not present

## 2018-10-06 DIAGNOSIS — D229 Melanocytic nevi, unspecified: Secondary | ICD-10-CM | POA: Diagnosis not present

## 2018-10-21 DIAGNOSIS — I10 Essential (primary) hypertension: Secondary | ICD-10-CM | POA: Diagnosis not present

## 2018-10-21 DIAGNOSIS — E119 Type 2 diabetes mellitus without complications: Secondary | ICD-10-CM | POA: Diagnosis not present

## 2018-10-21 DIAGNOSIS — N182 Chronic kidney disease, stage 2 (mild): Secondary | ICD-10-CM | POA: Diagnosis not present

## 2018-10-21 DIAGNOSIS — E1169 Type 2 diabetes mellitus with other specified complication: Secondary | ICD-10-CM | POA: Diagnosis not present

## 2018-10-21 DIAGNOSIS — E559 Vitamin D deficiency, unspecified: Secondary | ICD-10-CM | POA: Diagnosis not present

## 2018-10-21 DIAGNOSIS — D509 Iron deficiency anemia, unspecified: Secondary | ICD-10-CM | POA: Diagnosis not present

## 2018-10-21 DIAGNOSIS — E782 Mixed hyperlipidemia: Secondary | ICD-10-CM | POA: Diagnosis not present

## 2018-10-21 DIAGNOSIS — D530 Protein deficiency anemia: Secondary | ICD-10-CM | POA: Diagnosis not present

## 2018-10-21 DIAGNOSIS — E1122 Type 2 diabetes mellitus with diabetic chronic kidney disease: Secondary | ICD-10-CM | POA: Diagnosis not present

## 2018-10-28 DIAGNOSIS — E782 Mixed hyperlipidemia: Secondary | ICD-10-CM | POA: Diagnosis not present

## 2018-10-28 DIAGNOSIS — N182 Chronic kidney disease, stage 2 (mild): Secondary | ICD-10-CM | POA: Diagnosis not present

## 2018-10-28 DIAGNOSIS — M25512 Pain in left shoulder: Secondary | ICD-10-CM | POA: Diagnosis not present

## 2018-10-28 DIAGNOSIS — I10 Essential (primary) hypertension: Secondary | ICD-10-CM | POA: Diagnosis not present

## 2018-10-28 DIAGNOSIS — E875 Hyperkalemia: Secondary | ICD-10-CM | POA: Diagnosis not present

## 2018-10-28 DIAGNOSIS — E559 Vitamin D deficiency, unspecified: Secondary | ICD-10-CM | POA: Diagnosis not present

## 2018-10-28 DIAGNOSIS — D530 Protein deficiency anemia: Secondary | ICD-10-CM | POA: Diagnosis not present

## 2018-10-28 DIAGNOSIS — H3530 Unspecified macular degeneration: Secondary | ICD-10-CM | POA: Diagnosis not present

## 2018-10-28 DIAGNOSIS — D509 Iron deficiency anemia, unspecified: Secondary | ICD-10-CM | POA: Diagnosis not present

## 2018-10-28 DIAGNOSIS — E1169 Type 2 diabetes mellitus with other specified complication: Secondary | ICD-10-CM | POA: Diagnosis not present

## 2018-10-28 DIAGNOSIS — Z23 Encounter for immunization: Secondary | ICD-10-CM | POA: Diagnosis not present

## 2018-10-28 DIAGNOSIS — E1122 Type 2 diabetes mellitus with diabetic chronic kidney disease: Secondary | ICD-10-CM | POA: Diagnosis not present

## 2018-10-31 DIAGNOSIS — D530 Protein deficiency anemia: Secondary | ICD-10-CM | POA: Diagnosis not present

## 2018-10-31 DIAGNOSIS — H3530 Unspecified macular degeneration: Secondary | ICD-10-CM | POA: Diagnosis not present

## 2018-10-31 DIAGNOSIS — I10 Essential (primary) hypertension: Secondary | ICD-10-CM | POA: Diagnosis not present

## 2018-10-31 DIAGNOSIS — N182 Chronic kidney disease, stage 2 (mild): Secondary | ICD-10-CM | POA: Diagnosis not present

## 2018-10-31 DIAGNOSIS — Z23 Encounter for immunization: Secondary | ICD-10-CM | POA: Diagnosis not present

## 2018-10-31 DIAGNOSIS — E782 Mixed hyperlipidemia: Secondary | ICD-10-CM | POA: Diagnosis not present

## 2018-10-31 DIAGNOSIS — D509 Iron deficiency anemia, unspecified: Secondary | ICD-10-CM | POA: Diagnosis not present

## 2018-10-31 DIAGNOSIS — E1169 Type 2 diabetes mellitus with other specified complication: Secondary | ICD-10-CM | POA: Diagnosis not present

## 2018-10-31 DIAGNOSIS — M25512 Pain in left shoulder: Secondary | ICD-10-CM | POA: Diagnosis not present

## 2018-10-31 DIAGNOSIS — E559 Vitamin D deficiency, unspecified: Secondary | ICD-10-CM | POA: Diagnosis not present

## 2018-10-31 DIAGNOSIS — E875 Hyperkalemia: Secondary | ICD-10-CM | POA: Diagnosis not present

## 2018-10-31 DIAGNOSIS — E1122 Type 2 diabetes mellitus with diabetic chronic kidney disease: Secondary | ICD-10-CM | POA: Diagnosis not present

## 2018-12-03 DIAGNOSIS — E1122 Type 2 diabetes mellitus with diabetic chronic kidney disease: Secondary | ICD-10-CM | POA: Diagnosis not present

## 2018-12-03 DIAGNOSIS — D509 Iron deficiency anemia, unspecified: Secondary | ICD-10-CM | POA: Diagnosis not present

## 2018-12-03 DIAGNOSIS — E782 Mixed hyperlipidemia: Secondary | ICD-10-CM | POA: Diagnosis not present

## 2018-12-03 DIAGNOSIS — Z23 Encounter for immunization: Secondary | ICD-10-CM | POA: Diagnosis not present

## 2018-12-03 DIAGNOSIS — N182 Chronic kidney disease, stage 2 (mild): Secondary | ICD-10-CM | POA: Diagnosis not present

## 2018-12-03 DIAGNOSIS — E559 Vitamin D deficiency, unspecified: Secondary | ICD-10-CM | POA: Diagnosis not present

## 2018-12-03 DIAGNOSIS — E875 Hyperkalemia: Secondary | ICD-10-CM | POA: Diagnosis not present

## 2018-12-03 DIAGNOSIS — E1169 Type 2 diabetes mellitus with other specified complication: Secondary | ICD-10-CM | POA: Diagnosis not present

## 2018-12-03 DIAGNOSIS — D530 Protein deficiency anemia: Secondary | ICD-10-CM | POA: Diagnosis not present

## 2018-12-03 DIAGNOSIS — M25512 Pain in left shoulder: Secondary | ICD-10-CM | POA: Diagnosis not present

## 2018-12-03 DIAGNOSIS — I10 Essential (primary) hypertension: Secondary | ICD-10-CM | POA: Diagnosis not present

## 2018-12-03 DIAGNOSIS — H3530 Unspecified macular degeneration: Secondary | ICD-10-CM | POA: Diagnosis not present

## 2018-12-16 DIAGNOSIS — E1169 Type 2 diabetes mellitus with other specified complication: Secondary | ICD-10-CM | POA: Diagnosis not present

## 2018-12-16 DIAGNOSIS — H3530 Unspecified macular degeneration: Secondary | ICD-10-CM | POA: Diagnosis not present

## 2018-12-16 DIAGNOSIS — D509 Iron deficiency anemia, unspecified: Secondary | ICD-10-CM | POA: Diagnosis not present

## 2018-12-16 DIAGNOSIS — N182 Chronic kidney disease, stage 2 (mild): Secondary | ICD-10-CM | POA: Diagnosis not present

## 2018-12-16 DIAGNOSIS — E1122 Type 2 diabetes mellitus with diabetic chronic kidney disease: Secondary | ICD-10-CM | POA: Diagnosis not present

## 2018-12-16 DIAGNOSIS — E782 Mixed hyperlipidemia: Secondary | ICD-10-CM | POA: Diagnosis not present

## 2018-12-16 DIAGNOSIS — I10 Essential (primary) hypertension: Secondary | ICD-10-CM | POA: Diagnosis not present

## 2018-12-16 DIAGNOSIS — D530 Protein deficiency anemia: Secondary | ICD-10-CM | POA: Diagnosis not present

## 2018-12-16 DIAGNOSIS — M1611 Unilateral primary osteoarthritis, right hip: Secondary | ICD-10-CM | POA: Diagnosis not present

## 2018-12-16 DIAGNOSIS — E559 Vitamin D deficiency, unspecified: Secondary | ICD-10-CM | POA: Diagnosis not present

## 2018-12-16 DIAGNOSIS — E875 Hyperkalemia: Secondary | ICD-10-CM | POA: Diagnosis not present

## 2018-12-16 DIAGNOSIS — M19012 Primary osteoarthritis, left shoulder: Secondary | ICD-10-CM | POA: Diagnosis not present

## 2018-12-17 DIAGNOSIS — H3530 Unspecified macular degeneration: Secondary | ICD-10-CM | POA: Diagnosis not present

## 2018-12-17 DIAGNOSIS — E119 Type 2 diabetes mellitus without complications: Secondary | ICD-10-CM | POA: Diagnosis not present

## 2018-12-17 DIAGNOSIS — E669 Obesity, unspecified: Secondary | ICD-10-CM | POA: Diagnosis not present

## 2018-12-17 DIAGNOSIS — Z96653 Presence of artificial knee joint, bilateral: Secondary | ICD-10-CM | POA: Diagnosis not present

## 2018-12-17 DIAGNOSIS — I10 Essential (primary) hypertension: Secondary | ICD-10-CM | POA: Diagnosis not present

## 2018-12-17 DIAGNOSIS — C50412 Malignant neoplasm of upper-outer quadrant of left female breast: Secondary | ICD-10-CM | POA: Diagnosis not present

## 2019-01-09 ENCOUNTER — Other Ambulatory Visit: Payer: Self-pay | Admitting: Hematology and Oncology

## 2019-01-23 DIAGNOSIS — D530 Protein deficiency anemia: Secondary | ICD-10-CM | POA: Diagnosis not present

## 2019-01-23 DIAGNOSIS — I10 Essential (primary) hypertension: Secondary | ICD-10-CM | POA: Diagnosis not present

## 2019-01-23 DIAGNOSIS — N182 Chronic kidney disease, stage 2 (mild): Secondary | ICD-10-CM | POA: Diagnosis not present

## 2019-01-23 DIAGNOSIS — E782 Mixed hyperlipidemia: Secondary | ICD-10-CM | POA: Diagnosis not present

## 2019-01-23 DIAGNOSIS — M19012 Primary osteoarthritis, left shoulder: Secondary | ICD-10-CM | POA: Diagnosis not present

## 2019-01-23 DIAGNOSIS — E559 Vitamin D deficiency, unspecified: Secondary | ICD-10-CM | POA: Diagnosis not present

## 2019-01-23 DIAGNOSIS — E875 Hyperkalemia: Secondary | ICD-10-CM | POA: Diagnosis not present

## 2019-01-23 DIAGNOSIS — E1169 Type 2 diabetes mellitus with other specified complication: Secondary | ICD-10-CM | POA: Diagnosis not present

## 2019-01-23 DIAGNOSIS — E1122 Type 2 diabetes mellitus with diabetic chronic kidney disease: Secondary | ICD-10-CM | POA: Diagnosis not present

## 2019-01-23 DIAGNOSIS — D509 Iron deficiency anemia, unspecified: Secondary | ICD-10-CM | POA: Diagnosis not present

## 2019-01-23 DIAGNOSIS — I129 Hypertensive chronic kidney disease with stage 1 through stage 4 chronic kidney disease, or unspecified chronic kidney disease: Secondary | ICD-10-CM | POA: Diagnosis not present

## 2019-01-23 DIAGNOSIS — H3530 Unspecified macular degeneration: Secondary | ICD-10-CM | POA: Diagnosis not present

## 2019-02-25 DIAGNOSIS — I10 Essential (primary) hypertension: Secondary | ICD-10-CM | POA: Diagnosis not present

## 2019-02-25 DIAGNOSIS — E1169 Type 2 diabetes mellitus with other specified complication: Secondary | ICD-10-CM | POA: Diagnosis not present

## 2019-02-25 DIAGNOSIS — E119 Type 2 diabetes mellitus without complications: Secondary | ICD-10-CM | POA: Diagnosis not present

## 2019-02-25 DIAGNOSIS — E782 Mixed hyperlipidemia: Secondary | ICD-10-CM | POA: Diagnosis not present

## 2019-02-25 DIAGNOSIS — N182 Chronic kidney disease, stage 2 (mild): Secondary | ICD-10-CM | POA: Diagnosis not present

## 2019-02-25 DIAGNOSIS — E1122 Type 2 diabetes mellitus with diabetic chronic kidney disease: Secondary | ICD-10-CM | POA: Diagnosis not present

## 2019-03-05 DIAGNOSIS — E875 Hyperkalemia: Secondary | ICD-10-CM | POA: Diagnosis not present

## 2019-03-05 DIAGNOSIS — E559 Vitamin D deficiency, unspecified: Secondary | ICD-10-CM | POA: Diagnosis not present

## 2019-03-05 DIAGNOSIS — E1169 Type 2 diabetes mellitus with other specified complication: Secondary | ICD-10-CM | POA: Diagnosis not present

## 2019-03-05 DIAGNOSIS — H3530 Unspecified macular degeneration: Secondary | ICD-10-CM | POA: Diagnosis not present

## 2019-03-05 DIAGNOSIS — M25512 Pain in left shoulder: Secondary | ICD-10-CM | POA: Diagnosis not present

## 2019-03-05 DIAGNOSIS — M19012 Primary osteoarthritis, left shoulder: Secondary | ICD-10-CM | POA: Diagnosis not present

## 2019-03-05 DIAGNOSIS — I10 Essential (primary) hypertension: Secondary | ICD-10-CM | POA: Diagnosis not present

## 2019-03-05 DIAGNOSIS — N182 Chronic kidney disease, stage 2 (mild): Secondary | ICD-10-CM | POA: Diagnosis not present

## 2019-03-05 DIAGNOSIS — D509 Iron deficiency anemia, unspecified: Secondary | ICD-10-CM | POA: Diagnosis not present

## 2019-03-05 DIAGNOSIS — Z853 Personal history of malignant neoplasm of breast: Secondary | ICD-10-CM | POA: Diagnosis not present

## 2019-03-05 DIAGNOSIS — E782 Mixed hyperlipidemia: Secondary | ICD-10-CM | POA: Diagnosis not present

## 2019-03-05 DIAGNOSIS — D530 Protein deficiency anemia: Secondary | ICD-10-CM | POA: Diagnosis not present

## 2019-03-11 ENCOUNTER — Other Ambulatory Visit: Payer: Self-pay | Admitting: Adult Health

## 2019-03-11 DIAGNOSIS — Z853 Personal history of malignant neoplasm of breast: Secondary | ICD-10-CM

## 2019-03-31 DIAGNOSIS — I10 Essential (primary) hypertension: Secondary | ICD-10-CM | POA: Diagnosis not present

## 2019-03-31 DIAGNOSIS — E1122 Type 2 diabetes mellitus with diabetic chronic kidney disease: Secondary | ICD-10-CM | POA: Diagnosis not present

## 2019-03-31 DIAGNOSIS — N182 Chronic kidney disease, stage 2 (mild): Secondary | ICD-10-CM | POA: Diagnosis not present

## 2019-03-31 DIAGNOSIS — E1169 Type 2 diabetes mellitus with other specified complication: Secondary | ICD-10-CM | POA: Diagnosis not present

## 2019-03-31 DIAGNOSIS — E782 Mixed hyperlipidemia: Secondary | ICD-10-CM | POA: Diagnosis not present

## 2019-03-31 DIAGNOSIS — I129 Hypertensive chronic kidney disease with stage 1 through stage 4 chronic kidney disease, or unspecified chronic kidney disease: Secondary | ICD-10-CM | POA: Diagnosis not present

## 2019-04-08 ENCOUNTER — Other Ambulatory Visit: Payer: Self-pay | Admitting: Hematology and Oncology

## 2019-04-24 ENCOUNTER — Ambulatory Visit
Admission: RE | Admit: 2019-04-24 | Discharge: 2019-04-24 | Disposition: A | Discharge status: Home / Self Care | Payer: PPO | Source: Ambulatory Visit | Attending: Adult Health | Admitting: Adult Health

## 2019-04-24 ENCOUNTER — Other Ambulatory Visit: Payer: Self-pay | Admitting: Adult Health

## 2019-04-24 ENCOUNTER — Other Ambulatory Visit: Payer: Self-pay

## 2019-04-24 DIAGNOSIS — R928 Other abnormal and inconclusive findings on diagnostic imaging of breast: Secondary | ICD-10-CM | POA: Diagnosis not present

## 2019-04-24 DIAGNOSIS — Z853 Personal history of malignant neoplasm of breast: Secondary | ICD-10-CM | POA: Diagnosis not present

## 2019-05-30 DIAGNOSIS — I129 Hypertensive chronic kidney disease with stage 1 through stage 4 chronic kidney disease, or unspecified chronic kidney disease: Secondary | ICD-10-CM | POA: Diagnosis not present

## 2019-05-30 DIAGNOSIS — E1122 Type 2 diabetes mellitus with diabetic chronic kidney disease: Secondary | ICD-10-CM | POA: Diagnosis not present

## 2019-05-30 DIAGNOSIS — E1169 Type 2 diabetes mellitus with other specified complication: Secondary | ICD-10-CM | POA: Diagnosis not present

## 2019-05-30 DIAGNOSIS — E782 Mixed hyperlipidemia: Secondary | ICD-10-CM | POA: Diagnosis not present

## 2019-05-30 DIAGNOSIS — I10 Essential (primary) hypertension: Secondary | ICD-10-CM | POA: Diagnosis not present

## 2019-05-30 DIAGNOSIS — N182 Chronic kidney disease, stage 2 (mild): Secondary | ICD-10-CM | POA: Diagnosis not present

## 2019-06-08 DIAGNOSIS — C7981 Secondary malignant neoplasm of breast: Secondary | ICD-10-CM | POA: Diagnosis not present

## 2019-06-08 DIAGNOSIS — D530 Protein deficiency anemia: Secondary | ICD-10-CM | POA: Diagnosis not present

## 2019-06-08 DIAGNOSIS — E1122 Type 2 diabetes mellitus with diabetic chronic kidney disease: Secondary | ICD-10-CM | POA: Diagnosis not present

## 2019-06-08 DIAGNOSIS — E1169 Type 2 diabetes mellitus with other specified complication: Secondary | ICD-10-CM | POA: Diagnosis not present

## 2019-06-08 DIAGNOSIS — H3530 Unspecified macular degeneration: Secondary | ICD-10-CM | POA: Diagnosis not present

## 2019-06-08 DIAGNOSIS — E119 Type 2 diabetes mellitus without complications: Secondary | ICD-10-CM | POA: Diagnosis not present

## 2019-06-08 DIAGNOSIS — E875 Hyperkalemia: Secondary | ICD-10-CM | POA: Diagnosis not present

## 2019-06-08 DIAGNOSIS — D509 Iron deficiency anemia, unspecified: Secondary | ICD-10-CM | POA: Diagnosis not present

## 2019-06-08 DIAGNOSIS — E782 Mixed hyperlipidemia: Secondary | ICD-10-CM | POA: Diagnosis not present

## 2019-06-08 DIAGNOSIS — E559 Vitamin D deficiency, unspecified: Secondary | ICD-10-CM | POA: Diagnosis not present

## 2019-06-08 DIAGNOSIS — D0580 Other specified type of carcinoma in situ of unspecified breast: Secondary | ICD-10-CM | POA: Diagnosis not present

## 2019-06-11 DIAGNOSIS — I10 Essential (primary) hypertension: Secondary | ICD-10-CM | POA: Diagnosis not present

## 2019-06-11 DIAGNOSIS — D530 Protein deficiency anemia: Secondary | ICD-10-CM | POA: Diagnosis not present

## 2019-06-11 DIAGNOSIS — D509 Iron deficiency anemia, unspecified: Secondary | ICD-10-CM | POA: Diagnosis not present

## 2019-06-11 DIAGNOSIS — M25512 Pain in left shoulder: Secondary | ICD-10-CM | POA: Diagnosis not present

## 2019-06-11 DIAGNOSIS — E875 Hyperkalemia: Secondary | ICD-10-CM | POA: Diagnosis not present

## 2019-06-11 DIAGNOSIS — N182 Chronic kidney disease, stage 2 (mild): Secondary | ICD-10-CM | POA: Diagnosis not present

## 2019-06-11 DIAGNOSIS — E782 Mixed hyperlipidemia: Secondary | ICD-10-CM | POA: Diagnosis not present

## 2019-06-11 DIAGNOSIS — Z0001 Encounter for general adult medical examination with abnormal findings: Secondary | ICD-10-CM | POA: Diagnosis not present

## 2019-06-11 DIAGNOSIS — E1169 Type 2 diabetes mellitus with other specified complication: Secondary | ICD-10-CM | POA: Diagnosis not present

## 2019-06-11 DIAGNOSIS — H3530 Unspecified macular degeneration: Secondary | ICD-10-CM | POA: Diagnosis not present

## 2019-06-11 DIAGNOSIS — Z853 Personal history of malignant neoplasm of breast: Secondary | ICD-10-CM | POA: Diagnosis not present

## 2019-06-11 DIAGNOSIS — E559 Vitamin D deficiency, unspecified: Secondary | ICD-10-CM | POA: Diagnosis not present

## 2019-07-01 IMAGING — US US SOFT TISSUE HEAD/NECK
1 series · 14 of 17 positions shown · non-contrast
Comparison: None.

CLINICAL DATA: Left submandibular swelling/node.

EXAM:
ULTRASOUND OF HEAD/NECK SOFT TISSUES
TECHNIQUE: Ultrasound examination of the head and neck soft tissues was
performed in the area of clinical concern.

[Series 1: us soft tissue head/neck · 0.04mm/px · 17 acquisitions, 14 frames shown]
[im 1/17]
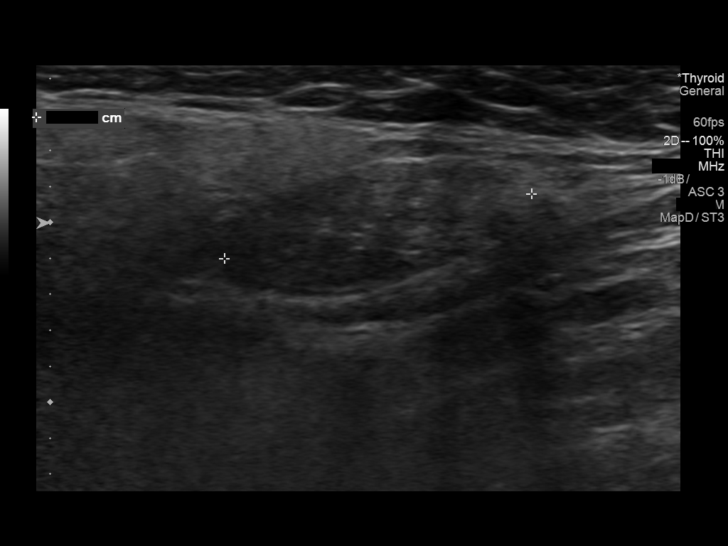
[im 2/17]
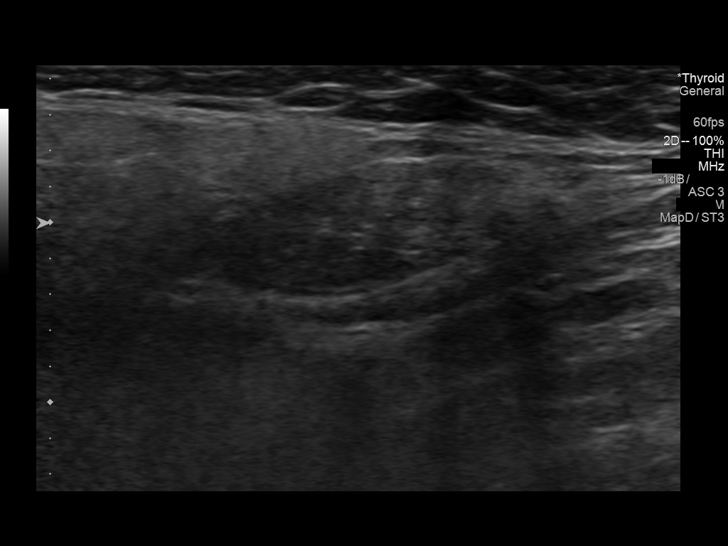
[im 4/17]
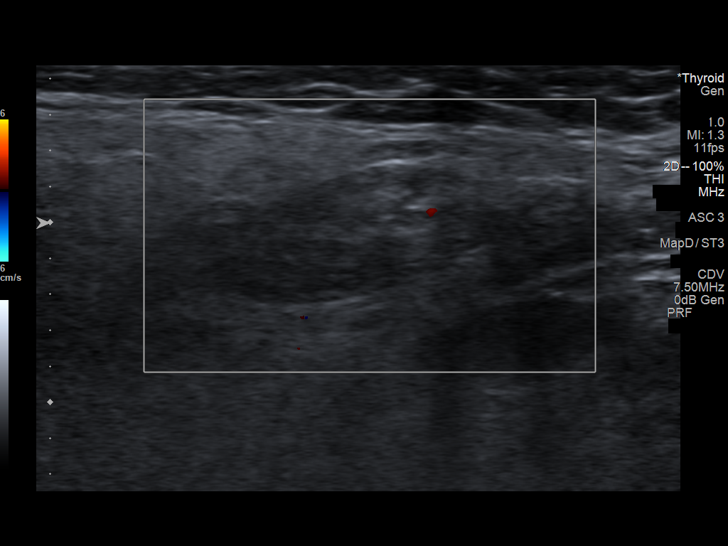
[im 5/17]
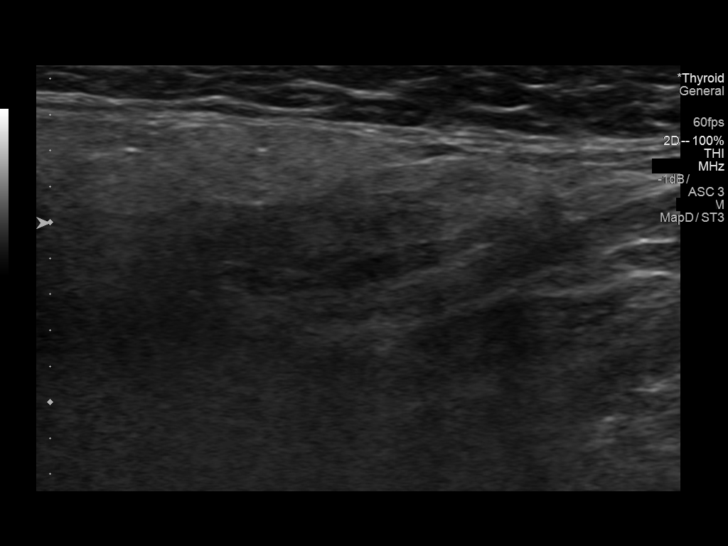
[im 6/17]
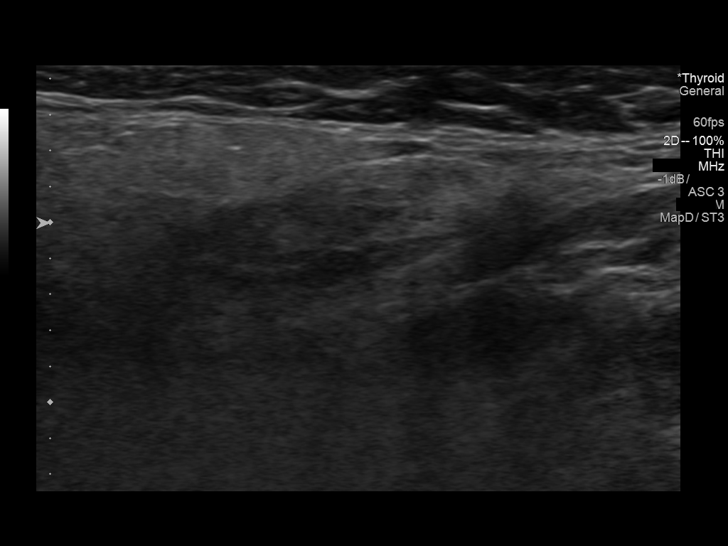
[im 7/17]
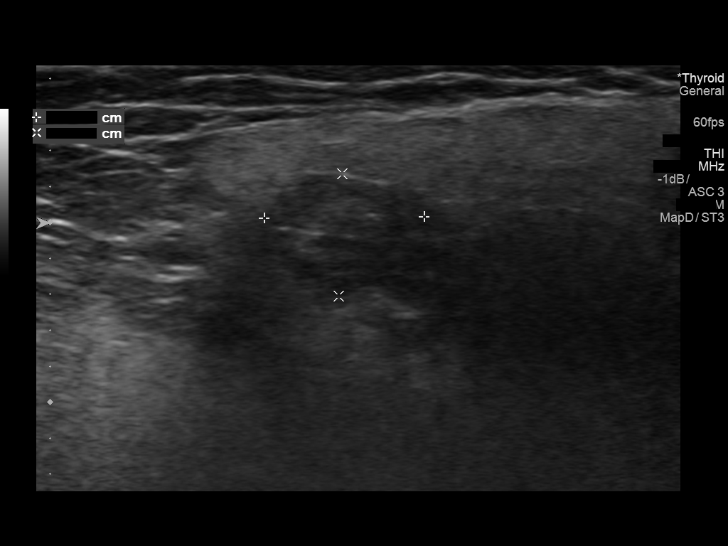
[im 8/17]
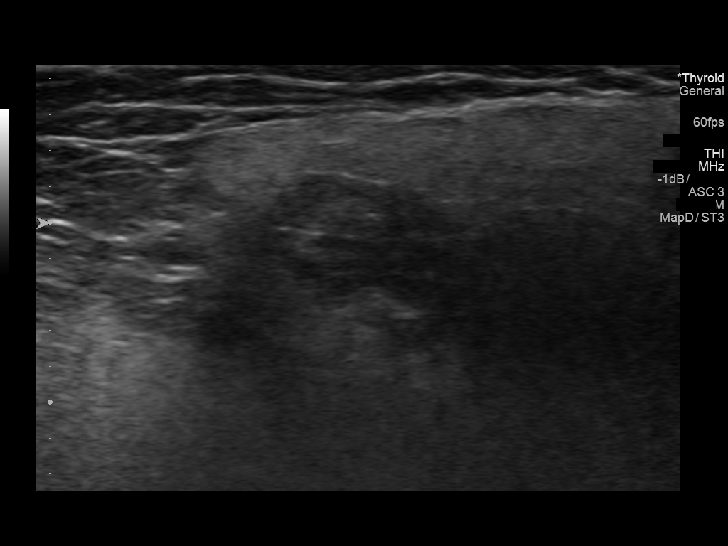
[im 10/17]
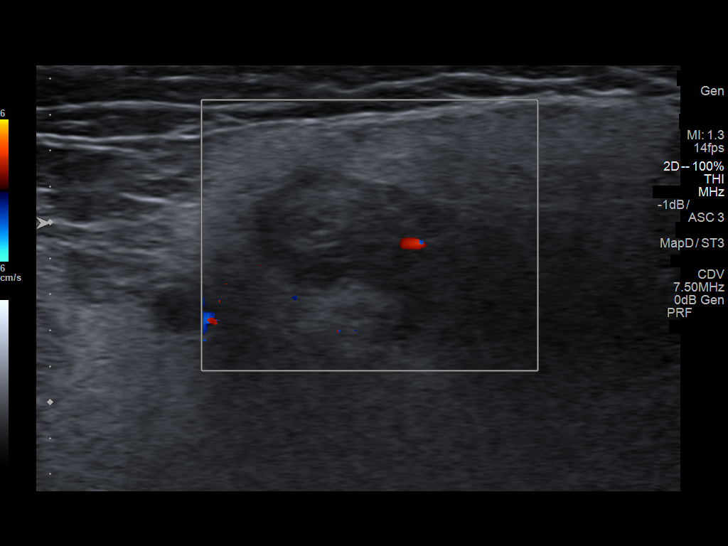
[im 11/17]
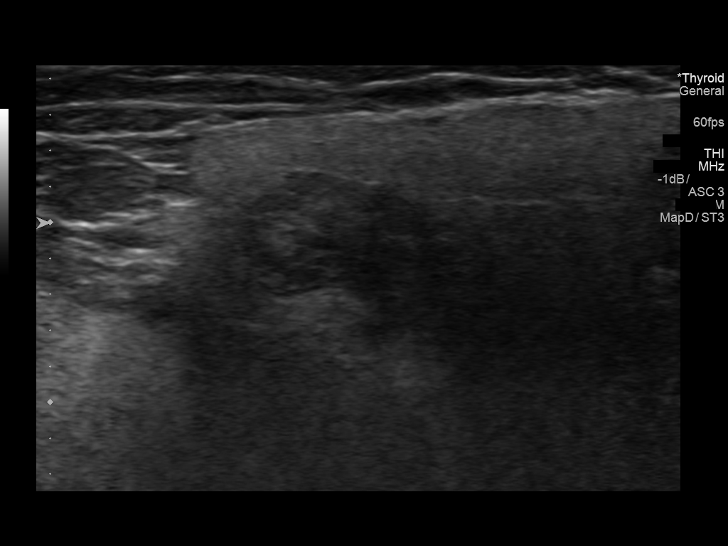
[im 12/17]
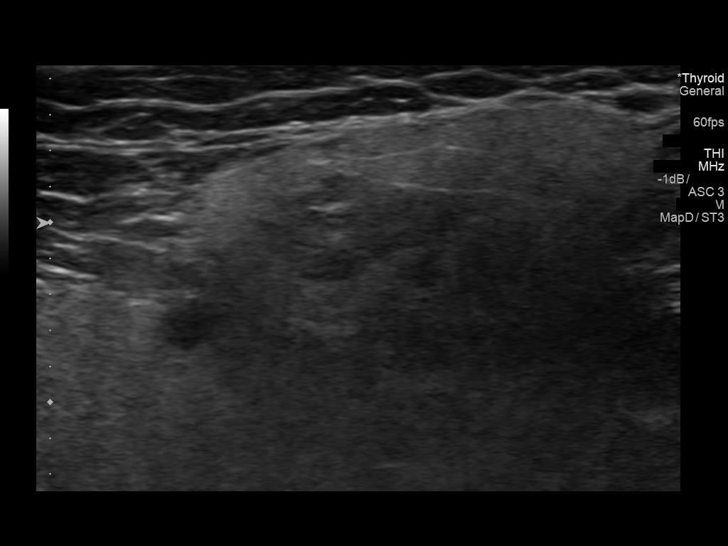
[im 13/17]
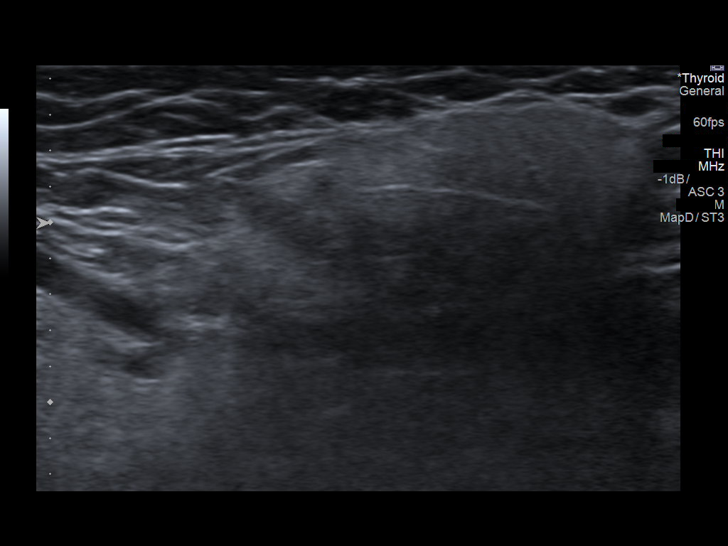
[im 14/17]
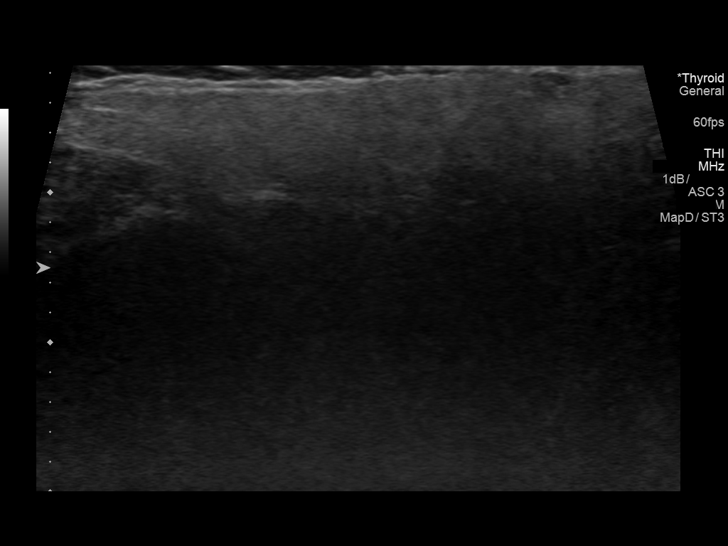
[im 16/17]
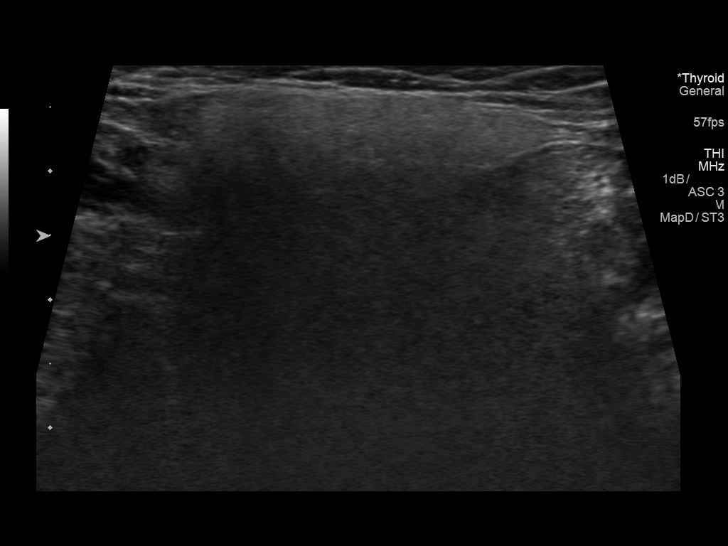
[im 17/17]
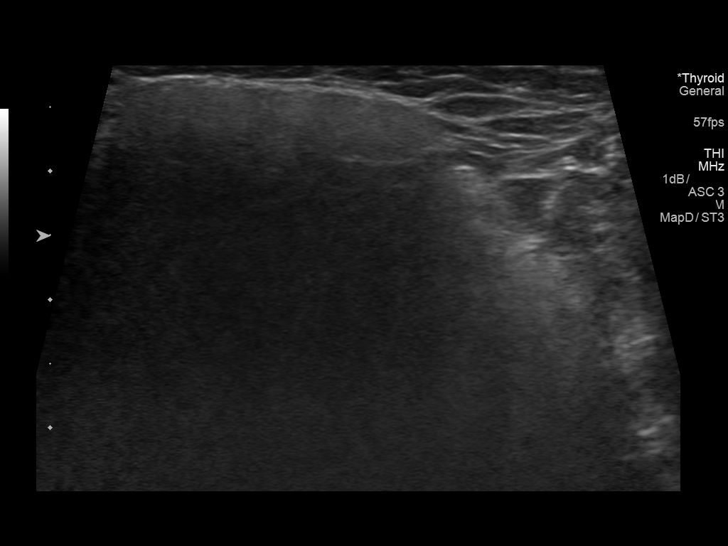

[14 of 17 positions shown; findings below may reference images not displayed]

FINDINGS: In the area labeled as left submandibular there is glandular like
tissue that is presumably the submandibular gland. There is a
somewhat indistinct hypoechoic nodule in or intimately adjacent to
the gland which measures 18 x 7 x 9 mm. This nodule does have a
hilar vascular pattern and may reflect lymph node.
IMPRESSION: 18 x 7 x 9 mm left submandibular nodule favoring enlarged lymph node
immediately adjacent to the submandibular gland. There are no
diagnostic imaging features; if the nodule persists or progresses
recommend neck CT with contrast to exclude generalized adenopathy or
a submandibular mass.

## 2019-07-03 ENCOUNTER — Other Ambulatory Visit: Payer: Self-pay

## 2019-07-03 ENCOUNTER — Encounter (INDEPENDENT_AMBULATORY_CARE_PROVIDER_SITE_OTHER): Payer: PPO | Admitting: Ophthalmology

## 2019-07-03 DIAGNOSIS — D509 Iron deficiency anemia, unspecified: Secondary | ICD-10-CM | POA: Diagnosis not present

## 2019-07-03 DIAGNOSIS — H353231 Exudative age-related macular degeneration, bilateral, with active choroidal neovascularization: Secondary | ICD-10-CM | POA: Diagnosis not present

## 2019-07-03 DIAGNOSIS — Z0001 Encounter for general adult medical examination with abnormal findings: Secondary | ICD-10-CM | POA: Diagnosis not present

## 2019-07-03 DIAGNOSIS — E1122 Type 2 diabetes mellitus with diabetic chronic kidney disease: Secondary | ICD-10-CM | POA: Diagnosis not present

## 2019-07-03 DIAGNOSIS — H35033 Hypertensive retinopathy, bilateral: Secondary | ICD-10-CM

## 2019-07-03 DIAGNOSIS — E782 Mixed hyperlipidemia: Secondary | ICD-10-CM | POA: Diagnosis not present

## 2019-07-03 DIAGNOSIS — E559 Vitamin D deficiency, unspecified: Secondary | ICD-10-CM | POA: Diagnosis not present

## 2019-07-03 DIAGNOSIS — I10 Essential (primary) hypertension: Secondary | ICD-10-CM

## 2019-07-03 DIAGNOSIS — H43813 Vitreous degeneration, bilateral: Secondary | ICD-10-CM | POA: Diagnosis not present

## 2019-07-03 DIAGNOSIS — H3530 Unspecified macular degeneration: Secondary | ICD-10-CM | POA: Diagnosis not present

## 2019-07-03 DIAGNOSIS — N182 Chronic kidney disease, stage 2 (mild): Secondary | ICD-10-CM | POA: Diagnosis not present

## 2019-07-03 DIAGNOSIS — E875 Hyperkalemia: Secondary | ICD-10-CM | POA: Diagnosis not present

## 2019-07-03 DIAGNOSIS — E1169 Type 2 diabetes mellitus with other specified complication: Secondary | ICD-10-CM | POA: Diagnosis not present

## 2019-07-03 DIAGNOSIS — D530 Protein deficiency anemia: Secondary | ICD-10-CM | POA: Diagnosis not present

## 2019-07-03 DIAGNOSIS — I129 Hypertensive chronic kidney disease with stage 1 through stage 4 chronic kidney disease, or unspecified chronic kidney disease: Secondary | ICD-10-CM | POA: Diagnosis not present

## 2019-07-06 ENCOUNTER — Other Ambulatory Visit: Payer: Self-pay | Admitting: Hematology and Oncology

## 2019-07-09 DIAGNOSIS — E782 Mixed hyperlipidemia: Secondary | ICD-10-CM | POA: Diagnosis not present

## 2019-07-09 DIAGNOSIS — D530 Protein deficiency anemia: Secondary | ICD-10-CM | POA: Diagnosis not present

## 2019-07-09 DIAGNOSIS — E559 Vitamin D deficiency, unspecified: Secondary | ICD-10-CM | POA: Diagnosis not present

## 2019-07-09 DIAGNOSIS — H3530 Unspecified macular degeneration: Secondary | ICD-10-CM | POA: Diagnosis not present

## 2019-07-09 DIAGNOSIS — E119 Type 2 diabetes mellitus without complications: Secondary | ICD-10-CM | POA: Diagnosis not present

## 2019-07-09 DIAGNOSIS — C7981 Secondary malignant neoplasm of breast: Secondary | ICD-10-CM | POA: Diagnosis not present

## 2019-07-09 DIAGNOSIS — E875 Hyperkalemia: Secondary | ICD-10-CM | POA: Diagnosis not present

## 2019-07-09 DIAGNOSIS — D509 Iron deficiency anemia, unspecified: Secondary | ICD-10-CM | POA: Diagnosis not present

## 2019-07-09 DIAGNOSIS — D0580 Other specified type of carcinoma in situ of unspecified breast: Secondary | ICD-10-CM | POA: Diagnosis not present

## 2019-07-09 DIAGNOSIS — E1169 Type 2 diabetes mellitus with other specified complication: Secondary | ICD-10-CM | POA: Diagnosis not present

## 2019-07-09 DIAGNOSIS — E1122 Type 2 diabetes mellitus with diabetic chronic kidney disease: Secondary | ICD-10-CM | POA: Diagnosis not present

## 2019-07-27 DIAGNOSIS — E782 Mixed hyperlipidemia: Secondary | ICD-10-CM | POA: Diagnosis not present

## 2019-07-27 DIAGNOSIS — I10 Essential (primary) hypertension: Secondary | ICD-10-CM | POA: Diagnosis not present

## 2019-07-27 DIAGNOSIS — H3530 Unspecified macular degeneration: Secondary | ICD-10-CM | POA: Diagnosis not present

## 2019-07-27 DIAGNOSIS — E1169 Type 2 diabetes mellitus with other specified complication: Secondary | ICD-10-CM | POA: Diagnosis not present

## 2019-07-27 DIAGNOSIS — E1122 Type 2 diabetes mellitus with diabetic chronic kidney disease: Secondary | ICD-10-CM | POA: Diagnosis not present

## 2019-07-27 DIAGNOSIS — Z0001 Encounter for general adult medical examination with abnormal findings: Secondary | ICD-10-CM | POA: Diagnosis not present

## 2019-07-27 DIAGNOSIS — D509 Iron deficiency anemia, unspecified: Secondary | ICD-10-CM | POA: Diagnosis not present

## 2019-07-27 DIAGNOSIS — I129 Hypertensive chronic kidney disease with stage 1 through stage 4 chronic kidney disease, or unspecified chronic kidney disease: Secondary | ICD-10-CM | POA: Diagnosis not present

## 2019-07-27 DIAGNOSIS — N182 Chronic kidney disease, stage 2 (mild): Secondary | ICD-10-CM | POA: Diagnosis not present

## 2019-07-27 DIAGNOSIS — E559 Vitamin D deficiency, unspecified: Secondary | ICD-10-CM | POA: Diagnosis not present

## 2019-07-27 DIAGNOSIS — D530 Protein deficiency anemia: Secondary | ICD-10-CM | POA: Diagnosis not present

## 2019-07-27 DIAGNOSIS — E875 Hyperkalemia: Secondary | ICD-10-CM | POA: Diagnosis not present

## 2019-08-09 NOTE — Progress Notes (Signed)
Patient Care Team: Celene Squibb, MD as PCP - General (Internal Medicine) Rothbart, Cristopher Estimable, MD (Cardiology) Garald Balding, MD (Orthopedic Surgery) Frederik Pear, MD (Orthopedic Surgery) Fanny Skates, MD as Consulting Physician (General Surgery) Nicholas Lose, MD as Consulting Physician (Hematology and Oncology) Kyung Rudd, MD as Consulting Physician (Radiation Oncology) Gardenia Phlegm, NP as Nurse Practitioner (Hematology and Oncology)  DIAGNOSIS:    ICD-10-CM   1. 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 with above-mentioned history of left breast cancer treated with left lumpectomy and who is currently on anti-estrogen therapy with tamoxifen. Mammogram on 04/24/19 showed no evidence of malignancy bilaterally. She presents to the clinic today for annual follow-up.  She has noticed increased lower extremity swelling as result of tamoxifen.  She was placed on Lasix initially at 1 tablet daily but then it was switched to 1 tablet every other day.  This appears to be helping her however she has to go to the bathroom a lot.  She is also  complaining of poor vision and has fallen a few times as a result of that.  She also has generalized lower extremity weakness.  She helps her sisters and cares for them and they care for her.  ALLERGIES:  is allergic to celebrex [celecoxib], claritin [loratadine], penicillins, and percocet [oxycodone-acetaminophen].  MEDICATIONS:  Current Outpatient Medications  Medication Sig Dispense Refill  . acetaminophen (TYLENOL) 325 MG tablet Take 650 mg by mouth every 6 (six) hours as needed.    Marland Kitchen albuterol (PROAIR HFA) 108 (90 BASE) MCG/ACT inhaler Inhale 2 puffs into the lungs every 6 (six) hours as needed for shortness of breath.     Marland Kitchen amLODipine (NORVASC) 10 MG tablet Take 10 mg by mouth daily.      . benazepril (LOTENSIN) 40 MG tablet Take 40 mg by mouth daily.      . carvedilol (COREG) 3.125 MG tablet Take 3.125 mg by mouth 2 (two) times daily. Dose reduction per patient    . cetirizine (ZYRTEC) 10 MG tablet Take 10 mg by mouth daily.     . Cholecalciferol (VITAMIN D PO) Take 1,000 Units by mouth.    . flintstones complete (FLINTSTONES) 60 MG chewable tablet Chew 1 tablet by mouth daily.    Marland Kitchen glipiZIDE (GLUCOTROL XL) 5 MG 24 hr tablet Take 5 mg by mouth every morning.     . metFORMIN (GLUCOPHAGE) 1000 MG tablet Take 1,000 mg by mouth 2 (two) times daily with a meal.    . Multiple Vitamins-Minerals (PRESERVISION AREDS PO) Take 1 tablet by mouth 2 (two) times daily.     Marland Kitchen  pravastatin (PRAVACHOL) 10 MG tablet Take 10 mg by mouth daily.    . tamoxifen (NOLVADEX) 20 MG tablet TAKE 1 TABLET BY MOUTH ONCE A DAY. 90 tablet 0  . traMADol (ULTRAM) 50 MG tablet Take 50 mg by mouth every 6 (six) hours as needed.    . TRICOR 145 MG tablet Take 145 mg by mouth daily.     . Turmeric 450 MG CAPS Take 500 mg by mouth.      No current facility-administered medications for this visit.    PHYSICAL EXAMINATION: ECOG PERFORMANCE STATUS: 1 - Symptomatic but completely ambulatory  Vitals:   08/10/19 0911  BP:  135/69  Pulse: 87  Resp: 17  Temp: 98.9 F (37.2 C)  SpO2: 97%   Filed Weights   08/10/19 0911  Weight: 232 lb 6.4 oz (105.4 kg)    BREAST: No palpable masses or nodules in either right or left breasts. No palpable axillary supraclavicular or infraclavicular adenopathy no breast tenderness or nipple discharge. (exam performed in the presence of a chaperone)  LABORATORY DATA:  I have reviewed the data as listed CMP Latest Ref Rng & Units 09/25/2017 07/03/2012 07/02/2012  Glucose 70 - 99 mg/dL 128(H) 181(H) 172(H)  BUN 8 - 23 mg/dL 20 22 18   Creatinine 0.44 - 1.00 mg/dL 0.86 1.06 0.73  Sodium 135 - 145 mmol/L 142 133(L) 136  Potassium 3.5 - 5.1 mmol/L 4.4 4.4 3.9  Chloride 98 - 111 mmol/L 103 95(L) 99  CO2 22 - 32 mmol/L 29 28 29   Calcium 8.9 - 10.3 mg/dL 9.0 8.9 8.3(L)  Total Protein 6.5 - 8.1 g/dL 6.6 - -  Total Bilirubin 0.3 - 1.2 mg/dL 0.4 - -  Alkaline Phos 38 - 126 U/L 64 - -  AST 15 - 41 U/L 15 - -  ALT 0 - 44 U/L 14 - -    Lab Results  Component Value Date   WBC 5.5 09/25/2017   HGB 13.0 09/25/2017   HCT 40.7 09/25/2017   MCV 83.2 09/25/2017   PLT 285 09/25/2017   NEUTROABS 3.3 09/25/2017    ASSESSMENT & PLAN:  Malignant neoplasm of upper-outer quadrant of left breast in female, estrogen receptor positive ([place]) 08/27/2017:Screening detected left breast mass UOQ at 1:00: 0.4 cm on 05/28/2017, follow-up on 08/13/2017 it was 0.5 cm 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  Tamoxifen toxicities: No hot flashes since she started taking it in  AM Lower extremity swelling: I discussed with her about taking half a tablet of tamoxifen daily but she would like to stay on the full tablet and take Lasix every other day instead.  Breast cancer surveillance:  04/24/2019: Benign breast density category B Breast exam 08/10/2019: Benign Bone density 04/23/2018: Osteopenia, T score -1.6, recommended calcium and vitamin D She exercises  Return to clinic in 1 year for follow-up  No orders of the defined types were placed in this encounter.  The patient has a good understanding of the overall plan. she agrees with it. she will call with any problems that may develop before the next visit  here.  Total time spent: 20 mins including face to face time and time spent for planning, charting and coordination of care  Nicholas Lose, MD 08/10/2019  I, Cloyde Reams Dorshimer, am acting as scribe for Dr. Nicholas Lose.  I have reviewed the above documentation for accuracy and completeness, and I agree with the above.

## 2019-08-10 ENCOUNTER — Inpatient Hospital Stay: Payer: PPO | Attending: Hematology and Oncology | Admitting: Hematology and Oncology

## 2019-08-10 ENCOUNTER — Other Ambulatory Visit: Payer: Self-pay

## 2019-08-10 ENCOUNTER — Telehealth: Payer: Self-pay | Admitting: Hematology and Oncology

## 2019-08-10 DIAGNOSIS — R531 Weakness: Secondary | ICD-10-CM | POA: Diagnosis not present

## 2019-08-10 DIAGNOSIS — Z79810 Long term (current) use of selective estrogen receptor modulators (SERMs): Secondary | ICD-10-CM | POA: Insufficient documentation

## 2019-08-10 DIAGNOSIS — C50412 Malignant neoplasm of upper-outer quadrant of left female breast: Secondary | ICD-10-CM | POA: Insufficient documentation

## 2019-08-10 DIAGNOSIS — Z79899 Other long term (current) drug therapy: Secondary | ICD-10-CM | POA: Insufficient documentation

## 2019-08-10 DIAGNOSIS — R600 Localized edema: Secondary | ICD-10-CM | POA: Diagnosis not present

## 2019-08-10 DIAGNOSIS — Z170 Estrogen receptor positive status [ER+]: Secondary | ICD-10-CM | POA: Insufficient documentation

## 2019-08-10 DIAGNOSIS — Z7984 Long term (current) use of oral hypoglycemic drugs: Secondary | ICD-10-CM | POA: Diagnosis not present

## 2019-08-10 MED ORDER — TAMOXIFEN CITRATE 20 MG PO TABS: 20.0000 mg | ORAL_TABLET | Freq: Every day | ORAL | 3 refills | Status: DC

## 2019-08-10 NOTE — Assessment & Plan Note (Signed)
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  Current treatment plan: Adj antiestrogen therapy withTamoxifen 20 mgdaily x5 years started 10/29/2017.  Tamoxifen toxicities: No hot flashes since you are taking in AM  Breast cancer surveillance:  04/24/2019: Benign breast density category B Breast exam 08/10/2019: Benign Bone density 04/23/2018: Osteopenia, T score -1.6, recommended calcium and vitamin D She exercises  Return to clinic in 1 year for follow-up

## 2019-08-10 NOTE — Telephone Encounter (Signed)
Scheduled appts per 6/28 los. Gave pt a print out of AVS.

## 2019-08-14 DIAGNOSIS — I129 Hypertensive chronic kidney disease with stage 1 through stage 4 chronic kidney disease, or unspecified chronic kidney disease: Secondary | ICD-10-CM | POA: Diagnosis not present

## 2019-08-14 DIAGNOSIS — Z0001 Encounter for general adult medical examination with abnormal findings: Secondary | ICD-10-CM | POA: Diagnosis not present

## 2019-08-14 DIAGNOSIS — D509 Iron deficiency anemia, unspecified: Secondary | ICD-10-CM | POA: Diagnosis not present

## 2019-08-14 DIAGNOSIS — E1169 Type 2 diabetes mellitus with other specified complication: Secondary | ICD-10-CM | POA: Diagnosis not present

## 2019-08-14 DIAGNOSIS — E875 Hyperkalemia: Secondary | ICD-10-CM | POA: Diagnosis not present

## 2019-08-14 DIAGNOSIS — E782 Mixed hyperlipidemia: Secondary | ICD-10-CM | POA: Diagnosis not present

## 2019-08-14 DIAGNOSIS — D530 Protein deficiency anemia: Secondary | ICD-10-CM | POA: Diagnosis not present

## 2019-08-14 DIAGNOSIS — I10 Essential (primary) hypertension: Secondary | ICD-10-CM | POA: Diagnosis not present

## 2019-08-14 DIAGNOSIS — H3530 Unspecified macular degeneration: Secondary | ICD-10-CM | POA: Diagnosis not present

## 2019-08-14 DIAGNOSIS — N182 Chronic kidney disease, stage 2 (mild): Secondary | ICD-10-CM | POA: Diagnosis not present

## 2019-08-14 DIAGNOSIS — E559 Vitamin D deficiency, unspecified: Secondary | ICD-10-CM | POA: Diagnosis not present

## 2019-08-14 DIAGNOSIS — E1122 Type 2 diabetes mellitus with diabetic chronic kidney disease: Secondary | ICD-10-CM | POA: Diagnosis not present

## 2019-09-15 DIAGNOSIS — I10 Essential (primary) hypertension: Secondary | ICD-10-CM | POA: Diagnosis not present

## 2019-09-15 DIAGNOSIS — Z0001 Encounter for general adult medical examination with abnormal findings: Secondary | ICD-10-CM | POA: Diagnosis not present

## 2019-09-15 DIAGNOSIS — I129 Hypertensive chronic kidney disease with stage 1 through stage 4 chronic kidney disease, or unspecified chronic kidney disease: Secondary | ICD-10-CM | POA: Diagnosis not present

## 2019-09-15 DIAGNOSIS — D530 Protein deficiency anemia: Secondary | ICD-10-CM | POA: Diagnosis not present

## 2019-09-15 DIAGNOSIS — H3530 Unspecified macular degeneration: Secondary | ICD-10-CM | POA: Diagnosis not present

## 2019-09-15 DIAGNOSIS — E875 Hyperkalemia: Secondary | ICD-10-CM | POA: Diagnosis not present

## 2019-09-15 DIAGNOSIS — E1169 Type 2 diabetes mellitus with other specified complication: Secondary | ICD-10-CM | POA: Diagnosis not present

## 2019-09-15 DIAGNOSIS — E782 Mixed hyperlipidemia: Secondary | ICD-10-CM | POA: Diagnosis not present

## 2019-09-15 DIAGNOSIS — N182 Chronic kidney disease, stage 2 (mild): Secondary | ICD-10-CM | POA: Diagnosis not present

## 2019-09-15 DIAGNOSIS — E1122 Type 2 diabetes mellitus with diabetic chronic kidney disease: Secondary | ICD-10-CM | POA: Diagnosis not present

## 2019-09-15 DIAGNOSIS — D509 Iron deficiency anemia, unspecified: Secondary | ICD-10-CM | POA: Diagnosis not present

## 2019-09-15 DIAGNOSIS — E559 Vitamin D deficiency, unspecified: Secondary | ICD-10-CM | POA: Diagnosis not present

## 2019-09-21 DIAGNOSIS — H3530 Unspecified macular degeneration: Secondary | ICD-10-CM | POA: Diagnosis not present

## 2019-09-21 DIAGNOSIS — C7981 Secondary malignant neoplasm of breast: Secondary | ICD-10-CM | POA: Diagnosis not present

## 2019-09-21 DIAGNOSIS — D530 Protein deficiency anemia: Secondary | ICD-10-CM | POA: Diagnosis not present

## 2019-09-21 DIAGNOSIS — D509 Iron deficiency anemia, unspecified: Secondary | ICD-10-CM | POA: Diagnosis not present

## 2019-09-21 DIAGNOSIS — E782 Mixed hyperlipidemia: Secondary | ICD-10-CM | POA: Diagnosis not present

## 2019-09-21 DIAGNOSIS — E875 Hyperkalemia: Secondary | ICD-10-CM | POA: Diagnosis not present

## 2019-09-21 DIAGNOSIS — E119 Type 2 diabetes mellitus without complications: Secondary | ICD-10-CM | POA: Diagnosis not present

## 2019-09-21 DIAGNOSIS — D0580 Other specified type of carcinoma in situ of unspecified breast: Secondary | ICD-10-CM | POA: Diagnosis not present

## 2019-09-21 DIAGNOSIS — E1122 Type 2 diabetes mellitus with diabetic chronic kidney disease: Secondary | ICD-10-CM | POA: Diagnosis not present

## 2019-09-21 DIAGNOSIS — E1169 Type 2 diabetes mellitus with other specified complication: Secondary | ICD-10-CM | POA: Diagnosis not present

## 2019-09-21 DIAGNOSIS — E559 Vitamin D deficiency, unspecified: Secondary | ICD-10-CM | POA: Diagnosis not present

## 2019-09-22 ENCOUNTER — Telehealth: Payer: Self-pay | Admitting: *Deleted

## 2019-09-22 NOTE — Telephone Encounter (Signed)
Received call from pt stating she is following her PCP for kidney issues due lasix's.  Pt states she takes one lasix tablet every other day to help with swelling and water retention from Tamoxifen.  Per MD pt to start taking 1/2 tablet of Tamoxifen daily to see if this helps alleviate the need for Lasixs.  Pt verbalized understanding and will update the office.

## 2019-10-15 DIAGNOSIS — J45909 Unspecified asthma, uncomplicated: Secondary | ICD-10-CM | POA: Insufficient documentation

## 2019-10-15 DIAGNOSIS — L900 Lichen sclerosus et atrophicus: Secondary | ICD-10-CM | POA: Insufficient documentation

## 2019-10-15 DIAGNOSIS — M1990 Unspecified osteoarthritis, unspecified site: Secondary | ICD-10-CM | POA: Insufficient documentation

## 2019-10-22 DIAGNOSIS — Z79899 Other long term (current) drug therapy: Secondary | ICD-10-CM | POA: Diagnosis not present

## 2019-10-22 DIAGNOSIS — E1122 Type 2 diabetes mellitus with diabetic chronic kidney disease: Secondary | ICD-10-CM | POA: Diagnosis not present

## 2019-10-22 DIAGNOSIS — I517 Cardiomegaly: Secondary | ICD-10-CM | POA: Diagnosis not present

## 2019-10-22 DIAGNOSIS — E875 Hyperkalemia: Secondary | ICD-10-CM | POA: Diagnosis not present

## 2019-10-22 DIAGNOSIS — E6609 Other obesity due to excess calories: Secondary | ICD-10-CM | POA: Diagnosis not present

## 2019-10-22 DIAGNOSIS — N189 Chronic kidney disease, unspecified: Secondary | ICD-10-CM | POA: Diagnosis not present

## 2019-10-22 DIAGNOSIS — I129 Hypertensive chronic kidney disease with stage 1 through stage 4 chronic kidney disease, or unspecified chronic kidney disease: Secondary | ICD-10-CM | POA: Diagnosis not present

## 2019-10-28 DIAGNOSIS — I10 Essential (primary) hypertension: Secondary | ICD-10-CM | POA: Diagnosis not present

## 2019-10-28 DIAGNOSIS — E162 Hypoglycemia, unspecified: Secondary | ICD-10-CM | POA: Diagnosis not present

## 2019-10-28 DIAGNOSIS — E1122 Type 2 diabetes mellitus with diabetic chronic kidney disease: Secondary | ICD-10-CM | POA: Diagnosis not present

## 2019-10-28 DIAGNOSIS — N1832 Chronic kidney disease, stage 3b: Secondary | ICD-10-CM | POA: Diagnosis not present

## 2019-11-04 DIAGNOSIS — E1122 Type 2 diabetes mellitus with diabetic chronic kidney disease: Secondary | ICD-10-CM | POA: Diagnosis not present

## 2019-11-04 DIAGNOSIS — N189 Chronic kidney disease, unspecified: Secondary | ICD-10-CM | POA: Diagnosis not present

## 2019-11-04 DIAGNOSIS — Z79899 Other long term (current) drug therapy: Secondary | ICD-10-CM | POA: Diagnosis not present

## 2019-11-04 DIAGNOSIS — I129 Hypertensive chronic kidney disease with stage 1 through stage 4 chronic kidney disease, or unspecified chronic kidney disease: Secondary | ICD-10-CM | POA: Diagnosis not present

## 2019-11-04 DIAGNOSIS — E875 Hyperkalemia: Secondary | ICD-10-CM | POA: Diagnosis not present

## 2019-11-04 DIAGNOSIS — I517 Cardiomegaly: Secondary | ICD-10-CM | POA: Diagnosis not present

## 2019-11-10 DIAGNOSIS — R600 Localized edema: Secondary | ICD-10-CM | POA: Diagnosis not present

## 2019-11-10 DIAGNOSIS — M25519 Pain in unspecified shoulder: Secondary | ICD-10-CM | POA: Diagnosis not present

## 2019-11-10 DIAGNOSIS — M545 Low back pain: Secondary | ICD-10-CM | POA: Diagnosis not present

## 2019-11-10 DIAGNOSIS — M159 Polyosteoarthritis, unspecified: Secondary | ICD-10-CM | POA: Diagnosis not present

## 2019-11-10 DIAGNOSIS — M25559 Pain in unspecified hip: Secondary | ICD-10-CM | POA: Diagnosis not present

## 2019-11-10 DIAGNOSIS — H3530 Unspecified macular degeneration: Secondary | ICD-10-CM | POA: Diagnosis not present

## 2019-11-10 DIAGNOSIS — E162 Hypoglycemia, unspecified: Secondary | ICD-10-CM | POA: Diagnosis not present

## 2019-11-10 DIAGNOSIS — Z6841 Body Mass Index (BMI) 40.0 and over, adult: Secondary | ICD-10-CM | POA: Diagnosis not present

## 2019-11-10 DIAGNOSIS — H9313 Tinnitus, bilateral: Secondary | ICD-10-CM | POA: Diagnosis not present

## 2019-11-10 DIAGNOSIS — E875 Hyperkalemia: Secondary | ICD-10-CM | POA: Diagnosis not present

## 2019-11-10 DIAGNOSIS — H9319 Tinnitus, unspecified ear: Secondary | ICD-10-CM | POA: Diagnosis not present

## 2019-11-12 DIAGNOSIS — E782 Mixed hyperlipidemia: Secondary | ICD-10-CM | POA: Diagnosis not present

## 2019-11-12 DIAGNOSIS — Z23 Encounter for immunization: Secondary | ICD-10-CM | POA: Diagnosis not present

## 2019-11-12 DIAGNOSIS — M25512 Pain in left shoulder: Secondary | ICD-10-CM | POA: Diagnosis not present

## 2019-11-12 DIAGNOSIS — D530 Protein deficiency anemia: Secondary | ICD-10-CM | POA: Diagnosis not present

## 2019-11-12 DIAGNOSIS — E875 Hyperkalemia: Secondary | ICD-10-CM | POA: Diagnosis not present

## 2019-11-12 DIAGNOSIS — E1169 Type 2 diabetes mellitus with other specified complication: Secondary | ICD-10-CM | POA: Diagnosis not present

## 2019-11-12 DIAGNOSIS — I10 Essential (primary) hypertension: Secondary | ICD-10-CM | POA: Diagnosis not present

## 2019-11-12 DIAGNOSIS — N182 Chronic kidney disease, stage 2 (mild): Secondary | ICD-10-CM | POA: Diagnosis not present

## 2019-11-12 DIAGNOSIS — D509 Iron deficiency anemia, unspecified: Secondary | ICD-10-CM | POA: Diagnosis not present

## 2019-11-12 DIAGNOSIS — H3530 Unspecified macular degeneration: Secondary | ICD-10-CM | POA: Diagnosis not present

## 2019-11-12 DIAGNOSIS — Z853 Personal history of malignant neoplasm of breast: Secondary | ICD-10-CM | POA: Diagnosis not present

## 2019-11-12 DIAGNOSIS — E559 Vitamin D deficiency, unspecified: Secondary | ICD-10-CM | POA: Diagnosis not present

## 2019-11-19 DIAGNOSIS — E870 Hyperosmolality and hypernatremia: Secondary | ICD-10-CM | POA: Diagnosis not present

## 2019-11-19 DIAGNOSIS — E559 Vitamin D deficiency, unspecified: Secondary | ICD-10-CM | POA: Diagnosis not present

## 2019-11-19 DIAGNOSIS — E538 Deficiency of other specified B group vitamins: Secondary | ICD-10-CM | POA: Diagnosis not present

## 2019-11-19 DIAGNOSIS — N189 Chronic kidney disease, unspecified: Secondary | ICD-10-CM | POA: Diagnosis not present

## 2019-11-19 DIAGNOSIS — E1122 Type 2 diabetes mellitus with diabetic chronic kidney disease: Secondary | ICD-10-CM | POA: Diagnosis not present

## 2019-11-19 DIAGNOSIS — E611 Iron deficiency: Secondary | ICD-10-CM | POA: Diagnosis not present

## 2019-11-19 DIAGNOSIS — I129 Hypertensive chronic kidney disease with stage 1 through stage 4 chronic kidney disease, or unspecified chronic kidney disease: Secondary | ICD-10-CM | POA: Diagnosis not present

## 2019-11-19 DIAGNOSIS — E875 Hyperkalemia: Secondary | ICD-10-CM | POA: Diagnosis not present

## 2019-11-23 DIAGNOSIS — D519 Vitamin B12 deficiency anemia, unspecified: Secondary | ICD-10-CM | POA: Diagnosis not present

## 2019-12-23 DIAGNOSIS — D519 Vitamin B12 deficiency anemia, unspecified: Secondary | ICD-10-CM | POA: Diagnosis not present

## 2020-01-18 DIAGNOSIS — D519 Vitamin B12 deficiency anemia, unspecified: Secondary | ICD-10-CM | POA: Diagnosis not present

## 2020-01-20 DIAGNOSIS — E538 Deficiency of other specified B group vitamins: Secondary | ICD-10-CM | POA: Diagnosis not present

## 2020-01-20 DIAGNOSIS — E1122 Type 2 diabetes mellitus with diabetic chronic kidney disease: Secondary | ICD-10-CM | POA: Diagnosis not present

## 2020-01-20 DIAGNOSIS — I129 Hypertensive chronic kidney disease with stage 1 through stage 4 chronic kidney disease, or unspecified chronic kidney disease: Secondary | ICD-10-CM | POA: Diagnosis not present

## 2020-01-20 DIAGNOSIS — E870 Hyperosmolality and hypernatremia: Secondary | ICD-10-CM | POA: Diagnosis not present

## 2020-01-20 DIAGNOSIS — E611 Iron deficiency: Secondary | ICD-10-CM | POA: Diagnosis not present

## 2020-01-20 DIAGNOSIS — E875 Hyperkalemia: Secondary | ICD-10-CM | POA: Diagnosis not present

## 2020-01-20 DIAGNOSIS — N189 Chronic kidney disease, unspecified: Secondary | ICD-10-CM | POA: Diagnosis not present

## 2020-01-27 DIAGNOSIS — E611 Iron deficiency: Secondary | ICD-10-CM | POA: Diagnosis not present

## 2020-01-27 DIAGNOSIS — E1122 Type 2 diabetes mellitus with diabetic chronic kidney disease: Secondary | ICD-10-CM | POA: Diagnosis not present

## 2020-01-27 DIAGNOSIS — I129 Hypertensive chronic kidney disease with stage 1 through stage 4 chronic kidney disease, or unspecified chronic kidney disease: Secondary | ICD-10-CM | POA: Diagnosis not present

## 2020-01-27 DIAGNOSIS — N189 Chronic kidney disease, unspecified: Secondary | ICD-10-CM | POA: Diagnosis not present

## 2020-01-27 DIAGNOSIS — E875 Hyperkalemia: Secondary | ICD-10-CM | POA: Diagnosis not present

## 2020-01-27 DIAGNOSIS — I517 Cardiomegaly: Secondary | ICD-10-CM | POA: Diagnosis not present

## 2020-02-17 DIAGNOSIS — H3530 Unspecified macular degeneration: Secondary | ICD-10-CM | POA: Diagnosis not present

## 2020-02-17 DIAGNOSIS — H9319 Tinnitus, unspecified ear: Secondary | ICD-10-CM | POA: Diagnosis not present

## 2020-02-17 DIAGNOSIS — M25519 Pain in unspecified shoulder: Secondary | ICD-10-CM | POA: Diagnosis not present

## 2020-02-17 DIAGNOSIS — R600 Localized edema: Secondary | ICD-10-CM | POA: Diagnosis not present

## 2020-02-17 DIAGNOSIS — E875 Hyperkalemia: Secondary | ICD-10-CM | POA: Diagnosis not present

## 2020-02-17 DIAGNOSIS — Z6841 Body Mass Index (BMI) 40.0 and over, adult: Secondary | ICD-10-CM | POA: Diagnosis not present

## 2020-02-17 DIAGNOSIS — E162 Hypoglycemia, unspecified: Secondary | ICD-10-CM | POA: Diagnosis not present

## 2020-02-17 DIAGNOSIS — M159 Polyosteoarthritis, unspecified: Secondary | ICD-10-CM | POA: Diagnosis not present

## 2020-02-17 DIAGNOSIS — M25559 Pain in unspecified hip: Secondary | ICD-10-CM | POA: Diagnosis not present

## 2020-02-17 DIAGNOSIS — I129 Hypertensive chronic kidney disease with stage 1 through stage 4 chronic kidney disease, or unspecified chronic kidney disease: Secondary | ICD-10-CM | POA: Diagnosis not present

## 2020-02-17 DIAGNOSIS — H9313 Tinnitus, bilateral: Secondary | ICD-10-CM | POA: Diagnosis not present

## 2020-02-23 ENCOUNTER — Other Ambulatory Visit: Payer: Self-pay

## 2020-02-23 ENCOUNTER — Other Ambulatory Visit: Payer: PPO

## 2020-02-23 DIAGNOSIS — Z20822 Contact with and (suspected) exposure to covid-19: Secondary | ICD-10-CM | POA: Diagnosis not present

## 2020-02-23 DIAGNOSIS — Z853 Personal history of malignant neoplasm of breast: Secondary | ICD-10-CM | POA: Diagnosis not present

## 2020-02-23 DIAGNOSIS — H3530 Unspecified macular degeneration: Secondary | ICD-10-CM | POA: Diagnosis not present

## 2020-02-23 DIAGNOSIS — M19012 Primary osteoarthritis, left shoulder: Secondary | ICD-10-CM | POA: Diagnosis not present

## 2020-02-23 DIAGNOSIS — N182 Chronic kidney disease, stage 2 (mild): Secondary | ICD-10-CM | POA: Diagnosis not present

## 2020-02-23 DIAGNOSIS — D509 Iron deficiency anemia, unspecified: Secondary | ICD-10-CM | POA: Diagnosis not present

## 2020-02-23 DIAGNOSIS — E875 Hyperkalemia: Secondary | ICD-10-CM | POA: Diagnosis not present

## 2020-02-23 DIAGNOSIS — E559 Vitamin D deficiency, unspecified: Secondary | ICD-10-CM | POA: Diagnosis not present

## 2020-02-23 DIAGNOSIS — M25512 Pain in left shoulder: Secondary | ICD-10-CM | POA: Diagnosis not present

## 2020-02-23 DIAGNOSIS — I10 Essential (primary) hypertension: Secondary | ICD-10-CM | POA: Diagnosis not present

## 2020-02-23 DIAGNOSIS — D530 Protein deficiency anemia: Secondary | ICD-10-CM | POA: Diagnosis not present

## 2020-02-23 DIAGNOSIS — E1169 Type 2 diabetes mellitus with other specified complication: Secondary | ICD-10-CM | POA: Diagnosis not present

## 2020-02-23 DIAGNOSIS — E782 Mixed hyperlipidemia: Secondary | ICD-10-CM | POA: Diagnosis not present

## 2020-02-24 LAB — NOVEL CORONAVIRUS, NAA: SARS-CoV-2, NAA: DETECTED — AB

## 2020-02-24 LAB — SARS-COV-2, NAA 2 DAY TAT

## 2020-02-24 LAB — SPECIMEN STATUS REPORT

## 2020-02-26 ENCOUNTER — Telehealth: Payer: Self-pay

## 2020-02-26 NOTE — Telephone Encounter (Signed)
Called to discuss with patient about COVID-19 symptoms and the use of one of the available treatments for those with mild to moderate Covid symptoms and at a high risk of hospitalization.  Pt appears to qualify for outpatient treatment due to co-morbid conditions and/or a member of an at-risk group in accordance with the FDA Emergency Use Authorization.    Symptom onset: 02/12/20 Vaccinated: No Booster? No Immunocompromised? No Qualifiers: Asthma  Unable to reach pt - Reached pt. Out of window for infusion treatment.   Shelly [name]

## 2020-03-12 DIAGNOSIS — D519 Vitamin B12 deficiency anemia, unspecified: Secondary | ICD-10-CM | POA: Diagnosis not present

## 2020-03-12 DIAGNOSIS — E875 Hyperkalemia: Secondary | ICD-10-CM | POA: Diagnosis not present

## 2020-03-12 DIAGNOSIS — E1122 Type 2 diabetes mellitus with diabetic chronic kidney disease: Secondary | ICD-10-CM | POA: Diagnosis not present

## 2020-03-12 DIAGNOSIS — I10 Essential (primary) hypertension: Secondary | ICD-10-CM | POA: Diagnosis not present

## 2020-03-12 DIAGNOSIS — M542 Cervicalgia: Secondary | ICD-10-CM | POA: Diagnosis not present

## 2020-03-12 DIAGNOSIS — E782 Mixed hyperlipidemia: Secondary | ICD-10-CM | POA: Diagnosis not present

## 2020-04-19 DIAGNOSIS — E611 Iron deficiency: Secondary | ICD-10-CM | POA: Diagnosis not present

## 2020-04-19 DIAGNOSIS — E875 Hyperkalemia: Secondary | ICD-10-CM | POA: Diagnosis not present

## 2020-04-19 DIAGNOSIS — N189 Chronic kidney disease, unspecified: Secondary | ICD-10-CM | POA: Diagnosis not present

## 2020-04-19 DIAGNOSIS — I129 Hypertensive chronic kidney disease with stage 1 through stage 4 chronic kidney disease, or unspecified chronic kidney disease: Secondary | ICD-10-CM | POA: Diagnosis not present

## 2020-04-19 DIAGNOSIS — E1122 Type 2 diabetes mellitus with diabetic chronic kidney disease: Secondary | ICD-10-CM | POA: Diagnosis not present

## 2020-04-19 DIAGNOSIS — I517 Cardiomegaly: Secondary | ICD-10-CM | POA: Diagnosis not present

## 2020-04-21 DIAGNOSIS — M79676 Pain in unspecified toe(s): Secondary | ICD-10-CM | POA: Diagnosis not present

## 2020-04-21 DIAGNOSIS — E1142 Type 2 diabetes mellitus with diabetic polyneuropathy: Secondary | ICD-10-CM | POA: Diagnosis not present

## 2020-04-21 DIAGNOSIS — B351 Tinea unguium: Secondary | ICD-10-CM | POA: Diagnosis not present

## 2020-04-21 DIAGNOSIS — L84 Corns and callosities: Secondary | ICD-10-CM | POA: Diagnosis not present

## 2020-04-27 DIAGNOSIS — E1122 Type 2 diabetes mellitus with diabetic chronic kidney disease: Secondary | ICD-10-CM | POA: Diagnosis not present

## 2020-04-27 DIAGNOSIS — I129 Hypertensive chronic kidney disease with stage 1 through stage 4 chronic kidney disease, or unspecified chronic kidney disease: Secondary | ICD-10-CM | POA: Diagnosis not present

## 2020-04-27 DIAGNOSIS — E6609 Other obesity due to excess calories: Secondary | ICD-10-CM | POA: Diagnosis not present

## 2020-04-27 DIAGNOSIS — E611 Iron deficiency: Secondary | ICD-10-CM | POA: Diagnosis not present

## 2020-04-27 DIAGNOSIS — N189 Chronic kidney disease, unspecified: Secondary | ICD-10-CM | POA: Diagnosis not present

## 2020-05-03 IMAGING — MG MM PLC BREAST LOC DEV 1ST LESION INC MAMMO GUIDE*L*
6 series · 6 of 6 positions shown · non-contrast
Comparison: Previous exam(s).

CLINICAL DATA: Patient for preoperative localization prior to left
breast lumpectomy.

EXAM:
MAMMOGRAPHIC GUIDED RADIOACTIVE SEED LOCALIZATION OF THE LEFT BREAST

[L CC (1 of 4)]
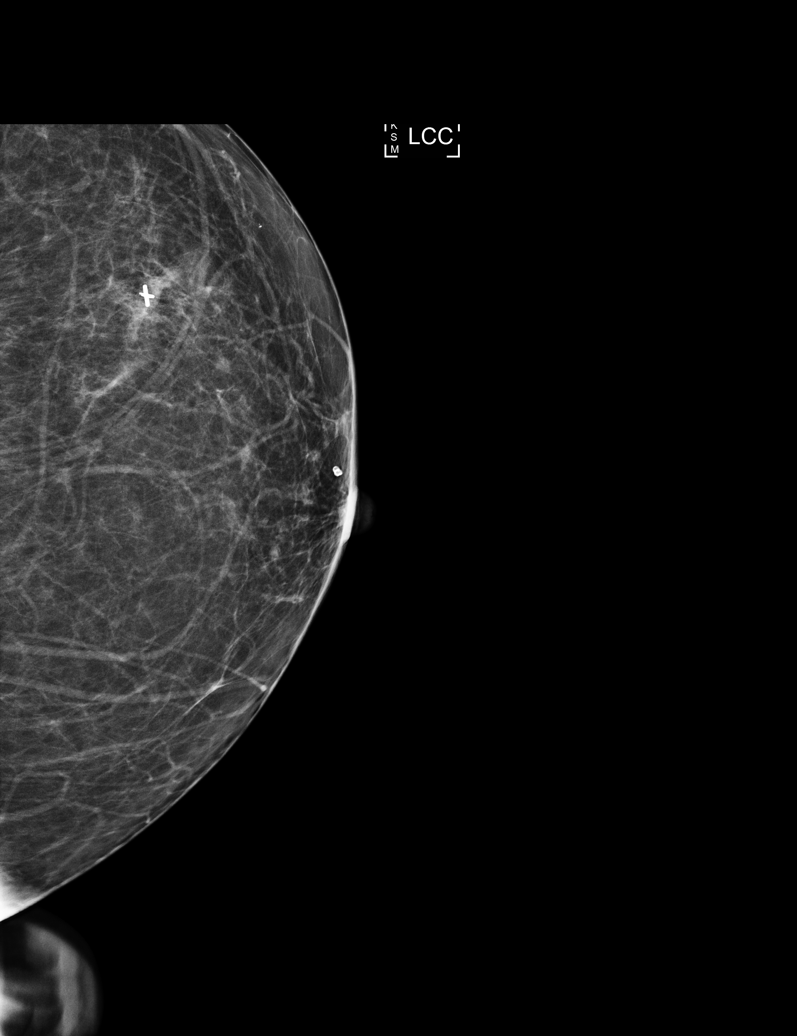

[L LM]
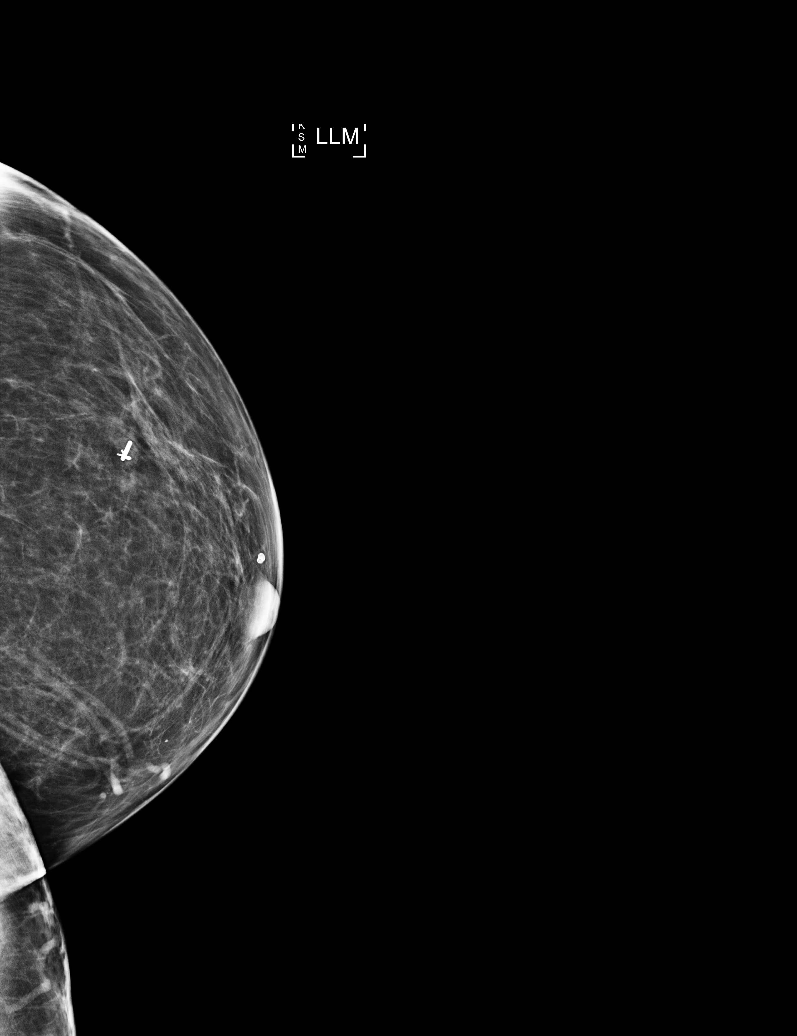

[L CC (2 of 4)]
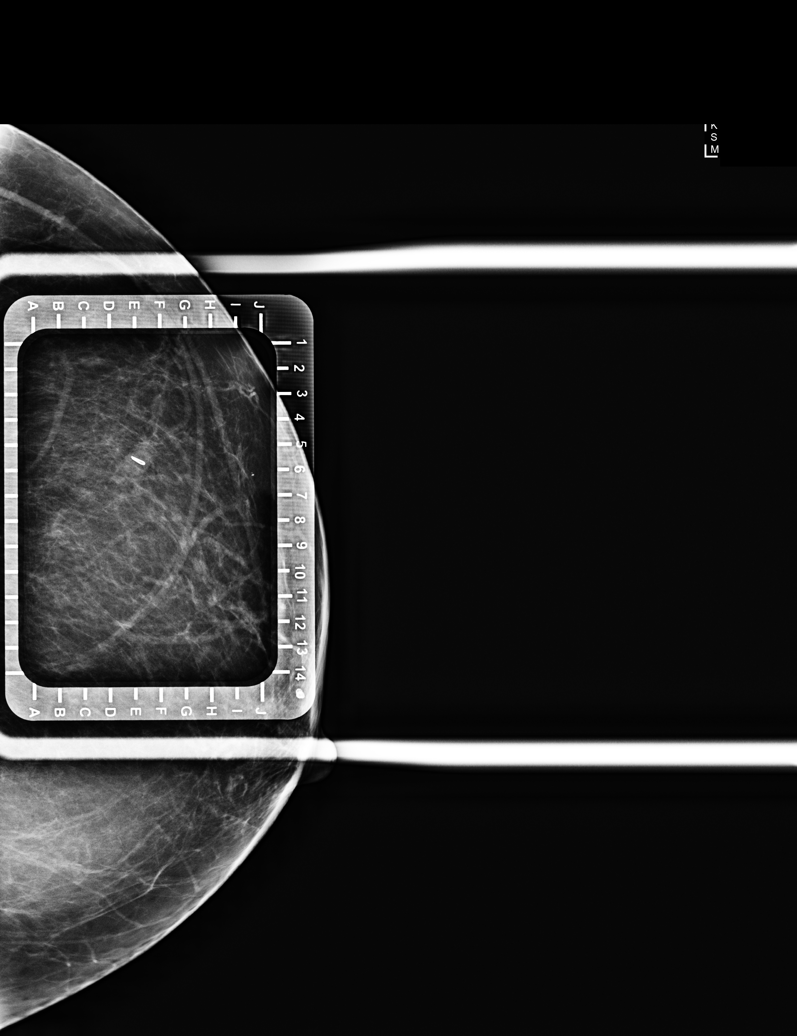

[L CC (3 of 4)]
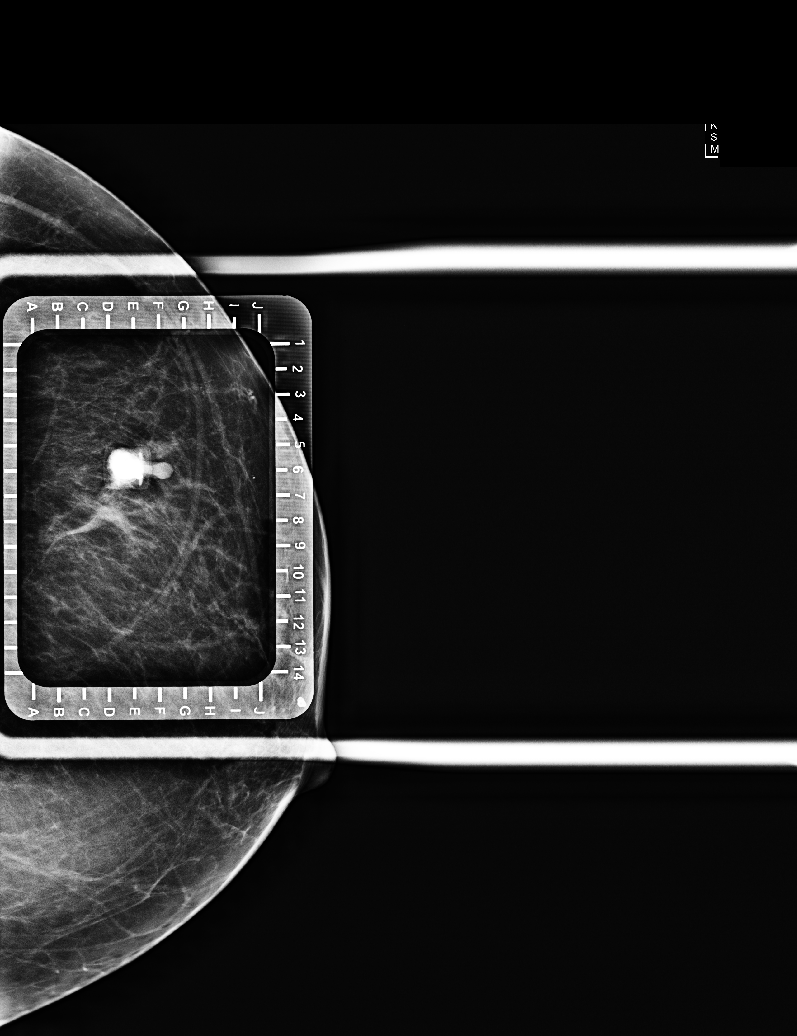

[L ML synth-2D]
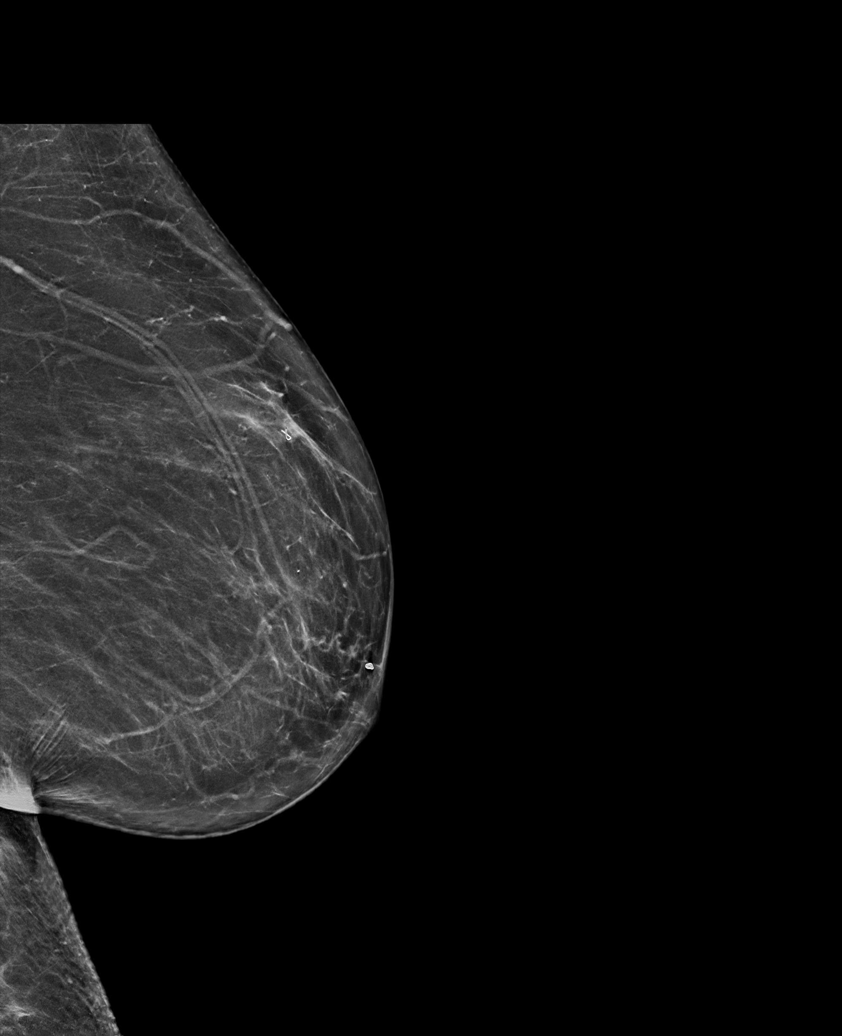

[L CC (4 of 4)]
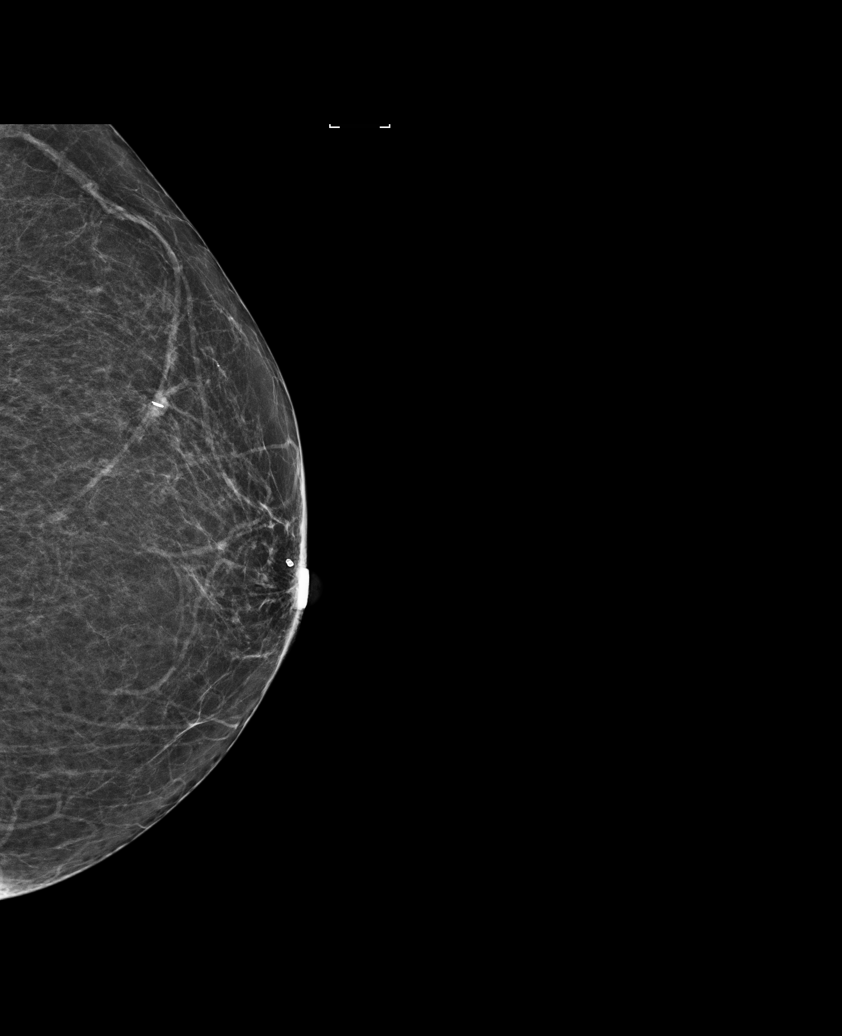

[6 of 6 positions shown; findings below may reference images not displayed]

FINDINGS: Patient presents for radioactive seed localization prior to left
breast lumpectomy. I met with the patient and we discussed the
procedure of seed localization including benefits and alternatives.
We discussed the high likelihood of a successful procedure. We
discussed the risks of the procedure including infection, bleeding,
tissue injury and further surgery. We discussed the low dose of
radioactivity involved in the procedure. Informed, written consent
was given.

The usual time-out protocol was performed immediately prior to the
procedure.

Using mammographic guidance, sterile technique, 1% lidocaine and an
V-[id number] radioactive seed, ribbon shaped marking clip and mass was
localized using a cranial approach. The follow-up mammogram images
confirm the seed in the expected location and were marked for Dr.
Pin Code.

Follow-up survey of the patient confirms presence of the radioactive
seed.

Order number of V-V6J seed:  [id number].

Total activity:  0.252 millicuries reference Date: 09/27/2017

The patient tolerated the procedure well and was released from the
[REDACTED]. She was given instructions regarding seed removal.
IMPRESSION: Radioactive seed localization left breast. No apparent
complications.

## 2020-05-04 ENCOUNTER — Ambulatory Visit (INDEPENDENT_AMBULATORY_CARE_PROVIDER_SITE_OTHER): Payer: PPO

## 2020-05-04 ENCOUNTER — Other Ambulatory Visit: Payer: Self-pay

## 2020-05-04 ENCOUNTER — Ambulatory Visit
Admission: EM | Admit: 2020-05-04 | Discharge: 2020-05-04 | Disposition: A | Discharge status: Home / Self Care | Payer: PPO | Attending: Emergency Medicine | Admitting: Emergency Medicine

## 2020-05-04 DIAGNOSIS — S5012XA Contusion of left forearm, initial encounter: Secondary | ICD-10-CM | POA: Diagnosis not present

## 2020-05-04 DIAGNOSIS — M79602 Pain in left arm: Secondary | ICD-10-CM | POA: Diagnosis not present

## 2020-05-04 DIAGNOSIS — M79632 Pain in left forearm: Secondary | ICD-10-CM

## 2020-05-04 DIAGNOSIS — S4992XA Unspecified injury of left shoulder and upper arm, initial encounter: Secondary | ICD-10-CM

## 2020-05-04 DIAGNOSIS — M25522 Pain in left elbow: Secondary | ICD-10-CM

## 2020-05-04 NOTE — ED Triage Notes (Signed)
Pt presents with  Left arm injury  From fall into door , bruising to left forearm

## 2020-05-04 NOTE — ED Provider Notes (Signed)
[facility]   [id number] 05/04/20 Arrival Time: 4098  CC: LT elbow PAIN  SUBJECTIVE: History from: patient. Shelly [name] is a 80 y.o. female complains of LT arm/ elbow pain and injury x few days.  Fall into door frame.  Localizes the pain to the left forearm/elbow.  Describes the pain as intermittent and 7/10.  Has tried OTC medications without relief.  Symptoms are made worse to the touch.  Denies similar symptoms in the past.  Complains of associated swelling and ecchymosis.  Denies fever, chills, erythema, weakness, numbness and tingling.  ROS: As per HPI.  All other pertinent ROS negative.     Past Medical History:  Diagnosis Date  . Arthritis    "qwhere" (07/01/2012)  . Asthma    normal chest x-ray in 2010  . Basal cell carcinoma of face   . Cholelithiasis   . Chronic lower back pain   . Degenerative joint disease    Right TKA-2010; low back pain; hands and hips also affected  . Exertional shortness of breath    "mostly from being too fat" (07/01/2012)  . GERD (gastroesophageal reflux disease)    history  . H/O hiatal hernia   . Heart murmur    small  . Hemorrhoid   . HTN (hypertension)   . Hyperlipidemia   . Macular degeneration    "both eyes" (07/01/2012)  . PUD (peptic ulcer disease) 2003   Upper GI bleed-gastric ulcer; gastroesophageal reflux disease  . Seasonal allergies   . Type II diabetes mellitus ([place])    Past Surgical History:  Procedure Laterality Date  . APPENDECTOMY    . BREAST LUMPECTOMY Left   . BREAST LUMPECTOMY WITH RADIOACTIVE SEED LOCALIZATION Left 10/21/2017   Procedure: BREAST LUMPECTOMY WITH RADIOACTIVE SEED LOCALIZATION;  Surgeon: Fanny Skates, MD;  Location: [facility];  Service: General;  Laterality: Left;  . CATARACT EXTRACTION BILATERAL W/ ANTERIOR VITRECTOMY Bilateral   . EYE SURGERY Bilateral    "laser in eyes to stop bleeding and injections for macular degeneration" (07/01/2012)  . FOOT SURGERY Bilateral     straighten 1st toe with a wedge.  Marland Kitchen KNEE ARTHROSCOPY Bilateral   . SKIN CANCER EXCISION  2013   "face" (07/01/2012)  . TOTAL KNEE ARTHROPLASTY Right 2011  . TOTAL KNEE ARTHROPLASTY Left 07/01/2012  . TOTAL KNEE ARTHROPLASTY Left 07/01/2012   Procedure: LEFT TOTAL KNEE ARTHROPLASTY;  Surgeon: Garald Balding, MD;  Location: [facility];  Service: Orthopedics;  Laterality: Left;  Left Total Knee Arthroplasty   Allergies  Allergen Reactions  . Celebrex [Celecoxib] Other (See Comments)    Abdominal pain; history of upper GI bleed   . Claritin [Loratadine] Hives and Swelling    blisters  . Penicillins Hives  . Percocet [Oxycodone-Acetaminophen] Other (See Comments)    Severe constipation   No current facility-administered medications on file prior to encounter.   Current Outpatient Medications on File Prior to Encounter  Medication Sig Dispense Refill  . acetaminophen (TYLENOL) 325 MG tablet Take 650 mg by mouth every 6 (six) hours as needed.    Marland Kitchen albuterol (PROAIR HFA) 108 (90 BASE) MCG/ACT inhaler Inhale 2 puffs into the lungs every 6 (six) hours as needed for shortness of breath.     Marland Kitchen amLODipine (NORVASC) 10 MG tablet Take 10 mg by mouth daily.      . benazepril (LOTENSIN) 40 MG tablet Take 40 mg by mouth daily.      . carvedilol (COREG) 3.125 MG tablet  Take 3.125 mg by mouth 2 (two) times daily. Dose reduction per patient    . cetirizine (ZYRTEC) 10 MG tablet Take 10 mg by mouth daily.     . Cholecalciferol (VITAMIN D PO) Take 1,000 Units by mouth.    . flintstones complete (FLINTSTONES) 60 MG chewable tablet Chew 1 tablet by mouth daily.    Marland Kitchen glipiZIDE (GLUCOTROL XL) 5 MG 24 hr tablet Take 5 mg by mouth every morning.     . metFORMIN (GLUCOPHAGE) 1000 MG tablet Take 1,000 mg by mouth 2 (two) times daily with a meal.    . Multiple Vitamins-Minerals (PRESERVISION AREDS PO) Take 1 tablet by mouth 2 (two) times daily.     . pravastatin (PRAVACHOL) 10 MG tablet Take 10 mg by mouth daily.     . tamoxifen (NOLVADEX) 20 MG tablet Take 1 tablet (20 mg total) by mouth daily. 90 tablet 3  . traMADol (ULTRAM) 50 MG tablet Take 50 mg by mouth every 6 (six) hours as needed.    . TRICOR 145 MG tablet Take 145 mg by mouth daily.     . Turmeric 450 MG CAPS Take 500 mg by mouth.      Social History   Socioeconomic History  . Marital status: Divorced    Spouse name: Not on file  . Number of children: 2  . Years of education: Not on file  . Highest education level: Not on file  Occupational History  . Not on file  Tobacco Use  . Smoking status: Former Smoker    Packs/day: 2.50    Years: 27.00    Pack years: 67.50    Types: Cigarettes    Quit date: 02/12/1981    Years since quitting: 39.2  . Smokeless tobacco: Never Used  Substance and Sexual Activity  . Alcohol use: No  . Drug use: No  . Sexual activity: Not Currently  Other Topics Concern  . Not on file  Social History Narrative   Lives locally. Works part time at the [employer].    Social Determinants of Health   Financial Resource Strain: Not on file  Food Insecurity: Not on file  Transportation Needs: Not on file  Physical Activity: Not on file  Stress: Not on file  Social Connections: Not on file  Intimate Partner Violence: Not on file   Family History  Problem Relation Age of Onset  . Hodgkin's lymphoma Mother   . Cervical cancer Sister        half sister mom's side  . Lung cancer Sister   . Breast cancer Neg Hx     OBJECTIVE:  Vitals:   05/04/20 1806  BP: 136/73  Pulse: 98  Resp: 18  Temp: 99.4 F (37.4 C)  SpO2: 94%    General appearance: ALERT; in no acute distress.  Head: NCAT Lungs: Normal respiratory effort CV: Radial pulse 2+ Musculoskeletal: LT forearm/ elbow Inspection: Swelling and ecchymosis to proximal posterior forearm Palpation: TTP over posterior proximal forearm ROM: FROM active and passive Strength: 5/5 elbow flexion, 5/5 elbow extension, 5/5 grip  strength Skin: warm and dry Neurologic: Ambulates without difficulty; Sensation intact about the upper extremities Psychological: alert and cooperative; normal mood and affect  DIAGNOSTIC STUDIES:  DG Forearm Left  Result Date: 05/04/2020 CLINICAL DATA:  [place].  Pain and bruising. EXAM: LEFT FOREARM - 2 VIEW COMPARISON:  None FINDINGS: The elbow and wrist joints are maintained. Moderate degenerative changes noted at the wrist with chondrocalcinosis. Severe  degenerative changes at the [facility] joint of the thumb. No acute forearm fracture. IMPRESSION: 1. No acute bony findings. 2. Degenerative changes at the wrist and [facility] joint of the thumb. Electronically Signed   By: Marijo Sanes M.D.   On: 05/04/2020 18:28    X-rays negative for bony abnormalities including fracture, or dislocation.  No soft tissue swelling.    I have reviewed the x-rays myself and the radiologist interpretation. I am in agreement with the radiologist interpretation.     ASSESSMENT & PLAN:  1. Left arm pain   2. Left elbow pain   3. Arm injury, left, initial encounter    X-rays negative Continue conservative management of rest, ice, and elevation Tylenol arthritis for pain Follow up with PCP if symptoms persist Return or go to the ER if you have any new or worsening symptoms (fever, chills, chest pain, redness, bruising, etc...)    Reviewed expectations re: course of current medical issues. Questions answered. Outlined signs and symptoms indicating need for more acute intervention. Patient verbalized understanding. Left prior to AVS paper    Lestine Box, PA-C 05/04/20 1901

## 2020-05-04 NOTE — Discharge Instructions (Signed)
X-rays negative Continue conservative management of rest, ice, and elevation Tylenol arthritis for pain Follow up with PCP if symptoms persist Return or go to the ER if you have any new or worsening symptoms (fever, chills, chest pain, redness, bruising, etc...)

## 2020-05-06 IMAGING — MG BREAST SURGICAL SPECIMEN
1 series · 1 of 1 positions shown · non-contrast
Comparison: Previous exam(s).

CLINICAL DATA: Biopsy proven encapsulated papillary carcinoma of
the breast.

EXAM:
SPECIMEN RADIOGRAPH OF THE LEFT BREAST

[L]
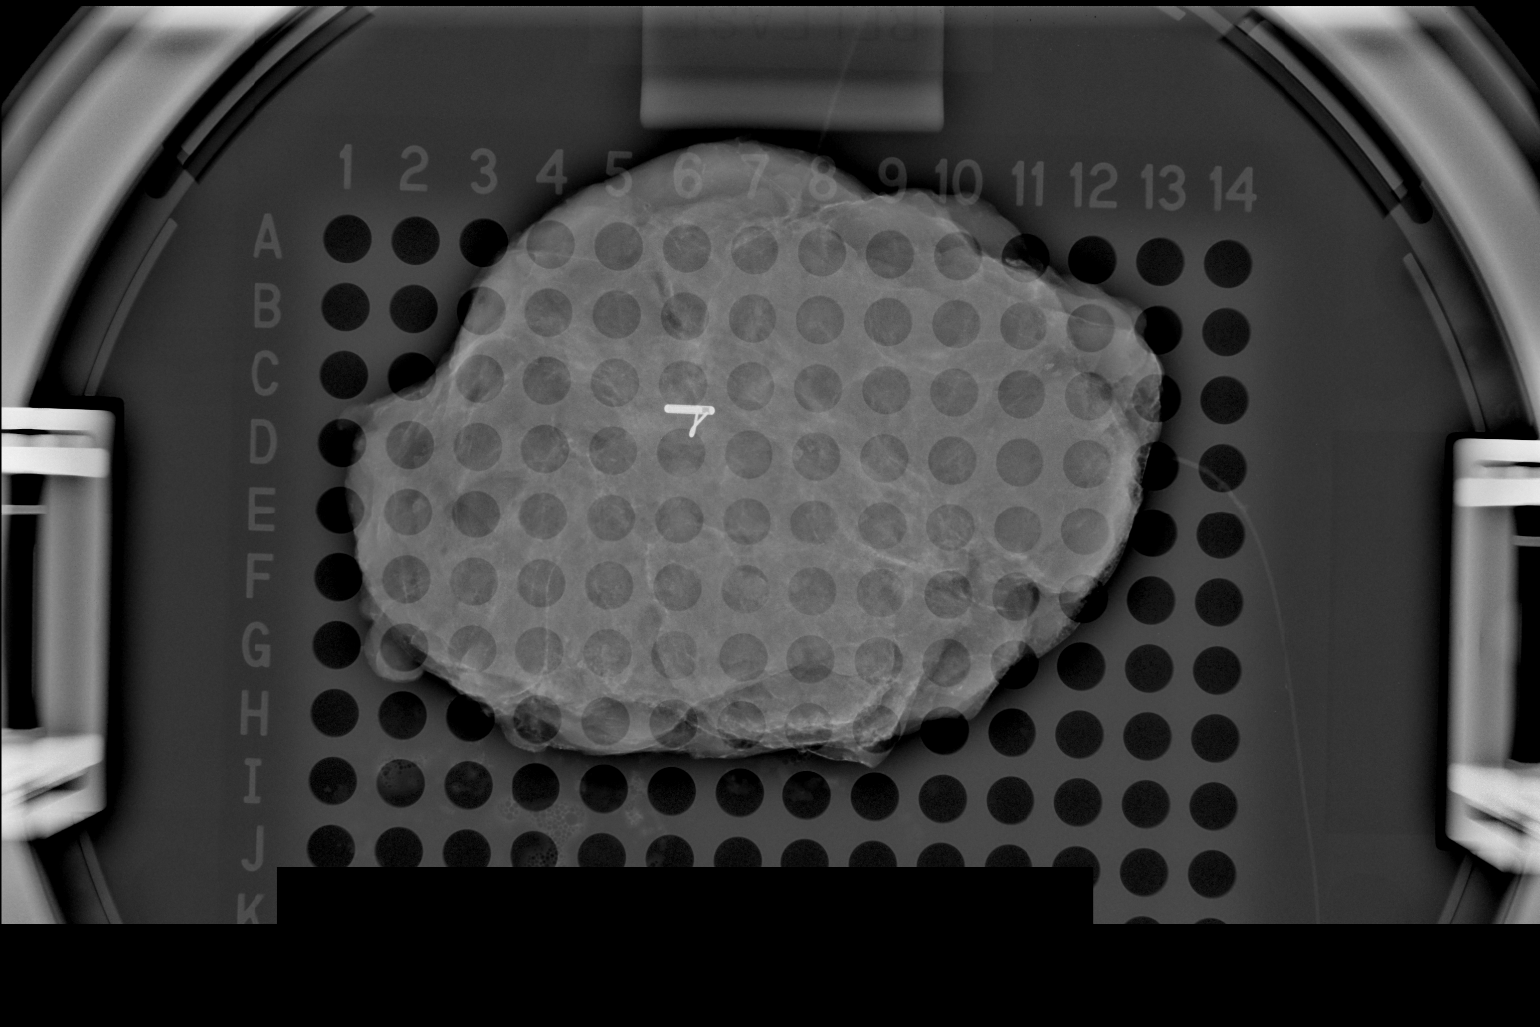

[1 of 1 positions shown; findings below may reference images not displayed]

FINDINGS: Status post excision of the left breast. The radioactive seed and
biopsy marker clip are present, completely intact, and were marked
for pathology.
IMPRESSION: Specimen radiograph of the left breast.

## 2020-06-20 ENCOUNTER — Other Ambulatory Visit (HOSPITAL_COMMUNITY): Payer: Self-pay | Admitting: Internal Medicine

## 2020-06-20 DIAGNOSIS — Z1231 Encounter for screening mammogram for malignant neoplasm of breast: Secondary | ICD-10-CM

## 2020-06-23 DIAGNOSIS — H9313 Tinnitus, bilateral: Secondary | ICD-10-CM | POA: Diagnosis not present

## 2020-06-23 DIAGNOSIS — M25519 Pain in unspecified shoulder: Secondary | ICD-10-CM | POA: Diagnosis not present

## 2020-06-23 DIAGNOSIS — M159 Polyosteoarthritis, unspecified: Secondary | ICD-10-CM | POA: Diagnosis not present

## 2020-06-23 DIAGNOSIS — E875 Hyperkalemia: Secondary | ICD-10-CM | POA: Diagnosis not present

## 2020-06-23 DIAGNOSIS — I129 Hypertensive chronic kidney disease with stage 1 through stage 4 chronic kidney disease, or unspecified chronic kidney disease: Secondary | ICD-10-CM | POA: Diagnosis not present

## 2020-06-23 DIAGNOSIS — Z6841 Body Mass Index (BMI) 40.0 and over, adult: Secondary | ICD-10-CM | POA: Diagnosis not present

## 2020-06-23 DIAGNOSIS — R600 Localized edema: Secondary | ICD-10-CM | POA: Diagnosis not present

## 2020-06-23 DIAGNOSIS — H9319 Tinnitus, unspecified ear: Secondary | ICD-10-CM | POA: Diagnosis not present

## 2020-06-23 DIAGNOSIS — E162 Hypoglycemia, unspecified: Secondary | ICD-10-CM | POA: Diagnosis not present

## 2020-06-23 DIAGNOSIS — H3530 Unspecified macular degeneration: Secondary | ICD-10-CM | POA: Diagnosis not present

## 2020-06-23 DIAGNOSIS — M25559 Pain in unspecified hip: Secondary | ICD-10-CM | POA: Diagnosis not present

## 2020-06-30 DIAGNOSIS — E782 Mixed hyperlipidemia: Secondary | ICD-10-CM | POA: Diagnosis not present

## 2020-06-30 DIAGNOSIS — I10 Essential (primary) hypertension: Secondary | ICD-10-CM | POA: Diagnosis not present

## 2020-06-30 DIAGNOSIS — D509 Iron deficiency anemia, unspecified: Secondary | ICD-10-CM | POA: Diagnosis not present

## 2020-06-30 DIAGNOSIS — E875 Hyperkalemia: Secondary | ICD-10-CM | POA: Diagnosis not present

## 2020-06-30 DIAGNOSIS — E538 Deficiency of other specified B group vitamins: Secondary | ICD-10-CM | POA: Diagnosis not present

## 2020-06-30 DIAGNOSIS — N1831 Chronic kidney disease, stage 3a: Secondary | ICD-10-CM | POA: Diagnosis not present

## 2020-06-30 DIAGNOSIS — E559 Vitamin D deficiency, unspecified: Secondary | ICD-10-CM | POA: Diagnosis not present

## 2020-06-30 DIAGNOSIS — R778 Other specified abnormalities of plasma proteins: Secondary | ICD-10-CM | POA: Diagnosis not present

## 2020-06-30 DIAGNOSIS — E1169 Type 2 diabetes mellitus with other specified complication: Secondary | ICD-10-CM | POA: Diagnosis not present

## 2020-06-30 DIAGNOSIS — C50412 Malignant neoplasm of upper-outer quadrant of left female breast: Secondary | ICD-10-CM | POA: Diagnosis not present

## 2020-06-30 DIAGNOSIS — H3530 Unspecified macular degeneration: Secondary | ICD-10-CM | POA: Diagnosis not present

## 2020-07-03 DIAGNOSIS — R252 Cramp and spasm: Secondary | ICD-10-CM | POA: Insufficient documentation

## 2020-07-03 DIAGNOSIS — R778 Other specified abnormalities of plasma proteins: Secondary | ICD-10-CM | POA: Insufficient documentation

## 2020-07-03 DIAGNOSIS — E559 Vitamin D deficiency, unspecified: Secondary | ICD-10-CM | POA: Insufficient documentation

## 2020-07-03 DIAGNOSIS — M25512 Pain in left shoulder: Secondary | ICD-10-CM | POA: Insufficient documentation

## 2020-07-03 DIAGNOSIS — E875 Hyperkalemia: Secondary | ICD-10-CM | POA: Insufficient documentation

## 2020-07-05 ENCOUNTER — Encounter (INDEPENDENT_AMBULATORY_CARE_PROVIDER_SITE_OTHER): Payer: PPO | Admitting: Ophthalmology

## 2020-07-05 ENCOUNTER — Other Ambulatory Visit: Payer: Self-pay

## 2020-07-05 DIAGNOSIS — H353231 Exudative age-related macular degeneration, bilateral, with active choroidal neovascularization: Secondary | ICD-10-CM

## 2020-07-05 DIAGNOSIS — I10 Essential (primary) hypertension: Secondary | ICD-10-CM

## 2020-07-05 DIAGNOSIS — H43813 Vitreous degeneration, bilateral: Secondary | ICD-10-CM

## 2020-07-05 DIAGNOSIS — H35033 Hypertensive retinopathy, bilateral: Secondary | ICD-10-CM

## 2020-07-08 ENCOUNTER — Encounter (HOSPITAL_COMMUNITY): Payer: Self-pay

## 2020-07-08 ENCOUNTER — Ambulatory Visit (HOSPITAL_COMMUNITY)
Admission: RE | Admit: 2020-07-08 | Discharge: 2020-07-08 | Disposition: A | Discharge status: Home / Self Care | Payer: PPO | Source: Ambulatory Visit | Attending: Internal Medicine | Admitting: Internal Medicine

## 2020-07-08 ENCOUNTER — Other Ambulatory Visit: Payer: Self-pay

## 2020-07-08 DIAGNOSIS — Z1231 Encounter for screening mammogram for malignant neoplasm of breast: Secondary | ICD-10-CM | POA: Insufficient documentation

## 2020-07-20 DIAGNOSIS — E1122 Type 2 diabetes mellitus with diabetic chronic kidney disease: Secondary | ICD-10-CM | POA: Diagnosis not present

## 2020-07-20 DIAGNOSIS — N189 Chronic kidney disease, unspecified: Secondary | ICD-10-CM | POA: Diagnosis not present

## 2020-07-20 DIAGNOSIS — I129 Hypertensive chronic kidney disease with stage 1 through stage 4 chronic kidney disease, or unspecified chronic kidney disease: Secondary | ICD-10-CM | POA: Diagnosis not present

## 2020-07-20 DIAGNOSIS — E6609 Other obesity due to excess calories: Secondary | ICD-10-CM | POA: Diagnosis not present

## 2020-07-20 DIAGNOSIS — E611 Iron deficiency: Secondary | ICD-10-CM | POA: Diagnosis not present

## 2020-08-09 ENCOUNTER — Ambulatory Visit (HOSPITAL_BASED_OUTPATIENT_CLINIC_OR_DEPARTMENT_OTHER): Payer: PPO | Admitting: Hematology and Oncology

## 2020-08-09 DIAGNOSIS — C50412 Malignant neoplasm of upper-outer quadrant of left female breast: Secondary | ICD-10-CM | POA: Diagnosis not present

## 2020-08-09 DIAGNOSIS — Z170 Estrogen receptor positive status [ER+]: Secondary | ICD-10-CM | POA: Diagnosis not present

## 2020-08-09 MED ORDER — MAGNESIUM OXIDE -MG SUPPLEMENT 400 (240 MG) MG PO TABS: 400.0000 mg | ORAL_TABLET | Freq: Every day | ORAL | 3 refills | Status: DC

## 2020-08-09 NOTE — Assessment & Plan Note (Signed)
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  Tamoxifen toxicities: No hot flashes since she started taking it in AM Lower extremity swelling: I discussed with her about taking half a tablet of tamoxifen daily but she would like to stay on the full tablet and take Lasix every other day instead.  Breast cancer surveillance: 04/24/2019: Benign breast density category B Breast exam 08/09/2020: Benign Bone density 04/23/2018: Osteopenia, T score -1.6, recommended calcium and vitamin D She exercises  Return to clinic in 1 year for follow-up

## 2020-08-09 NOTE — Progress Notes (Signed)
HEMATOLOGY-ONCOLOGY TELEPHONE VISIT PROGRESS NOTE  I connected with _0 @ on 08/09/20 at 11:00 AM EDT by telephone and verified that I am speaking with the correct person using two identifiers.  I discussed the limitations, risks, security and privacy concerns of performing an evaluation and management service by telephone and the availability of in person appointments.  I also discussed with the patient that there may be a patient responsible charge related to this service. The patient expressed understanding and agreed to proceed.   History of Present Illness: Follow-up on tamoxifen therapy. 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%, PR 75%, HER-2 negative ratio 1.27 copy #1.9 T1 a N0 stage Ia    10/21/2017 Surgery   10/21/17: Left Lumpectomy: Encapsulated papillary carcinoma 0.8 cm, grade 2, Margins Neg, ER 95%, PR 75%, HER-2 negative ratio 1.27 copy #1.9 T1 a N0 stage Ia    12/2017 -  Anti-estrogen oral therapy   Tamoxifen daily        Assessment Plan:  Malignant neoplasm of upper-outer quadrant of left breast in female, estrogen receptor positive ([place]) 08/27/2017: Screening detected left breast mass UOQ at 1:00: 0.4 cm on 05/28/2017, follow-up on 08/13/2017 it was 0.5 cm biopsy revealed encapsulated papillary cancer ER 95%, PR 75%, HER-2 negative ratio 1.27 copy #1.9 T1 a N0 stage Ia   10/21/17: Left Lumpectomy: Encapsulated papillary carcinoma 0.8 cm, grade 2, Margins Neg, ER 95%, PR 75%, HER-2 negative ratio 1.27 copy #1.9 T1 a N0 stage Ia Did not receive radiation because of her age and good prognosis subtype of breast cancer   Current treatment plan: Adj  antiestrogen therapy with Tamoxifen 20 mg daily x5 years started 10/29/2017.   Tamoxifen toxicities: No hot flashes since she started taking it in AM Joint aches and pains: all joints hurt worse in shoulders: Most likely related to osteoarthritis rather than from tamoxifen.  Lower extremity swelling: lasix helped.   Breast cancer surveillance: 04/24/2019: Benign breast density category B She could not get a mammogram because of transportation and scheduling issues. Bone density 04/23/2018: Osteopenia, T score -1.6, recommended calcium and vitamin D     Return to clinic in 1 year for follow-up   I discussed the assessment and treatment plan with the patient. The patient was provided an opportunity to ask questions and all were answered. The patient agreed with the plan and demonstrated an understanding of the instructions. The patient was advised to call back or seek an in-person evaluation if the symptoms worsen or if the condition fails to improve as anticipated.   I provided 15 minutes of non-face-to-face time during this encounter. Harriette Ohara, MD

## 2020-08-18 ENCOUNTER — Other Ambulatory Visit: Payer: Self-pay

## 2020-08-18 ENCOUNTER — Encounter: Payer: Self-pay | Admitting: Emergency Medicine

## 2020-08-18 ENCOUNTER — Ambulatory Visit
Admission: EM | Admit: 2020-08-18 | Discharge: 2020-08-18 | Disposition: A | Discharge status: Home / Self Care | Payer: PPO

## 2020-08-18 DIAGNOSIS — Z0130 Encounter for examination of blood pressure without abnormal findings: Secondary | ICD-10-CM | POA: Diagnosis not present

## 2020-08-18 NOTE — ED Triage Notes (Signed)
Pt went to donate blood today and was told her bp was too low. Would like blood pressure checked.

## 2020-08-25 DIAGNOSIS — E6609 Other obesity due to excess calories: Secondary | ICD-10-CM | POA: Diagnosis not present

## 2020-08-25 DIAGNOSIS — I517 Cardiomegaly: Secondary | ICD-10-CM | POA: Diagnosis not present

## 2020-08-25 DIAGNOSIS — I129 Hypertensive chronic kidney disease with stage 1 through stage 4 chronic kidney disease, or unspecified chronic kidney disease: Secondary | ICD-10-CM | POA: Diagnosis not present

## 2020-08-25 DIAGNOSIS — N189 Chronic kidney disease, unspecified: Secondary | ICD-10-CM | POA: Diagnosis not present

## 2020-08-25 DIAGNOSIS — E1122 Type 2 diabetes mellitus with diabetic chronic kidney disease: Secondary | ICD-10-CM | POA: Diagnosis not present

## 2020-09-27 DIAGNOSIS — I10 Essential (primary) hypertension: Secondary | ICD-10-CM | POA: Diagnosis not present

## 2020-09-30 DIAGNOSIS — E1169 Type 2 diabetes mellitus with other specified complication: Secondary | ICD-10-CM | POA: Diagnosis not present

## 2020-09-30 DIAGNOSIS — Z0001 Encounter for general adult medical examination with abnormal findings: Secondary | ICD-10-CM | POA: Diagnosis not present

## 2020-09-30 DIAGNOSIS — Z0000 Encounter for general adult medical examination without abnormal findings: Secondary | ICD-10-CM | POA: Insufficient documentation

## 2020-09-30 DIAGNOSIS — M546 Pain in thoracic spine: Secondary | ICD-10-CM | POA: Insufficient documentation

## 2020-09-30 DIAGNOSIS — E559 Vitamin D deficiency, unspecified: Secondary | ICD-10-CM | POA: Diagnosis not present

## 2020-09-30 DIAGNOSIS — E782 Mixed hyperlipidemia: Secondary | ICD-10-CM | POA: Diagnosis not present

## 2020-09-30 DIAGNOSIS — R778 Other specified abnormalities of plasma proteins: Secondary | ICD-10-CM | POA: Diagnosis not present

## 2020-09-30 DIAGNOSIS — H3530 Unspecified macular degeneration: Secondary | ICD-10-CM | POA: Diagnosis not present

## 2020-09-30 DIAGNOSIS — N1831 Chronic kidney disease, stage 3a: Secondary | ICD-10-CM | POA: Diagnosis not present

## 2020-09-30 DIAGNOSIS — E538 Deficiency of other specified B group vitamins: Secondary | ICD-10-CM | POA: Diagnosis not present

## 2020-09-30 DIAGNOSIS — D509 Iron deficiency anemia, unspecified: Secondary | ICD-10-CM | POA: Diagnosis not present

## 2020-09-30 DIAGNOSIS — E875 Hyperkalemia: Secondary | ICD-10-CM | POA: Diagnosis not present

## 2020-09-30 DIAGNOSIS — I10 Essential (primary) hypertension: Secondary | ICD-10-CM | POA: Diagnosis not present

## 2020-11-05 ENCOUNTER — Other Ambulatory Visit: Payer: Self-pay | Admitting: Hematology and Oncology

## 2020-11-14 ENCOUNTER — Other Ambulatory Visit: Payer: Self-pay | Admitting: Adult Health

## 2020-11-14 DIAGNOSIS — Z09 Encounter for follow-up examination after completed treatment for conditions other than malignant neoplasm: Secondary | ICD-10-CM

## 2020-11-30 DIAGNOSIS — E6609 Other obesity due to excess calories: Secondary | ICD-10-CM | POA: Diagnosis not present

## 2020-11-30 DIAGNOSIS — N189 Chronic kidney disease, unspecified: Secondary | ICD-10-CM | POA: Diagnosis not present

## 2020-11-30 DIAGNOSIS — E1122 Type 2 diabetes mellitus with diabetic chronic kidney disease: Secondary | ICD-10-CM | POA: Diagnosis not present

## 2020-11-30 DIAGNOSIS — I129 Hypertensive chronic kidney disease with stage 1 through stage 4 chronic kidney disease, or unspecified chronic kidney disease: Secondary | ICD-10-CM | POA: Diagnosis not present

## 2020-11-30 DIAGNOSIS — I517 Cardiomegaly: Secondary | ICD-10-CM | POA: Diagnosis not present

## 2020-12-08 ENCOUNTER — Other Ambulatory Visit: Payer: Self-pay | Admitting: Adult Health

## 2020-12-08 DIAGNOSIS — N189 Chronic kidney disease, unspecified: Secondary | ICD-10-CM | POA: Diagnosis not present

## 2020-12-08 DIAGNOSIS — Z853 Personal history of malignant neoplasm of breast: Secondary | ICD-10-CM

## 2020-12-08 DIAGNOSIS — I517 Cardiomegaly: Secondary | ICD-10-CM | POA: Diagnosis not present

## 2020-12-08 DIAGNOSIS — N19 Unspecified kidney failure: Secondary | ICD-10-CM | POA: Diagnosis not present

## 2020-12-08 DIAGNOSIS — I129 Hypertensive chronic kidney disease with stage 1 through stage 4 chronic kidney disease, or unspecified chronic kidney disease: Secondary | ICD-10-CM | POA: Diagnosis not present

## 2020-12-08 DIAGNOSIS — E6609 Other obesity due to excess calories: Secondary | ICD-10-CM | POA: Diagnosis not present

## 2020-12-08 DIAGNOSIS — E1122 Type 2 diabetes mellitus with diabetic chronic kidney disease: Secondary | ICD-10-CM | POA: Diagnosis not present

## 2020-12-09 ENCOUNTER — Ambulatory Visit: Payer: PPO

## 2020-12-12 DIAGNOSIS — E785 Hyperlipidemia, unspecified: Secondary | ICD-10-CM | POA: Diagnosis not present

## 2020-12-12 DIAGNOSIS — I10 Essential (primary) hypertension: Secondary | ICD-10-CM | POA: Diagnosis not present

## 2020-12-26 ENCOUNTER — Encounter: Payer: Self-pay | Admitting: Cardiology

## 2020-12-26 ENCOUNTER — Ambulatory Visit: Payer: PPO | Admitting: Cardiology

## 2020-12-26 ENCOUNTER — Other Ambulatory Visit: Payer: Self-pay

## 2020-12-26 VITALS — BP 132/74 | HR 86 | Ht 63.0 in | Wt 232.6 lb

## 2020-12-26 DIAGNOSIS — E782 Mixed hyperlipidemia: Secondary | ICD-10-CM | POA: Diagnosis not present

## 2020-12-26 DIAGNOSIS — R0609 Other forms of dyspnea: Secondary | ICD-10-CM | POA: Diagnosis not present

## 2020-12-26 DIAGNOSIS — E119 Type 2 diabetes mellitus without complications: Secondary | ICD-10-CM

## 2020-12-26 DIAGNOSIS — I10 Essential (primary) hypertension: Secondary | ICD-10-CM

## 2020-12-26 MED ORDER — FUROSEMIDE 20 MG PO TABS: 20.0000 mg | ORAL_TABLET | ORAL | Status: DC

## 2020-12-26 NOTE — Progress Notes (Signed)
Clinical Summary Shelly [name] is a 80 y.o.female seen today as a new patient. Last seen in our office in 2015   1. HTN - compliant with meds    2. Hyperlipidemia  - followed by Dr Nevada Crane, she is on pravastatin and fenofibrate  09/2020 TC 160 TG 164 HDL 52 LDL 80   3. Aortic sclerosis -05/2013 echo mean grade 10, AVA VTI 1.97   4. CKD - followed by Dr Theador Hawthorne  5. DOE - some recent symptoms of DOE with short distances - no chest pains - has had some prior LE edema, controlled with lasix mg 20mg  qod - sedentary lifestule due to visual limitations, transporation issues - sister with CHF, mother with CHF.    65. Legally blind    SH: oldest daughter works at [employer]  Past Medical History:  Diagnosis Date   Arthritis    "qwhere" (07/01/2012)   Asthma    normal chest x-ray in 2010   Basal cell carcinoma of face    Cholelithiasis    Chronic lower back pain    Degenerative joint disease    Right TKA-2010; low back pain; hands and hips also affected   Exertional shortness of breath    "mostly from being too fat" (07/01/2012)   GERD (gastroesophageal reflux disease)    history   H/O hiatal hernia    Heart murmur    small   Hemorrhoid    HTN (hypertension)    Hyperlipidemia    Macular degeneration    "both eyes" (07/01/2012)   PUD (peptic ulcer disease) 2003   Upper GI bleed-gastric ulcer; gastroesophageal reflux disease   Seasonal allergies    Type II diabetes mellitus (HCC)      Allergies  Allergen Reactions   Celebrex [Celecoxib] Other (See Comments)    Abdominal pain; history of upper GI bleed    Claritin [Loratadine] Hives and Swelling    blisters   Penicillins Hives   Percocet [Oxycodone-Acetaminophen] Other (See Comments)    Severe constipation     Current Outpatient Medications  Medication Sig Dispense Refill   acetaminophen (TYLENOL) 325 MG tablet Take 650 mg by mouth every 6 (six) hours as needed.     albuterol (PROAIR  HFA) 108 (90 BASE) MCG/ACT inhaler Inhale 2 puffs into the lungs every 6 (six) hours as needed for shortness of breath.      amLODipine (NORVASC) 10 MG tablet Take 10 mg by mouth daily.       benazepril (LOTENSIN) 40 MG tablet Take 40 mg by mouth daily.       carvedilol (COREG) 3.125 MG tablet Take 3.125 mg by mouth 2 (two) times daily. Dose reduction per patient     cetirizine (ZYRTEC) 10 MG tablet Take 10 mg by mouth daily.      Cholecalciferol (VITAMIN D PO) Take 1,000 Units by mouth.     flintstones complete (FLINTSTONES) 60 MG chewable tablet Chew 1 tablet by mouth daily.     glipiZIDE (GLUCOTROL XL) 5 MG 24 hr tablet Take 5 mg by mouth every morning.      magnesium oxide (MAG-OX) 400 (240 Mg) MG tablet Take 1 tablet (400 mg total) by mouth daily. 30 tablet 3   metFORMIN (GLUCOPHAGE) 1000 MG tablet Take 1,000 mg by mouth 2 (two) times daily with a meal.     Multiple Vitamins-Minerals (PRESERVISION AREDS PO) Take 1 tablet by mouth 2 (two) times daily.      pravastatin (  PRAVACHOL) 10 MG tablet Take 10 mg by mouth daily.     tamoxifen (NOLVADEX) 20 MG tablet TAKE 1 TABLET BY MOUTH ONCE A DAY. 90 tablet 0   traMADol (ULTRAM) 50 MG tablet Take 50 mg by mouth every 6 (six) hours as needed.     TRICOR 145 MG tablet Take 145 mg by mouth daily.      Turmeric 450 MG CAPS Take 500 mg by mouth.      No current facility-administered medications for this visit.     Past Surgical History:  Procedure Laterality Date   APPENDECTOMY     BREAST LUMPECTOMY Left    BREAST LUMPECTOMY WITH RADIOACTIVE SEED LOCALIZATION Left 10/21/2017   Procedure: BREAST LUMPECTOMY WITH RADIOACTIVE SEED LOCALIZATION;  Surgeon: Fanny Skates, MD;  Location: [facility];  Service: General;  Laterality: Left;   CATARACT EXTRACTION BILATERAL W/ ANTERIOR VITRECTOMY Bilateral    EYE SURGERY Bilateral    "laser in eyes to stop bleeding and injections for macular degeneration" (07/01/2012)   FOOT SURGERY Bilateral     straighten 1st toe with a wedge.   KNEE ARTHROSCOPY Bilateral    SKIN CANCER EXCISION  2013   "face" (07/01/2012)   TOTAL KNEE ARTHROPLASTY Right 2011   TOTAL KNEE ARTHROPLASTY Left 07/01/2012   TOTAL KNEE ARTHROPLASTY Left 07/01/2012   Procedure: LEFT TOTAL KNEE ARTHROPLASTY;  Surgeon: Garald Balding, MD;  Location: [facility];  Service: Orthopedics;  Laterality: Left;  Left Total Knee Arthroplasty     Allergies  Allergen Reactions   Celebrex [Celecoxib] Other (See Comments)    Abdominal pain; history of upper GI bleed    Claritin [Loratadine] Hives and Swelling    blisters   Penicillins Hives   Percocet [Oxycodone-Acetaminophen] Other (See Comments)    Severe constipation      Family History  Problem Relation Age of Onset   Hodgkin's lymphoma Mother    Cervical cancer Sister        half sister mom's side   Lung cancer Sister    Breast cancer Neg Hx      Social History Shelly [name] reports that she quit smoking about 39 years ago. Her smoking use included cigarettes. She has a 67.50 pack-year smoking history. She has never used smokeless tobacco. Shelly [name] reports no history of alcohol use.   Review of Systems CONSTITUTIONAL: No weight loss, fever, chills, weakness or fatigue.  HEENT: Eyes: No visual loss, blurred vision, double vision or yellow sclerae.No hearing loss, sneezing, congestion, runny nose or sore throat.  SKIN: No rash or itching.  CARDIOVASCULAR: per hpi RESPIRATORY: per hpi GASTROINTESTINAL: No anorexia, nausea, vomiting or diarrhea. No abdominal pain or blood.  GENITOURINARY: No burning on urination, no polyuria NEUROLOGICAL: No headache, dizziness, syncope, paralysis, ataxia, numbness or tingling in the extremities. No change in bowel or bladder control.  MUSCULOSKELETAL: No muscle, back pain, joint pain or stiffness.  LYMPHATICS: No enlarged nodes. No history of splenectomy.  PSYCHIATRIC: No history of depression or anxiety.  ENDOCRINOLOGIC: No  reports of sweating, cold or heat intolerance. No polyuria or polydipsia.  Marland Kitchen   Physical Examination Today's Vitals   12/26/20 0901  BP: 132/74  Pulse: 86  SpO2: 94%  Weight: 232 lb 9.6 oz (105.5 kg)  Height: 5\' 3"  (1.6 m)   Body mass index is 41.2 kg/m.  Gen: resting comfortably, no acute distress HEENT: no scleral icterus, pupils equal round and reactive, no palptable cervical adenopathy,  CV: RRR, 2/6 systolic  murmur rusb, no jvd Resp: Clear to auscultation bilaterally GI: abdomen is soft, non-tender, non-distended, normal bowel sounds, no hepatosplenomegaly MSK: extremities are warm, no edema.  Skin: warm, no rash Neuro:  no focal deficits Psych: appropriate affect   Diagnostic Studies  05/2012 Carotid US IMPRESSION:  Minimal amount of bilateral intimal wall thickening, not definitely resulting in hemodynamically significant stenosis.     06/2012 Venous LE [place] No evidence of deep vein thrombosis involving the left lower extremity. - No evidence of Baker's cyst on the left. Other specific details can be found in the table(s) above. Prepared and Electronically Authenticated by   05/2013 echo Study Conclusions   - Left ventricle: The cavity size was normal. Wall thickness    was increased in a pattern of mild LVH. Systolic function    was normal. The estimated ejection fraction was in the    range of 60% to 65%. Wall motion was normal; there were no    regional wall motion abnormalities. Left ventricular    diastolic function parameters were normal.  - Aortic valve: Mildly calcified annulus. Trileaflet; mildly    thickened leaflets. Mildly increased transvalvular    velocity. Trivial regurgitation. Peak velocity:211cm/s    (S). Mean gradient:31mm Hg (S). Valve area: 1.97cm^2(VTI).  - Mitral valve: Mild mitral annular calcification.  - Left atrium: The atrium was mildly dilated.   Assessment and Plan    1. DOE - unclear etiology - aortic sclerosis/ very mild  AS from echo 2015, repeat echo - pending results given her multiple CAD risk factors consider testing for ischemia. Given body habitus coronary CTA may be best option, last Cr 1.37 - EKG today shows SR, no acute ischemic changes   2.HTN - at goal, continue current meds   3. Hyperlipidemia - lipids overall at goal, continue current meds   4. DM2 - glycemic control per pcp - from cardiac standpoint she is on statin, ACEi    Arnoldo Lenis, M.D.

## 2020-12-26 NOTE — Addendum Note (Signed)
Addended by: Christella Scheuermann C on: 12/26/2020 10:03 AM   Modules accepted: Orders

## 2020-12-26 NOTE — Patient Instructions (Addendum)

## 2020-12-27 ENCOUNTER — Telehealth: Payer: Self-pay | Admitting: Cardiology

## 2020-12-27 NOTE — Telephone Encounter (Signed)
Checking percert on the following patient for testing scheduled at [facility].     ECHO -  02/08/2021

## 2021-01-18 ENCOUNTER — Ambulatory Visit
Admission: RE | Admit: 2021-01-18 | Discharge: 2021-01-18 | Disposition: A | Discharge status: Home / Self Care | Payer: PPO | Source: Ambulatory Visit | Attending: Adult Health | Admitting: Adult Health

## 2021-01-18 ENCOUNTER — Other Ambulatory Visit: Payer: Self-pay | Admitting: Adult Health

## 2021-01-18 DIAGNOSIS — Z1231 Encounter for screening mammogram for malignant neoplasm of breast: Secondary | ICD-10-CM | POA: Diagnosis not present

## 2021-01-18 DIAGNOSIS — Z853 Personal history of malignant neoplasm of breast: Secondary | ICD-10-CM

## 2021-01-26 DIAGNOSIS — E559 Vitamin D deficiency, unspecified: Secondary | ICD-10-CM | POA: Diagnosis not present

## 2021-01-26 DIAGNOSIS — I10 Essential (primary) hypertension: Secondary | ICD-10-CM | POA: Diagnosis not present

## 2021-01-26 DIAGNOSIS — E538 Deficiency of other specified B group vitamins: Secondary | ICD-10-CM | POA: Diagnosis not present

## 2021-01-26 DIAGNOSIS — E1169 Type 2 diabetes mellitus with other specified complication: Secondary | ICD-10-CM | POA: Diagnosis not present

## 2021-01-26 DIAGNOSIS — E875 Hyperkalemia: Secondary | ICD-10-CM | POA: Diagnosis not present

## 2021-01-26 DIAGNOSIS — E782 Mixed hyperlipidemia: Secondary | ICD-10-CM | POA: Diagnosis not present

## 2021-01-31 DIAGNOSIS — E261 Secondary hyperaldosteronism: Secondary | ICD-10-CM | POA: Insufficient documentation

## 2021-01-31 DIAGNOSIS — D509 Iron deficiency anemia, unspecified: Secondary | ICD-10-CM | POA: Diagnosis not present

## 2021-01-31 DIAGNOSIS — J069 Acute upper respiratory infection, unspecified: Secondary | ICD-10-CM | POA: Insufficient documentation

## 2021-01-31 DIAGNOSIS — E538 Deficiency of other specified B group vitamins: Secondary | ICD-10-CM | POA: Diagnosis not present

## 2021-01-31 DIAGNOSIS — C50412 Malignant neoplasm of upper-outer quadrant of left female breast: Secondary | ICD-10-CM | POA: Diagnosis not present

## 2021-01-31 DIAGNOSIS — R778 Other specified abnormalities of plasma proteins: Secondary | ICD-10-CM | POA: Diagnosis not present

## 2021-01-31 DIAGNOSIS — E782 Mixed hyperlipidemia: Secondary | ICD-10-CM | POA: Diagnosis not present

## 2021-01-31 DIAGNOSIS — H3530 Unspecified macular degeneration: Secondary | ICD-10-CM | POA: Diagnosis not present

## 2021-01-31 DIAGNOSIS — I10 Essential (primary) hypertension: Secondary | ICD-10-CM | POA: Diagnosis not present

## 2021-01-31 DIAGNOSIS — E875 Hyperkalemia: Secondary | ICD-10-CM | POA: Diagnosis not present

## 2021-01-31 DIAGNOSIS — E559 Vitamin D deficiency, unspecified: Secondary | ICD-10-CM | POA: Diagnosis not present

## 2021-01-31 DIAGNOSIS — N1831 Chronic kidney disease, stage 3a: Secondary | ICD-10-CM | POA: Diagnosis not present

## 2021-01-31 DIAGNOSIS — E1169 Type 2 diabetes mellitus with other specified complication: Secondary | ICD-10-CM | POA: Diagnosis not present

## 2021-02-02 DIAGNOSIS — J069 Acute upper respiratory infection, unspecified: Secondary | ICD-10-CM | POA: Diagnosis not present

## 2021-02-02 DIAGNOSIS — U071 COVID-19: Secondary | ICD-10-CM | POA: Diagnosis not present

## 2021-02-08 ENCOUNTER — Ambulatory Visit (HOSPITAL_COMMUNITY): Payer: PPO

## 2021-03-01 DIAGNOSIS — N189 Chronic kidney disease, unspecified: Secondary | ICD-10-CM | POA: Diagnosis not present

## 2021-03-01 DIAGNOSIS — I129 Hypertensive chronic kidney disease with stage 1 through stage 4 chronic kidney disease, or unspecified chronic kidney disease: Secondary | ICD-10-CM | POA: Diagnosis not present

## 2021-03-01 DIAGNOSIS — E1122 Type 2 diabetes mellitus with diabetic chronic kidney disease: Secondary | ICD-10-CM | POA: Diagnosis not present

## 2021-03-05 DIAGNOSIS — Z1211 Encounter for screening for malignant neoplasm of colon: Secondary | ICD-10-CM | POA: Diagnosis not present

## 2021-03-09 DIAGNOSIS — E1122 Type 2 diabetes mellitus with diabetic chronic kidney disease: Secondary | ICD-10-CM | POA: Diagnosis not present

## 2021-03-09 DIAGNOSIS — E6609 Other obesity due to excess calories: Secondary | ICD-10-CM | POA: Diagnosis not present

## 2021-03-09 DIAGNOSIS — N19 Unspecified kidney failure: Secondary | ICD-10-CM | POA: Diagnosis not present

## 2021-03-09 DIAGNOSIS — N189 Chronic kidney disease, unspecified: Secondary | ICD-10-CM | POA: Diagnosis not present

## 2021-03-09 DIAGNOSIS — I129 Hypertensive chronic kidney disease with stage 1 through stage 4 chronic kidney disease, or unspecified chronic kidney disease: Secondary | ICD-10-CM | POA: Diagnosis not present

## 2021-03-09 DIAGNOSIS — I517 Cardiomegaly: Secondary | ICD-10-CM | POA: Diagnosis not present

## 2021-03-11 LAB — COLOGUARD: COLOGUARD: NEGATIVE

## 2021-03-13 ENCOUNTER — Other Ambulatory Visit (HOSPITAL_COMMUNITY): Payer: PPO

## 2021-03-14 DIAGNOSIS — E782 Mixed hyperlipidemia: Secondary | ICD-10-CM | POA: Diagnosis not present

## 2021-03-14 DIAGNOSIS — I10 Essential (primary) hypertension: Secondary | ICD-10-CM | POA: Diagnosis not present

## 2021-03-24 ENCOUNTER — Other Ambulatory Visit: Payer: Self-pay

## 2021-03-24 ENCOUNTER — Ambulatory Visit (HOSPITAL_COMMUNITY)
Admission: RE | Admit: 2021-03-24 | Discharge: 2021-03-24 | Disposition: A | Discharge status: Home / Self Care | Payer: PPO | Source: Ambulatory Visit | Attending: Cardiology | Admitting: Cardiology

## 2021-03-24 ENCOUNTER — Ambulatory Visit: Payer: PPO | Admitting: Cardiology

## 2021-03-24 DIAGNOSIS — R0609 Other forms of dyspnea: Secondary | ICD-10-CM | POA: Diagnosis not present

## 2021-03-24 LAB — ECHOCARDIOGRAM COMPLETE
AR max vel: 2.57 cm2
AV Area VTI: 2.34 cm2
AV Area mean vel: 2.38 cm2
AV Mean grad: 7 mmHg
AV Peak grad: 15.1 mmHg
Ao pk vel: 1.94 m/s
Area-P 1/2: 3.08 cm2
Calc EF: 69.8 %
MV VTI: 3.62 cm2
S' Lateral: 2.5 cm
Single Plane A2C EF: 72.6 %
Single Plane A4C EF: 67.5 %

## 2021-03-24 NOTE — Progress Notes (Signed)
*  PRELIMINARY RESULTS* Echocardiogram 2D Echocardiogram has been performed.  Shelly [name] 03/24/2021, 2:07 PM

## 2021-03-24 NOTE — Progress Notes (Deleted)
Clinical Summary Shelly [name] is a 81 y.o.femaleseen today as a new patient. Last seen in our office in 2015     1. HTN - compliant with meds     2. Hyperlipidemia  - followed by Dr Nevada Crane, she is on pravastatin and fenofibrate   09/2020 TC 160 TG 164 HDL 52 LDL 80    3. Aortic sclerosis -05/2013 echo mean grade 10, AVA VTI 1.97   - did not go for repeat echo since last visit   4. CKD - followed by Dr Theador Hawthorne   5. DOE - some recent symptoms of DOE with short distances - no chest pains - has had some prior LE edema, controlled with lasix mg 20mg  qod - sedentary lifestule due to visual limitations, transporation issues - sister with CHF, mother with CHF.      33. Legally blind       SH: oldest daughter works at [employer]   Past Medical History:  Diagnosis Date   Arthritis    "qwhere" (07/01/2012)   Asthma    normal chest x-ray in 2010   Basal cell carcinoma of face    Breast cancer ([place])    Cholelithiasis    Chronic lower back pain    Degenerative joint disease    Right TKA-2010; low back pain; hands and hips also affected   Exertional shortness of breath    "mostly from being too fat" (07/01/2012)   GERD (gastroesophageal reflux disease)    history   H/O hiatal hernia    Heart murmur    small   Hemorrhoid    HTN (hypertension)    Hyperlipidemia    Macular degeneration    "both eyes" (07/01/2012)   PUD (peptic ulcer disease) 2003   Upper GI bleed-gastric ulcer; gastroesophageal reflux disease   Seasonal allergies    Type II diabetes mellitus (HCC)      Allergies  Allergen Reactions   Celebrex [Celecoxib] Other (See Comments)    Abdominal pain; history of upper GI bleed    Claritin [Loratadine] Hives and Swelling    blisters   Penicillins Hives   Percocet [Oxycodone-Acetaminophen] Other (See Comments)    Severe constipation     Current Outpatient Medications  Medication Sig Dispense Refill   acetaminophen (TYLENOL) 325  MG tablet Take 650 mg by mouth every 6 (six) hours as needed.     albuterol (VENTOLIN HFA) 108 (90 Base) MCG/ACT inhaler Inhale 2 puffs into the lungs every 6 (six) hours as needed for shortness of breath.      amLODipine (NORVASC) 10 MG tablet Take 10 mg by mouth daily.       carvedilol (COREG) 3.125 MG tablet Take 3.125 mg by mouth 2 (two) times daily. Dose reduction per patient     cetirizine (ZYRTEC) 10 MG tablet Take 10 mg by mouth daily.      Cholecalciferol (VITAMIN D PO) Take 1,000 Units by mouth.     cyclobenzaprine (FLEXERIL) 5 MG tablet cyclobenzaprine 5 mg tablet     flintstones complete (FLINTSTONES) 60 MG chewable tablet Chew 1 tablet by mouth daily.     furosemide (LASIX) 20 MG tablet Take 1 tablet (20 mg total) by mouth every other day.     glipiZIDE (GLUCOTROL XL) 5 MG 24 hr tablet Take 5 mg by mouth every morning.      magnesium oxide (MAG-OX) 400 (240 Mg) MG tablet Take 1 tablet (400 mg total) by mouth daily. [place]  tablet 3   metFORMIN (GLUCOPHAGE) 1000 MG tablet Take 1,000 mg by mouth 2 (two) times daily with a meal.     Multiple Vitamins-Minerals (PRESERVISION AREDS PO) Take 1 tablet by mouth 2 (two) times daily.      pravastatin (PRAVACHOL) 10 MG tablet Take 10 mg by mouth daily.     tamoxifen (NOLVADEX) 20 MG tablet TAKE 1 TABLET BY MOUTH ONCE A DAY. 90 tablet 0   Turmeric 450 MG CAPS Take 500 mg by mouth.      No current facility-administered medications for this visit.     Past Surgical History:  Procedure Laterality Date   APPENDECTOMY     BREAST LUMPECTOMY Left    BREAST LUMPECTOMY WITH RADIOACTIVE SEED LOCALIZATION Left 10/21/2017   Procedure: BREAST LUMPECTOMY WITH RADIOACTIVE SEED LOCALIZATION;  Surgeon: Fanny Skates, MD;  Location: [facility];  Service: General;  Laterality: Left;   CATARACT EXTRACTION BILATERAL W/ ANTERIOR VITRECTOMY Bilateral    EYE SURGERY Bilateral    "laser in eyes to stop bleeding and injections for macular degeneration"  (07/01/2012)   FOOT SURGERY Bilateral    straighten 1st toe with a wedge.   KNEE ARTHROSCOPY Bilateral    SKIN CANCER EXCISION  2013   "face" (07/01/2012)   TOTAL KNEE ARTHROPLASTY Right 2011   TOTAL KNEE ARTHROPLASTY Left 07/01/2012   TOTAL KNEE ARTHROPLASTY Left 07/01/2012   Procedure: LEFT TOTAL KNEE ARTHROPLASTY;  Surgeon: Garald Balding, MD;  Location: [facility];  Service: Orthopedics;  Laterality: Left;  Left Total Knee Arthroplasty     Allergies  Allergen Reactions   Celebrex [Celecoxib] Other (See Comments)    Abdominal pain; history of upper GI bleed    Claritin [Loratadine] Hives and Swelling    blisters   Penicillins Hives   Percocet [Oxycodone-Acetaminophen] Other (See Comments)    Severe constipation      Family History  Problem Relation Age of Onset   Hodgkin's lymphoma Mother    Cervical cancer Sister        half sister mom's side   Lung cancer Sister    Breast cancer Neg Hx      Social History Ms. Brissett reports that she quit smoking about 40 years ago. Her smoking use included cigarettes. She has a 67.50 pack-year smoking history. She has never used smokeless tobacco. Ms. Brunker reports no history of alcohol use.   Review of Systems CONSTITUTIONAL: No weight loss, fever, chills, weakness or fatigue.  HEENT: Eyes: No visual loss, blurred vision, double vision or yellow sclerae.No hearing loss, sneezing, congestion, runny nose or sore throat.  SKIN: No rash or itching.  CARDIOVASCULAR:  RESPIRATORY: No shortness of breath, cough or sputum.  GASTROINTESTINAL: No anorexia, nausea, vomiting or diarrhea. No abdominal pain or blood.  GENITOURINARY: No burning on urination, no polyuria NEUROLOGICAL: No headache, dizziness, syncope, paralysis, ataxia, numbness or tingling in the extremities. No change in bowel or bladder control.  MUSCULOSKELETAL: No muscle, back pain, joint pain or stiffness.  LYMPHATICS: No enlarged nodes. No history of splenectomy.   PSYCHIATRIC: No history of depression or anxiety.  ENDOCRINOLOGIC: No reports of sweating, cold or heat intolerance. No polyuria or polydipsia.  Marland Kitchen   Physical Examination There were no vitals filed for this visit. There were no vitals filed for this visit.  Gen: resting comfortably, no acute distress HEENT: no scleral icterus, pupils equal round and reactive, no palptable cervical adenopathy,  CV Resp: Clear to auscultation bilaterally GI: abdomen is soft,  non-tender, non-distended, normal bowel sounds, no hepatosplenomegaly MSK: extremities are warm, no edema.  Skin: warm, no rash Neuro:  no focal deficits Psych: appropriate affect   Diagnostic Studies  05/2012 Carotid US IMPRESSION:  Minimal amount of bilateral intimal wall thickening, not definitely resulting in hemodynamically significant stenosis.     06/2012 Venous LE [place] No evidence of deep vein thrombosis involving the left lower extremity. - No evidence of Baker's cyst on the left. Other specific details can be found in the table(s) above. Prepared and Electronically Authenticated by     05/2013 echo Study Conclusions   - Left ventricle: The cavity size was normal. Wall thickness    was increased in a pattern of mild LVH. Systolic function    was normal. The estimated ejection fraction was in the    range of 60% to 65%. Wall motion was normal; there were no    regional wall motion abnormalities. Left ventricular    diastolic function parameters were normal.  - Aortic valve: Mildly calcified annulus. Trileaflet; mildly    thickened leaflets. Mildly increased transvalvular    velocity. Trivial regurgitation. Peak velocity:211cm/s    (S). Mean gradient:38mm Hg (S). Valve area: 1.97cm^2(VTI).  - Mitral valve: Mild mitral annular calcification.  - Left atrium: The atrium was mildly dilated.      Assessment and Plan  1. DOE - unclear etiology - aortic sclerosis/ very mild AS from echo 2015, repeat echo -  pending results given her multiple CAD risk factors consider testing for ischemia. Given body habitus coronary CTA may be best option, last Cr 1.37 - EKG today shows SR, no acute ischemic changes   2.HTN - at goal, continue current meds   3. Hyperlipidemia - lipids overall at goal, continue current meds   4. DM2 - glycemic control per pcp - from cardiac standpoint she is on statin, ACEi      Arnoldo Lenis, M.D., F.A.C.C.

## 2021-03-28 ENCOUNTER — Telehealth: Payer: Self-pay

## 2021-03-28 NOTE — Telephone Encounter (Signed)
-----   Message from Arnoldo Lenis, MD sent at 03/26/2021  9:42 AM EST ----- Normal echo   J BrancH MD

## 2021-03-29 NOTE — Telephone Encounter (Signed)
° °  Pt is returning call, she said she missed a call from [place]

## 2021-03-30 NOTE — Telephone Encounter (Signed)
I called patient 2 times, it rings twice then no sound,does not connect.The third call went straight to vm that said it was full.

## 2021-03-30 NOTE — Telephone Encounter (Signed)
Letter mailed to have patient contact the office for results.

## 2021-04-19 ENCOUNTER — Ambulatory Visit: Payer: PPO | Admitting: Cardiology

## 2021-04-19 ENCOUNTER — Encounter: Payer: Self-pay | Admitting: Cardiology

## 2021-04-19 VITALS — BP 134/70 | HR 88 | Ht 63.0 in | Wt 228.6 lb

## 2021-04-19 DIAGNOSIS — I10 Essential (primary) hypertension: Secondary | ICD-10-CM

## 2021-04-19 DIAGNOSIS — R0609 Other forms of dyspnea: Secondary | ICD-10-CM | POA: Diagnosis not present

## 2021-04-19 NOTE — Progress Notes (Signed)
Clinical Summary Ms. Owczarzak is a 81 y.o.female seen today for follow up of the following medical problems.      1. HTN - remains compliant with meds     2. Hyperlipidemia  - followed by Dr Nevada Crane, she is on pravastatin and fenofibrate   09/2020 TC 160 TG 164 HDL 52 LDL 80  01/2021 TC 157 TG 221 HDL 42 LDL 78   3. Aortic sclerosis -05/2013 echo mean grade 10, AVA VTI 1.97 03/2021 echo mean grad 7, AVA VTI 2.34. No significant stenosis     4. CKD - followed by Dr Theador Hawthorne   5. DOE - some recent symptoms of DOE with short distances - no chest pains - has had some prior LE edema, controlled with lasix mg '20mg'$  qod - sedentary lifestule due to visual limitations, transporation issues - sister with CHF, mother with CHF.    2013 PFTs: moderate obstruction 03/2021 echo: LVEF 60-65%, no WMAs, grade I DD, normal RV, no signficant valve pathology - chronic SOB. Has had recent URIs.   18. Legally blind       SH: oldest daughter works at [employer]   Past Medical History:  Diagnosis Date   Arthritis    "qwhere" (07/01/2012)   Asthma    normal chest x-ray in 2010   Basal cell carcinoma of face    Breast cancer ([place])    Cholelithiasis    Chronic lower back pain    Degenerative joint disease    Right TKA-2010; low back pain; hands and hips also affected   Exertional shortness of breath    "mostly from being too fat" (07/01/2012)   GERD (gastroesophageal reflux disease)    history   H/O hiatal hernia    Heart murmur    small   Hemorrhoid    HTN (hypertension)    Hyperlipidemia    Macular degeneration    "both eyes" (07/01/2012)   PUD (peptic ulcer disease) 2003   Upper GI bleed-gastric ulcer; gastroesophageal reflux disease   Seasonal allergies    Type II diabetes mellitus (HCC)      Allergies  Allergen Reactions   Celebrex [Celecoxib] Other (See Comments)    Abdominal pain; history of upper GI bleed    Claritin [Loratadine] Hives and  Swelling    blisters   Penicillins Hives   Percocet [Oxycodone-Acetaminophen] Other (See Comments)    Severe constipation     Current Outpatient Medications  Medication Sig Dispense Refill   acetaminophen (TYLENOL) 325 MG tablet Take 650 mg by mouth every 6 (six) hours as needed.     albuterol (VENTOLIN HFA) 108 (90 Base) MCG/ACT inhaler Inhale 2 puffs into the lungs every 6 (six) hours as needed for shortness of breath.      amLODipine (NORVASC) 10 MG tablet Take 10 mg by mouth daily.       carvedilol (COREG) 3.125 MG tablet Take 3.125 mg by mouth 2 (two) times daily. Dose reduction per patient     cetirizine (ZYRTEC) 10 MG tablet Take 10 mg by mouth daily.      Cholecalciferol (VITAMIN D PO) Take 1,000 Units by mouth.     cyclobenzaprine (FLEXERIL) 5 MG tablet cyclobenzaprine 5 mg tablet     flintstones complete (FLINTSTONES) 60 MG chewable tablet Chew 1 tablet by mouth daily.     furosemide (LASIX) 20 MG tablet Take 1 tablet (20 mg total) by mouth every other day.     glipiZIDE (GLUCOTROL  XL) 5 MG 24 hr tablet Take 5 mg by mouth every morning.      magnesium oxide (MAG-OX) 400 (240 Mg) MG tablet Take 1 tablet (400 mg total) by mouth daily. 30 tablet 3   metFORMIN (GLUCOPHAGE) 1000 MG tablet Take 1,000 mg by mouth 2 (two) times daily with a meal.     Multiple Vitamins-Minerals (PRESERVISION AREDS PO) Take 1 tablet by mouth 2 (two) times daily.      pravastatin (PRAVACHOL) 10 MG tablet Take 10 mg by mouth daily.     tamoxifen (NOLVADEX) 20 MG tablet TAKE 1 TABLET BY MOUTH ONCE A DAY. 90 tablet 0   Turmeric 450 MG CAPS Take 500 mg by mouth.      No current facility-administered medications for this visit.     Past Surgical History:  Procedure Laterality Date   APPENDECTOMY     BREAST LUMPECTOMY Left    BREAST LUMPECTOMY WITH RADIOACTIVE SEED LOCALIZATION Left 10/21/2017   Procedure: BREAST LUMPECTOMY WITH RADIOACTIVE SEED LOCALIZATION;  Surgeon: Fanny Skates, MD;  Location: [place];  Service: General;  Laterality: Left;   CATARACT EXTRACTION BILATERAL W/ ANTERIOR VITRECTOMY Bilateral    EYE SURGERY Bilateral    "laser in eyes to stop bleeding and injections for macular degeneration" (07/01/2012)   FOOT SURGERY Bilateral    straighten 1st toe with a wedge.   KNEE ARTHROSCOPY Bilateral    SKIN CANCER EXCISION  2013   "face" (07/01/2012)   TOTAL KNEE ARTHROPLASTY Right 2011   TOTAL KNEE ARTHROPLASTY Left 07/01/2012   TOTAL KNEE ARTHROPLASTY Left 07/01/2012   Procedure: LEFT TOTAL KNEE ARTHROPLASTY;  Surgeon: Garald Balding, MD;  Location: [facility];  Service: Orthopedics;  Laterality: Left;  Left Total Knee Arthroplasty     Allergies  Allergen Reactions   Celebrex [Celecoxib] Other (See Comments)    Abdominal pain; history of upper GI bleed    Claritin [Loratadine] Hives and Swelling    blisters   Penicillins Hives   Percocet [Oxycodone-Acetaminophen] Other (See Comments)    Severe constipation      Family History  Problem Relation Age of Onset   Hodgkin's lymphoma Mother    Cervical cancer Sister        half sister mom's side   Lung cancer Sister    Breast cancer Neg Hx      Social History Ms. Hackley reports that she quit smoking about 40 years ago. Her smoking use included cigarettes. She has a 67.50 pack-year smoking history. She has never used smokeless tobacco. Ms. Groll reports no history of alcohol use.   Review of Systems CONSTITUTIONAL: No weight loss, fever, chills, weakness or fatigue.  HEENT: Eyes: No visual loss, blurred vision, double vision or yellow sclerae.No hearing loss, sneezing, congestion, runny nose or sore throat.  SKIN: No rash or itching.  CARDIOVASCULAR: per hpi RESPIRATORY: per hpi GASTROINTESTINAL: No anorexia, nausea, vomiting or diarrhea. No abdominal pain or blood.  GENITOURINARY: No burning on urination, no polyuria NEUROLOGICAL: No headache, dizziness, syncope, paralysis, ataxia, numbness or  tingling in the extremities. No change in bowel or bladder control.  MUSCULOSKELETAL: No muscle, back pain, joint pain or stiffness.  LYMPHATICS: No enlarged nodes. No history of splenectomy.  PSYCHIATRIC: No history of depression or anxiety.  ENDOCRINOLOGIC: No reports of sweating, cold or heat intolerance. No polyuria or polydipsia.  Marland Kitchen   Physical Examination Today's Vitals   04/19/21 1529  BP: 134/70  Pulse: 88  SpO2: 98%  Weight: 228 lb 9.6 oz (103.7 kg)  Height: '5\' 3"'$  (1.6 m)   Body mass index is 40.49 kg/m.  Gen: resting comfortably, no acute distress HEENT: no scleral icterus, pupils equal round and reactive, no palptable cervical adenopathy,  CV: RRR, 2/6 systolic murmur rusb, no jvd Resp: Clear to auscultation bilaterally GI: abdomen is soft, non-tender, non-distended, normal bowel sounds, no hepatosplenomegaly MSK: extremities are warm, no edema.  Skin: warm, no rash Neuro:  no focal deficits Psych: appropriate affect   Diagnostic Studies  05/2012 Carotid US IMPRESSION:  Minimal amount of bilateral intimal wall thickening, not definitely resulting in hemodynamically significant stenosis.     06/2012 Venous LE [place] No evidence of deep vein thrombosis involving the left lower extremity. - No evidence of Baker's cyst on the left. Other specific details can be found in the table(s) above. Prepared and Electronically Authenticated by     05/2013 echo Study Conclusions   - Left ventricle: The cavity size was normal. Wall thickness    was increased in a pattern of mild LVH. Systolic function    was normal. The estimated ejection fraction was in the    range of 60% to 65%. Wall motion was normal; there were no    regional wall motion abnormalities. Left ventricular    diastolic function parameters were normal.  - Aortic valve: Mildly calcified annulus. Trileaflet; mildly    thickened leaflets. Mildly increased transvalvular    velocity. Trivial regurgitation.  Peak velocity:211cm/s    (S). Mean gradient:17m Hg (S). Valve area: 1.97cm^2(VTI).  - Mitral valve: Mild mitral annular calcification.  - Left atrium: The atrium was mildly dilated.    03/24/2021 echo  1. Left ventricular ejection fraction, by estimation, is 60 to 65%. The  left ventricle has normal function. The left ventricle has no regional  wall motion abnormalities. There is mild asymmetric left ventricular  hypertrophy of the basal-septal segment.  Left ventricular diastolic parameters are consistent with Grade I  diastolic dysfunction (impaired relaxation).   2. Right ventricular systolic function is normal. The right ventricular  size is normal. There is normal pulmonary artery systolic pressure.   3. The mitral valve is normal in structure. No evidence of mitral valve  regurgitation. No evidence of mitral stenosis.   4. The aortic valve is normal in structure. Aortic valve regurgitation is  not visualized. No aortic stenosis is present.   5. The inferior vena cava is normal in size with greater than 50%  respiratory variability, suggesting right atrial pressure of 3 mmHg.     Assessment and Plan   1. DOE -recent benign echo - likely multifactorial with asthma, obesity, deconditioning as activities are limited by severe vision deficit.  - has not had any chest pains, at this time would not plan for ischemic testing.    2.HTN - remains at goal, continue current meds     JArnoldo Lenis M.D.

## 2021-04-19 NOTE — Patient Instructions (Signed)

## 2021-04-27 DIAGNOSIS — L03031 Cellulitis of right toe: Secondary | ICD-10-CM | POA: Diagnosis not present

## 2021-05-25 DIAGNOSIS — N189 Chronic kidney disease, unspecified: Secondary | ICD-10-CM | POA: Diagnosis not present

## 2021-05-25 DIAGNOSIS — I129 Hypertensive chronic kidney disease with stage 1 through stage 4 chronic kidney disease, or unspecified chronic kidney disease: Secondary | ICD-10-CM | POA: Diagnosis not present

## 2021-05-25 DIAGNOSIS — N19 Unspecified kidney failure: Secondary | ICD-10-CM | POA: Diagnosis not present

## 2021-05-25 DIAGNOSIS — I517 Cardiomegaly: Secondary | ICD-10-CM | POA: Diagnosis not present

## 2021-05-25 DIAGNOSIS — E6609 Other obesity due to excess calories: Secondary | ICD-10-CM | POA: Diagnosis not present

## 2021-05-25 DIAGNOSIS — E1122 Type 2 diabetes mellitus with diabetic chronic kidney disease: Secondary | ICD-10-CM | POA: Diagnosis not present

## 2021-05-30 DIAGNOSIS — D509 Iron deficiency anemia, unspecified: Secondary | ICD-10-CM | POA: Diagnosis not present

## 2021-05-30 DIAGNOSIS — E1169 Type 2 diabetes mellitus with other specified complication: Secondary | ICD-10-CM | POA: Diagnosis not present

## 2021-05-30 DIAGNOSIS — E782 Mixed hyperlipidemia: Secondary | ICD-10-CM | POA: Diagnosis not present

## 2021-06-06 DIAGNOSIS — R778 Other specified abnormalities of plasma proteins: Secondary | ICD-10-CM | POA: Diagnosis not present

## 2021-06-06 DIAGNOSIS — E782 Mixed hyperlipidemia: Secondary | ICD-10-CM | POA: Diagnosis not present

## 2021-06-06 DIAGNOSIS — C50412 Malignant neoplasm of upper-outer quadrant of left female breast: Secondary | ICD-10-CM | POA: Diagnosis not present

## 2021-06-06 DIAGNOSIS — I10 Essential (primary) hypertension: Secondary | ICD-10-CM | POA: Diagnosis not present

## 2021-06-06 DIAGNOSIS — N1831 Chronic kidney disease, stage 3a: Secondary | ICD-10-CM | POA: Diagnosis not present

## 2021-06-06 DIAGNOSIS — D509 Iron deficiency anemia, unspecified: Secondary | ICD-10-CM | POA: Diagnosis not present

## 2021-06-06 DIAGNOSIS — E538 Deficiency of other specified B group vitamins: Secondary | ICD-10-CM | POA: Diagnosis not present

## 2021-06-06 DIAGNOSIS — H3530 Unspecified macular degeneration: Secondary | ICD-10-CM | POA: Diagnosis not present

## 2021-06-06 DIAGNOSIS — E875 Hyperkalemia: Secondary | ICD-10-CM | POA: Diagnosis not present

## 2021-06-06 DIAGNOSIS — E559 Vitamin D deficiency, unspecified: Secondary | ICD-10-CM | POA: Diagnosis not present

## 2021-06-06 DIAGNOSIS — E1169 Type 2 diabetes mellitus with other specified complication: Secondary | ICD-10-CM | POA: Diagnosis not present

## 2021-06-08 DIAGNOSIS — I129 Hypertensive chronic kidney disease with stage 1 through stage 4 chronic kidney disease, or unspecified chronic kidney disease: Secondary | ICD-10-CM | POA: Diagnosis not present

## 2021-06-08 DIAGNOSIS — E875 Hyperkalemia: Secondary | ICD-10-CM | POA: Diagnosis not present

## 2021-06-08 DIAGNOSIS — Z7689 Persons encountering health services in other specified circumstances: Secondary | ICD-10-CM | POA: Diagnosis not present

## 2021-06-08 DIAGNOSIS — N189 Chronic kidney disease, unspecified: Secondary | ICD-10-CM | POA: Diagnosis not present

## 2021-06-08 DIAGNOSIS — E6609 Other obesity due to excess calories: Secondary | ICD-10-CM | POA: Diagnosis not present

## 2021-06-08 DIAGNOSIS — N19 Unspecified kidney failure: Secondary | ICD-10-CM | POA: Diagnosis not present

## 2021-06-08 DIAGNOSIS — E1122 Type 2 diabetes mellitus with diabetic chronic kidney disease: Secondary | ICD-10-CM | POA: Diagnosis not present

## 2021-07-05 ENCOUNTER — Encounter (INDEPENDENT_AMBULATORY_CARE_PROVIDER_SITE_OTHER): Payer: PPO | Admitting: Ophthalmology

## 2021-07-05 DIAGNOSIS — I10 Essential (primary) hypertension: Secondary | ICD-10-CM

## 2021-07-05 DIAGNOSIS — H43813 Vitreous degeneration, bilateral: Secondary | ICD-10-CM

## 2021-07-05 DIAGNOSIS — H353231 Exudative age-related macular degeneration, bilateral, with active choroidal neovascularization: Secondary | ICD-10-CM

## 2021-07-05 DIAGNOSIS — H35033 Hypertensive retinopathy, bilateral: Secondary | ICD-10-CM

## 2021-07-27 DIAGNOSIS — M25571 Pain in right ankle and joints of right foot: Secondary | ICD-10-CM | POA: Diagnosis not present

## 2021-07-27 DIAGNOSIS — M79671 Pain in right foot: Secondary | ICD-10-CM | POA: Diagnosis not present

## 2021-08-10 DIAGNOSIS — I129 Hypertensive chronic kidney disease with stage 1 through stage 4 chronic kidney disease, or unspecified chronic kidney disease: Secondary | ICD-10-CM | POA: Diagnosis not present

## 2021-08-10 DIAGNOSIS — Z7689 Persons encountering health services in other specified circumstances: Secondary | ICD-10-CM | POA: Diagnosis not present

## 2021-08-10 DIAGNOSIS — N189 Chronic kidney disease, unspecified: Secondary | ICD-10-CM | POA: Diagnosis not present

## 2021-08-10 DIAGNOSIS — E1122 Type 2 diabetes mellitus with diabetic chronic kidney disease: Secondary | ICD-10-CM | POA: Diagnosis not present

## 2021-08-10 DIAGNOSIS — N19 Unspecified kidney failure: Secondary | ICD-10-CM | POA: Diagnosis not present

## 2021-08-10 DIAGNOSIS — E6609 Other obesity due to excess calories: Secondary | ICD-10-CM | POA: Diagnosis not present

## 2021-08-17 DIAGNOSIS — N189 Chronic kidney disease, unspecified: Secondary | ICD-10-CM | POA: Diagnosis not present

## 2021-08-17 DIAGNOSIS — Z7689 Persons encountering health services in other specified circumstances: Secondary | ICD-10-CM | POA: Diagnosis not present

## 2021-08-17 DIAGNOSIS — Z6841 Body Mass Index (BMI) 40.0 and over, adult: Secondary | ICD-10-CM | POA: Diagnosis not present

## 2021-08-17 DIAGNOSIS — E1122 Type 2 diabetes mellitus with diabetic chronic kidney disease: Secondary | ICD-10-CM | POA: Diagnosis not present

## 2021-08-17 DIAGNOSIS — I129 Hypertensive chronic kidney disease with stage 1 through stage 4 chronic kidney disease, or unspecified chronic kidney disease: Secondary | ICD-10-CM | POA: Diagnosis not present

## 2021-09-07 DIAGNOSIS — E559 Vitamin D deficiency, unspecified: Secondary | ICD-10-CM | POA: Diagnosis not present

## 2021-09-07 DIAGNOSIS — E1169 Type 2 diabetes mellitus with other specified complication: Secondary | ICD-10-CM | POA: Diagnosis not present

## 2021-09-07 DIAGNOSIS — D509 Iron deficiency anemia, unspecified: Secondary | ICD-10-CM | POA: Diagnosis not present

## 2021-09-07 DIAGNOSIS — E782 Mixed hyperlipidemia: Secondary | ICD-10-CM | POA: Diagnosis not present

## 2021-09-14 DIAGNOSIS — N1831 Chronic kidney disease, stage 3a: Secondary | ICD-10-CM | POA: Diagnosis not present

## 2021-09-14 DIAGNOSIS — E782 Mixed hyperlipidemia: Secondary | ICD-10-CM | POA: Diagnosis not present

## 2021-09-14 DIAGNOSIS — E875 Hyperkalemia: Secondary | ICD-10-CM | POA: Diagnosis not present

## 2021-09-14 DIAGNOSIS — E538 Deficiency of other specified B group vitamins: Secondary | ICD-10-CM | POA: Diagnosis not present

## 2021-09-14 DIAGNOSIS — R778 Other specified abnormalities of plasma proteins: Secondary | ICD-10-CM | POA: Diagnosis not present

## 2021-09-14 DIAGNOSIS — I10 Essential (primary) hypertension: Secondary | ICD-10-CM | POA: Diagnosis not present

## 2021-09-14 DIAGNOSIS — C50412 Malignant neoplasm of upper-outer quadrant of left female breast: Secondary | ICD-10-CM | POA: Diagnosis not present

## 2021-09-14 DIAGNOSIS — E559 Vitamin D deficiency, unspecified: Secondary | ICD-10-CM | POA: Diagnosis not present

## 2021-09-14 DIAGNOSIS — E1169 Type 2 diabetes mellitus with other specified complication: Secondary | ICD-10-CM | POA: Diagnosis not present

## 2021-09-14 DIAGNOSIS — D509 Iron deficiency anemia, unspecified: Secondary | ICD-10-CM | POA: Diagnosis not present

## 2021-09-14 DIAGNOSIS — H3530 Unspecified macular degeneration: Secondary | ICD-10-CM | POA: Diagnosis not present

## 2021-10-26 ENCOUNTER — Ambulatory Visit: Payer: PPO | Admitting: Cardiology

## 2021-10-26 ENCOUNTER — Other Ambulatory Visit: Payer: Self-pay | Admitting: Hematology and Oncology

## 2021-11-09 DIAGNOSIS — C50919 Malignant neoplasm of unspecified site of unspecified female breast: Secondary | ICD-10-CM | POA: Diagnosis not present

## 2021-11-09 DIAGNOSIS — H353231 Exudative age-related macular degeneration, bilateral, with active choroidal neovascularization: Secondary | ICD-10-CM | POA: Diagnosis not present

## 2021-11-09 DIAGNOSIS — E1165 Type 2 diabetes mellitus with hyperglycemia: Secondary | ICD-10-CM | POA: Diagnosis not present

## 2021-11-09 DIAGNOSIS — I130 Hypertensive heart and chronic kidney disease with heart failure and stage 1 through stage 4 chronic kidney disease, or unspecified chronic kidney disease: Secondary | ICD-10-CM | POA: Diagnosis not present

## 2021-11-09 DIAGNOSIS — I4891 Unspecified atrial fibrillation: Secondary | ICD-10-CM | POA: Diagnosis not present

## 2021-11-09 DIAGNOSIS — E1169 Type 2 diabetes mellitus with other specified complication: Secondary | ICD-10-CM | POA: Diagnosis not present

## 2021-11-09 DIAGNOSIS — D6869 Other thrombophilia: Secondary | ICD-10-CM | POA: Diagnosis not present

## 2021-11-09 DIAGNOSIS — E1142 Type 2 diabetes mellitus with diabetic polyneuropathy: Secondary | ICD-10-CM | POA: Diagnosis not present

## 2021-11-09 DIAGNOSIS — E261 Secondary hyperaldosteronism: Secondary | ICD-10-CM | POA: Diagnosis not present

## 2021-11-09 DIAGNOSIS — N1831 Chronic kidney disease, stage 3a: Secondary | ICD-10-CM | POA: Diagnosis not present

## 2021-11-09 DIAGNOSIS — E1122 Type 2 diabetes mellitus with diabetic chronic kidney disease: Secondary | ICD-10-CM | POA: Diagnosis not present

## 2021-12-06 ENCOUNTER — Other Ambulatory Visit: Payer: Self-pay | Admitting: Dermatology

## 2021-12-06 DIAGNOSIS — Z1231 Encounter for screening mammogram for malignant neoplasm of breast: Secondary | ICD-10-CM

## 2021-12-19 DIAGNOSIS — E782 Mixed hyperlipidemia: Secondary | ICD-10-CM | POA: Diagnosis not present

## 2021-12-19 DIAGNOSIS — J019 Acute sinusitis, unspecified: Secondary | ICD-10-CM | POA: Diagnosis not present

## 2021-12-19 DIAGNOSIS — E559 Vitamin D deficiency, unspecified: Secondary | ICD-10-CM | POA: Diagnosis not present

## 2021-12-19 DIAGNOSIS — E1169 Type 2 diabetes mellitus with other specified complication: Secondary | ICD-10-CM | POA: Diagnosis not present

## 2021-12-19 DIAGNOSIS — D509 Iron deficiency anemia, unspecified: Secondary | ICD-10-CM | POA: Diagnosis not present

## 2021-12-20 DIAGNOSIS — E1122 Type 2 diabetes mellitus with diabetic chronic kidney disease: Secondary | ICD-10-CM | POA: Diagnosis not present

## 2021-12-20 DIAGNOSIS — N189 Chronic kidney disease, unspecified: Secondary | ICD-10-CM | POA: Diagnosis not present

## 2021-12-20 DIAGNOSIS — Z7689 Persons encountering health services in other specified circumstances: Secondary | ICD-10-CM | POA: Diagnosis not present

## 2021-12-20 DIAGNOSIS — I129 Hypertensive chronic kidney disease with stage 1 through stage 4 chronic kidney disease, or unspecified chronic kidney disease: Secondary | ICD-10-CM | POA: Diagnosis not present

## 2022-01-03 DIAGNOSIS — D509 Iron deficiency anemia, unspecified: Secondary | ICD-10-CM | POA: Diagnosis not present

## 2022-01-03 DIAGNOSIS — E782 Mixed hyperlipidemia: Secondary | ICD-10-CM | POA: Diagnosis not present

## 2022-01-03 DIAGNOSIS — N1831 Chronic kidney disease, stage 3a: Secondary | ICD-10-CM | POA: Diagnosis not present

## 2022-01-03 DIAGNOSIS — E559 Vitamin D deficiency, unspecified: Secondary | ICD-10-CM | POA: Diagnosis not present

## 2022-01-03 DIAGNOSIS — E875 Hyperkalemia: Secondary | ICD-10-CM | POA: Diagnosis not present

## 2022-01-03 DIAGNOSIS — E1169 Type 2 diabetes mellitus with other specified complication: Secondary | ICD-10-CM | POA: Diagnosis not present

## 2022-01-03 DIAGNOSIS — C50412 Malignant neoplasm of upper-outer quadrant of left female breast: Secondary | ICD-10-CM | POA: Diagnosis not present

## 2022-01-03 DIAGNOSIS — E538 Deficiency of other specified B group vitamins: Secondary | ICD-10-CM | POA: Diagnosis not present

## 2022-01-03 DIAGNOSIS — H3530 Unspecified macular degeneration: Secondary | ICD-10-CM | POA: Diagnosis not present

## 2022-01-03 DIAGNOSIS — R778 Other specified abnormalities of plasma proteins: Secondary | ICD-10-CM | POA: Diagnosis not present

## 2022-01-03 DIAGNOSIS — I10 Essential (primary) hypertension: Secondary | ICD-10-CM | POA: Diagnosis not present

## 2022-01-10 DIAGNOSIS — I129 Hypertensive chronic kidney disease with stage 1 through stage 4 chronic kidney disease, or unspecified chronic kidney disease: Secondary | ICD-10-CM | POA: Diagnosis not present

## 2022-01-10 DIAGNOSIS — E875 Hyperkalemia: Secondary | ICD-10-CM | POA: Diagnosis not present

## 2022-01-10 DIAGNOSIS — N189 Chronic kidney disease, unspecified: Secondary | ICD-10-CM | POA: Diagnosis not present

## 2022-01-10 DIAGNOSIS — E1122 Type 2 diabetes mellitus with diabetic chronic kidney disease: Secondary | ICD-10-CM | POA: Diagnosis not present

## 2022-01-11 DIAGNOSIS — M79676 Pain in unspecified toe(s): Secondary | ICD-10-CM | POA: Diagnosis not present

## 2022-01-11 DIAGNOSIS — E1142 Type 2 diabetes mellitus with diabetic polyneuropathy: Secondary | ICD-10-CM | POA: Diagnosis not present

## 2022-01-11 DIAGNOSIS — L84 Corns and callosities: Secondary | ICD-10-CM | POA: Diagnosis not present

## 2022-01-11 DIAGNOSIS — B351 Tinea unguium: Secondary | ICD-10-CM | POA: Diagnosis not present

## 2022-01-25 ENCOUNTER — Ambulatory Visit: Payer: PPO | Attending: Cardiology | Admitting: Cardiology

## 2022-01-25 ENCOUNTER — Encounter: Payer: Self-pay | Admitting: Cardiology

## 2022-01-25 VITALS — BP 124/76 | HR 77 | Ht 63.0 in | Wt 225.0 lb

## 2022-01-25 DIAGNOSIS — E782 Mixed hyperlipidemia: Secondary | ICD-10-CM | POA: Diagnosis not present

## 2022-01-25 DIAGNOSIS — R0609 Other forms of dyspnea: Secondary | ICD-10-CM | POA: Diagnosis not present

## 2022-01-25 DIAGNOSIS — I10 Essential (primary) hypertension: Secondary | ICD-10-CM

## 2022-01-25 NOTE — Progress Notes (Signed)
Clinical Summary Shelly [name] is a 81 y.o.female seen today for follow up of the following medical problems.      1. HTN - compliant with meds     2. Hyperlipidemia    09/2020 TC 160 TG 164 HDL 52 LDL 80  01/2021 TC 157 TG 221 HDL 42 LDL 78 -labs followed by pcp   3. Aortic sclerosis -05/2013 echo mean grade 10, AVA VTI 1.97 03/2021 echo mean grad 7, AVA VTI 2.34. No significant stenosis     4. CKD - followed by Dr Theador Hawthorne   5. DOE - some recent symptoms of DOE with short distances - no chest pains - has had some prior LE edema, controlled with lasix mg 61m qod - sedentary lifestule due to visual limitations, transporation issues - sister with CHF, mother with CHF.    2013 PFTs: moderate obstruction, diagnosed with asthma.  03/2021 echo: LVEF 60-65%, no WMAs, grade I DD, normal RV, no signficant valve pathology - chronic SOB. Has had recent URIs.   - breathing is improving, does better in cooler weather. Uses inhaler prn   6. Legally blind       SH: oldest daughter works at [employer]  Past Medical History:  Diagnosis Date   Arthritis    "qwhere" (07/01/2012)   Asthma    normal chest x-ray in 2010   Basal cell carcinoma of face    Breast cancer ([place]    Cholelithiasis    Chronic lower back pain    Degenerative joint disease    Right TKA-2010; low back pain; hands and hips also affected   Exertional shortness of breath    "mostly from being too fat" (07/01/2012)   GERD (gastroesophageal reflux disease)    history   H/O hiatal hernia    Heart murmur    small   Hemorrhoid    HTN (hypertension)    Hyperlipidemia    Macular degeneration    "both eyes" (07/01/2012)   PUD (peptic ulcer disease) 2003   Upper GI bleed-gastric ulcer; gastroesophageal reflux disease   Seasonal allergies    Type II diabetes mellitus (HCC)      Allergies  Allergen Reactions   Celebrex [Celecoxib] Other (See Comments)    Abdominal pain; history of  upper GI bleed    Claritin [Loratadine] Hives and Swelling    blisters   Penicillins Hives   Percocet [Oxycodone-Acetaminophen] Other (See Comments)    Severe constipation     Current Outpatient Medications  Medication Sig Dispense Refill   acetaminophen (TYLENOL) 325 MG tablet Take 650 mg by mouth every 6 (six) hours as needed.     albuterol (VENTOLIN HFA) 108 (90 Base) MCG/ACT inhaler Inhale 2 puffs into the lungs every 6 (six) hours as needed for shortness of breath.      amLODipine (NORVASC) 10 MG tablet Take 10 mg by mouth daily.       benazepril (LOTENSIN) 40 MG tablet Take 40 mg by mouth at bedtime.     carvedilol (COREG) 3.125 MG tablet Take 3.125 mg by mouth 2 (two) times daily. Dose reduction per patient     cetirizine (ZYRTEC) 10 MG tablet Take 10 mg by mouth daily.      Cholecalciferol (VITAMIN D PO) Take 1,000 Units by mouth.     Cyanocobalamin (B-12 COMPLIANCE INJECTION) 1000 MCG/ML KIT Inject 1 mL as directed daily.     cyclobenzaprine (FLEXERIL) 5 MG tablet Take 5 mg  by mouth daily as needed.     flintstones complete (FLINTSTONES) 60 MG chewable tablet Chew 1 tablet by mouth daily. (Patient not taking: Reported on 04/19/2021)     furosemide (LASIX) 20 MG tablet Take 1 tablet (20 mg total) by mouth every other day.     glipiZIDE (GLUCOTROL XL) 5 MG 24 hr tablet Take 5 mg by mouth every morning.      magnesium oxide (MAG-OX) 400 (240 Mg) MG tablet Take 1 tablet (400 mg total) by mouth daily. 30 tablet 3   metFORMIN (GLUCOPHAGE) 1000 MG tablet Take 1,000 mg by mouth 2 (two) times daily with a meal.     Multiple Vitamins-Minerals (PRESERVISION AREDS PO) Take 1 tablet by mouth 2 (two) times daily.      pravastatin (PRAVACHOL) 10 MG tablet Take 10 mg by mouth daily.     tamoxifen (NOLVADEX) 20 MG tablet TAKE 1 TABLET BY MOUTH ONCE A DAY. 90 tablet 0   Turmeric 450 MG CAPS Take 500 mg by mouth.      No current facility-administered medications for this visit.     Past  Surgical History:  Procedure Laterality Date   APPENDECTOMY     BREAST LUMPECTOMY Left    BREAST LUMPECTOMY WITH RADIOACTIVE SEED LOCALIZATION Left 10/21/2017   Procedure: BREAST LUMPECTOMY WITH RADIOACTIVE SEED LOCALIZATION;  Surgeon: Fanny Skates, MD;  Location: [place];  Service: General;  Laterality: Left;   CATARACT EXTRACTION BILATERAL W/ ANTERIOR VITRECTOMY Bilateral    EYE SURGERY Bilateral    "laser in eyes to stop bleeding and injections for macular degeneration" (07/01/2012)   FOOT SURGERY Bilateral    straighten 1st toe with a wedge.   KNEE ARTHROSCOPY Bilateral    SKIN CANCER EXCISION  2013   "face" (07/01/2012)   TOTAL KNEE ARTHROPLASTY Right 2011   TOTAL KNEE ARTHROPLASTY Left 07/01/2012   TOTAL KNEE ARTHROPLASTY Left 07/01/2012   Procedure: LEFT TOTAL KNEE ARTHROPLASTY;  Surgeon: Garald Balding, MD;  Location: [facility];  Service: Orthopedics;  Laterality: Left;  Left Total Knee Arthroplasty     Allergies  Allergen Reactions   Celebrex [Celecoxib] Other (See Comments)    Abdominal pain; history of upper GI bleed    Claritin [Loratadine] Hives and Swelling    blisters   Penicillins Hives   Percocet [Oxycodone-Acetaminophen] Other (See Comments)    Severe constipation      Family History  Problem Relation Age of Onset   Hodgkin's lymphoma Mother    Cervical cancer Sister        half sister mom's side   Lung cancer Sister    Breast cancer Neg Hx      Social History Shelly [name] reports that she quit smoking about 40 years ago. Her smoking use included cigarettes. She has a 67.50 pack-year smoking history. She has never used smokeless tobacco. Shelly [name] reports no history of alcohol use.   Review of Systems CONSTITUTIONAL: No weight loss, fever, chills, weakness or fatigue.  HEENT: Eyes: No visual loss, blurred vision, double vision or yellow sclerae.No hearing loss, sneezing, congestion, runny nose or sore throat.  SKIN: No rash or  itching.  CARDIOVASCULAR: per hpi RESPIRATORY: No shortness of breath, cough or sputum.  GASTROINTESTINAL: No anorexia, nausea, vomiting or diarrhea. No abdominal pain or blood.  GENITOURINARY: No burning on urination, no polyuria NEUROLOGICAL: No headache, dizziness, syncope, paralysis, ataxia, numbness or tingling in the extremities. No change in bowel or bladder control.  MUSCULOSKELETAL: No  muscle, back pain, joint pain or stiffness.  LYMPHATICS: No enlarged nodes. No history of splenectomy.  PSYCHIATRIC: No history of depression or anxiety.  ENDOCRINOLOGIC: No reports of sweating, cold or heat intolerance. No polyuria or polydipsia.  Marland Kitchen   Physical Examination Today's Vitals   01/25/22 1124  BP: 124/76  Pulse: 77  SpO2: 96%  Weight: 225 lb (102.1 kg)  Height: _0  (1.6 m)   Body mass index is 39.86 kg/m.  Gen: resting comfortably, no acute distress HEENT: no scleral icterus, pupils equal round and reactive, no palptable cervical adenopathy,  CV: RRR, 2/6 systolic murmur rusb, no jvd Resp: Clear to auscultation bilaterally GI: abdomen is soft, non-tender, non-distended, normal bowel sounds, no hepatosplenomegaly MSK: extremities are warm, no edema.  Skin: warm, no rash Neuro:  no focal deficits Psych: appropriate affect   Diagnostic Studies 05/2012 Carotid US IMPRESSION:  Minimal amount of bilateral intimal wall thickening, not definitely resulting in hemodynamically significant stenosis.     06/2012 Venous LE [place] No evidence of deep vein thrombosis involving the left lower extremity. - No evidence of Baker's cyst on the left. Other specific details can be found in the table(s) above. Prepared and Electronically Authenticated by     05/2013 echo Study Conclusions   - Left ventricle: The cavity size was normal. Wall thickness    was increased in a pattern of mild LVH. Systolic function    was normal. The estimated ejection fraction was in the    range of 60% to  65%. Wall motion was normal; there were no    regional wall motion abnormalities. Left ventricular    diastolic function parameters were normal.  - Aortic valve: Mildly calcified annulus. Trileaflet; mildly    thickened leaflets. Mildly increased transvalvular    velocity. Trivial regurgitation. Peak velocity:211cm/s    (S). Mean gradient:22m Hg (S). Valve area: 1.97cm^2(VTI).  - Mitral valve: Mild mitral annular calcification.  - Left atrium: The atrium was mildly dilated.      03/24/2021 echo  1. Left ventricular ejection fraction, by estimation, is 60 to 65%. The  left ventricle has normal function. The left ventricle has no regional  wall motion abnormalities. There is mild asymmetric left ventricular  hypertrophy of the basal-septal segment.  Left ventricular diastolic parameters are consistent with Grade I  diastolic dysfunction (impaired relaxation).   2. Right ventricular systolic function is normal. The right ventricular  size is normal. There is normal pulmonary artery systolic pressure.   3. The mitral valve is normal in structure. No evidence of mitral valve  regurgitation. No evidence of mitral stenosis.   4. The aortic valve is normal in structure. Aortic valve regurgitation is  not visualized. No aortic stenosis is present.   5. The inferior vena cava is normal in size with greater than 50%  respiratory variability, suggesting right atrial pressure of 3 mmHg.     Assessment and Plan   1. DOE -benign cardiac testing - likely multifactorial with asthma, obesity, deconditioning as activities are limited by severe vision deficit.  - has not had any chest pains, at this time would not plan for ischemic testing.  - continue to monitor at this time, reports symptosm improving since last visit   2.HTN - at goal, continue current meds  3. Hyperlipidemia - at goal, continue current meds - labs followed by pcp     JArnoldo Lenis M.D.

## 2022-01-25 NOTE — Patient Instructions (Signed)
Medication Instructions:  Your physician recommends that you continue on your current medications as directed. Please refer to the Current Medication list given to you today.   Labwork: None  Testing/Procedures: None  Follow-Up: Follow up with Dr. Branch in 6 months.   Any Other Special Instructions Will Be Listed Below (If Applicable).     If you need a refill on your cardiac medications before your next appointment, please call your pharmacy.  

## 2022-01-29 ENCOUNTER — Ambulatory Visit
Admission: RE | Admit: 2022-01-29 | Discharge: 2022-01-29 | Disposition: A | Discharge status: Home / Self Care | Payer: PPO | Source: Ambulatory Visit | Attending: Dermatology | Admitting: Dermatology

## 2022-01-29 DIAGNOSIS — Z1231 Encounter for screening mammogram for malignant neoplasm of breast: Secondary | ICD-10-CM

## 2022-01-30 DIAGNOSIS — E1122 Type 2 diabetes mellitus with diabetic chronic kidney disease: Secondary | ICD-10-CM | POA: Diagnosis not present

## 2022-01-30 DIAGNOSIS — I129 Hypertensive chronic kidney disease with stage 1 through stage 4 chronic kidney disease, or unspecified chronic kidney disease: Secondary | ICD-10-CM | POA: Diagnosis not present

## 2022-01-30 DIAGNOSIS — E875 Hyperkalemia: Secondary | ICD-10-CM | POA: Diagnosis not present

## 2022-01-30 DIAGNOSIS — N189 Chronic kidney disease, unspecified: Secondary | ICD-10-CM | POA: Diagnosis not present

## 2022-01-31 ENCOUNTER — Encounter: Payer: Self-pay | Admitting: Internal Medicine

## 2022-02-01 ENCOUNTER — Other Ambulatory Visit: Payer: Self-pay | Admitting: Internal Medicine

## 2022-02-01 DIAGNOSIS — R928 Other abnormal and inconclusive findings on diagnostic imaging of breast: Secondary | ICD-10-CM

## 2022-02-08 DIAGNOSIS — Z6841 Body Mass Index (BMI) 40.0 and over, adult: Secondary | ICD-10-CM | POA: Diagnosis not present

## 2022-02-08 DIAGNOSIS — N189 Chronic kidney disease, unspecified: Secondary | ICD-10-CM | POA: Diagnosis not present

## 2022-02-08 DIAGNOSIS — E1122 Type 2 diabetes mellitus with diabetic chronic kidney disease: Secondary | ICD-10-CM | POA: Diagnosis not present

## 2022-02-08 DIAGNOSIS — Z7689 Persons encountering health services in other specified circumstances: Secondary | ICD-10-CM | POA: Diagnosis not present

## 2022-02-08 DIAGNOSIS — I129 Hypertensive chronic kidney disease with stage 1 through stage 4 chronic kidney disease, or unspecified chronic kidney disease: Secondary | ICD-10-CM | POA: Diagnosis not present

## 2022-02-20 ENCOUNTER — Other Ambulatory Visit: Payer: PPO

## 2022-02-21 ENCOUNTER — Ambulatory Visit
Admission: RE | Admit: 2022-02-21 | Discharge: 2022-02-21 | Disposition: A | Discharge status: Home / Self Care | Payer: PPO | Source: Ambulatory Visit | Attending: Internal Medicine | Admitting: Internal Medicine

## 2022-02-21 DIAGNOSIS — Z853 Personal history of malignant neoplasm of breast: Secondary | ICD-10-CM | POA: Diagnosis not present

## 2022-02-21 DIAGNOSIS — R928 Other abnormal and inconclusive findings on diagnostic imaging of breast: Secondary | ICD-10-CM

## 2022-02-21 DIAGNOSIS — N6321 Unspecified lump in the left breast, upper outer quadrant: Secondary | ICD-10-CM | POA: Diagnosis not present

## 2022-03-12 NOTE — Progress Notes (Signed)
Patient Care Team: Celene Squibb, MD as PCP - General (Internal Medicine) Harl Bowie Alphonse Guild, MD as PCP - Cardiology (Cardiology) Garald Balding, MD (Orthopedic Surgery) Frederik Pear, MD (Orthopedic Surgery) Fanny Skates, MD as Consulting Physician (General Surgery) Nicholas Lose, MD as Consulting Physician (Hematology and Oncology) Kyung Rudd, MD as Consulting Physician (Radiation Oncology) Delice Bison Charlestine Massed, NP as Nurse Practitioner (Hematology and Oncology)  DIAGNOSIS: No diagnosis found.  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  MEDICATIONS:  Current Outpatient Medications  Medication Sig Dispense Refill   acetaminophen (TYLENOL) 325 MG tablet Take 650 mg by mouth every 6 (six) hours as needed.     albuterol (VENTOLIN HFA) 108 (90 Base) MCG/ACT inhaler Inhale 2 puffs into the lungs every 6 (six) hours as needed for shortness of breath.      amLODipine (NORVASC) 10 MG tablet Take 10 mg by mouth daily.       carvedilol (COREG) 3.125 MG tablet Take 3.125 mg by mouth 2 (two) times daily. Dose reduction per patient     cetirizine (ZYRTEC) 10 MG tablet Take 10 mg by mouth daily.       Cholecalciferol (VITAMIN D PO) Take 1,000 Units by mouth.     Cyanocobalamin (B-12 COMPLIANCE INJECTION) 1000 MCG/ML KIT Inject 1 mL as directed daily.     cyclobenzaprine (FLEXERIL) 5 MG tablet Take 5 mg by mouth daily as needed.     flintstones complete (FLINTSTONES) 60 MG chewable tablet Chew 1 tablet by mouth daily.     furosemide (LASIX) 20 MG tablet Take 1 tablet (20 mg total) by mouth every other day.     glipiZIDE (GLUCOTROL XL) 5 MG 24 hr tablet Take 10 mg by mouth every morning.     magnesium oxide (MAG-OX) 400 (240 Mg) MG tablet Take 1 tablet (400 mg total) by mouth daily. 30 tablet 3   Multiple Vitamins-Minerals (PRESERVISION AREDS PO) Take 1 tablet by mouth 2 (two) times daily.      pravastatin (PRAVACHOL) 10 MG tablet Take 10 mg by mouth daily.     tamoxifen (NOLVADEX) 20 MG tablet TAKE 1 TABLET BY MOUTH ONCE A DAY. (Patient taking differently: Take 10 mg by mouth every other day.) 90 tablet 0   Turmeric 450 MG CAPS Take 500 mg by mouth.      No current facility-administered medications for this visit.    PHYSICAL EXAMINATION: ECOG PERFORMANCE STATUS: {CHL ONC ECOG [phone]}  There were no vitals filed for this visit. There were no vitals filed for this visit.  BREAST:*** No palpable masses or nodules in either right or left breasts. No palpable axillary supraclavicular or infraclavicular adenopathy no breast tenderness or nipple discharge. (exam performed  in the presence of a chaperone)  LABORATORY DATA:  I have reviewed the data as listed    Latest Ref Rng & Units 09/25/2017   11:50 AM 07/03/2012    5:20 AM 07/02/2012    4:36 AM  CMP  Glucose 70 - 99 mg/dL 128  181  172   BUN 8 - 23 mg/dL '20  22  18   '$ Creatinine 0.44 - 1.00 mg/dL 0.86  1.06  0.73   Sodium 135 - 145 mmol/L 142  133  136   Potassium 3.5 - 5.1 mmol/L 4.4  4.4  3.9   Chloride 98 - 111 mmol/L 103  95  99   CO2 22 - 32 mmol/L '29  28  29   '$ Calcium 8.9 - 10.3 mg/dL 9.0  8.9  8.3   Total Protein 6.5 -  8.1 g/dL 6.6     Total Bilirubin 0.3 - 1.2 mg/dL 0.4     Alkaline Phos 38 - 126 U/L 64     AST 15 - 41 U/L 15     ALT 0 - 44 U/L 14       Lab Results  Component Value Date   WBC 5.5 09/25/2017   HGB 13.0 09/25/2017   HCT 40.7 09/25/2017   MCV 83.2 09/25/2017   PLT 285 09/25/2017   NEUTROABS 3.3 09/25/2017    ASSESSMENT & PLAN:  No problem-specific Assessment & Plan notes found for this encounter.    No orders of the defined types were placed in this encounter.  The patient has a good understanding of the overall plan. she agrees with it. she will call with any problems that may develop before the next visit here. Total time spent: 30 mins including face to face time and time spent for planning, charting and co-ordination of care   Suzzette Righter, [place] 03/12/22    I Gardiner Coins am acting as a Education administrator for [employer]  ***

## 2022-03-13 ENCOUNTER — Inpatient Hospital Stay: Payer: PPO | Attending: Hematology and Oncology | Admitting: Hematology and Oncology

## 2022-03-13 ENCOUNTER — Other Ambulatory Visit: Payer: Self-pay

## 2022-03-13 VITALS — BP 152/75 | HR 86 | Temp 97.3°F | Resp 16 | Wt 228.5 lb

## 2022-03-13 DIAGNOSIS — C50412 Malignant neoplasm of upper-outer quadrant of left female breast: Secondary | ICD-10-CM | POA: Diagnosis not present

## 2022-03-13 DIAGNOSIS — Z170 Estrogen receptor positive status [ER+]: Secondary | ICD-10-CM | POA: Insufficient documentation

## 2022-03-13 DIAGNOSIS — Z79810 Long term (current) use of selective estrogen receptor modulators (SERMs): Secondary | ICD-10-CM | POA: Diagnosis not present

## 2022-03-13 NOTE — Assessment & Plan Note (Addendum)
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   Breast cancer surveillance: 03/13/2022: Benign breast density category B 02/21/2022: Mammogram and ultrasound: Benign cyst UOQ left breast 0.5 cm, breast density category B Bone density 04/23/2018: Osteopenia, T score -1.6, recommended calcium and vitamin D     Return to clinic on an as-needed basis

## 2022-03-28 DIAGNOSIS — E119 Type 2 diabetes mellitus without complications: Secondary | ICD-10-CM | POA: Diagnosis not present

## 2022-03-29 DIAGNOSIS — M79676 Pain in unspecified toe(s): Secondary | ICD-10-CM | POA: Diagnosis not present

## 2022-03-29 DIAGNOSIS — B351 Tinea unguium: Secondary | ICD-10-CM | POA: Diagnosis not present

## 2022-03-29 DIAGNOSIS — L84 Corns and callosities: Secondary | ICD-10-CM | POA: Diagnosis not present

## 2022-04-11 DIAGNOSIS — E1169 Type 2 diabetes mellitus with other specified complication: Secondary | ICD-10-CM | POA: Diagnosis not present

## 2022-04-11 DIAGNOSIS — E559 Vitamin D deficiency, unspecified: Secondary | ICD-10-CM | POA: Diagnosis not present

## 2022-04-11 DIAGNOSIS — D509 Iron deficiency anemia, unspecified: Secondary | ICD-10-CM | POA: Diagnosis not present

## 2022-04-11 DIAGNOSIS — E782 Mixed hyperlipidemia: Secondary | ICD-10-CM | POA: Diagnosis not present

## 2022-04-16 ENCOUNTER — Encounter: Payer: Self-pay | Admitting: Family Medicine

## 2022-04-16 ENCOUNTER — Ambulatory Visit (INDEPENDENT_AMBULATORY_CARE_PROVIDER_SITE_OTHER): Payer: PPO | Admitting: Family Medicine

## 2022-04-16 VITALS — BP 135/80 | HR 90 | Ht 63.0 in | Wt 230.0 lb

## 2022-04-16 DIAGNOSIS — N183 Chronic kidney disease, stage 3 unspecified: Secondary | ICD-10-CM | POA: Insufficient documentation

## 2022-04-16 DIAGNOSIS — N1832 Chronic kidney disease, stage 3b: Secondary | ICD-10-CM | POA: Diagnosis not present

## 2022-04-16 DIAGNOSIS — I10 Essential (primary) hypertension: Secondary | ICD-10-CM | POA: Diagnosis not present

## 2022-04-16 DIAGNOSIS — H548 Legal blindness, as defined in USA: Secondary | ICD-10-CM | POA: Insufficient documentation

## 2022-04-16 DIAGNOSIS — E538 Deficiency of other specified B group vitamins: Secondary | ICD-10-CM | POA: Diagnosis not present

## 2022-04-16 DIAGNOSIS — E785 Hyperlipidemia, unspecified: Secondary | ICD-10-CM | POA: Diagnosis not present

## 2022-04-16 DIAGNOSIS — E1122 Type 2 diabetes mellitus with diabetic chronic kidney disease: Secondary | ICD-10-CM

## 2022-04-16 NOTE — Patient Instructions (Signed)
We will call with lab results.  Follow up in 3 months.

## 2022-04-17 DIAGNOSIS — E538 Deficiency of other specified B group vitamins: Secondary | ICD-10-CM | POA: Insufficient documentation

## 2022-04-17 LAB — CMP14+EGFR
ALT: 11 IU/L (ref 0–32)
AST: 16 IU/L (ref 0–40)
Albumin/Globulin Ratio: 1.9 (ref 1.2–2.2)
Albumin: 4.3 g/dL (ref 3.8–4.8)
Alkaline Phosphatase: 140 IU/L — ABNORMAL HIGH (ref 44–121)
BUN/Creatinine Ratio: 21 (ref 12–28)
BUN: 26 mg/dL (ref 8–27)
Bilirubin Total: 0.2 mg/dL (ref 0.0–1.2)
CO2: 28 mmol/L (ref 20–29)
Calcium: 9.1 mg/dL (ref 8.7–10.3)
Chloride: 99 mmol/L (ref 96–106)
Creatinine, Ser: 1.24 mg/dL — ABNORMAL HIGH (ref 0.57–1.00)
Globulin, Total: 2.3 g/dL (ref 1.5–4.5)
Glucose: 214 mg/dL — ABNORMAL HIGH (ref 70–99)
Potassium: 5 mmol/L (ref 3.5–5.2)
Sodium: 139 mmol/L (ref 134–144)
Total Protein: 6.6 g/dL (ref 6.0–8.5)
eGFR: 44 mL/min/{1.73_m2} — ABNORMAL LOW (ref >59)

## 2022-04-17 LAB — MICROALBUMIN / CREATININE URINE RATIO
Creatinine, Urine: 23.9 mg/dL
Microalb/Creat Ratio: 57 mg/g creat — ABNORMAL HIGH (ref 0–29)
Microalbumin, Urine: 13.7 ug/mL

## 2022-04-17 LAB — HEMOGLOBIN A1C
Est. average glucose Bld gHb Est-mCnc: 192 mg/dL
Hgb A1c MFr Bld: 8.3 % — ABNORMAL HIGH (ref 4.8–5.6)

## 2022-04-17 LAB — CBC
Hematocrit: 43.5 % (ref 34.0–46.6)
Hemoglobin: 13.5 g/dL (ref 11.1–15.9)
MCH: 26.1 pg — ABNORMAL LOW (ref 26.6–33.0)
MCHC: 31 g/dL — ABNORMAL LOW (ref 31.5–35.7)
MCV: 84 fL (ref 79–97)
Platelets: 272 10*3/uL (ref 150–450)
RBC: 5.18 x10E6/uL (ref 3.77–5.28)
RDW: 13.3 % (ref 11.7–15.4)
WBC: 6.3 10*3/uL (ref 3.4–10.8)

## 2022-04-17 LAB — LIPID PANEL
Chol/HDL Ratio: 3.3 ratio (ref 0.0–4.4)
Cholesterol, Total: 156 mg/dL (ref 100–199)
HDL: 47 mg/dL (ref >39)
LDL Chol Calc (NIH): 58 mg/dL (ref 0–99)
Triglycerides: 330 mg/dL — ABNORMAL HIGH (ref 0–149)
VLDL Cholesterol Cal: 51 mg/dL — ABNORMAL HIGH (ref 5–40)

## 2022-04-17 LAB — VITAMIN B12: Vitamin B-12: 478 pg/mL (ref 232–1245)

## 2022-04-17 MED ORDER — B-12 COMPLIANCE INJECTION 1000 MCG/ML IJ KIT: 1.0000 mL | PACK | INTRAMUSCULAR | 4 refills | Status: DC

## 2022-04-17 NOTE — Assessment & Plan Note (Signed)
Patient's diabetes is uncontrolled.  A1c returned at 8.3.  We discussed medications options regarding her diabetes.  Will continue glipizide.  Not restarting metformin due to renal function.  Will add Trulicity if patient is amenable.

## 2022-04-17 NOTE — Assessment & Plan Note (Signed)
Stable.  Continue amlodipine, carvedilol, and Lasix.

## 2022-04-17 NOTE — Progress Notes (Signed)
Subjective:  Patient ID: Shelly [name], female    DOB: March 14, 1940  Age: 82 y.o. MRN: [id number]  CC: Chief Complaint  Patient presents with   Establish Care    Arthritis and b12 injection for at home injections    HPI:  82 year old female with the below mentioned medical problems presents to establish care.  Hypertension is well-controlled on amlodipine, carvedilol, and Lasix.  No recent A1c.  Patient has numerous readings on her glucometer which were quite high in the 270s.  She is currently on glipizide 10 mg every morning.  No current lipid panel.  She is currently on pravastatin regarding her lipids.  Patient follows with hematology/oncology regarding history of breast cancer.  She is now off tamoxifen.  Patient follows closely with nephrology as she has CKD stage III.   Patient Active Problem List   Diagnosis Date Noted   B12 deficiency 04/17/2022   CKD (chronic kidney disease) stage 3, GFR 30-59 ml/min ([place]) 04/16/2022   Legally blind 04/16/2022   Malignant neoplasm of upper-outer quadrant of left breast in female, estrogen receptor positive ([place]) 09/18/2017   Osteoarthritis of left knee 07/03/2012   Hyperlipidemia    Diabetes mellitus ([place])    PUD (peptic ulcer disease)    Degenerative joint disease    Essential hypertension 12/07/2008    Social Hx   Social History   Socioeconomic History   Marital status: Divorced    Spouse name: Not on file   Number of children: 2   Years of education: Not on file   Highest education level: Not on file  Occupational History   Not on file  Tobacco Use   Smoking status: Former    Packs/day: 2.50    Years: 27.00    Total pack years: 67.50    Types: Cigarettes    Quit date: 02/12/1981    Years since quitting: 41.2   Smokeless tobacco: Never  Vaping Use   Vaping Use: Never used  Substance and Sexual Activity   Alcohol use: No   Drug use: No   Sexual activity: Not Currently  Other Topics Concern   Not on file   Social History Narrative   Lives locally. Works part time at the [employer].    Social Determinants of Health   Financial Resource Strain: Not on file  Food Insecurity: Not on file  Transportation Needs: Not on file  Physical Activity: Not on file  Stress: Not on file  Social Connections: Not on file    Review of Systems  Constitutional: Negative.   Musculoskeletal:  Positive for arthralgias.   Objective:  BP 135/80   Pulse 90   Ht '5\' 3"'$  (1.6 m)   Wt 230 lb (104.3 kg)   SpO2 95%   BMI 40.74 kg/m      04/16/2022    2:56 PM 03/13/2022    2:55 PM 01/25/2022   11:24 AM  BP/Weight  Systolic BP A999333 [id number]  Diastolic BP 80 75 76  Wt. (Lbs) 230 228.5 225  BMI 40.74 kg/m2 40.48 kg/m2 39.86 kg/m2    Physical Exam Vitals and nursing note reviewed.  Constitutional:      General: She is not in acute distress.    Appearance: Normal appearance.  HENT:     Head: Normocephalic and atraumatic.  Eyes:     General:        Right eye: No discharge.        Left eye: No discharge.  Conjunctiva/sclera: Conjunctivae normal.  Cardiovascular:     Rate and Rhythm: Normal rate and regular rhythm.  Pulmonary:     Effort: Pulmonary effort is normal.     Breath sounds: Normal breath sounds. No wheezing, rhonchi or rales.  Neurological:     Mental Status: She is alert.  Psychiatric:        Mood and Affect: Mood normal.        Behavior: Behavior normal.     Lab Results  Component Value Date   WBC 6.3 04/16/2022   HGB 13.5 04/16/2022   HCT 43.5 04/16/2022   PLT 272 04/16/2022   GLUCOSE 214 (H) 04/16/2022   CHOL 156 04/16/2022   TRIG 330 (H) 04/16/2022   HDL 47 04/16/2022   LDLCALC 58 04/16/2022   ALT 11 04/16/2022   AST 16 04/16/2022   NA 139 04/16/2022   K 5.0 04/16/2022   CL 99 04/16/2022   CREATININE 1.24 (H) 04/16/2022   BUN 26 04/16/2022   CO2 28 04/16/2022   INR 0.98 06/25/2012   HGBA1C 8.3 (H) 04/16/2022     Assessment & Plan:    Problem List Items Addressed This Visit       Cardiovascular and Mediastinum   Essential hypertension    Stable.  Continue amlodipine, carvedilol, and Lasix.          Endocrine   Diabetes mellitus ([place]) - Primary    Patient's diabetes is uncontrolled.  A1c returned at 8.3.  We discussed medications options regarding her diabetes.  Will continue glipizide.  Not restarting metformin due to renal function.  Will add Trulicity if patient is amenable.      Relevant Orders   CBC (Completed)   CMP14+EGFR (Completed)   Hemoglobin A1c (Completed)   Microalbumin / creatinine urine ratio (Completed)     Other   B12 deficiency    B12 refilled.      Relevant Orders   Vitamin B12 (Completed)   Hyperlipidemia    LDL at goal.  Continue pravastatin.      Relevant Orders   Lipid panel (Completed)    Meds ordered this encounter  Medications   Cyanocobalamin (B-12 COMPLIANCE INJECTION) 1000 MCG/ML KIT    Sig: Inject 1 mL as directed every 30 (thirty) days.    Dispense:  3 kit    Refill:  4    Follow-up:  3 months  [place] DO [place]

## 2022-04-17 NOTE — Assessment & Plan Note (Signed)
LDL at goal.  Continue pravastatin.

## 2022-04-17 NOTE — Assessment & Plan Note (Signed)
B12 refilled.

## 2022-04-22 ENCOUNTER — Other Ambulatory Visit: Payer: Self-pay | Admitting: Family Medicine

## 2022-04-22 MED ORDER — TRULICITY 0.75 MG/0.5ML ~~LOC~~ SOAJ: 0.7500 mg | SUBCUTANEOUS | 0 refills | Status: DC

## 2022-05-10 DIAGNOSIS — E1122 Type 2 diabetes mellitus with diabetic chronic kidney disease: Secondary | ICD-10-CM | POA: Diagnosis not present

## 2022-05-10 DIAGNOSIS — Z7689 Persons encountering health services in other specified circumstances: Secondary | ICD-10-CM | POA: Diagnosis not present

## 2022-05-10 DIAGNOSIS — Z6841 Body Mass Index (BMI) 40.0 and over, adult: Secondary | ICD-10-CM | POA: Diagnosis not present

## 2022-05-10 DIAGNOSIS — I129 Hypertensive chronic kidney disease with stage 1 through stage 4 chronic kidney disease, or unspecified chronic kidney disease: Secondary | ICD-10-CM | POA: Diagnosis not present

## 2022-05-10 DIAGNOSIS — N189 Chronic kidney disease, unspecified: Secondary | ICD-10-CM | POA: Diagnosis not present

## 2022-05-17 DIAGNOSIS — E875 Hyperkalemia: Secondary | ICD-10-CM | POA: Diagnosis not present

## 2022-05-17 DIAGNOSIS — Z7689 Persons encountering health services in other specified circumstances: Secondary | ICD-10-CM | POA: Diagnosis not present

## 2022-05-17 DIAGNOSIS — N189 Chronic kidney disease, unspecified: Secondary | ICD-10-CM | POA: Diagnosis not present

## 2022-05-17 DIAGNOSIS — Z6841 Body Mass Index (BMI) 40.0 and over, adult: Secondary | ICD-10-CM | POA: Diagnosis not present

## 2022-05-17 DIAGNOSIS — I129 Hypertensive chronic kidney disease with stage 1 through stage 4 chronic kidney disease, or unspecified chronic kidney disease: Secondary | ICD-10-CM | POA: Diagnosis not present

## 2022-05-17 DIAGNOSIS — E1122 Type 2 diabetes mellitus with diabetic chronic kidney disease: Secondary | ICD-10-CM | POA: Diagnosis not present

## 2022-05-21 ENCOUNTER — Ambulatory Visit
Admission: EM | Admit: 2022-05-21 | Discharge: 2022-05-21 | Disposition: A | Discharge status: Home / Self Care | Payer: PPO | Attending: Physician Assistant | Admitting: Physician Assistant

## 2022-05-21 ENCOUNTER — Ambulatory Visit (INDEPENDENT_AMBULATORY_CARE_PROVIDER_SITE_OTHER): Payer: PPO

## 2022-05-21 DIAGNOSIS — S8002XA Contusion of left knee, initial encounter: Secondary | ICD-10-CM | POA: Diagnosis not present

## 2022-05-21 DIAGNOSIS — S8012XA Contusion of left lower leg, initial encounter: Secondary | ICD-10-CM | POA: Diagnosis not present

## 2022-05-21 DIAGNOSIS — Z96652 Presence of left artificial knee joint: Secondary | ICD-10-CM | POA: Diagnosis not present

## 2022-05-21 DIAGNOSIS — M25562 Pain in left knee: Secondary | ICD-10-CM

## 2022-05-21 DIAGNOSIS — W19XXXA Unspecified fall, initial encounter: Secondary | ICD-10-CM

## 2022-05-21 MED ORDER — LIDOCAINE 5 % EX OINT: 1.0000 | TOPICAL_OINTMENT | Freq: Three times a day (TID) | CUTANEOUS | 0 refills | Status: DC | PRN

## 2022-05-21 NOTE — ED Provider Notes (Signed)
[facility]    CSN: [id number] Arrival date & time: 05/21/22  1646      History   Chief Complaint Chief Complaint  Patient presents with   Knee Pain    HPI Shelly [name] is a 82 y.o. female.   Patient presents today companied by her daughter who provides majority of history.  Reports that yesterday she fell in her house.  She had difficulty getting up on her own and had to scoot around the house in order to get to a phone and called family members who are able to assist her up.  She reports that she did not hit her head and denies any associated loss of consciousness, nausea, vomiting, headache, dizziness, confusion, amnesia surrounding event.  She does not take blood thinning medication.  Her primary concern today is ongoing left knee pain from the fall.  Reports that 3 weeks ago she had a similar fall but this was caught by her daughter.  She feels that she might of had ongoing pain since that time as she is asked for a bedside commode in order to limit ambulation throughout her home.  She is status post bilateral total knee replacement with the left being performed in 2014.  She reports that pain is minimal at rest but increases significantly to a 9 when she attempts to ambulate, described as sharp, localized to inferior joint line with radiation into her anterior leg, no alleviating factors identified.  She has been taking Tylenol and Flexeril without improvement of symptoms.    Past Medical History:  Diagnosis Date   Arthritis    "qwhere" (07/01/2012)   Asthma    normal chest x-ray in 2010   Basal cell carcinoma of face    Breast cancer    Cholelithiasis    Chronic lower back pain    Degenerative joint disease    Right TKA-2010; low back pain; hands and hips also affected   Exertional shortness of breath    "mostly from being too fat" (07/01/2012)   GERD (gastroesophageal reflux disease)    history   H/O hiatal hernia    Heart murmur    small   Hemorrhoid     HTN (hypertension)    Hyperlipidemia    Macular degeneration    "both eyes" (07/01/2012)   PUD (peptic ulcer disease) 2003   Upper GI bleed-gastric ulcer; gastroesophageal reflux disease   Seasonal allergies    Type II diabetes mellitus     Patient Active Problem List   Diagnosis Date Noted   B12 deficiency 04/17/2022   CKD (chronic kidney disease) stage 3, GFR 30-59 ml/min 04/16/2022   Legally blind 04/16/2022   Malignant neoplasm of upper-outer quadrant of left breast in female, estrogen receptor positive 09/18/2017   Osteoarthritis of left knee 07/03/2012   Hyperlipidemia    Diabetes mellitus    PUD (peptic ulcer disease)    Degenerative joint disease    Essential hypertension 12/07/2008    Past Surgical History:  Procedure Laterality Date   APPENDECTOMY     BREAST LUMPECTOMY Left    BREAST LUMPECTOMY WITH RADIOACTIVE SEED LOCALIZATION Left 10/21/2017   Procedure: BREAST LUMPECTOMY WITH RADIOACTIVE SEED LOCALIZATION;  Surgeon: Claud Kelp, MD;  Location: [facility];  Service: General;  Laterality: Left;   CATARACT EXTRACTION BILATERAL W/ ANTERIOR VITRECTOMY Bilateral    EYE SURGERY Bilateral    "laser in eyes to stop bleeding and injections for macular degeneration" (07/01/2012)   FOOT SURGERY Bilateral  straighten 1st toe with a wedge.   KNEE ARTHROSCOPY Bilateral    SKIN CANCER EXCISION  2013   "face" (07/01/2012)   TOTAL KNEE ARTHROPLASTY Right 2011   TOTAL KNEE ARTHROPLASTY Left 07/01/2012   TOTAL KNEE ARTHROPLASTY Left 07/01/2012   Procedure: LEFT TOTAL KNEE ARTHROPLASTY;  Surgeon: Valeria BatmanPeter W Whitfield, MD;  [facility];  Service: Orthopedics;  Laterality: Left;  Left Total Knee Arthroplasty    OB History   No obstetric history on file.      Home Medications    Prior to Admission medications   Medication Sig Start Date End Date Taking? Authorizing Provider  acetaminophen (TYLENOL) 325 MG tablet Take 650 mg by mouth every 6 (six) hours as  needed.   Yes [provider]  albuterol (VENTOLIN HFA) 108 (90 Base) MCG/ACT inhaler Inhale 2 puffs into the lungs every 6 (six) hours as needed for shortness of breath.    Yes [provider]  amLODipine (NORVASC) 10 MG tablet Take 10 mg by mouth daily.     Yes [provider]  carvedilol (COREG) 3.125 MG tablet Take 3.125 mg by mouth 2 (two) times daily. Dose reduction per patient   Yes [provider]  cetirizine (ZYRTEC) 10 MG tablet Take 10 mg by mouth daily.    Yes [provider]  Cholecalciferol (VITAMIN D PO) Take 1,000 Units by mouth.   Yes [provider]  Cyanocobalamin (B-12 COMPLIANCE INJECTION) 1000 MCG/ML KIT Inject 1 mL as directed every 30 (thirty) days. 04/17/22  Yes Cook, Jayce G, DO  cyclobenzaprine (FLEXERIL) 5 MG tablet Take 5 mg by mouth daily as needed.   Yes [provider]  flintstones complete (FLINTSTONES) 60 MG chewable tablet Chew 1 tablet by mouth daily.   Yes [provider]  glipiZIDE (GLUCOTROL XL) 5 MG 24 hr tablet Take 10 mg by mouth every morning.   Yes [provider]  lidocaine (XYLOCAINE) 5 % ointment Apply 1 Application topically 3 (three) times daily as needed. 05/21/22  Yes Kaushal Vannice K, PA-C  magnesium oxide (MAG-OX) 400 (240 Mg) MG tablet Take 1 tablet (400 mg total) by mouth daily. 08/09/20  Yes Serena CroissantGudena, Vinay, MD  Multiple Vitamins-Minerals (PRESERVISION AREDS PO) Take 1 tablet by mouth 2 (two) times daily.    Yes [provider]  pravastatin (PRAVACHOL) 10 MG tablet Take 10 mg by mouth daily.   Yes [provider]  Turmeric 450 MG CAPS Take 500 mg by mouth.    Yes [provider]  furosemide (LASIX) 20 MG tablet Take 1 tablet (20 mg total) by mouth every other day. 12/26/20   Antoine PocheBranch, Jonathan F, MD    Family History Family History  Problem Relation Age of Onset   Hodgkin's lymphoma Mother    Cervical cancer Sister        half sister mom's  side   Lung cancer Sister    Breast cancer Neg Hx     Social History Social History   Tobacco Use   Smoking status: Former    Packs/day: 2.50    Years: 27.00    Additional pack years: 0.00    Total pack years: 67.50    Types: Cigarettes    Quit date: 02/12/1981    Years since quitting: 41.2   Smokeless tobacco: Never  Vaping Use   Vaping Use: Never used  Substance Use Topics   Alcohol use: No   Drug use: No     Allergies  Celebrex [celecoxib], Claritin [loratadine], Penicillins, and Percocet [oxycodone-acetaminophen]   Review of Systems Review of Systems  Constitutional:  Positive for activity change. Negative for appetite change, fatigue and fever.  Eyes:  Negative for visual disturbance.  Musculoskeletal:  Positive for arthralgias, gait problem and joint swelling. Negative for myalgias.  Skin:  Negative for color change and wound.  Neurological:  Negative for dizziness, seizures, syncope, speech difficulty, weakness, light-headedness, numbness and headaches.     Physical Exam Triage Vital Signs ED Triage Vitals  Enc Vitals Group     BP 05/21/22 1755 124/73     Pulse Rate 05/21/22 1755 91     Resp 05/21/22 1755 18     Temp 05/21/22 1755 98.7 F (37.1 C)     Temp Source 05/21/22 1755 Oral     SpO2 05/21/22 1755 92 %     Weight --      Height --      Head Circumference --      Peak Flow --      Pain Score 05/21/22 1756 9     Pain Loc --      Pain Edu? --      Excl. in GC? --    No data found.  Updated Vital Signs BP 124/73 (BP Location: Right Arm)   Pulse 91   Temp 98.7 F (37.1 C) (Oral)   Resp 18   SpO2 92%   Visual Acuity Right Eye Distance:   Left Eye Distance:   Bilateral Distance:    Right Eye Near:   Left Eye Near:    Bilateral Near:     Physical Exam Vitals reviewed.  Constitutional:      General: She is awake. She is not in acute distress.    Appearance: Normal appearance. She is well-developed. She is not ill-appearing.      Comments: Very pleasant female appears stated age in no acute distress sitting comfortably in exam room in a wheelchair  HENT:     Head: Normocephalic and atraumatic.  Cardiovascular:     Rate and Rhythm: Normal rate and regular rhythm.     Heart sounds: S1 normal and S2 normal. Murmur heard.  Pulmonary:     Effort: Pulmonary effort is normal.     Breath sounds: Normal breath sounds. No wheezing, rhonchi or rales.     Comments: Clear to auscultation bilaterally Abdominal:     Palpations: Abdomen is soft.     Tenderness: There is no abdominal tenderness.  Musculoskeletal:     Left knee: Swelling present. Decreased range of motion. Tenderness present over the medial joint line and lateral joint line. No LCL laxity, MCL laxity, ACL laxity or PCL laxity.    Right lower leg: 1+ Edema present.     Left lower leg: 1+ Edema present.     Comments: Left knee: Noticeable swelling noted anterior left knee.  Decreased range of motion with flexion and extension secondary to pain.  Pain at inferior joint line without deformity.  No ligamentous laxity on exam.  Psychiatric:        Behavior: Behavior is cooperative.      UC Treatments / Results  Labs (all labs ordered are listed, but only abnormal results are displayed) Labs Reviewed - No data to display  EKG   Radiology DG Knee Complete 4 Views Left  Result Date: 05/21/2022 CLINICAL DATA:  Pain after fall EXAM: LEFT KNEE - COMPLETE 4+ VIEW COMPARISON:  None Available. FINDINGS: Left knee replacement. No  fracture or malalignment. Lateral view limited by positioning. IMPRESSION: Left knee replacement. No acute osseous abnormality. Electronically Signed   By: Jasmine Pang M.D.   On: 05/21/2022 19:01    Procedures Procedures (including critical care time)  Medications Ordered in UC Medications - No data to display  Initial Impression / Assessment and Plan / UC Course  I have reviewed the triage vital signs and the nursing notes.  Pertinent  labs & imaging results that were available during my care of the patient were reviewed by me and considered in my medical decision making (see chart for details).     Show no acute osseous abnormality.  No obvious ligamentous injury on exam.  Patient was encouraged to use respiratory call to help manage symptoms.  She was prescribed viscous lidocaine to apply to her knee and can use previously prescribed Flexeril as well as Tylenol to manage her symptoms.  We discussed fall prevention strategies.  Recommend she follow-up closely with orthopedics; she was given contact information for Ortho care in [place] with instruction to call first thing tomorrow to schedule an appointment.  If she has any worsening or changing symptoms she is to go to the emergency room for further evaluation and management.  All questions answered to patient and caregiver satisfaction.  Final Clinical Impressions(s) / UC Diagnoses   Final diagnoses:  Contusion of left knee and lower leg, initial encounter  Acute pain of left knee  Status post total left knee replacement     Discharge Instructions      Your x-rays were normal with no evidence of fracture or dislocation.  I am concerned that you have a contusion or significant bruising causing your symptoms.  Keep your leg elevated and use ice for symptom relief.  We have placed you in a brace for comfort and support.  I would like you to follow-up with orthopedics soon as possible; call to schedule an appointment.  Continue your Tylenol as well as previously prescribed Flexeril for pain relief.  I have also called in topical lidocaine to help with your symptoms.  If anything worsens or changes you need to be seen immediately.     ED Prescriptions     Medication Sig Dispense Auth. Provider   lidocaine (XYLOCAINE) 5 % ointment Apply 1 Application topically 3 (three) times daily as needed. 50 g Basheer Molchan K, PA-C      PDMP not reviewed this encounter.   Jeani Hawking, PA-C 05/21/22 1916

## 2022-05-21 NOTE — ED Triage Notes (Signed)
Pt fell last night at home and is now having pain in her left knee. Took 2 flexeril and tylenol.

## 2022-05-21 NOTE — Discharge Instructions (Signed)
Your x-rays were normal with no evidence of fracture or dislocation.  I am concerned that you have a contusion or significant bruising causing your symptoms.  Keep your leg elevated and use ice for symptom relief.  We have placed you in a brace for comfort and support.  I would like you to follow-up with orthopedics soon as possible; call to schedule an appointment.  Continue your Tylenol as well as previously prescribed Flexeril for pain relief.  I have also called in topical lidocaine to help with your symptoms.  If anything worsens or changes you need to be seen immediately.

## 2022-05-30 ENCOUNTER — Encounter: Payer: Self-pay | Admitting: Orthopedic Surgery

## 2022-05-30 ENCOUNTER — Other Ambulatory Visit (INDEPENDENT_AMBULATORY_CARE_PROVIDER_SITE_OTHER): Payer: PPO

## 2022-05-30 ENCOUNTER — Ambulatory Visit (INDEPENDENT_AMBULATORY_CARE_PROVIDER_SITE_OTHER): Payer: PPO | Admitting: Orthopedic Surgery

## 2022-05-30 VITALS — BP 153/79 | HR 96

## 2022-05-30 DIAGNOSIS — M19012 Primary osteoarthritis, left shoulder: Secondary | ICD-10-CM

## 2022-05-30 DIAGNOSIS — G8929 Other chronic pain: Secondary | ICD-10-CM

## 2022-05-30 NOTE — Progress Notes (Signed)
New Patient Visit  Assessment: Shelly [name] is a 82 y.o. female with the following: 1. Glenohumeral arthritis, left  Plan: [name] schedule an appointment for evaluation of her left knee.  She sustained a fall recently.  She has a history of left total knee arthroplasty, and was concerned.  However, she is not complaining of any knee pain today.  Radiographs of the knee were reviewed, and there were no acute injuries.  Left shoulder has been more problematic.  She has pain throughout left shoulder, extending across the chest to the [facility] joint.  Radiographs of the left shoulder demonstrates advanced degenerative changes, with bone-on-bone articulation, and flattening of the humeral head, with large inferior osteophytes.  This was discussed with the patient.  She is not a good candidate for surgery due to multiple medical issues, recent falls, and history of legal blindness.  We discussed proceeding with an injection, and this was completed in clinic today.  She will follow-up as needed.   Procedure note injection Left shoulder    Verbal consent was obtained to inject the left shoulder, subacromial space Timeout was completed to confirm the site of injection.  The skin was prepped with alcohol and ethyl chloride was sprayed at the injection site.  A 21-gauge needle was used to inject 40 mg of Depo-Medrol and 1% lidocaine (3 cc) into the subacromial space of the left shoulder using a posterolateral approach.  There were no complications. A sterile bandage was applied.   Follow-up: Return if symptoms worsen or fail to improve.  Subjective:  Chief Complaint  Patient presents with   Knee Pain    L knee pain pt has had multiple falls in the past month. Not bothering her anymore though.    Shoulder Pain    L shoulder pain, has had multiple falls but family states she's just started complaining about it in the last couple of days.     History of Present Illness: Shelly [name] is  a 82 y.o. female who presents for evaluation of left knee pain.  She has a history of left total knee arthroplasty, and sustained a fall just a few days ago.  At that point, she was complaining of knee pain.  Radiographs were taken at an urgent care, and these were negative for acute injury.  Since then, the left knee is improved.  However, she continues to complain of left shoulder pain.  She has no history of left glenohumeral arthritis.  Dr. Cleophas Dunker followed her for this.  He provided her with some injections, but recommended against surgery.  She has not had injections in the left shoulder in several years.  She is complaining of left shoulder pain throughout, extending onto her chest.  She has limited motion of her left shoulder.  She takes Tylenol on a regular basis, with occasional ibuprofen.   Review of Systems: No fevers or chills No numbness or tingling No chest pain No shortness of breath No bowel or bladder dysfunction No GI distress No headaches   Medical History:  Past Medical History:  Diagnosis Date   Arthritis    "qwhere" (07/01/2012)   Asthma    normal chest x-ray in 2010   Basal cell carcinoma of face    Breast cancer    Cholelithiasis    Chronic lower back pain    Degenerative joint disease    Right TKA-2010; low back pain; hands and hips also affected   Exertional shortness of breath    "  mostly from being too fat" (07/01/2012)   GERD (gastroesophageal reflux disease)    history   H/O hiatal hernia    Heart murmur    small   Hemorrhoid    HTN (hypertension)    Hyperlipidemia    Macular degeneration    "both eyes" (07/01/2012)   PUD (peptic ulcer disease) 2003   Upper GI bleed-gastric ulcer; gastroesophageal reflux disease   Seasonal allergies    Type II diabetes mellitus     Past Surgical History:  Procedure Laterality Date   APPENDECTOMY     BREAST LUMPECTOMY Left    BREAST LUMPECTOMY WITH RADIOACTIVE SEED LOCALIZATION Left 10/21/2017   Procedure:  BREAST LUMPECTOMY WITH RADIOACTIVE SEED LOCALIZATION;  Surgeon: Claud Kelp, MD;  Location: [facility];  Service: General;  Laterality: Left;   CATARACT EXTRACTION BILATERAL W/ ANTERIOR VITRECTOMY Bilateral    EYE SURGERY Bilateral    "laser in eyes to stop bleeding and injections for macular degeneration" (07/01/2012)   FOOT SURGERY Bilateral    straighten 1st toe with a wedge.   KNEE ARTHROSCOPY Bilateral    SKIN CANCER EXCISION  2013   "face" (07/01/2012)   TOTAL KNEE ARTHROPLASTY Right 2011   TOTAL KNEE ARTHROPLASTY Left 07/01/2012   TOTAL KNEE ARTHROPLASTY Left 07/01/2012   Procedure: LEFT TOTAL KNEE ARTHROPLASTY;  Surgeon: Valeria Batman, MD;  Location: [facility] OR;  Service: Orthopedics;  Laterality: Left;  Left Total Knee Arthroplasty    Family History  Problem Relation Age of Onset   Hodgkin's lymphoma Mother    Cervical cancer Sister        half sister mom's side   Lung cancer Sister    Breast cancer Neg Hx    Social History   Tobacco Use   Smoking status: Former    Packs/day: 2.50    Years: 27.00    Additional pack years: 0.00    Total pack years: 67.50    Types: Cigarettes    Quit date: 02/12/1981    Years since quitting: 41.3   Smokeless tobacco: Never  Vaping Use   Vaping Use: Never used  Substance Use Topics   Alcohol use: No   Drug use: No    Allergies  Allergen Reactions   Celebrex [Celecoxib] Other (See Comments)    Abdominal pain; history of upper GI bleed    Claritin [Loratadine] Hives and Swelling    blisters   Penicillins Hives   Percocet [Oxycodone-Acetaminophen] Other (See Comments)    Severe constipation    Current Meds  Medication Sig   acetaminophen (TYLENOL) 325 MG tablet Take 650 mg by mouth every 6 (six) hours as needed.   albuterol (VENTOLIN HFA) 108 (90 Base) MCG/ACT inhaler Inhale 2 puffs into the lungs every 6 (six) hours as needed for shortness of breath.    amLODipine (NORVASC) 10 MG tablet Take 10 mg by mouth  daily.     carvedilol (COREG) 3.125 MG tablet Take 3.125 mg by mouth 2 (two) times daily. Dose reduction per patient   cetirizine (ZYRTEC) 10 MG tablet Take 10 mg by mouth daily.    Cholecalciferol (VITAMIN D PO) Take 1,000 Units by mouth.   Cyanocobalamin (B-12 COMPLIANCE INJECTION) 1000 MCG/ML KIT Inject 1 mL as directed every 30 (thirty) days.   cyclobenzaprine (FLEXERIL) 5 MG tablet Take 5 mg by mouth daily as needed.   flintstones complete (FLINTSTONES) 60 MG chewable tablet Chew 1 tablet by mouth daily.   furosemide (LASIX) 20 MG tablet Take  1 tablet (20 mg total) by mouth every other day.   glipiZIDE (GLUCOTROL XL) 5 MG 24 hr tablet Take 10 mg by mouth every morning.   lidocaine (XYLOCAINE) 5 % ointment Apply 1 Application topically 3 (three) times daily as needed.   magnesium oxide (MAG-OX) 400 (240 Mg) MG tablet Take 1 tablet (400 mg total) by mouth daily.   Multiple Vitamins-Minerals (PRESERVISION AREDS PO) Take 1 tablet by mouth 2 (two) times daily.    pravastatin (PRAVACHOL) 10 MG tablet Take 10 mg by mouth daily.   Turmeric 450 MG CAPS Take 500 mg by mouth.     Objective: BP (!) 153/79   Pulse 96   Physical Exam:  General: Elderly female., Alert and oriented., No acute distress., and Seated in a wheelchair. Gait: Unable to ambulate.  Left knee without swelling.  She has full range of motion.  Minimal tenderness to palpation.  Left shoulder without obvious deformity.  She has very limited range of motion due to pain.  There is some positive crepitus with range of motion.  Sensation is intact throughout the left arm.  Sensation intact in the axillary nerve distribution.  Active motion intact throughout the left hand.  She also is tenderness to palpation of the [employer] joint on the left.  No overlying bruising or redness.  IMAGING: I personally ordered and reviewed the following images   X-rays left shoulder obtained in clinic today.  No acute injuries are noted.  Advanced  degenerative changes, with complete loss of joint space.  There are large inferior osteophytes off the humeral head.  Humeral head is starting to flatten.  No evidence of proximal humeral migration.  No bony lesions.  Impression: Severe left glenohumeral joint arthritis   New Medications:  No orders of the defined types were placed in this encounter.     Oliver Barre, MD  05/30/2022 9:33 AM

## 2022-05-30 NOTE — Patient Instructions (Signed)

## 2022-06-04 ENCOUNTER — Encounter: Payer: Self-pay | Admitting: Family Medicine

## 2022-06-04 ENCOUNTER — Ambulatory Visit (INDEPENDENT_AMBULATORY_CARE_PROVIDER_SITE_OTHER): Payer: PPO | Admitting: Family Medicine

## 2022-06-04 VITALS — BP 111/69 | HR 89 | Temp 97.6°F | Ht 63.0 in | Wt 223.0 lb

## 2022-06-04 DIAGNOSIS — E1122 Type 2 diabetes mellitus with diabetic chronic kidney disease: Secondary | ICD-10-CM

## 2022-06-04 DIAGNOSIS — R350 Frequency of micturition: Secondary | ICD-10-CM

## 2022-06-04 DIAGNOSIS — R413 Other amnesia: Secondary | ICD-10-CM | POA: Diagnosis not present

## 2022-06-04 DIAGNOSIS — F32A Depression, unspecified: Secondary | ICD-10-CM | POA: Diagnosis not present

## 2022-06-04 DIAGNOSIS — N183 Chronic kidney disease, stage 3 unspecified: Secondary | ICD-10-CM | POA: Diagnosis not present

## 2022-06-04 DIAGNOSIS — F419 Anxiety disorder, unspecified: Secondary | ICD-10-CM

## 2022-06-04 LAB — POCT URINALYSIS DIP (CLINITEK)
Bilirubin, UA: NEGATIVE
Glucose, UA: NEGATIVE mg/dL
Ketones, POC UA: NEGATIVE mg/dL
Leukocytes, UA: NEGATIVE
Nitrite, UA: NEGATIVE
POC PROTEIN,UA: 30 — AB
Spec Grav, UA: 1.015 (ref 1.010–1.025)
Urobilinogen, UA: 0.2 E.U./dL
pH, UA: 6 (ref 5.0–8.0)

## 2022-06-04 MED ORDER — LINAGLIPTIN 5 MG PO TABS: 5.0000 mg | ORAL_TABLET | Freq: Every day | ORAL | 3 refills | Status: DC

## 2022-06-04 MED ORDER — SERTRALINE HCL 50 MG PO TABS: 50.0000 mg | ORAL_TABLET | Freq: Every day | ORAL | 1 refills | Status: DC

## 2022-06-04 NOTE — Assessment & Plan Note (Signed)
?   Beginnings of dementia vs Depression. Starting SSRI. Has upcoming follow up for recheck.

## 2022-06-04 NOTE — Assessment & Plan Note (Signed)
Continue Glipizide. Adding Tradjenta.

## 2022-06-04 NOTE — Patient Instructions (Signed)
Medications as prescribed.  Follow up in 6 weeks.

## 2022-06-04 NOTE — Progress Notes (Signed)
Subjective:  Patient ID: Shelly [name], female    DOB: 05/10/1940  Age: 82 y.o. MRN: [id number]  CC: Chief Complaint  Patient presents with   multiple falls in past month    3 /24 and 5/15 had Xray of the knee , received left shoulder pain injection and swelling discomfort    HPI:  82 year old female with uncontrolled DM-2, CKD, HTN, HLD, Blindness presents for follow up.  Multiple issues/concerns today. Accompanied by daughter today.  Frequent falls recently. Has been seen by Ortho and Urgent care. Knee pain has resolved. Has advanced arthritis of the left shoulder.  Daughter is very concerned. In addition to frequent falls, she has been more forgetful and confused at times. Intermittent headaches. Has also had decreased appetite. Patient endorses that she doesn't do much given her physical limitations. She denies depression but daughter is concerned that this is the case. Also, patient and daughter reported significant anxiety and claustrophobia.  Lastly, she has still yet to get Trulicity. Daughter would like to start something else given lack of availability of Trulicity.   Patient Active Problem List   Diagnosis Date Noted   Anxiety and depression 06/04/2022   Memory changes 06/04/2022   B12 deficiency 04/17/2022   CKD (chronic kidney disease) stage 3, GFR 30-59 ml/min 04/16/2022   Legally blind 04/16/2022   Malignant neoplasm of upper-outer quadrant of left breast in female, estrogen receptor positive 09/18/2017   Osteoarthritis of left knee 07/03/2012   Hyperlipidemia    Diabetes mellitus    PUD (peptic ulcer disease)    Degenerative joint disease    Essential hypertension 12/07/2008    Social Hx   Social History   Socioeconomic History   Marital status: Divorced    Spouse name: Not on file   Number of children: 2   Years of education: Not on file   Highest education level: Not on file  Occupational History   Not on file  Tobacco Use   Smoking status:  Former    Packs/day: 2.50    Years: 27.00    Additional pack years: 0.00    Total pack years: 67.50    Types: Cigarettes    Quit date: 02/12/1981    Years since quitting: 41.3   Smokeless tobacco: Never  Vaping Use   Vaping Use: Never used  Substance and Sexual Activity   Alcohol use: No   Drug use: No   Sexual activity: Not Currently  Other Topics Concern   Not on file  Social History Narrative   Lives locally. Works part time at the [employer].    Social Determinants of Health   Financial Resource Strain: Not on file  Food Insecurity: Not on file  Transportation Needs: Not on file  Physical Activity: Not on file  Stress: Not on file  Social Connections: Not on file    Review of Systems Per HPI  Objective:  BP 111/69   Pulse 89   Temp 97.6 F (36.4 C)   Ht  (1.6 m)   Wt 223 lb (101.2 kg)   SpO2 95%   BMI 39.50 kg/m      06/04/2022    3:24 PM 05/30/2022    8:20 AM 05/21/2022    5:55 PM  BP/Weight  Systolic BP 111 153 124  Diastolic BP 69 79 73  Wt. (Lbs) 223    BMI 39.5 kg/m2      Physical Exam Vitals and nursing note reviewed.  Constitutional:      General: She is not in acute distress.    Appearance: Normal appearance. She is obese.  HENT:     Head: Normocephalic and atraumatic.  Cardiovascular:     Rate and Rhythm: Normal rate and regular rhythm.  Pulmonary:     Effort: Pulmonary effort is normal.     Breath sounds: Normal breath sounds.  Neurological:     Mental Status: She is alert.  Psychiatric:        Mood and Affect: Mood normal.        Behavior: Behavior normal.     Lab Results  Component Value Date   WBC 6.3 04/16/2022   HGB 13.5 04/16/2022   HCT 43.5 04/16/2022   PLT 272 04/16/2022   GLUCOSE 214 (H) 04/16/2022   CHOL 156 04/16/2022   TRIG 330 (H) 04/16/2022   HDL 47 04/16/2022   LDLCALC 58 04/16/2022   ALT 11 04/16/2022   AST 16 04/16/2022   NA 139 04/16/2022   K 5.0 04/16/2022   CL 99  04/16/2022   CREATININE 1.24 (H) 04/16/2022   BUN 26 04/16/2022   CO2 28 04/16/2022   INR 0.98 06/25/2012   HGBA1C 8.3 (H) 04/16/2022     Assessment & Plan:   Problem List Items Addressed This Visit       Endocrine   Diabetes mellitus    Continue Glipizide. Adding Tradjenta.       Relevant Medications   linagliptin (TRADJENTA) 5 MG TABS tablet     Other   Memory changes    ? Beginnings of dementia vs Depression. Starting SSRI. Has upcoming follow up for recheck.      Anxiety and depression    Starting Zoloft.      Relevant Medications   sertraline (ZOLOFT) 50 MG tablet   Other Visit Diagnoses     Frequency of urination    -  Primary   Relevant Orders   POCT URINALYSIS DIP (CLINITEK) (Completed)       Meds ordered this encounter  Medications   linagliptin (TRADJENTA) 5 MG TABS tablet    Sig: Take 1 tablet (5 mg total) by mouth daily.    Dispense:  90 tablet    Refill:  3   sertraline (ZOLOFT) 50 MG tablet    Sig: Take 1 tablet (50 mg total) by mouth daily.    Dispense:  90 tablet    Refill:  1    Follow-up:  Return in about 6 weeks (around 07/16/2022).  Everlene Other DO [facility]

## 2022-06-04 NOTE — Assessment & Plan Note (Signed)
Starting Zoloft.

## 2022-06-05 ENCOUNTER — Telehealth: Payer: Self-pay | Admitting: Orthopedic Surgery

## 2022-06-05 NOTE — Telephone Encounter (Signed)
FMLA [employer] forms have been received for family member Forensic scientist. Patient last seen 05/30/22 note doesn't mention planned surgery or that patient needs care from a family member. Please advise how to proceed with forms. Thanks

## 2022-06-06 NOTE — Telephone Encounter (Signed)
Called and left VM asking for a call back with information or for a copy of paperwork to be brought to be completed.

## 2022-07-03 ENCOUNTER — Ambulatory Visit
Admission: EM | Admit: 2022-07-03 | Discharge: 2022-07-03 | Disposition: A | Discharge status: Home / Self Care | Payer: PPO | Attending: Nurse Practitioner | Admitting: Nurse Practitioner

## 2022-07-03 ENCOUNTER — Encounter: Payer: Self-pay | Admitting: Emergency Medicine

## 2022-07-03 ENCOUNTER — Ambulatory Visit (INDEPENDENT_AMBULATORY_CARE_PROVIDER_SITE_OTHER): Payer: PPO

## 2022-07-03 DIAGNOSIS — L03116 Cellulitis of left lower limb: Secondary | ICD-10-CM | POA: Diagnosis not present

## 2022-07-03 DIAGNOSIS — Z96652 Presence of left artificial knee joint: Secondary | ICD-10-CM | POA: Diagnosis not present

## 2022-07-03 DIAGNOSIS — M25462 Effusion, left knee: Secondary | ICD-10-CM

## 2022-07-03 DIAGNOSIS — M25562 Pain in left knee: Secondary | ICD-10-CM | POA: Diagnosis not present

## 2022-07-03 DIAGNOSIS — Z471 Aftercare following joint replacement surgery: Secondary | ICD-10-CM | POA: Diagnosis not present

## 2022-07-03 MED ORDER — DOXYCYCLINE HYCLATE 100 MG PO TABS: 100.0000 mg | ORAL_TABLET | Freq: Two times a day (BID) | ORAL | 0 refills | Status: AC

## 2022-07-03 NOTE — ED Provider Notes (Signed)
[facility]    CSN: [id number] Arrival date & time: 07/03/22  1210      History   Chief Complaint No chief complaint on file.   HPI Shelly [name] is a 82 y.o. female.   The history is provided by the patient.   The patient presents for complaints of left knee pain and swelling.  Patient reports that she fell approximately 2 weeks ago.  She states over the past 24 hours, she has developed a "knot" to the left knee.  Patient states that she also has swelling in her legs.  Patient uses a rollator walker for ambulation.  Patient reports that she "falls all the time".  Patient states that due to her history of macular degeneration, it is difficult for her to gauge what is around her.  Patient was last seen in this clinic on 05/21/2022 for contusion of the left knee.She reports that she did not hit her head and denies any associated loss of consciousness, nausea, vomiting, headache, dizziness, confusion, amnesia surrounding event. She does not take blood thinning medication.   Past Medical History:  Diagnosis Date   Arthritis    "qwhere" (07/01/2012)   Asthma    normal chest x-ray in 2010   Basal cell carcinoma of face    Breast cancer (HCC)    Cholelithiasis    Chronic lower back pain    Degenerative joint disease    Right TKA-2010; low back pain; hands and hips also affected   Exertional shortness of breath    "mostly from being too fat" (07/01/2012)   GERD (gastroesophageal reflux disease)    history   H/O hiatal hernia    Heart murmur    small   Hemorrhoid    HTN (hypertension)    Hyperlipidemia    Macular degeneration    "both eyes" (07/01/2012)   PUD (peptic ulcer disease) 2003   Upper GI bleed-gastric ulcer; gastroesophageal reflux disease   Seasonal allergies    Type II diabetes mellitus (HCC)     Patient Active Problem List   Diagnosis Date Noted   Anxiety and depression 06/04/2022   Memory changes 06/04/2022   B12 deficiency 04/17/2022   CKD  (chronic kidney disease) stage 3, GFR 30-59 ml/min (HCC) 04/16/2022   Legally blind 04/16/2022   Malignant neoplasm of upper-outer quadrant of left breast in female, estrogen receptor positive (HCC) 09/18/2017   Osteoarthritis of left knee 07/03/2012   Hyperlipidemia    Diabetes mellitus (HCC)    PUD (peptic ulcer disease)    Degenerative joint disease    Essential hypertension 12/07/2008    Past Surgical History:  Procedure Laterality Date   APPENDECTOMY     BREAST LUMPECTOMY Left    BREAST LUMPECTOMY WITH RADIOACTIVE SEED LOCALIZATION Left 10/21/2017   Procedure: BREAST LUMPECTOMY WITH RADIOACTIVE SEED LOCALIZATION;  Surgeon: Claud Kelp, MD;  Location: [facility];  Service: General;  Laterality: Left;   CATARACT EXTRACTION BILATERAL W/ ANTERIOR VITRECTOMY Bilateral    EYE SURGERY Bilateral    "laser in eyes to stop bleeding and injections for macular degeneration" (07/01/2012)   FOOT SURGERY Bilateral    straighten 1st toe with a wedge.   KNEE ARTHROSCOPY Bilateral    SKIN CANCER EXCISION  2013   "face" (07/01/2012)   TOTAL KNEE ARTHROPLASTY Right 2011   TOTAL KNEE ARTHROPLASTY Left 07/01/2012   TOTAL KNEE ARTHROPLASTY Left 07/01/2012   Procedure: LEFT TOTAL KNEE ARTHROPLASTY;  Surgeon: Valeria Batman, MD;  Location:  [facility];  Service: Orthopedics;  Laterality: Left;  Left Total Knee Arthroplasty    OB History   No obstetric history on file.      Home Medications    Prior to Admission medications   Medication Sig Start Date End Date Taking? Authorizing Provider  doxycycline (VIBRA-TABS) 100 MG tablet Take 1 tablet (100 mg total) by mouth 2 (two) times daily for 5 days. 07/03/22 07/08/22 Yes Osmin Welz-Warren, Sadie Haber, NP  acetaminophen (TYLENOL) 325 MG tablet Take 650 mg by mouth every 6 (six) hours as needed.    [provider]  albuterol (VENTOLIN HFA) 108 (90 Base) MCG/ACT inhaler Inhale 2 puffs into the lungs every 6 (six) hours as needed for  shortness of breath.     [provider]  amLODipine (NORVASC) 10 MG tablet Take 10 mg by mouth daily.      [provider]  carvedilol (COREG) 3.125 MG tablet Take 3.125 mg by mouth 2 (two) times daily. Dose reduction per patient    [provider]  cetirizine (ZYRTEC) 10 MG tablet Take 10 mg by mouth daily.     [provider]  Cholecalciferol (VITAMIN D PO) Take 1,000 Units by mouth.    [provider]  Cyanocobalamin (B-12 COMPLIANCE INJECTION) 1000 MCG/ML KIT Inject 1 mL as directed every 30 (thirty) days. 04/17/22   Tommie Sams, DO  cyclobenzaprine (FLEXERIL) 5 MG tablet Take 5 mg by mouth daily as needed.    [provider]  flintstones complete (FLINTSTONES) 60 MG chewable tablet Chew 1 tablet by mouth daily.    [provider]  furosemide (LASIX) 20 MG tablet Take 1 tablet (20 mg total) by mouth every other day. 12/26/20   Antoine Poche, MD  glipiZIDE (GLUCOTROL XL) 5 MG 24 hr tablet Take 10 mg by mouth every morning.    [provider]  lidocaine (XYLOCAINE) 5 % ointment Apply 1 Application topically 3 (three) times daily as needed. 05/21/22   Raspet, Noberto Retort, PA-C  linagliptin (TRADJENTA) 5 MG TABS tablet Take 1 tablet (5 mg total) by mouth daily. 06/04/22   Tommie Sams, DO  magnesium oxide (MAG-OX) 400 (240 Mg) MG tablet Take 1 tablet (400 mg total) by mouth daily. 08/09/20   Serena Croissant, MD  Multiple Vitamins-Minerals (PRESERVISION AREDS PO) Take 1 tablet by mouth 2 (two) times daily.     [provider]  pravastatin (PRAVACHOL) 10 MG tablet Take 10 mg by mouth daily.    [provider]  sertraline (ZOLOFT) 50 MG tablet Take 1 tablet (50 mg total) by mouth daily. 06/04/22   Tommie Sams, DO  Turmeric 450 MG CAPS Take 500 mg by mouth.     [provider]    Family History Family History  Problem Relation Age of Onset   Hodgkin's lymphoma Mother    Cervical cancer Sister         half sister mom's side   Lung cancer Sister    Breast cancer Neg Hx     Social History Social History   Tobacco Use   Smoking status: Former    Packs/day: 2.50    Years: 27.00    Additional pack years: 0.00    Total pack years: 67.50    Types: Cigarettes    Quit date: 02/12/1981    Years since quitting: 41.4   Smokeless tobacco: Never  Vaping Use   Vaping Use: Never used  Substance Use Topics  Alcohol use: No   Drug use: No     Allergies   Celebrex [celecoxib], Claritin [loratadine], Penicillins, and Percocet [oxycodone-acetaminophen]   Review of Systems Review of Systems   Physical Exam Triage Vital Signs ED Triage Vitals  Enc Vitals Group     BP 07/03/22 1214 133/69     Pulse Rate 07/03/22 1214 96     Resp 07/03/22 1214 20     Temp 07/03/22 1214 (!) 97.3 F (36.3 C)     Temp Source 07/03/22 1214 Oral     SpO2 07/03/22 1221 91 %     Weight --      Height --      Head Circumference --      Peak Flow --      Pain Score 07/03/22 1219 8     Pain Loc --      Pain Edu? --      Excl. in GC? --    No data found.  Updated Vital Signs BP 133/69 (BP Location: Right Arm)   Pulse 96   Temp (!) 97.3 F (36.3 C) (Oral)   Resp 20   SpO2 91%   Visual Acuity Right Eye Distance:   Left Eye Distance:   Bilateral Distance:    Right Eye Near:   Left Eye Near:    Bilateral Near:     Physical Exam Vitals and nursing note reviewed.  Constitutional:      General: She is not in acute distress.    Appearance: Normal appearance.  HENT:     Head: Normocephalic.  Eyes:     Extraocular Movements: Extraocular movements intact.     Pupils: Pupils are equal, round, and reactive to light.  Pulmonary:     Effort: Pulmonary effort is normal.  Musculoskeletal:     Cervical back: Normal range of motion.     Left knee: Swelling and erythema present. No deformity. Decreased range of motion. Tenderness present over the lateral joint line. Normal pulse.     Comments:  Decreased range of motion with flexion and extension secondary to pain.  Pain at inferior joint line without deformity.  No ligamentous laxity on exam.  Erythematous induration noted to the lateral aspect of the left knee.  Area is mildly warm to palpation.  Skin:    General: Skin is warm and dry.  Neurological:     General: No focal deficit present.     Mental Status: She is alert and oriented to person, place, and time.  Psychiatric:        Mood and Affect: Mood normal.        Behavior: Behavior normal.      UC Treatments / Results  Labs (all labs ordered are listed, but only abnormal results are displayed) Labs Reviewed - No data to display  EKG   Radiology DG Knee Complete 4 Views Left  Result Date: 07/03/2022 CLINICAL DATA:  Left knee pain after fall 2 weeks ago EXAM: LEFT KNEE - COMPLETE 4+ VIEW COMPARISON:  05/21/2022 FINDINGS: Status post left total knee arthroplasty. Arthroplasty components are in their expected alignment without periprosthetic lucency or fracture. Small knee joint effusion versus postoperative synovial thickening. Soft tissues within normal limits. IMPRESSION: Status post left total knee arthroplasty without evidence of fracture or hardware complication. Electronically Signed   By: Duanne Guess D.O.   On: 07/03/2022 12:47    Procedures Procedures (including critical care time)  Medications Ordered in UC Medications - No data to display  Initial Impression / Assessment and Plan / UC Course  I have reviewed the triage vital signs and the nursing notes.  Pertinent labs & imaging results that were available during my care of the patient were reviewed by me and considered in my medical decision making (see chart for details).  The patient is well-appearing, she is in no acute distress, vital signs are stable.  X-ray is negative for fracture or dislocation.  Patient does have swelling to the anterior aspect of the left knee that is warm to palpation.   Will treat empirically with doxycycline 100 mg to cover for possible cellulitis..  Patient reports that she has Voltaren gel and a knee brace and an Ace wrap at home.  Patient was advised to use or RICE therapy for swelling.  Patient advised to use the Ace wrap to provide compression and support.  Supportive care recommendations were provided and discussed with the patient to include use of Tylenol arthritis strength tablets as needed for pain or discomfort.  Advised patient that it is recommended that she follow-up with orthopedics for reevaluation.  Patient was given information for Ortho care [facility] and for [facility].  Patient was also advised she can follow-up with the orthopedist who performed her knee surgery.  Patient was in agreement with this plan of care and verbalizes understanding.  Patient advised to follow-up with her primary care physician or with orthopedics sooner if symptoms worsen or fail to improve.   Final Clinical Impressions(s) / UC Diagnoses   Final diagnoses:  Pain and swelling of knee, left  Cellulitis of left knee     Discharge Instructions      The x-ray was negative for fracture or dislocation. Take medication as prescribed. May use over-the-counter Tylenol arthritis strength 650 mg tablets as needed for knee pain or discomfort. RICE therapy, rest, ice, compression, and elevation. Recommend using your knee brace or an ACE wrap to provide compression and support for the left knee. Elevate your legs above the level of the heart is much as possible to help decrease swelling. If symptoms do not improve, would like for you to follow-up with orthopedics for further evaluation.  For your lower extremity swelling, I would also like for you to follow-up with your primary care physician for further evaluation. Follow-up as needed.     ED Prescriptions     Medication Sig Dispense Auth. Provider   doxycycline (VIBRA-TABS) 100 MG tablet Take 1 tablet (100 mg total)  by mouth 2 (two) times daily for 5 days. 10 tablet Dorrie Cocuzza-Warren, Sadie Haber, NP      PDMP not reviewed this encounter.   Abran Cantor, NP 07/03/22 1308

## 2022-07-03 NOTE — ED Triage Notes (Signed)
Fell 2 weeks ago.  Pain to left knee. Patient has a knot on left knee.

## 2022-07-03 NOTE — Discharge Instructions (Addendum)
The x-ray was negative for fracture or dislocation. Take medication as prescribed. May use over-the-counter Tylenol arthritis strength 650 mg tablets as needed for knee pain or discomfort. RICE therapy, rest, ice, compression, and elevation. Recommend using your knee brace or an ACE wrap to provide compression and support for the left knee. Elevate your legs above the level of the heart is much as possible to help decrease swelling. If symptoms do not improve, would like for you to follow-up with orthopedics for further evaluation.  For your lower extremity swelling, I would also like for you to follow-up with your primary care physician for further evaluation. Follow-up as needed.

## 2022-07-05 ENCOUNTER — Ambulatory Visit: Payer: PPO | Admitting: Nurse Practitioner

## 2022-07-11 ENCOUNTER — Encounter (INDEPENDENT_AMBULATORY_CARE_PROVIDER_SITE_OTHER): Payer: PPO | Admitting: Ophthalmology

## 2022-07-11 DIAGNOSIS — I10 Essential (primary) hypertension: Secondary | ICD-10-CM

## 2022-07-11 DIAGNOSIS — H353231 Exudative age-related macular degeneration, bilateral, with active choroidal neovascularization: Secondary | ICD-10-CM | POA: Diagnosis not present

## 2022-07-11 DIAGNOSIS — H35033 Hypertensive retinopathy, bilateral: Secondary | ICD-10-CM

## 2022-07-11 DIAGNOSIS — H43813 Vitreous degeneration, bilateral: Secondary | ICD-10-CM

## 2022-07-16 ENCOUNTER — Ambulatory Visit: Payer: PPO | Admitting: Family Medicine

## 2022-07-17 ENCOUNTER — Encounter: Payer: Self-pay | Admitting: Family Medicine

## 2022-07-17 ENCOUNTER — Ambulatory Visit (INDEPENDENT_AMBULATORY_CARE_PROVIDER_SITE_OTHER): Payer: PPO | Admitting: Family Medicine

## 2022-07-17 VITALS — BP 121/74 | HR 88 | Temp 97.7°F | Ht 63.0 in | Wt 214.0 lb

## 2022-07-17 DIAGNOSIS — G8929 Other chronic pain: Secondary | ICD-10-CM | POA: Diagnosis not present

## 2022-07-17 DIAGNOSIS — M25531 Pain in right wrist: Secondary | ICD-10-CM | POA: Diagnosis not present

## 2022-07-17 DIAGNOSIS — I10 Essential (primary) hypertension: Secondary | ICD-10-CM | POA: Diagnosis not present

## 2022-07-17 DIAGNOSIS — N183 Chronic kidney disease, stage 3 unspecified: Secondary | ICD-10-CM | POA: Diagnosis not present

## 2022-07-17 DIAGNOSIS — E1122 Type 2 diabetes mellitus with diabetic chronic kidney disease: Secondary | ICD-10-CM

## 2022-07-17 MED ORDER — TRAMADOL HCL 50 MG PO TABS: 50.0000 mg | ORAL_TABLET | Freq: Two times a day (BID) | ORAL | 0 refills | Status: DC | PRN

## 2022-07-17 MED ORDER — VALSARTAN 80 MG PO TABS: 80.0000 mg | ORAL_TABLET | Freq: Every day | ORAL | 3 refills | Status: DC

## 2022-07-17 NOTE — Patient Instructions (Addendum)
Labs in 7-10 days.  Xray at the hospital.  Stop Amlodipine. Start Valsartan. No lasix.  Follow up in 3 months.

## 2022-07-18 DIAGNOSIS — G8929 Other chronic pain: Secondary | ICD-10-CM | POA: Insufficient documentation

## 2022-07-18 NOTE — Assessment & Plan Note (Signed)
Variances side effects from medication.  Stopping amlodipine.  Starting valsartan.  Labs ordered.

## 2022-07-18 NOTE — Assessment & Plan Note (Signed)
Glycemic control improving.  A1c ordered.  Continue glipizide and Tradjenta.

## 2022-07-18 NOTE — Progress Notes (Signed)
Subjective:  Patient ID: Shelly [name], female    DOB: 04/19/1940  Age: 82 y.o. MRN: [id number]  CC: Chief Complaint  Patient presents with   leg and foot swelling    frequent falls    Bilaterally - request for lasix , sob with walking  Hx of frequent falls - last fall 4/8-24 - macular degeneration / legally blind    HPI:  82 year old female with the below mentioned medical problems presents for follow-up.  Hypertension is well-controlled.  However, patient experiencing lower extremity edema.  I suspect that this is from amlodipine.  We will discuss this today.  Given diabetes and proteinuria, would benefit from ARB.  Patient reports ongoing pain of the right wrist.  Some associated swelling.  She reports history of remote injury.  Has a longstanding history of osteoarthritis.  She is quite troubled by pain.  Cannot take NSAIDs due to renal function.  Tylenol does not seem to help significantly.  Glycemic control has improved.  Needs A1c.  Currently on glipizide and Tradjenta.  Patient Active Problem List   Diagnosis Date Noted   Chronic pain of right wrist 07/18/2022   Anxiety and depression 06/04/2022   Memory changes 06/04/2022   B12 deficiency 04/17/2022   CKD (chronic kidney disease) stage 3, GFR 30-59 ml/min (HCC) 04/16/2022   Legally blind 04/16/2022   Malignant neoplasm of upper-outer quadrant of left breast in female, estrogen receptor positive (HCC) 09/18/2017   Osteoarthritis of left knee 07/03/2012   Hyperlipidemia    Diabetes mellitus (HCC)    PUD (peptic ulcer disease)    Degenerative joint disease    Essential hypertension 12/07/2008    Social Hx   Social History   Socioeconomic History   Marital status: Divorced    Spouse name: Not on file   Number of children: 2   Years of education: Not on file   Highest education level: Not on file  Occupational History   Not on file  Tobacco Use   Smoking status: Former    Packs/day: 2.50    Years: 27.00     Additional pack years: 0.00    Total pack years: 67.50    Types: Cigarettes    Quit date: 02/12/1981    Years since quitting: 41.4   Smokeless tobacco: Never  Vaping Use   Vaping Use: Never used  Substance and Sexual Activity   Alcohol use: No   Drug use: No   Sexual activity: Not Currently  Other Topics Concern   Not on file  Social History Narrative   Lives locally. Works part time at the [employer].    Social Determinants of Health   Financial Resource Strain: Not on file  Food Insecurity: Not on file  Transportation Needs: Not on file  Physical Activity: Not on file  Stress: Not on file  Social Connections: Not on file    Review of Systems Per HPI  Objective:  BP 121/74   Pulse 88   Temp 97.7 F (36.5 C)   Ht 5\' 3"  (1.6 m)   Wt 214 lb (97.1 kg)   SpO2 95%   BMI 37.91 kg/m      07/17/2022    2:44 PM 07/03/2022   12:14 PM 06/04/2022    3:24 PM  BP/Weight  Systolic BP 121 133 111  Diastolic BP 74 69 69  Wt. (Lbs) 214  223  BMI 37.91 kg/m2  39.5 kg/m2    Physical Exam Vitals and  nursing note reviewed.  Constitutional:      Appearance: Normal appearance. She is obese.  HENT:     Head: Normocephalic and atraumatic.  Eyes:     General:        Right eye: No discharge.        Left eye: No discharge.     Conjunctiva/sclera: Conjunctivae normal.  Cardiovascular:     Rate and Rhythm: Normal rate and regular rhythm.     Comments: 1+ lower extremity edema. Pulmonary:     Effort: Pulmonary effort is normal.     Breath sounds: Normal breath sounds.  Musculoskeletal:     Comments: Right wrist with mild swelling particularly on the ulnar aspect.  Tender.  Neurological:     Mental Status: She is alert.  Psychiatric:        Mood and Affect: Mood normal.        Behavior: Behavior normal.     Lab Results  Component Value Date   WBC 6.3 04/16/2022   HGB 13.5 04/16/2022   HCT 43.5 04/16/2022   PLT 272 04/16/2022   GLUCOSE 214  (H) 04/16/2022   CHOL 156 04/16/2022   TRIG 330 (H) 04/16/2022   HDL 47 04/16/2022   LDLCALC 58 04/16/2022   ALT 11 04/16/2022   AST 16 04/16/2022   NA 139 04/16/2022   K 5.0 04/16/2022   CL 99 04/16/2022   CREATININE 1.24 (H) 04/16/2022   BUN 26 04/16/2022   CO2 28 04/16/2022   INR 0.98 06/25/2012   HGBA1C 8.3 (H) 04/16/2022     Assessment & Plan:   Problem List Items Addressed This Visit       Cardiovascular and Mediastinum   Essential hypertension - Primary    Variances side effects from medication.  Stopping amlodipine.  Starting valsartan.  Labs ordered.      Relevant Medications   valsartan (DIOVAN) 80 MG tablet     Endocrine   Diabetes mellitus (HCC)    Glycemic control improving.  A1c ordered.  Continue glipizide and Tradjenta.      Relevant Medications   valsartan (DIOVAN) 80 MG tablet   Other Relevant Orders   Basic metabolic panel   Hemoglobin A1c     Other   Chronic pain of right wrist    X-ray for further evaluation.  Tramadol as needed.      Relevant Medications   traMADol (ULTRAM) 50 MG tablet   Other Relevant Orders   DG Wrist Complete Right    Meds ordered this encounter  Medications   valsartan (DIOVAN) 80 MG tablet    Sig: Take 1 tablet (80 mg total) by mouth daily.    Dispense:  90 tablet    Refill:  3   traMADol (ULTRAM) 50 MG tablet    Sig: Take 1 tablet (50 mg total) by mouth every 12 (twelve) hours as needed for moderate pain or severe pain.    Dispense:  20 tablet    Refill:  0    Follow-up:  Return in about 3 months (around 10/17/2022).  Everlene Other DO [facility]

## 2022-07-18 NOTE — Assessment & Plan Note (Signed)
X-ray for further evaluation.  Tramadol as needed.

## 2022-07-19 ENCOUNTER — Telehealth: Payer: Self-pay

## 2022-07-19 NOTE — Telephone Encounter (Signed)
PT is calling back about having x-ray done that Dr Adriana Simas had mention in office visit her wrist was bother her. Was she supposed to have x-ray   Charlann Boxer- [phone]

## 2022-07-19 NOTE — Telephone Encounter (Signed)
Patient called and unable to leave message no voicemail. ( Xray for hand has been ordered in [facility]. She can go to [facility] anytime to have it done as a walk in.)

## 2022-08-01 ENCOUNTER — Ambulatory Visit (HOSPITAL_COMMUNITY)
Admission: RE | Admit: 2022-08-01 | Discharge: 2022-08-01 | Disposition: A | Discharge status: Home / Self Care | Payer: PPO | Source: Ambulatory Visit | Attending: Family Medicine | Admitting: Family Medicine

## 2022-08-01 ENCOUNTER — Encounter: Payer: Self-pay | Admitting: Student

## 2022-08-01 ENCOUNTER — Ambulatory Visit: Payer: PPO | Attending: Student | Admitting: Student

## 2022-08-01 VITALS — BP 130/78 | HR 88 | Ht 63.0 in | Wt 211.4 lb

## 2022-08-01 DIAGNOSIS — R600 Localized edema: Secondary | ICD-10-CM

## 2022-08-01 DIAGNOSIS — E782 Mixed hyperlipidemia: Secondary | ICD-10-CM | POA: Diagnosis not present

## 2022-08-01 DIAGNOSIS — I10 Essential (primary) hypertension: Secondary | ICD-10-CM | POA: Diagnosis not present

## 2022-08-01 DIAGNOSIS — Z79899 Other long term (current) drug therapy: Secondary | ICD-10-CM

## 2022-08-01 DIAGNOSIS — M25531 Pain in right wrist: Secondary | ICD-10-CM | POA: Insufficient documentation

## 2022-08-01 DIAGNOSIS — M19031 Primary osteoarthritis, right wrist: Secondary | ICD-10-CM | POA: Diagnosis not present

## 2022-08-01 DIAGNOSIS — G8929 Other chronic pain: Secondary | ICD-10-CM | POA: Insufficient documentation

## 2022-08-01 DIAGNOSIS — N183 Chronic kidney disease, stage 3 unspecified: Secondary | ICD-10-CM | POA: Diagnosis not present

## 2022-08-01 DIAGNOSIS — E1122 Type 2 diabetes mellitus with diabetic chronic kidney disease: Secondary | ICD-10-CM | POA: Diagnosis not present

## 2022-08-01 DIAGNOSIS — N1832 Chronic kidney disease, stage 3b: Secondary | ICD-10-CM | POA: Diagnosis not present

## 2022-08-01 MED ORDER — FUROSEMIDE 20 MG PO TABS: 20.0000 mg | ORAL_TABLET | Freq: Every day | ORAL | 3 refills | Status: DC | PRN

## 2022-08-01 NOTE — Progress Notes (Signed)
Cardiology Office Note    Date:  08/01/2022  ID:  JAQUELYNE BEDNORZ, DOB 1940/11/15, MRN [id number] Cardiologist: Dina Rich, MD    History of Present Illness:    Shelly [name] is a 82 y.o. female with past medical history of HTN, HLD, Stage III CKD, chronic dyspnea on exertion (moderate obstruction by prior PFT's with diagnosis of asthma) and legal blindness who presents to the office today for 24-month follow-up.  She was examined by Dr. Wyline Mood in 01/2022 and reported her breathing had been stable and actually improved with the cooler weather. Given that cardiac testing had been benign, further testing was not pursued. In the interim, it appears she was evaluated by her PCP and reported worsening lower extremity edema which was felt to be due to Amlodipine. Therefore, this was discontinued and transitioned to Valsartan 80 mg daily.  In talking with the patient today, she reports that her lower extremity edema did not improve following dose adjustment of Amlodipine. She has been trying to elevate her lower extremities and also limit her salt intake but still has persistent edema. She has not utilized compression stockings recently. She wonders if she would benefit from a diuretic as she was on this in the past. She has baseline dyspnea on exertion but no acute changes in this. Symptoms are usually worse given weather changes or being exposed to certain fragrances. No associated orthopnea or PND.  She denies any recent exertional chest pain or palpitations.  Studies Reviewed:   EKG: EKG is not ordered today.  Echocardiogram: 03/2021 IMPRESSIONS     1. Left ventricular ejection fraction, by estimation, is 60 to 65%. The  left ventricle has normal function. The left ventricle has no regional  wall motion abnormalities. There is mild asymmetric left ventricular  hypertrophy of the basal-septal segment.  Left ventricular diastolic parameters are consistent with Grade I  diastolic  dysfunction (impaired relaxation).   2. Right ventricular systolic function is normal. The right ventricular  size is normal. There is normal pulmonary artery systolic pressure.   3. The mitral valve is normal in structure. No evidence of mitral valve  regurgitation. No evidence of mitral stenosis.   4. The aortic valve is normal in structure. Aortic valve regurgitation is  not visualized. No aortic stenosis is present.   5. The inferior vena cava is normal in size with greater than 50%  respiratory variability, suggesting right atrial pressure of 3 mmHg.   Physical Exam:   VS:  BP 130/78   Pulse 88   Ht 5\' 3"  (1.6 m)   Wt 211 lb 6.4 oz (95.9 kg)   SpO2 97%   BMI 37.45 kg/m    Wt Readings from Last 3 Encounters:  08/01/22 211 lb 6.4 oz (95.9 kg)  07/17/22 214 lb (97.1 kg)  06/04/22 223 lb (101.2 kg)     GEN: Pleasant, elderly female appearing in no acute distress NECK: No JVD; No carotid bruits CARDIAC: RRR, no murmurs, rubs, gallops RESPIRATORY:  Clear to auscultation without rales, wheezing or rhonchi  ABDOMEN: Appears non-distended. No obvious abdominal masses. EXTREMITIES: No clubbing or cyanosis. 1+ pitting edema up to mid-shins bilaterally.  Distal pedal pulses are 2+ bilaterally.   Assessment and Plan:   1. Lower Extremity Edema - She reports worsening lower extremity edema over the past several months and suspect this is likely due to chronic venous insufficiency. Echocardiogram last year showed a preserved EF of 60 to 65% with no regional wall motion  abnormalities. She did have LVH and grade 1 diastolic dysfunction. - We reviewed the importance of continuing to limit her salt intake and elevating her lower extremities. Also recommended the use of compression stockings. Will provide with an Rx to take Lasix 20 mg as needed and I did recommend not utilizing this more than twice weekly given her renal insufficiency. Will recheck a BMET in 3-4 weeks for reassessment.   2.  HTN - Her blood pressure was initially elevated at 140/76, rechecked and improved to 130/78 during today's visit. Continue Coreg 3.125 mg twice daily and Valsartan 80 mg daily. We reviewed that Valsartan could be further titrate in the future if needed.  3. HLD - Followed by her PCP.  FLP in 04/2022 showed total cholesterol 156, triglycerides 330, HDL 47 and LDL 58. Continue current medical therapy with Pravastatin 10 mg daily.  4. Stage 3 CKD - Followed by Dr. Wolfgang Phoenix. Her creatinine was stable at 1.24 when checked in 04/2022. Will recheck a BMET in 3-4 weeks.    Signed, Ellsworth Lennox, PA-C

## 2022-08-01 NOTE — Patient Instructions (Signed)
Medication Instructions:   Start Lasix 20mg  as needed. Would not take more than twice weekly.   *If you need a refill on your cardiac medications before your next appointment, please call your pharmacy*   Lab Work:  Basic Metabolic Panel in 3-4 weeks.   If you have labs (blood work) drawn today and your tests are completely normal, you will receive your results only by: MyChart Message (if you have MyChart) OR A paper copy in the mail If you have any lab test that is abnormal or we need to change your treatment, we will call you to review the results.  Follow-Up: At [facility], you and your health needs are our priority.  As part of our continuing mission to provide you with exceptional heart care, we have created designated Provider Care Teams.  These Care Teams include your primary Cardiologist (physician) and Advanced Practice Providers (APPs -  Physician Assistants and Nurse Practitioners) who all work together to provide you with the care you need, when you need it.  We recommend signing up for the patient portal called "MyChart".  Sign up information is provided on this After Visit Summary.  MyChart is used to connect with patients for Virtual Visits (Telemedicine).  Patients are able to view lab/test results, encounter notes, upcoming appointments, etc.  Non-urgent messages can be sent to your provider as well.   To learn more about what you can do with MyChart, go to [email or web address].    Your next appointment:   6 month(s)  Provider:   You may see Dina Rich, MD or one of the following Advanced Practice Providers on your designated Care Team:   Combs, PA-C  Jacolyn Reedy, [place]

## 2022-08-02 LAB — BASIC METABOLIC PANEL
BUN/Creatinine Ratio: 27 (ref 12–28)
BUN: 25 mg/dL (ref 8–27)
CO2: 28 mmol/L (ref 20–29)
Calcium: 9.1 mg/dL (ref 8.7–10.3)
Chloride: 98 mmol/L (ref 96–106)
Creatinine, Ser: 0.93 mg/dL (ref 0.57–1.00)
Glucose: 118 mg/dL — ABNORMAL HIGH (ref 70–99)
Potassium: 5.2 mmol/L (ref 3.5–5.2)
Sodium: 140 mmol/L (ref 134–144)
eGFR: 62 mL/min/{1.73_m2} (ref >59)

## 2022-08-02 LAB — HEMOGLOBIN A1C
Est. average glucose Bld gHb Est-mCnc: 143 mg/dL
Hgb A1c MFr Bld: 6.6 % — ABNORMAL HIGH (ref 4.8–5.6)

## 2022-08-06 ENCOUNTER — Telehealth: Payer: Self-pay

## 2022-08-06 DIAGNOSIS — Z79899 Other long term (current) drug therapy: Secondary | ICD-10-CM

## 2022-08-06 NOTE — Telephone Encounter (Signed)
Patient notified and verbalized understanding. Patient had no questions or concerns at this time.  

## 2022-08-06 NOTE — Telephone Encounter (Signed)
-----   Message from Ellsworth Lennox, [place] sent at 08/02/2022  7:15 AM EDT ----- The patient was supposed to have follow-up labs in 3 to 4 weeks but it appears these were obtained yesterday. Please let her know that her kidney function has improved with creatinine now down to 0.93. Potassium is at the higher end of normal at 5.2. She is not on potassium rich foods and would avoid foods high in potassium such as bananas and tomatoes. She is on Valsartan. Would start taking Lasix 1-2 times per week as this should help lower her K+. Repeat BMET in 3-4 weeks as previously recommended.

## 2022-08-09 DIAGNOSIS — L84 Corns and callosities: Secondary | ICD-10-CM | POA: Diagnosis not present

## 2022-08-09 DIAGNOSIS — E1142 Type 2 diabetes mellitus with diabetic polyneuropathy: Secondary | ICD-10-CM | POA: Diagnosis not present

## 2022-08-09 DIAGNOSIS — M79676 Pain in unspecified toe(s): Secondary | ICD-10-CM | POA: Diagnosis not present

## 2022-08-09 DIAGNOSIS — B351 Tinea unguium: Secondary | ICD-10-CM | POA: Diagnosis not present

## 2022-08-13 NOTE — Telephone Encounter (Addendum)
Patient had Xray completed 08/01/22 and is aware of results

## 2022-08-15 DIAGNOSIS — R809 Proteinuria, unspecified: Secondary | ICD-10-CM | POA: Diagnosis not present

## 2022-08-15 DIAGNOSIS — D631 Anemia in chronic kidney disease: Secondary | ICD-10-CM | POA: Diagnosis not present

## 2022-08-15 DIAGNOSIS — E1122 Type 2 diabetes mellitus with diabetic chronic kidney disease: Secondary | ICD-10-CM | POA: Diagnosis not present

## 2022-08-15 DIAGNOSIS — D638 Anemia in other chronic diseases classified elsewhere: Secondary | ICD-10-CM | POA: Diagnosis not present

## 2022-08-15 DIAGNOSIS — N183 Chronic kidney disease, stage 3 unspecified: Secondary | ICD-10-CM | POA: Diagnosis not present

## 2022-08-15 DIAGNOSIS — N1832 Chronic kidney disease, stage 3b: Secondary | ICD-10-CM | POA: Diagnosis not present

## 2022-08-15 DIAGNOSIS — E875 Hyperkalemia: Secondary | ICD-10-CM | POA: Diagnosis not present

## 2022-08-15 DIAGNOSIS — D509 Iron deficiency anemia, unspecified: Secondary | ICD-10-CM | POA: Diagnosis not present

## 2022-08-15 DIAGNOSIS — Z7689 Persons encountering health services in other specified circumstances: Secondary | ICD-10-CM | POA: Diagnosis not present

## 2022-08-23 DIAGNOSIS — N1832 Chronic kidney disease, stage 3b: Secondary | ICD-10-CM | POA: Diagnosis not present

## 2022-08-23 DIAGNOSIS — E1122 Type 2 diabetes mellitus with diabetic chronic kidney disease: Secondary | ICD-10-CM | POA: Diagnosis not present

## 2022-08-23 DIAGNOSIS — E875 Hyperkalemia: Secondary | ICD-10-CM | POA: Diagnosis not present

## 2022-08-24 ENCOUNTER — Encounter: Payer: Self-pay | Admitting: Family Medicine

## 2022-08-30 NOTE — Telephone Encounter (Signed)
Spoke with Dr Selina Cooley office to request records per request

## 2022-08-31 ENCOUNTER — Ambulatory Visit
Admission: EM | Admit: 2022-08-31 | Discharge: 2022-08-31 | Disposition: A | Discharge status: Home / Self Care | Payer: PPO

## 2022-08-31 DIAGNOSIS — M25562 Pain in left knee: Secondary | ICD-10-CM

## 2022-08-31 DIAGNOSIS — R600 Localized edema: Secondary | ICD-10-CM | POA: Diagnosis not present

## 2022-08-31 DIAGNOSIS — M25462 Effusion, left knee: Secondary | ICD-10-CM

## 2022-08-31 NOTE — ED Provider Notes (Signed)
[facility]    CSN: [id number] Arrival date & time: 08/31/22  1633      History   Chief Complaint No chief complaint on file.   HPI Shelly [name] is a 82 y.o. female.   The history is provided by the patient and a relative (Granddaughter).   The patient presents for continued left lower extremity pain and swelling.  Patient complains of pain in the left knee that has been ongoing for several months.  She also reports swelling in the left lower extremity and in the left knee.  Patient was initially seen in this clinic on 05/21/2022 and again on 07/03/2022 after she fell.  Imaging was done, which did not show any acute fractures or dislocations.  On 07/03/2022, patient was treated for possible cellulitis of the left knee due to the presence of fluid, warmth, and erythema.  Today, patient continues to complain of pain and swelling in the left knee.  She states that the swelling comes and goes.  She states "it is good as long as I stay off of it".  Patient states she has seen her PCP who felt that the amlodipine she is currently taking was causing the swelling in her lower extremities.  Patient verbalized that she did see orthopedics as instructed when she was last seen in this clinic, but do not see a note that corroborates this. Past Medical History:  Diagnosis Date   Arthritis    "qwhere" (07/01/2012)   Asthma    normal chest x-ray in 2010   Basal cell carcinoma of face    Breast cancer (HCC)    Cholelithiasis    Chronic lower back pain    Degenerative joint disease    Right TKA-2010; low back pain; hands and hips also affected   Exertional shortness of breath    "mostly from being too fat" (07/01/2012)   GERD (gastroesophageal reflux disease)    history   H/O hiatal hernia    Heart murmur    small   Hemorrhoid    HTN (hypertension)    Hyperlipidemia    Macular degeneration    "both eyes" (07/01/2012)   PUD (peptic ulcer disease) 2003   Upper GI bleed-gastric  ulcer; gastroesophageal reflux disease   Seasonal allergies    Type II diabetes mellitus (HCC)     Patient Active Problem List   Diagnosis Date Noted   Chronic pain of right wrist 07/18/2022   Anxiety and depression 06/04/2022   Memory changes 06/04/2022   B12 deficiency 04/17/2022   CKD (chronic kidney disease) stage 3, GFR 30-59 ml/min (HCC) 04/16/2022   Legally blind 04/16/2022   Malignant neoplasm of upper-outer quadrant of left breast in female, estrogen receptor positive (HCC) 09/18/2017   Osteoarthritis of left knee 07/03/2012   Hyperlipidemia    Diabetes mellitus (HCC)    PUD (peptic ulcer disease)    Degenerative joint disease    Essential hypertension 12/07/2008    Past Surgical History:  Procedure Laterality Date   APPENDECTOMY     BREAST LUMPECTOMY Left    BREAST LUMPECTOMY WITH RADIOACTIVE SEED LOCALIZATION Left 10/21/2017   Procedure: BREAST LUMPECTOMY WITH RADIOACTIVE SEED LOCALIZATION;  Surgeon: Claud Kelp, MD;  Location: [facility];  Service: General;  Laterality: Left;   CATARACT EXTRACTION BILATERAL W/ ANTERIOR VITRECTOMY Bilateral    EYE SURGERY Bilateral    "laser in eyes to stop bleeding and injections for macular degeneration" (07/01/2012)   FOOT SURGERY Bilateral  straighten 1st toe with a wedge.   KNEE ARTHROSCOPY Bilateral    SKIN CANCER EXCISION  2013   "face" (07/01/2012)   TOTAL KNEE ARTHROPLASTY Right 2011   TOTAL KNEE ARTHROPLASTY Left 07/01/2012   TOTAL KNEE ARTHROPLASTY Left 07/01/2012   Procedure: LEFT TOTAL KNEE ARTHROPLASTY;  Surgeon: Valeria Batman, MD;  Location: [facility] OR;  Service: Orthopedics;  Laterality: Left;  Left Total Knee Arthroplasty    OB History   No obstetric history on file.      Home Medications    Prior to Admission medications   Medication Sig Start Date End Date Taking? Authorizing Provider  acetaminophen (TYLENOL) 325 MG tablet Take 650 mg by mouth every 6 (six) hours as needed.    [provider]  albuterol (VENTOLIN HFA) 108 (90 Base) MCG/ACT inhaler Inhale 2 puffs into the lungs every 6 (six) hours as needed for shortness of breath.     [provider]  carvedilol (COREG) 3.125 MG tablet Take 3.125 mg by mouth 2 (two) times daily. Dose reduction per patient    [provider]  cetirizine (ZYRTEC) 10 MG tablet Take 10 mg by mouth daily.     [provider]  Cholecalciferol (VITAMIN D PO) Take 1,000 Units by mouth.    [provider]  Cyanocobalamin (B-12 COMPLIANCE INJECTION) 1000 MCG/ML KIT Inject 1 mL as directed every 30 (thirty) days. 04/17/22   Tommie Sams, DO  cyclobenzaprine (FLEXERIL) 5 MG tablet Take 5 mg by mouth daily as needed.    [provider]  flintstones complete (FLINTSTONES) 60 MG chewable tablet Chew 1 tablet by mouth daily. Patient not taking: Reported on 08/01/2022    [provider]  furosemide (LASIX) 20 MG tablet Take 1 tablet (20 mg total) by mouth daily as needed for edema. 08/01/22   Strader, Lennart Pall, PA-C  glipiZIDE (GLUCOTROL XL) 5 MG 24 hr tablet Take 10 mg by mouth every morning.    [provider]  lidocaine (XYLOCAINE) 5 % ointment Apply 1 Application topically 3 (three) times daily as needed. 05/21/22   Raspet, Noberto Retort, PA-C  linagliptin (TRADJENTA) 5 MG TABS tablet Take 1 tablet (5 mg total) by mouth daily. 06/04/22   Tommie Sams, DO  magnesium oxide (MAG-OX) 400 (240 Mg) MG tablet Take 1 tablet (400 mg total) by mouth daily. 08/09/20   Serena Croissant, MD  Multiple Vitamins-Minerals (PRESERVISION AREDS PO) Take 1 tablet by mouth 2 (two) times daily.     [provider]  pravastatin (PRAVACHOL) 10 MG tablet Take 10 mg by mouth daily.    [provider]  sertraline (ZOLOFT) 50 MG tablet Take 1 tablet (50 mg total) by mouth daily. 06/04/22   Tommie Sams, DO  traMADol (ULTRAM) 50 MG tablet Take 1 tablet (50 mg total) by mouth every 12 (twelve) hours as needed for  moderate pain or severe pain. 07/17/22   Tommie Sams, DO  Turmeric 450 MG CAPS Take 500 mg by mouth.     [provider]  valsartan (DIOVAN) 80 MG tablet Take 1 tablet (80 mg total) by mouth daily. 07/17/22   Tommie Sams, DO    Family History Family History  Problem Relation Age of Onset   Hodgkin's lymphoma Mother    Cervical cancer Sister        half sister mom's side   Lung cancer Sister    Breast cancer Neg Hx     Social  History Social History   Tobacco Use   Smoking status: Former    Current packs/day: 0.00    Average packs/day: 2.5 packs/day for 27.0 years (67.5 ttl pk-yrs)    Types: Cigarettes    Start date: 02/12/1954    Quit date: 02/12/1981    Years since quitting: 41.5   Smokeless tobacco: Never  Vaping Use   Vaping status: Never Used  Substance Use Topics   Alcohol use: No   Drug use: No     Allergies   Celebrex [celecoxib], Claritin [loratadine], Penicillins, and Percocet [oxycodone-acetaminophen]   Review of Systems Review of Systems Per HPI  Physical Exam Triage Vital Signs ED Triage Vitals  Encounter Vitals Group     BP 08/31/22 1831 (!) 151/76     Systolic BP Percentile --      Diastolic BP Percentile --      Pulse Rate 08/31/22 1831 94     Resp 08/31/22 1831 13     Temp 08/31/22 1831 98.2 F (36.8 C)     Temp Source 08/31/22 1831 Oral     SpO2 08/31/22 1831 90 %     Weight --      Height --      Head Circumference --      Peak Flow --      Pain Score 08/31/22 1832 8     Pain Loc --      Pain Education --      Exclude from Growth Chart --    No data found.  Updated Vital Signs BP (!) 151/76 (BP Location: Right Arm)   Pulse 94   Temp 98.2 F (36.8 C) (Oral)   Resp 13   SpO2 90%   Visual Acuity Right Eye Distance:   Left Eye Distance:   Bilateral Distance:    Right Eye Near:   Left Eye Near:    Bilateral Near:     Physical Exam Vitals and nursing note reviewed.  Constitutional:      General: She is not in acute  distress.    Appearance: Normal appearance.  HENT:     Head: Normocephalic.  Eyes:     Extraocular Movements: Extraocular movements intact.     Pupils: Pupils are equal, round, and reactive to light.  Cardiovascular:     Rate and Rhythm: Normal rate and regular rhythm.     Pulses: Normal pulses.     Heart sounds: Normal heart sounds.     Comments: Left lower extremity edema greater than right lower extremity Pulmonary:     Effort: Pulmonary effort is normal. No respiratory distress.     Breath sounds: Normal breath sounds. No stridor. No wheezing, rhonchi or rales.  Abdominal:     General: Bowel sounds are normal.     Palpations: Abdomen is soft.  Musculoskeletal:     Cervical back: Normal range of motion.     Left knee: Swelling present. Decreased range of motion. Tenderness present. Normal pulse.     Right lower leg: Edema present.     Left lower leg: Edema present.     Comments: Decreased range of motion with flexion and extension secondary to pain.  Pain at inferior joint line without deformity.  No ligamentous laxity on exam.  Erythematous induration noted to the lateral aspect of the left knee.  Area is mildly warm to palpation.   Skin:    General: Skin is warm and dry.  Neurological:     General: No focal deficit  present.     Mental Status: She is alert and oriented to person, place, and time.  Psychiatric:        Mood and Affect: Mood normal.        Behavior: Behavior normal.      UC Treatments / Results  Labs (all labs ordered are listed, but only abnormal results are displayed) Labs Reviewed - No data to display  EKG   Radiology No results found.  Procedures Procedures (including critical care time)  Medications Ordered in UC Medications - No data to display  Initial Impression / Assessment and Plan / UC Course  I have reviewed the triage vital signs and the nursing notes.  Pertinent labs & imaging results that were available during my care of the  patient were reviewed by me and considered in my medical decision making (see chart for details).  The patient is well-appearing, she is in no acute distress, she is mildly hypertensive, but vitals are stable.  Patient presents with continued symptoms with little change since she was seen in May.  The patient was seen for lower extremity edema by cardiology on 08/15/2022.  Patient also with history of bilateral total knee replacements.  Given the chronicity of the patient's symptoms, patient was advised to follow-up with orthopedics for reevaluation of the left knee, along with following up with her PCP or with cardiology for ongoing lower extremity edema.  Patient states that she will speak with her doctors to discuss use of TED hose to help with lower extremity edema.  Supportive care recommendations were provided and discussed with the patient and her granddaughter to include Tylenol for pain or discomfort, icing the left knee, and elevating the lower extremities is much as possible.  Patient and granddaughter are in agreement with this plan of care and verbalized understanding.  All questions were answered.  Patient stable for discharge.  Final Clinical Impressions(s) / UC Diagnoses   Final diagnoses:  None   Discharge Instructions   None    ED Prescriptions   None    PDMP not reviewed this encounter.   Abran Cantor, NP 08/31/22 1909

## 2022-08-31 NOTE — Discharge Instructions (Addendum)
May take over-the-counter Tylenol as needed for pain or discomfort. May apply ice to the left knee to help with pain and swelling. Please follow-up with your primary care physician and with cardiology to discuss continuing lower extremity edema.  As discussed, you plan to speak with one of them regarding the use of TED hose. Take Lasix as prescribed. Elevate the lower extremities as much as possible to help decrease swelling. For continued or ongoing knee pain, please follow-up with orthopedics for further evaluation. Follow-up as needed.

## 2022-08-31 NOTE — ED Triage Notes (Signed)
Pt c/o left knee and leg swelling x 4 months pt's initially fell on 05/20/2022, was found at home in the floor on 05/21/2022 by family.  Pt states she has swelling and difficulty getting up from her chair and bed whenever she has to move.

## 2022-09-05 ENCOUNTER — Other Ambulatory Visit: Payer: Self-pay | Admitting: Orthopedic Surgery

## 2022-09-05 ENCOUNTER — Ambulatory Visit: Payer: PPO | Admitting: Orthopedic Surgery

## 2022-09-05 ENCOUNTER — Encounter: Payer: Self-pay | Admitting: Orthopedic Surgery

## 2022-09-05 VITALS — BP 138/72 | HR 92 | Ht 63.0 in

## 2022-09-05 DIAGNOSIS — M25462 Effusion, left knee: Secondary | ICD-10-CM | POA: Diagnosis not present

## 2022-09-05 DIAGNOSIS — Z96652 Presence of left artificial knee joint: Secondary | ICD-10-CM | POA: Diagnosis not present

## 2022-09-05 DIAGNOSIS — M25562 Pain in left knee: Secondary | ICD-10-CM

## 2022-09-05 MED ORDER — CEPHALEXIN 250 MG PO CAPS
250.0000 mg | ORAL_CAPSULE | Freq: Four times a day (QID) | ORAL | 0 refills | Status: DC
Start: 2022-09-05 — End: 2022-09-17

## 2022-09-05 NOTE — Patient Instructions (Signed)
We will send you for some blood work.  We removed fluid from the left knee today.  Will send the fluid for analysis.  Continue take the antibiotics.  If you have issues with the antibiotics, please contact our clinic.  Follow-up in 2 weeks, and that she hear from [place] before then.

## 2022-09-05 NOTE — Progress Notes (Signed)
Orthopaedic Clinic Return  Assessment: Shelly [name] is a 82 y.o. female with the following: Left knee pain, h/o L TKA; superficial abscess vs infected TKA   Plan: Shelly [name] has had pain in her left knee.  Recent pain, redness and swelling has started as a result of some recent falls.  No fevers or chills.  She has felt well.  Some redness and drainage around the knee.  Based on her presentation, I am concerned about a prosthetic joint infection.  We will place an order for CBC, ESR and CRP.  I completed an aspiration of the right knee, which has been sent for cell count and culture.  Fluid was cloudy, concerning for an infection.   Will prescribe keflex and plan to see her in 2 weeks.   Procedure note - aspiration left knee joint   Verbal consent was obtained to aspirate the left knee joint  Timeout was completed to confirm the site of aspiration. The skin was prepped with alcohol and ethyl chloride was sprayed at the aspiration site.  An 18-gauge needle was inserted and 10 cc of cloudy, bloody joint fluid was aspirated using a superolateral approach.  There were no complications. A sterile bandage was applied.   Meds ordered this encounter  Medications   cephALEXin (KEFLEX) 250 MG capsule    Sig: Take 1 capsule (250 mg total) by mouth 4 (four) times daily.    Dispense:  40 capsule    Refill:  0    Body mass index is 37.45 kg/m.  Follow-up: Return in about 2 weeks (around 09/19/2022).   Subjective:  No chief complaint on file.   History of Present Illness: Shelly [name] is a 82 y.o. female who returns to clinic for evaluation of left knee pain.  She has a history of left knee arthroplasty approximately 10 years ago with Dr. Cleophas [name].  She has done well, until recently.  She reports several falls over the past couple of months.   This has resulted in some redness over the knee, extending distally to the lower leg.  Some drainage over the anterior knee.  No fevers or  chills.  Her knee is achy.  She has some issues with her balance.   Review of Systems: No fevers or chills No numbness or tingling No chest pain No shortness of breath No bowel or bladder dysfunction No GI distress No headaches   Objective: BP 138/72   Pulse 92   Ht 5\' 3"  (1.6 m)   BMI 37.45 kg/m   Physical Exam:  Elderly female.  Alert and oriented.  No acute distress  Surgical incision over her knee is clean, dry and intact.  There is swelling over the anterolateral knee, some fluctuance.  No active drainage, but there are some sinus tracts in this area.  Tenderness over the knee and into the lower leg.  Can achieve full extension, limited knee flexion.      IMAGING: I personally ordered and reviewed the following images:  XR of the left knee was recently obtained.  Well positioned left knee arthroplasty.  No obvious bony destruction.  No subsidence.    Oliver Barre, MD 09/05/2022 4:38 PM

## 2022-09-07 ENCOUNTER — Telehealth: Payer: Self-pay | Admitting: Orthopedic Surgery

## 2022-09-07 ENCOUNTER — Telehealth: Payer: Self-pay | Admitting: Radiology

## 2022-09-07 DIAGNOSIS — D631 Anemia in chronic kidney disease: Secondary | ICD-10-CM | POA: Diagnosis not present

## 2022-09-07 DIAGNOSIS — N183 Chronic kidney disease, stage 3 unspecified: Secondary | ICD-10-CM | POA: Diagnosis not present

## 2022-09-07 DIAGNOSIS — R809 Proteinuria, unspecified: Secondary | ICD-10-CM | POA: Diagnosis not present

## 2022-09-07 NOTE — Telephone Encounter (Signed)
Dr. Dallas Schimke pt Clydie Braun w/[facility] [phone] lvm stating they need clarification about the patient's labs.  When you call back, use ref # [id number]

## 2022-09-07 NOTE — Telephone Encounter (Signed)
I have called patient about this since we are pending blood work/ not collected yet.  She does not drive, she is trying to get transportation today Explained to her would be best to get labs as soon as possible Cell count resulted Culture pending   I will check on culture Monday.

## 2022-09-07 NOTE — Telephone Encounter (Signed)
-----   Message from Oliver Barre sent at 09/07/2022 10:21 AM EDT ----- Shelly [name]  This patient had a L TKA by Dr. Cleophas Dunker in 2014.  She has had a couple of falls recently and worsening left knee pain.  She presented to my clinic with redness, swelling and some drainage.  She feels well, no fevers or chills. I aspirated her knee and it is infected, cell count 160,000, culture is pending.  There is a picture of her knee in my clinic note.   I started her on keflex and plan to see her back in a couple of weeks.  There is no urgency regarding surgery.  Is this someone you would be willing to follow, or should I refer her elsewhere.  Please let me know what you think is best.  Thanks Loraine Leriche

## 2022-09-07 NOTE — Telephone Encounter (Signed)
I have been holding for 14 minutes, have you gotten any reports? I just printed the cell count.

## 2022-09-07 NOTE — Telephone Encounter (Signed)
I  held for 37 minutes Tried again and got an answer this time They wanted to confirm what is in the swab I confirmed it is synovial fluid of the left knee, that is what they have in the system they were just confirming  There are also orders in system for the CBC Sed Rate and CRP  Patient has not presented to the lab for those yet.

## 2022-09-10 ENCOUNTER — Telehealth: Payer: Self-pay | Admitting: Orthopedic Surgery

## 2022-09-10 DIAGNOSIS — M25462 Effusion, left knee: Secondary | ICD-10-CM

## 2022-09-10 DIAGNOSIS — Z96652 Presence of left artificial knee joint: Secondary | ICD-10-CM

## 2022-09-10 DIAGNOSIS — M25562 Pain in left knee: Secondary | ICD-10-CM

## 2022-09-10 NOTE — Telephone Encounter (Signed)
Thanks, I have printed out. Referral made to Dr Roda Shutters

## 2022-09-10 NOTE — Telephone Encounter (Signed)
Dr. Dallas Schimke pt - [facility] [phone] ref # [id number] lvm on 09/08/22 at 10:03am to report critical lab results, stated results have been faxed.

## 2022-09-10 NOTE — Telephone Encounter (Signed)
Can someone call [facility] back, they left 4 message on Saturday and have left 3 more today.

## 2022-09-10 NOTE — Telephone Encounter (Signed)
I can see that Dr Dallas Schimke has viewed lab reports in chart and Dr Roda Shutters is seeing patient as well.  I tried to call too, can't get person.  No action needed at this time, providers have seen the results now.

## 2022-09-10 NOTE — Telephone Encounter (Signed)
She is scheduled to See Dr Roda Shutters on 09/12/22 the culture / cell count is scanned into the chart in the referral I created from Dr Dallas Schimke to Dr Roda Shutters so everyone can see it  For some reason the labs did not result in Epic but I was able to pull up on [facility] site.

## 2022-09-12 ENCOUNTER — Other Ambulatory Visit (INDEPENDENT_AMBULATORY_CARE_PROVIDER_SITE_OTHER): Payer: PPO

## 2022-09-12 ENCOUNTER — Encounter: Payer: Self-pay | Admitting: Orthopaedic Surgery

## 2022-09-12 ENCOUNTER — Ambulatory Visit: Payer: PPO | Admitting: Orthopaedic Surgery

## 2022-09-12 DIAGNOSIS — T8454XA Infection and inflammatory reaction due to internal left knee prosthesis, initial encounter: Secondary | ICD-10-CM

## 2022-09-12 DIAGNOSIS — Z96652 Presence of left artificial knee joint: Secondary | ICD-10-CM

## 2022-09-12 MED ORDER — DOXYCYCLINE HYCLATE 100 MG PO TABS: 100.0000 mg | ORAL_TABLET | Freq: Two times a day (BID) | ORAL | 0 refills | Status: AC

## 2022-09-12 NOTE — Progress Notes (Signed)
Office Visit Note   Patient: Shelly [name]           Date of Birth: 1941-02-01           MRN: [id number] Visit Date: 09/12/2022              Requested by: Tommie Sams, DO [address] [place],  [place] [address] PCP: Tommie Sams, DO   Assessment & Plan: Visit Diagnoses:  1. Infection associated with internal left knee prosthesis, initial encounter (HCC)   2. History of total left knee replacement     Plan: Impression is 82 year old female with chronic prosthetic joint infection of the left knee.  Based on patient's reports of having knee problems dating back a few months in which she visited the urgent care a couple times and was given oral antibiotics which seem to have suppressed the problem temporarily I feel that she needs to have resection arthroplasty and placement of antibiotic spacer.  Patient is currently not septic and stable on oral antibiotics.  I will switch her to doxycycline in the meantime.  We will place her on the surgery schedule as soon as we can.  Risk benefits prognosis rehab reviewed with the patient and her daughter.  Plan would be to place her in a SNF postoperatively.  Follow-Up Instructions: No follow-ups on file.   Orders:  Orders Placed This Encounter  Procedures   XR Knee 1-2 Views Left   Meds ordered this encounter  Medications   doxycycline (VIBRA-TABS) 100 MG tablet    Sig: Take 1 tablet (100 mg total) by mouth 2 (two) times daily.    Dispense:  60 tablet    Refill:  0      Procedures: No procedures performed   Clinical Data: No additional findings.   Subjective: Chief Complaint  Patient presents with   Left Knee - Pain    HPI Patient is a 82 year old female here for surgical consultation for prosthetic joint infection.  Saw Dr. Karlton Lemon last week for acute onset of left knee pain.  She underwent joint aspiration which yielded 100,000 white blood cells.  Patient is a poor historian.  Sounds like she had left knee pain  dating back several months.  She has been to urgent care a couple times and was given oral antibiotics.  Patient underwent index left total knee replacement by Dr. Cleophas Dunker about 11 years ago. Review of Systems  Constitutional: Negative.   HENT: Negative.    Eyes: Negative.   Respiratory: Negative.    Cardiovascular: Negative.   Endocrine: Negative.   Musculoskeletal: Negative.   Neurological: Negative.   Hematological: Negative.   Psychiatric/Behavioral: Negative.    All other systems reviewed and are negative.    Objective: Vital Signs: There were no vitals taken for this visit.  Physical Exam Vitals and nursing note reviewed.  Constitutional:      Appearance: She is well-developed.  HENT:     Head: Atraumatic.     Nose: Nose normal.  Eyes:     Extraocular Movements: Extraocular movements intact.  Cardiovascular:     Pulses: Normal pulses.  Pulmonary:     Effort: Pulmonary effort is normal.  Abdominal:     Palpations: Abdomen is soft.  Musculoskeletal:     Cervical back: Neck supple.  Skin:    General: Skin is warm.     Capillary Refill: Capillary refill takes less than 2 seconds.  Neurological:     Mental Status: She  is alert. Mental status is at baseline.  Psychiatric:        Behavior: Behavior normal.        Thought Content: Thought content normal.        Judgment: Judgment normal.     Ortho Exam Examination of the left knee shows full healed surgical scar.  She has focal areas of redness and swelling with fluctuance.  Her range of motion is painless. Specialty Comments:  No specialty comments available.  Imaging: XR Knee 1-2 Views Left  Result Date: 09/12/2022 X-rays demonstrate a left total knee replacement.  There is a lucency below the tibial baseplate.    PMFS History: Patient Active Problem List   Diagnosis Date Noted   Chronic pain of right wrist 07/18/2022   Anxiety and depression 06/04/2022   Memory changes 06/04/2022   B12 deficiency  04/17/2022   CKD (chronic kidney disease) stage 3, GFR 30-59 ml/min (HCC) 04/16/2022   Legally blind 04/16/2022   Malignant neoplasm of upper-outer quadrant of left breast in female, estrogen receptor positive (HCC) 09/18/2017   Osteoarthritis of left knee 07/03/2012   Hyperlipidemia    Diabetes mellitus (HCC)    PUD (peptic ulcer disease)    Degenerative joint disease    Essential hypertension 12/07/2008   Past Medical History:  Diagnosis Date   Arthritis    "qwhere" (07/01/2012)   Asthma    normal chest x-ray in 2010   Basal cell carcinoma of face    Breast cancer (HCC)    Cholelithiasis    Chronic lower back pain    Degenerative joint disease    Right TKA-2010; low back pain; hands and hips also affected   Exertional shortness of breath    "mostly from being too fat" (07/01/2012)   GERD (gastroesophageal reflux disease)    history   H/O hiatal hernia    Heart murmur    small   Hemorrhoid    HTN (hypertension)    Hyperlipidemia    Macular degeneration    "both eyes" (07/01/2012)   PUD (peptic ulcer disease) 2003   Upper GI bleed-gastric ulcer; gastroesophageal reflux disease   Seasonal allergies    Type II diabetes mellitus (HCC)     Family History  Problem Relation Age of Onset   Hodgkin's lymphoma Mother    Cervical cancer Sister        half sister mom's side   Lung cancer Sister    Breast cancer Neg Hx     Past Surgical History:  Procedure Laterality Date   APPENDECTOMY     BREAST LUMPECTOMY Left    BREAST LUMPECTOMY WITH RADIOACTIVE SEED LOCALIZATION Left 10/21/2017   Procedure: BREAST LUMPECTOMY WITH RADIOACTIVE SEED LOCALIZATION;  Surgeon: Claud Kelp, MD;  Location: [facility];  Service: General;  Laterality: Left;   CATARACT EXTRACTION BILATERAL W/ ANTERIOR VITRECTOMY Bilateral    EYE SURGERY Bilateral    "laser in eyes to stop bleeding and injections for macular degeneration" (07/01/2012)   FOOT SURGERY Bilateral    straighten 1st toe  with a wedge.   KNEE ARTHROSCOPY Bilateral    SKIN CANCER EXCISION  2013   "face" (07/01/2012)   TOTAL KNEE ARTHROPLASTY Right 2011   TOTAL KNEE ARTHROPLASTY Left 07/01/2012   TOTAL KNEE ARTHROPLASTY Left 07/01/2012   Procedure: LEFT TOTAL KNEE ARTHROPLASTY;  Surgeon: Valeria Batman, MD;  Location: [facility] OR;  Service: Orthopedics;  Laterality: Left;  Left Total Knee Arthroplasty   Social History   Occupational  History   Not on file  Tobacco Use   Smoking status: Former    Current packs/day: 0.00    Average packs/day: 2.5 packs/day for 27.0 years (67.5 ttl pk-yrs)    Types: Cigarettes    Start date: 02/12/1954    Quit date: 02/12/1981    Years since quitting: 41.6   Smokeless tobacco: Never  Vaping Use   Vaping status: Never Used  Substance and Sexual Activity   Alcohol use: No   Drug use: No   Sexual activity: Not Currently

## 2022-09-14 ENCOUNTER — Other Ambulatory Visit: Payer: Self-pay

## 2022-09-14 ENCOUNTER — Encounter (HOSPITAL_COMMUNITY): Payer: Self-pay | Admitting: Orthopaedic Surgery

## 2022-09-14 NOTE — Progress Notes (Signed)
PCP - Everlene Other DO Cardiologist - Denman George, MD Nephrologist - Dr Anette Guarneri  EKG - 01/25/2022 Chest x-ray -  ECHO - 03/24/2021   Sleep Study- Denies  DM-  Type II A1c -6.6 08/01/2022  Blood Thinner Instructions: Denies Aspirin Instructions: N/A  ERAS Protcol - Yes COVID TEST- N/A  Anesthesia review: Yes, cardiac hx  -------------  SDW INSTRUCTIONS:  Your procedure is scheduled on Monday Aug 5th. Please report to [facility] Main Entrance "A" at 1345 P.M., and check in at the Admitting office. Call this number if you have problems the morning of surgery: [phone]   Remember: Do not eat  after midnight the night before your surgery  You may drink clear liquids until 1315 the afternoon of your surgery.   Clear liquids allowed are: Water, Non-Citrus Juices (without pulp), Carbonated Beverages, Clear Tea, Black Coffee Only, and Gatorade   Medications to take morning of surgery with a sip of water include: carvedilol (COREG), cyclobenzaprine (FLEXERIL), doxycycline (VIBRA-TABS), sertraline (ZOLOFT)   DIABETES-  Do not take evening dose glipiZIDE (GLUCOTROL XL) .  DO not take on DOS linagliptin (TRADJENTA) -HOLD   hours IF NEEDED acetaminophen (TYLENOL), albuterol (VENTOLIN HFA)   Pleas bring on DOS, cetirizine (ZYRTEC), traMADol (ULTRAM)   As of today, STOP taking any Aspirin (unless otherwise instructed by your surgeon), Aleve, Naproxen, Ibuprofen, Motrin, Advil, Goody's, BC's, all herbal medications, fish oil, and all vitamins.    The Morning of Surgery Do not wear jewelry, make-up or nail polish. Do not wear lotions, powders, or perfumes/colognes, or deodorant Do not bring valuables to the hospital. [facility] is not responsible for any belongings or valuables.  If you are a smoker, DO NOT Smoke 24 hours prior to surgery  If you wear a CPAP at night please bring your mask the morning of surgery   Remember that you must have someone to transport you home  after your surgery, and remain with you for 24 hours if you are discharged the same day.  Please bring cases for contacts, glasses, hearing aids, dentures or bridgework because it cannot be worn into surgery.   Patients discharged the day of surgery will not be allowed to drive home.   Please shower the NIGHT BEFORE/MORNING OF SURGERY (use antibacterial soap like DIAL soap if possible). Wear comfortable clothes the morning of surgery. Oral Hygiene is also important to reduce your risk of infection.  Remember - BRUSH YOUR TEETH THE MORNING OF SURGERY WITH YOUR REGULAR TOOTHPASTE  Patient denies shortness of breath, fever, cough and chest pain.

## 2022-09-14 NOTE — Anesthesia Preprocedure Evaluation (Signed)
Anesthesia Evaluation  Patient identified by MRN, date of birth, ID band Patient awake    Reviewed: Allergy & Precautions, NPO status , Patient's Chart, lab work & pertinent test results, reviewed documented beta blocker date and time   Airway Mallampati: II  TM Distance: >3 FB Neck ROM: Full    Dental  (+) Dental Advisory Given, Teeth Intact   Pulmonary asthma , former smoker   Pulmonary exam normal breath sounds clear to auscultation       Cardiovascular hypertension, Pt. on medications and Pt. on home beta blockers Normal cardiovascular exam+ Valvular Problems/Murmurs  Rhythm:Regular Rate:Normal  Echo 03/24/2021: 1. Left ventricular ejection fraction, by estimation, is 60 to 65%. The left ventricle has normal function. The left ventricle has no regional wall motion abnormalities. There is mild asymmetric left ventricular hypertrophy of the basal-septal segment. Left ventricular diastolic parameters are consistent with Grade I diastolic dysfunction (impaired relaxation).   2. Right ventricular systolic function is normal. The right ventricular size is normal. There is normal pulmonary artery systolic pressure.   3. The mitral valve is normal in structure. No evidence of mitral valve regurgitation. No evidence of mitral stenosis.   4. The aortic valve is normal in structure. Aortic valve regurgitation is not visualized. No aortic stenosis is present.   5. The inferior vena cava is normal in size with greater than 50% respiratory variability, suggesting right atrial pressure of 3 mmHg.       Neuro/Psych  PSYCHIATRIC DISORDERS Anxiety Depression    negative neurological ROS     GI/Hepatic Neg liver ROS, hiatal hernia, PUD,GERD  ,,  Endo/Other  diabetes    Renal/GU Renal disease     Musculoskeletal  (+) Arthritis ,    Abdominal  (+) + obese  Peds  Hematology negative hematology ROS (+)   Anesthesia Other Findings    Reproductive/Obstetrics                             Anesthesia Physical Anesthesia Plan  ASA: 3  Anesthesia Plan: Spinal   Post-op Pain Management: Ofirmev IV (intra-op)* and Regional block*   Induction:   PONV Risk Score and Plan: 3 and Ondansetron, Propofol infusion, TIVA, Treatment may vary due to age or medical condition and Dexamethasone  Airway Management Planned: Natural Airway  Additional Equipment:   Intra-op Plan:   Post-operative Plan:   Informed Consent: I have reviewed the patients History and Physical, chart, labs and discussed the procedure including the risks, benefits and alternatives for the proposed anesthesia with the patient or authorized representative who has indicated his/her understanding and acceptance.     Dental advisory given  Plan Discussed with: CRNA  Anesthesia Plan Comments: (PAT note by Antionette Poles, PA-C: Follows with cardiology for history of HTN, HLD, lower extremity edema, chronic DOE (moderate obstruction by prior PFTs with diagnosis of asthma).  Echo 03/24/2021 showed EF 60 to 65%, grade 1 DD, no significant valvular abnormalities.  History of CKD 3 followed by nephrologist Dr. Wolfgang Phoenix.  Non-insulin-dependent DM2, A1c 6.6 on 08/01/2022.  She will need day of surgery labs and evaluation.  EKG 01/25/2022: NSR.  Rate 77.  Septal infarct, age undetermined.  TTE 03/24/2021: 1. Left ventricular ejection fraction, by estimation, is 60 to 65%. The  left ventricle has normal function. The left ventricle has no regional  wall motion abnormalities. There is mild asymmetric left ventricular  hypertrophy of the basal-septal segment.  Left ventricular  diastolic parameters are consistent with Grade I  diastolic dysfunction (impaired relaxation).  2. Right ventricular systolic function is normal. The right ventricular  size is normal. There is normal pulmonary artery systolic pressure.  3. The mitral valve is normal in  structure. No evidence of mitral valve  regurgitation. No evidence of mitral stenosis.  4. The aortic valve is normal in structure. Aortic valve regurgitation is  not visualized. No aortic stenosis is present.  5. The inferior vena cava is normal in size with greater than 50%  respiratory variability, suggesting right atrial pressure of 3 mmHg.    )        Anesthesia Quick Evaluation

## 2022-09-14 NOTE — Progress Notes (Signed)
Anesthesia Chart Review: Same day workup  Follows with cardiology for history of HTN, HLD, lower extremity edema, chronic DOE (moderate obstruction by prior PFTs with diagnosis of asthma).  Echo 03/24/2021 showed EF 60 to 65%, grade 1 DD, no significant valvular abnormalities.  History of CKD 3 followed by nephrologist Dr. Wolfgang Phoenix.  Non-insulin-dependent DM2, A1c 6.6 on 08/01/2022.  She will need day of surgery labs and evaluation.  EKG 01/25/2022: NSR.  Rate 77.  Septal infarct, age undetermined.  TTE 03/24/2021: 1. Left ventricular ejection fraction, by estimation, is 60 to 65%. The  left ventricle has normal function. The left ventricle has no regional  wall motion abnormalities. There is mild asymmetric left ventricular  hypertrophy of the basal-septal segment.  Left ventricular diastolic parameters are consistent with Grade I  diastolic dysfunction (impaired relaxation).   2. Right ventricular systolic function is normal. The right ventricular  size is normal. There is normal pulmonary artery systolic pressure.   3. The mitral valve is normal in structure. No evidence of mitral valve  regurgitation. No evidence of mitral stenosis.   4. The aortic valve is normal in structure. Aortic valve regurgitation is  not visualized. No aortic stenosis is present.   5. The inferior vena cava is normal in size with greater than 50%  respiratory variability, suggesting right atrial pressure of 3 mmHg.     Zannie Cove [facility] Short Stay Center/Anesthesiology Phone [phone] 09/14/2022 11:04 AM

## 2022-09-16 DIAGNOSIS — T8454XA Infection and inflammatory reaction due to internal left knee prosthesis, initial encounter: Secondary | ICD-10-CM

## 2022-09-17 ENCOUNTER — Encounter (HOSPITAL_COMMUNITY)
Admission: RE | Disposition: A | Discharge status: Skilled Nursing Facility | Payer: Self-pay | Source: Home / Self Care | Attending: Orthopaedic Surgery

## 2022-09-17 ENCOUNTER — Encounter (HOSPITAL_COMMUNITY): Payer: Self-pay | Admitting: Orthopaedic Surgery

## 2022-09-17 ENCOUNTER — Inpatient Hospital Stay (HOSPITAL_COMMUNITY): Payer: PPO | Admitting: Anesthesiology

## 2022-09-17 ENCOUNTER — Other Ambulatory Visit: Payer: Self-pay

## 2022-09-17 ENCOUNTER — Inpatient Hospital Stay (HOSPITAL_COMMUNITY)
Admission: RE | Admit: 2022-09-17 | Discharge: 2022-09-24 | DRG: 467 | Disposition: A | Discharge status: Skilled Nursing Facility | Payer: PPO | Attending: Orthopaedic Surgery | Admitting: Orthopaedic Surgery

## 2022-09-17 ENCOUNTER — Inpatient Hospital Stay (HOSPITAL_COMMUNITY): Payer: PPO

## 2022-09-17 DIAGNOSIS — Z96651 Presence of right artificial knee joint: Secondary | ICD-10-CM | POA: Diagnosis not present

## 2022-09-17 DIAGNOSIS — E7849 Other hyperlipidemia: Secondary | ICD-10-CM | POA: Diagnosis not present

## 2022-09-17 DIAGNOSIS — Z888 Allergy status to other drugs, medicaments and biological substances status: Secondary | ICD-10-CM

## 2022-09-17 DIAGNOSIS — Z880 Allergy status to penicillin: Secondary | ICD-10-CM | POA: Diagnosis not present

## 2022-09-17 DIAGNOSIS — E785 Hyperlipidemia, unspecified: Secondary | ICD-10-CM | POA: Diagnosis present

## 2022-09-17 DIAGNOSIS — B9561 Methicillin susceptible Staphylococcus aureus infection as the cause of diseases classified elsewhere: Secondary | ICD-10-CM | POA: Diagnosis present

## 2022-09-17 DIAGNOSIS — E119 Type 2 diabetes mellitus without complications: Secondary | ICD-10-CM | POA: Diagnosis not present

## 2022-09-17 DIAGNOSIS — M545 Low back pain, unspecified: Secondary | ICD-10-CM | POA: Diagnosis not present

## 2022-09-17 DIAGNOSIS — T84033A Mechanical loosening of internal left knee prosthetic joint, initial encounter: Secondary | ICD-10-CM | POA: Diagnosis not present

## 2022-09-17 DIAGNOSIS — Z853 Personal history of malignant neoplasm of breast: Secondary | ICD-10-CM

## 2022-09-17 DIAGNOSIS — Z87891 Personal history of nicotine dependence: Secondary | ICD-10-CM

## 2022-09-17 DIAGNOSIS — Z6835 Body mass index (BMI) 35.0-35.9, adult: Secondary | ICD-10-CM | POA: Diagnosis not present

## 2022-09-17 DIAGNOSIS — Z7984 Long term (current) use of oral hypoglycemic drugs: Secondary | ICD-10-CM

## 2022-09-17 DIAGNOSIS — I10 Essential (primary) hypertension: Secondary | ICD-10-CM | POA: Diagnosis not present

## 2022-09-17 DIAGNOSIS — Z9889 Other specified postprocedural states: Secondary | ICD-10-CM | POA: Diagnosis not present

## 2022-09-17 DIAGNOSIS — Z885 Allergy status to narcotic agent status: Secondary | ICD-10-CM | POA: Diagnosis not present

## 2022-09-17 DIAGNOSIS — E11649 Type 2 diabetes mellitus with hypoglycemia without coma: Secondary | ICD-10-CM | POA: Diagnosis not present

## 2022-09-17 DIAGNOSIS — N183 Chronic kidney disease, stage 3 unspecified: Secondary | ICD-10-CM | POA: Diagnosis not present

## 2022-09-17 DIAGNOSIS — Z886 Allergy status to analgesic agent status: Secondary | ICD-10-CM | POA: Diagnosis not present

## 2022-09-17 DIAGNOSIS — Z85828 Personal history of other malignant neoplasm of skin: Secondary | ICD-10-CM | POA: Diagnosis not present

## 2022-09-17 DIAGNOSIS — T8450XA Infection and inflammatory reaction due to unspecified internal joint prosthesis, initial encounter: Secondary | ICD-10-CM | POA: Diagnosis not present

## 2022-09-17 DIAGNOSIS — Z807 Family history of other malignant neoplasms of lymphoid, hematopoietic and related tissues: Secondary | ICD-10-CM | POA: Diagnosis not present

## 2022-09-17 DIAGNOSIS — T8454XA Infection and inflammatory reaction due to internal left knee prosthesis, initial encounter: Principal | ICD-10-CM

## 2022-09-17 DIAGNOSIS — Z96652 Presence of left artificial knee joint: Secondary | ICD-10-CM

## 2022-09-17 DIAGNOSIS — M62838 Other muscle spasm: Secondary | ICD-10-CM | POA: Diagnosis not present

## 2022-09-17 DIAGNOSIS — N179 Acute kidney failure, unspecified: Secondary | ICD-10-CM | POA: Diagnosis present

## 2022-09-17 DIAGNOSIS — M1712 Unilateral primary osteoarthritis, left knee: Secondary | ICD-10-CM | POA: Diagnosis not present

## 2022-09-17 DIAGNOSIS — Y831 Surgical operation with implant of artificial internal device as the cause of abnormal reaction of the patient, or of later complication, without mention of misadventure at the time of the procedure: Secondary | ICD-10-CM | POA: Diagnosis present

## 2022-09-17 DIAGNOSIS — K219 Gastro-esophageal reflux disease without esophagitis: Secondary | ICD-10-CM | POA: Diagnosis present

## 2022-09-17 DIAGNOSIS — H3530 Unspecified macular degeneration: Secondary | ICD-10-CM | POA: Diagnosis not present

## 2022-09-17 DIAGNOSIS — M79661 Pain in right lower leg: Secondary | ICD-10-CM | POA: Diagnosis not present

## 2022-09-17 DIAGNOSIS — R531 Weakness: Secondary | ICD-10-CM | POA: Diagnosis not present

## 2022-09-17 DIAGNOSIS — Z8049 Family history of malignant neoplasm of other genital organs: Secondary | ICD-10-CM | POA: Diagnosis not present

## 2022-09-17 DIAGNOSIS — M1990 Unspecified osteoarthritis, unspecified site: Secondary | ICD-10-CM | POA: Diagnosis not present

## 2022-09-17 DIAGNOSIS — H548 Legal blindness, as defined in USA: Secondary | ICD-10-CM | POA: Diagnosis present

## 2022-09-17 DIAGNOSIS — D62 Acute posthemorrhagic anemia: Secondary | ICD-10-CM | POA: Diagnosis not present

## 2022-09-17 DIAGNOSIS — Z801 Family history of malignant neoplasm of trachea, bronchus and lung: Secondary | ICD-10-CM | POA: Diagnosis not present

## 2022-09-17 DIAGNOSIS — F330 Major depressive disorder, recurrent, mild: Secondary | ICD-10-CM | POA: Diagnosis not present

## 2022-09-17 DIAGNOSIS — T8454XD Infection and inflammatory reaction due to internal left knee prosthesis, subsequent encounter: Secondary | ICD-10-CM | POA: Diagnosis not present

## 2022-09-17 DIAGNOSIS — J45909 Unspecified asthma, uncomplicated: Secondary | ICD-10-CM | POA: Diagnosis present

## 2022-09-17 DIAGNOSIS — J309 Allergic rhinitis, unspecified: Secondary | ICD-10-CM | POA: Diagnosis not present

## 2022-09-17 DIAGNOSIS — E162 Hypoglycemia, unspecified: Secondary | ICD-10-CM | POA: Diagnosis not present

## 2022-09-17 DIAGNOSIS — I129 Hypertensive chronic kidney disease with stage 1 through stage 4 chronic kidney disease, or unspecified chronic kidney disease: Secondary | ICD-10-CM

## 2022-09-17 DIAGNOSIS — A4901 Methicillin susceptible Staphylococcus aureus infection, unspecified site: Secondary | ICD-10-CM | POA: Diagnosis not present

## 2022-09-17 DIAGNOSIS — R609 Edema, unspecified: Secondary | ICD-10-CM | POA: Diagnosis not present

## 2022-09-17 DIAGNOSIS — T8450XS Infection and inflammatory reaction due to unspecified internal joint prosthesis, sequela: Secondary | ICD-10-CM | POA: Diagnosis not present

## 2022-09-17 DIAGNOSIS — G8918 Other acute postprocedural pain: Secondary | ICD-10-CM | POA: Diagnosis not present

## 2022-09-17 DIAGNOSIS — E669 Obesity, unspecified: Secondary | ICD-10-CM | POA: Diagnosis present

## 2022-09-17 DIAGNOSIS — K279 Peptic ulcer, site unspecified, unspecified as acute or chronic, without hemorrhage or perforation: Secondary | ICD-10-CM | POA: Diagnosis not present

## 2022-09-17 DIAGNOSIS — Z471 Aftercare following joint replacement surgery: Secondary | ICD-10-CM | POA: Diagnosis not present

## 2022-09-17 HISTORY — PX: TOTAL KNEE REVISION: SHX996

## 2022-09-17 LAB — AEROBIC/ANAEROBIC CULTURE W GRAM STAIN (SURGICAL/DEEP WOUND)

## 2022-09-17 LAB — CBC
HCT: 34 % — ABNORMAL LOW (ref 36.0–46.0)
Hemoglobin: 9.7 g/dL — ABNORMAL LOW (ref 12.0–15.0)
MCH: 21.2 pg — ABNORMAL LOW (ref 26.0–34.0)
MCHC: 28.5 g/dL — ABNORMAL LOW (ref 30.0–36.0)
MCV: 74.2 fL — ABNORMAL LOW (ref 80.0–100.0)
Platelets: 508 10*3/uL — ABNORMAL HIGH (ref 150–400)
RBC: 4.58 MIL/uL (ref 3.87–5.11)
RDW: 17.8 % — ABNORMAL HIGH (ref 11.5–15.5)
WBC: 7.1 10*3/uL (ref 4.0–10.5)
nRBC: 0 % (ref 0.0–0.2)

## 2022-09-17 LAB — GLUCOSE, CAPILLARY
Glucose-Capillary: 229 mg/dL — ABNORMAL HIGH (ref 70–99)
Glucose-Capillary: 136 mg/dL — ABNORMAL HIGH (ref 70–99)
Glucose-Capillary: 108 mg/dL — ABNORMAL HIGH (ref 70–99)
Glucose-Capillary: 138 mg/dL — ABNORMAL HIGH (ref 70–99)

## 2022-09-17 LAB — BASIC METABOLIC PANEL
Anion gap: 11 (ref 5–15)
BUN: 37 mg/dL — ABNORMAL HIGH (ref 8–23)
CO2: 23 mmol/L (ref 22–32)
Calcium: 8.7 mg/dL — ABNORMAL LOW (ref 8.9–10.3)
Chloride: 100 mmol/L (ref 98–111)
Creatinine, Ser: 1.08 mg/dL — ABNORMAL HIGH (ref 0.44–1.00)
GFR, Estimated: 52 mL/min — ABNORMAL LOW (ref >60)
Glucose, Bld: 184 mg/dL — ABNORMAL HIGH (ref 70–99)
Potassium: 4.3 mmol/L (ref 3.5–5.1)
Sodium: 134 mmol/L — ABNORMAL LOW (ref 135–145)

## 2022-09-17 LAB — TYPE AND SCREEN
ABO/RH(D): O NEG
Antibody Screen: NEGATIVE

## 2022-09-17 LAB — SURGICAL PCR SCREEN
MRSA, PCR: NEGATIVE
Staphylococcus aureus: NEGATIVE

## 2022-09-17 SURGERY — TOTAL KNEE REVISION
Anesthesia: Spinal | Site: Knee | Laterality: Left

## 2022-09-17 MED ORDER — ACETAMINOPHEN 325 MG PO TABS
325.0000 mg | ORAL_TABLET | Freq: Four times a day (QID) | ORAL | Status: DC | PRN
  Administered 2022-09-20: 650 mg via ORAL
  Filled 2022-09-17: qty 2

## 2022-09-17 MED ORDER — VANCOMYCIN HCL 1000 MG IV SOLR
INTRAVENOUS | Status: AC
  Filled 2022-09-17: qty 20

## 2022-09-17 MED ORDER — PRONTOSAN WOUND IRRIGATION OPTIME
TOPICAL | Status: DC | PRN
  Administered 2022-09-17: 350 mL

## 2022-09-17 MED ORDER — CHLORHEXIDINE GLUCONATE 0.12 % MT SOLN
OROMUCOSAL | Status: AC
  Filled 2022-09-17: qty 15

## 2022-09-17 MED ORDER — ORAL CARE MOUTH RINSE: 15.0000 mL | Freq: Once | OROMUCOSAL | Status: AC

## 2022-09-17 MED ORDER — ONDANSETRON HCL 4 MG PO TABS: 4.0000 mg | ORAL_TABLET | Freq: Four times a day (QID) | ORAL | Status: DC | PRN

## 2022-09-17 MED ORDER — CEFAZOLIN SODIUM-DEXTROSE 2-4 GM/100ML-% IV SOLN
2.0000 g | INTRAVENOUS | Status: AC
  Administered 2022-09-17: 2 g via INTRAVENOUS

## 2022-09-17 MED ORDER — VANCOMYCIN HCL 1000 MG IV SOLR
INTRAVENOUS | Status: DC | PRN
  Administered 2022-09-17: 1000 mg via TOPICAL
  Administered 2022-09-17: 8 g

## 2022-09-17 MED ORDER — DEXAMETHASONE SODIUM PHOSPHATE 10 MG/ML IJ SOLN
10.0000 mg | Freq: Once | INTRAMUSCULAR | Status: AC
  Administered 2022-09-18: 10 mg via INTRAVENOUS
  Filled 2022-09-17: qty 1

## 2022-09-17 MED ORDER — CEFAZOLIN SODIUM-DEXTROSE 2-4 GM/100ML-% IV SOLN
2.0000 g | Freq: Four times a day (QID) | INTRAVENOUS | Status: AC
  Administered 2022-09-17 – 2022-09-18 (×2): 2 g via INTRAVENOUS
  Filled 2022-09-17 (×2): qty 100

## 2022-09-17 MED ORDER — LACTATED RINGERS IV SOLN: INTRAVENOUS | Status: DC

## 2022-09-17 MED ORDER — VANCOMYCIN HCL 1000 MG IV SOLR
INTRAVENOUS | Status: AC
  Filled 2022-09-17: qty 160

## 2022-09-17 MED ORDER — SODIUM CHLORIDE 0.9 % IV SOLN: INTRAVENOUS | Status: DC

## 2022-09-17 MED ORDER — TRANEXAMIC ACID 1000 MG/10ML IV SOLN
2000.0000 mg | INTRAVENOUS | Status: AC
  Filled 2022-09-17 (×2): qty 20

## 2022-09-17 MED ORDER — FERROUS SULFATE 325 (65 FE) MG PO TABS
325.0000 mg | ORAL_TABLET | Freq: Three times a day (TID) | ORAL | Status: DC
  Administered 2022-09-18 – 2022-09-24 (×19): 325 mg via ORAL
  Filled 2022-09-17 (×17): qty 1

## 2022-09-17 MED ORDER — LINAGLIPTIN 5 MG PO TABS
5.0000 mg | ORAL_TABLET | Freq: Every day | ORAL | Status: DC
  Administered 2022-09-18 – 2022-09-19 (×2): 5 mg via ORAL
  Filled 2022-09-17 (×2): qty 1

## 2022-09-17 MED ORDER — METOCLOPRAMIDE HCL 5 MG/ML IJ SOLN: 5.0000 mg | Freq: Three times a day (TID) | INTRAMUSCULAR | Status: DC | PRN

## 2022-09-17 MED ORDER — TRANEXAMIC ACID 1000 MG/10ML IV SOLN
INTRAVENOUS | Status: DC | PRN
  Administered 2022-09-17: 2000 mg via TOPICAL

## 2022-09-17 MED ORDER — CEFAZOLIN SODIUM-DEXTROSE 2-4 GM/100ML-% IV SOLN
INTRAVENOUS | Status: AC
  Filled 2022-09-17: qty 100

## 2022-09-17 MED ORDER — LIDOCAINE 2% (20 MG/ML) 5 ML SYRINGE
INTRAMUSCULAR | Status: DC | PRN
  Administered 2022-09-17: 30 mg via INTRAVENOUS

## 2022-09-17 MED ORDER — POVIDONE-IODINE 10 % EX SWAB
2.0000 | Freq: Once | CUTANEOUS | Status: AC
  Administered 2022-09-17: 2 via TOPICAL

## 2022-09-17 MED ORDER — PHENYLEPHRINE 80 MCG/ML (10ML) SYRINGE FOR IV PUSH (FOR BLOOD PRESSURE SUPPORT)
PREFILLED_SYRINGE | INTRAVENOUS | Status: AC
  Filled 2022-09-17: qty 10

## 2022-09-17 MED ORDER — TOBRAMYCIN SULFATE 1.2 G IJ SOLR
INTRAMUSCULAR | Status: AC
  Filled 2022-09-17: qty 4.8

## 2022-09-17 MED ORDER — ACETAMINOPHEN 500 MG PO TABS
1000.0000 mg | ORAL_TABLET | Freq: Four times a day (QID) | ORAL | Status: DC | PRN
  Administered 2022-09-20 – 2022-09-23 (×2): 1000 mg via ORAL
  Filled 2022-09-17 (×3): qty 2

## 2022-09-17 MED ORDER — ASPIRIN 81 MG PO CHEW
81.0000 mg | CHEWABLE_TABLET | Freq: Two times a day (BID) | ORAL | Status: DC
  Administered 2022-09-17 – 2022-09-24 (×14): 81 mg via ORAL
  Filled 2022-09-17 (×15): qty 1

## 2022-09-17 MED ORDER — TRANEXAMIC ACID-NACL 1000-0.7 MG/100ML-% IV SOLN
1000.0000 mg | INTRAVENOUS | Status: AC
  Administered 2022-09-17 (×2): 1000 mg via INTRAVENOUS

## 2022-09-17 MED ORDER — FENTANYL CITRATE (PF) 250 MCG/5ML IJ SOLN
INTRAMUSCULAR | Status: DC | PRN
  Administered 2022-09-17 (×3): 25 ug via INTRAVENOUS

## 2022-09-17 MED ORDER — TOBRAMYCIN SULFATE 1.2 G IJ SOLR
INTRAMUSCULAR | Status: DC | PRN
  Administered 2022-09-17: 4.8 g

## 2022-09-17 MED ORDER — PROPOFOL 500 MG/50ML IV EMUL
INTRAVENOUS | Status: DC | PRN
  Administered 2022-09-17: 60 ug/kg/min via INTRAVENOUS
  Administered 2022-09-17: 100 ug/kg/min via INTRAVENOUS

## 2022-09-17 MED ORDER — KETOROLAC TROMETHAMINE 15 MG/ML IJ SOLN
7.5000 mg | Freq: Four times a day (QID) | INTRAMUSCULAR | Status: AC
  Administered 2022-09-18 (×3): 7.5 mg via INTRAVENOUS
  Filled 2022-09-17 (×4): qty 1

## 2022-09-17 MED ORDER — TRANEXAMIC ACID-NACL 1000-0.7 MG/100ML-% IV SOLN
INTRAVENOUS | Status: AC
  Filled 2022-09-17: qty 100

## 2022-09-17 MED ORDER — METHOCARBAMOL 1000 MG/10ML IJ SOLN: 500.0000 mg | Freq: Four times a day (QID) | INTRAVENOUS | Status: DC | PRN

## 2022-09-17 MED ORDER — TRANEXAMIC ACID-NACL 1000-0.7 MG/100ML-% IV SOLN
1000.0000 mg | Freq: Once | INTRAVENOUS | Status: AC
  Administered 2022-09-17: 1000 mg via INTRAVENOUS
  Filled 2022-09-17: qty 100

## 2022-09-17 MED ORDER — MENTHOL 3 MG MT LOZG: 1.0000 | LOZENGE | OROMUCOSAL | Status: DC | PRN

## 2022-09-17 MED ORDER — FENTANYL CITRATE (PF) 100 MCG/2ML IJ SOLN
INTRAMUSCULAR | Status: AC
  Filled 2022-09-17: qty 2

## 2022-09-17 MED ORDER — DOCUSATE SODIUM 100 MG PO CAPS
100.0000 mg | ORAL_CAPSULE | Freq: Two times a day (BID) | ORAL | Status: DC
  Administered 2022-09-17 – 2022-09-24 (×14): 100 mg via ORAL
  Filled 2022-09-17 (×14): qty 1

## 2022-09-17 MED ORDER — INSULIN ASPART 100 UNIT/ML IJ SOLN
INTRAMUSCULAR | Status: AC
  Filled 2022-09-17: qty 1

## 2022-09-17 MED ORDER — ONDANSETRON HCL 4 MG/2ML IJ SOLN
4.0000 mg | Freq: Four times a day (QID) | INTRAMUSCULAR | Status: DC | PRN
  Administered 2022-09-21: 4 mg via INTRAVENOUS
  Filled 2022-09-17: qty 2

## 2022-09-17 MED ORDER — PHENYLEPHRINE HCL-NACL 20-0.9 MG/250ML-% IV SOLN
INTRAVENOUS | Status: DC | PRN
Start: 2022-09-17 — End: 2022-09-17
  Administered 2022-09-17: 40 ug/min via INTRAVENOUS

## 2022-09-17 MED ORDER — SURGIPHOR WOUND IRRIGATION SYSTEM - OPTIME
TOPICAL | Status: DC | PRN
  Administered 2022-09-17 (×2): 450 mL

## 2022-09-17 MED ORDER — METOCLOPRAMIDE HCL 5 MG PO TABS: 5.0000 mg | ORAL_TABLET | Freq: Three times a day (TID) | ORAL | Status: DC | PRN

## 2022-09-17 MED ORDER — METHOCARBAMOL 500 MG PO TABS
500.0000 mg | ORAL_TABLET | Freq: Four times a day (QID) | ORAL | Status: DC | PRN
  Administered 2022-09-17 – 2022-09-23 (×5): 500 mg via ORAL
  Filled 2022-09-17 (×5): qty 1

## 2022-09-17 MED ORDER — INSULIN ASPART 100 UNIT/ML IJ SOLN
0.0000 [IU] | INTRAMUSCULAR | Status: DC | PRN
  Administered 2022-09-17: 3 [IU] via SUBCUTANEOUS

## 2022-09-17 MED ORDER — FENTANYL CITRATE (PF) 100 MCG/2ML IJ SOLN
25.0000 ug | INTRAMUSCULAR | Status: DC | PRN
  Administered 2022-09-17 (×2): 50 ug via INTRAVENOUS

## 2022-09-17 MED ORDER — INSULIN ASPART 100 UNIT/ML IJ SOLN
0.0000 [IU] | Freq: Three times a day (TID) | INTRAMUSCULAR | Status: DC
  Administered 2022-09-18: 7 [IU] via SUBCUTANEOUS
  Administered 2022-09-18: 11 [IU] via SUBCUTANEOUS

## 2022-09-17 MED ORDER — PHENOL 1.4 % MT LIQD: 1.0000 | OROMUCOSAL | Status: DC | PRN

## 2022-09-17 MED ORDER — HYDROCODONE-ACETAMINOPHEN 7.5-325 MG PO TABS
1.0000 | ORAL_TABLET | ORAL | Status: DC | PRN
  Administered 2022-09-18 – 2022-09-22 (×2): 2 via ORAL
  Filled 2022-09-17 (×3): qty 2

## 2022-09-17 MED ORDER — CARVEDILOL 3.125 MG PO TABS
3.1250 mg | ORAL_TABLET | Freq: Two times a day (BID) | ORAL | Status: DC
  Administered 2022-09-18 – 2022-09-24 (×13): 3.125 mg via ORAL
  Filled 2022-09-17 (×14): qty 1

## 2022-09-17 MED ORDER — BUPIVACAINE-MELOXICAM ER 400-12 MG/14ML IJ SOLN
INTRAMUSCULAR | Status: AC
  Filled 2022-09-17: qty 1

## 2022-09-17 MED ORDER — SODIUM CHLORIDE 0.9 % IR SOLN
Status: DC | PRN
  Administered 2022-09-17 (×3): 3000 mL

## 2022-09-17 MED ORDER — GLIPIZIDE ER 10 MG PO TB24
10.0000 mg | ORAL_TABLET | Freq: Two times a day (BID) | ORAL | Status: DC
  Administered 2022-09-18 – 2022-09-19 (×2): 10 mg via ORAL
  Filled 2022-09-17 (×5): qty 1

## 2022-09-17 MED ORDER — CHLORHEXIDINE GLUCONATE 0.12 % MT SOLN
15.0000 mL | Freq: Once | OROMUCOSAL | Status: AC
  Administered 2022-09-17: 15 mL via OROMUCOSAL

## 2022-09-17 MED ORDER — HYDROCODONE-ACETAMINOPHEN 5-325 MG PO TABS
1.0000 | ORAL_TABLET | ORAL | Status: DC | PRN
  Administered 2022-09-17 – 2022-09-18 (×3): 2 via ORAL
  Filled 2022-09-17 (×3): qty 2

## 2022-09-17 MED ORDER — STERILE WATER FOR IRRIGATION IR SOLN
Status: DC | PRN
  Administered 2022-09-17: 1000 mL

## 2022-09-17 MED ORDER — SERTRALINE HCL 50 MG PO TABS
50.0000 mg | ORAL_TABLET | Freq: Every day | ORAL | Status: DC
  Administered 2022-09-18 – 2022-09-24 (×7): 50 mg via ORAL
  Filled 2022-09-17 (×7): qty 1

## 2022-09-17 MED ORDER — LIDOCAINE 2% (20 MG/ML) 5 ML SYRINGE
INTRAMUSCULAR | Status: AC
  Filled 2022-09-17: qty 5

## 2022-09-17 MED ORDER — MORPHINE SULFATE (PF) 2 MG/ML IV SOLN
0.5000 mg | INTRAVENOUS | Status: DC | PRN
  Administered 2022-09-17: 1 mg via INTRAVENOUS
  Filled 2022-09-17 (×3): qty 1

## 2022-09-17 MED ORDER — BUPIVACAINE-MELOXICAM ER 400-12 MG/14ML IJ SOLN
INTRAMUSCULAR | Status: DC | PRN
  Administered 2022-09-17: 400 mg

## 2022-09-17 MED ORDER — PROPOFOL 1000 MG/100ML IV EMUL
INTRAVENOUS | Status: AC
  Filled 2022-09-17: qty 100

## 2022-09-17 MED ORDER — INSULIN ASPART 100 UNIT/ML IJ SOLN
0.0000 [IU] | Freq: Every day | INTRAMUSCULAR | Status: DC
  Administered 2022-09-18: 2 [IU] via SUBCUTANEOUS

## 2022-09-17 MED ORDER — IRBESARTAN 150 MG PO TABS
75.0000 mg | ORAL_TABLET | Freq: Every day | ORAL | Status: DC
  Administered 2022-09-18 – 2022-09-24 (×7): 75 mg via ORAL
  Filled 2022-09-17 (×7): qty 1

## 2022-09-17 MED ORDER — FENTANYL CITRATE (PF) 250 MCG/5ML IJ SOLN
INTRAMUSCULAR | Status: AC
  Filled 2022-09-17: qty 5

## 2022-09-17 MED ORDER — 0.9 % SODIUM CHLORIDE (POUR BTL) OPTIME
TOPICAL | Status: DC | PRN
  Administered 2022-09-17: 1000 mL

## 2022-09-17 SURGICAL SUPPLY — 90 items
BAG COUNTER SPONGE SURGICOUNT (BAG) ×1 IMPLANT
BAG DECANTER FOR FLEXI CONT (MISCELLANEOUS) ×1 IMPLANT
BAG SPNG CNTER NS LX DISP (BAG) ×1
BLADE AVERAGE 25X9 (BLADE) ×1 IMPLANT
BLADE CLIPPER SURG (BLADE) IMPLANT
BLADE OSTEOTOME VER FLAT 11X50 (ORTHOPEDIC DISPOSABLE SUPPLIES) IMPLANT
BLADE SAG 18X100X1.27 (BLADE) ×1 IMPLANT
BLADE SAGITTAL 25.0X1.27X90 (BLADE) ×1 IMPLANT
BLADE SAW SGTL 13X75X1.27 (BLADE) IMPLANT
BLADE SURG 10 STRL SS (BLADE) ×4 IMPLANT
BNDG CMPR MED 10X6 ELC LF (GAUZE/BANDAGES/DRESSINGS)
BNDG CMPR MED 15X6 ELC VLCR LF (GAUZE/BANDAGES/DRESSINGS) ×1
BNDG ELASTIC 6X10 VLCR STRL LF (GAUZE/BANDAGES/DRESSINGS) IMPLANT
BNDG ELASTIC 6X15 VLCR STRL LF (GAUZE/BANDAGES/DRESSINGS) IMPLANT
BOWL SMART MIX CTS (DISPOSABLE) IMPLANT
CANISTER WOUND CARE 500ML ATS (WOUND CARE) IMPLANT
CANISTER WOUNDNEG PRESSURE 500 (CANNISTER) IMPLANT
CEMENT BONE R 1X40 (Cement) IMPLANT
CLSR STERI-STRIP ANTIMIC 1/2X4 (GAUZE/BANDAGES/DRESSINGS) IMPLANT
CNTNR URN SCR LID CUP LEK RST (MISCELLANEOUS) IMPLANT
COMP TIB CMT NEXGEN 4 17 LR (Joint) ×1 IMPLANT
COMPONENT TIB CMT NXGN 4 17 LR (Joint) IMPLANT
CONT SPEC 4OZ STRL OR WHT (MISCELLANEOUS) ×1
COOLER ICEMAN CLASSIC (MISCELLANEOUS) ×1 IMPLANT
COVER SURGICAL LIGHT HANDLE (MISCELLANEOUS) ×1 IMPLANT
CUFF TOURN SGL QUICK 34 (TOURNIQUET CUFF)
CUFF TOURN SGL QUICK 42 (TOURNIQUET CUFF) IMPLANT
CUFF TRNQT CYL 34X4.125X (TOURNIQUET CUFF) IMPLANT
DRAPE EXTREMITY T 121X128X90 (DISPOSABLE) ×1 IMPLANT
DRAPE INCISE IOBAN 66X45 STRL (DRAPES) IMPLANT
DRAPE ORTHO SPLIT 77X108 STRL (DRAPES) ×2
DRAPE POUCH INSTRU U-SHP 10X18 (DRAPES) ×1 IMPLANT
DRAPE SURG ORHT 6 SPLT 77X108 (DRAPES) ×2 IMPLANT
DRESSING PEEL AND PLAC PRVNA20 (GAUZE/BANDAGES/DRESSINGS) IMPLANT
DRSG AQUACEL AG ADV 3.5X10 (GAUZE/BANDAGES/DRESSINGS) IMPLANT
DRSG PEEL AND PLACE PREVENA 20 (GAUZE/BANDAGES/DRESSINGS) ×1
DURAPREP 26ML APPLICATOR (WOUND CARE) ×2 IMPLANT
ELECT CAUTERY BLADE 6.4 (BLADE) ×1 IMPLANT
ELECT PENCIL ROCKER SW 15FT (MISCELLANEOUS) IMPLANT
ELECT REM PT RETURN 9FT ADLT (ELECTROSURGICAL) ×1
ELECTRODE REM PT RTRN 9FT ADLT (ELECTROSURGICAL) ×1 IMPLANT
FEMUR CMT CR STD SZ 6 LT KNEE (Joint) ×1 IMPLANT
FEMUR CMTD CR STD SZ 6 LT KNEE (Joint) IMPLANT
GLOVE BIOGEL PI IND STRL 7.0 (GLOVE) IMPLANT
GLOVE ECLIPSE 7.0 STRL STRAW (GLOVE) ×1 IMPLANT
GLOVE INDICATOR 7.0 STRL GRN (GLOVE) ×1 IMPLANT
GLOVE INDICATOR 7.5 STRL GRN (GLOVE) ×1 IMPLANT
GLOVE SURG SYN 7.5 E (GLOVE) ×4 IMPLANT
GLOVE SURG SYN 7.5 PF PI (GLOVE) ×4 IMPLANT
GOWN STRL SURGICAL XL XLNG (GOWN DISPOSABLE) ×2 IMPLANT
GOWN TOGA ZIPPER T7+ PEEL AWAY (MISCELLANEOUS) ×2 IMPLANT
HANDPIECE INTERPULSE COAX TIP (DISPOSABLE) ×1
HEMOSTAT ARISTA ABSORB 3G PWDR (HEMOSTASIS) IMPLANT
HOOD PEEL AWAY T7 (MISCELLANEOUS) ×1 IMPLANT
IMMOBILIZER KNEE 22 UNIV (SOFTGOODS) IMPLANT
KIT BASIN OR (CUSTOM PROCEDURE TRAY) ×1 IMPLANT
KIT TURNOVER KIT B (KITS) ×1 IMPLANT
MANIFOLD NEPTUNE II (INSTRUMENTS) ×1 IMPLANT
MARKER SKIN DUAL TIP RULER LAB (MISCELLANEOUS) ×1 IMPLANT
NDL SPNL 18GX3.5 QUINCKE PK (NEEDLE) ×1 IMPLANT
NEEDLE SPNL 18GX3.5 QUINCKE PK (NEEDLE) ×1 IMPLANT
NS IRRIG 1000ML POUR BTL (IV SOLUTION) ×1 IMPLANT
PACK TOTAL JOINT (CUSTOM PROCEDURE TRAY) ×1 IMPLANT
PAD ARMBOARD 7.5X6 YLW CONV (MISCELLANEOUS) ×2 IMPLANT
PAD COLD SHLDR WRAP-ON (PAD) ×1 IMPLANT
SAW OSC TIP CART 19.5X105X1.3 (SAW) IMPLANT
SET HNDPC FAN SPRY TIP SCT (DISPOSABLE) IMPLANT
SOLUTION PRONTOSAN WOUND 350ML (IRRIGATION / IRRIGATOR) ×1 IMPLANT
SPONGE T-LAP 18X18 ~~LOC~~+RFID (SPONGE) IMPLANT
STEM POLY PAT PLY 32M KNEE (Knees) IMPLANT
SUCTION TUBE FRAZIER 10FR DISP (SUCTIONS) ×1 IMPLANT
SUT ETHILON 2 0 FS 18 (SUTURE) ×2 IMPLANT
SUT MNCRL AB 3-0 PS2 27 (SUTURE) IMPLANT
SUT MON AB 2-0 CT1 36 (SUTURE) IMPLANT
SUT MON AB 2-0 SH 27 (SUTURE) ×2
SUT MON AB 2-0 SH27 (SUTURE) IMPLANT
SUT PDS AB 0 CT 36 (SUTURE) IMPLANT
SUT PDS AB 1 CTX 36 (SUTURE) IMPLANT
SUT VIC AB 0 CT1 27 (SUTURE) ×1
SUT VIC AB 0 CT1 27XBRD ANBCTR (SUTURE) ×1 IMPLANT
SUT VIC AB 1 CTX 36 (SUTURE) ×2
SUT VIC AB 1 CTX36XBRD ANBCTR (SUTURE) ×2 IMPLANT
SUT VIC AB 2-0 CT1 27 (SUTURE) ×2
SUT VIC AB 2-0 CT1 TAPERPNT 27 (SUTURE) ×2 IMPLANT
SYR 50ML LL SCALE MARK (SYRINGE) ×1 IMPLANT
TOWEL GREEN STERILE (TOWEL DISPOSABLE) ×1 IMPLANT
TOWEL GREEN STERILE FF (TOWEL DISPOSABLE) ×1 IMPLANT
TRAY FOLEY MTR SLVR 16FR STAT (SET/KITS/TRAYS/PACK) IMPLANT
TUBE SUCT ARGYLE STRL (TUBING) ×1 IMPLANT
WATER STERILE IRR 1000ML POUR (IV SOLUTION) IMPLANT

## 2022-09-17 NOTE — Anesthesia Procedure Notes (Signed)
Anesthesia Regional Block: Adductor canal block   Pre-Anesthetic Checklist: , timeout performed,  Correct Patient, Correct Site, Correct Laterality,  Correct Procedure, Correct Position, site marked,  Risks and benefits discussed,  Surgical consent,  Pre-op evaluation,  At surgeon's request and post-op pain management  Laterality: Lower and Left  Prep: chloraprep       Needles:  Injection technique: Single-shot  Needle Type: Stimiplex     Needle Length: 9cm  Needle Gauge: 21     Additional Needles:   Procedures:,,,, ultrasound used (permanent image in chart),,    Narrative:  Start time: 09/17/2022 2:45 PM End time: 09/17/2022 3:05 PM Injection made incrementally with aspirations every 5 mL.  Performed by: Personally  Anesthesiologist: Lewie Loron, MD  Additional Notes: BP cuff, EKG monitors applied. Sedation begun. Artery and nerve location verified with ultrasound. Anesthetic injected incrementally (5ml), slowly, and after negative aspirations under direct u/s guidance. Good fascial/perineural spread. Tolerated well.

## 2022-09-17 NOTE — H&P (Signed)
PREOPERATIVE H&P  Chief Complaint: left total knee prosthetic joint infection  HPI: Shelly [name] is a 82 y.o. female who presents for surgical treatment of left total knee prosthetic joint infection.  She denies any changes in medical history.  Past Medical History:  Diagnosis Date   Arthritis    "qwhere" (07/01/2012)   Asthma    normal chest x-ray in 2010   Basal cell carcinoma of face    Breast cancer (HCC)    Cholelithiasis    Chronic lower back pain    Degenerative joint disease    Right TKA-2010; low back pain; hands and hips also affected   Exertional shortness of breath    "mostly from being too fat" (07/01/2012)   GERD (gastroesophageal reflux disease)    history   H/O hiatal hernia    Heart murmur    small   Hemorrhoid    HTN (hypertension)    Hyperlipidemia    Macular degeneration    "both eyes" (07/01/2012)   PUD (peptic ulcer disease) 2003   Upper GI bleed-gastric ulcer; gastroesophageal reflux disease   Seasonal allergies    Type II diabetes mellitus (HCC)    Past Surgical History:  Procedure Laterality Date   APPENDECTOMY     BREAST LUMPECTOMY Left    BREAST LUMPECTOMY WITH RADIOACTIVE SEED LOCALIZATION Left 10/21/2017   Procedure: BREAST LUMPECTOMY WITH RADIOACTIVE SEED LOCALIZATION;  Surgeon: Claud Kelp, MD;  Location: [facility];  Service: General;  Laterality: Left;   CATARACT EXTRACTION BILATERAL W/ ANTERIOR VITRECTOMY Bilateral    EYE SURGERY Bilateral    "laser in eyes to stop bleeding and injections for macular degeneration" (07/01/2012)   FOOT SURGERY Bilateral    straighten 1st toe with a wedge.   KNEE ARTHROSCOPY Bilateral    SKIN CANCER EXCISION  2013   "face" (07/01/2012)   TOTAL KNEE ARTHROPLASTY Right 2011   TOTAL KNEE ARTHROPLASTY Left 07/01/2012   TOTAL KNEE ARTHROPLASTY Left 07/01/2012   Procedure: LEFT TOTAL KNEE ARTHROPLASTY;  Surgeon: Valeria Batman, MD;  Location: [facility] OR;  Service: Orthopedics;   Laterality: Left;  Left Total Knee Arthroplasty   Social History   Socioeconomic History   Marital status: Divorced    Spouse name: Not on file   Number of children: 2   Years of education: Not on file   Highest education level: Not on file  Occupational History   Not on file  Tobacco Use   Smoking status: Former    Current packs/day: 0.00    Average packs/day: 2.5 packs/day for 27.0 years (67.5 ttl pk-yrs)    Types: Cigarettes    Start date: 02/12/1954    Quit date: 02/12/1981    Years since quitting: 41.6   Smokeless tobacco: Never  Vaping Use   Vaping status: Never Used  Substance and Sexual Activity   Alcohol use: No   Drug use: No   Sexual activity: Not Currently  Other Topics Concern   Not on file  Social History Narrative   Lives locally. Works part time at the [employer].    Social Determinants of Health   Financial Resource Strain: Not on file  Food Insecurity: Not on file  Transportation Needs: Not on file  Physical Activity: Not on file  Stress: Not on file  Social Connections: Not on file   Family History  Problem Relation Age of Onset   Hodgkin's lymphoma Mother    Cervical cancer Sister  half sister mom's side   Lung cancer Sister    Breast cancer Neg Hx    Allergies  Allergen Reactions   Celebrex [Celecoxib] Other (See Comments)    Abdominal pain; history of upper GI bleed    Claritin [Loratadine] Hives and Swelling    blisters   Penicillins Hives   Percocet [Oxycodone-Acetaminophen] Other (See Comments)    Severe constipation   Prior to Admission medications   Medication Sig Start Date End Date Taking? Authorizing Provider  acetaminophen (TYLENOL) 650 MG CR tablet Take 650 mg by mouth 2 (two) times daily.   Yes [provider]  albuterol (VENTOLIN HFA) 108 (90 Base) MCG/ACT inhaler Inhale 2 puffs into the lungs every 6 (six) hours as needed for shortness of breath.    Yes [provider]   carvedilol (COREG) 3.125 MG tablet Take 3.125 mg by mouth 2 (two) times daily. Dose reduction per patient   Yes [provider]  cetirizine (ZYRTEC) 10 MG tablet Take 10 mg by mouth daily as needed for allergies.   Yes [provider]  cholecalciferol (VITAMIN D3) 25 MCG (1000 UNIT) tablet Take 1,000 Units by mouth daily.   Yes [provider]  Cyanocobalamin (B-12 COMPLIANCE INJECTION) 1000 MCG/ML KIT Inject 1 mL as directed every 30 (thirty) days. 04/17/22  Yes Cook, Jayce G, DO  cyclobenzaprine (FLEXERIL) 5 MG tablet Take 5 mg by mouth 3 (three) times daily as needed for muscle spasms.   Yes [provider]  doxycycline (VIBRA-TABS) 100 MG tablet Take 1 tablet (100 mg total) by mouth 2 (two) times daily. 09/12/22 10/12/22 Yes Tarry Kos, MD  furosemide (LASIX) 20 MG tablet Take 1 tablet (20 mg total) by mouth daily as needed for edema. Patient taking differently: Take 20 mg by mouth 2 (two) times a week. Mon and Thurs 08/01/22  Yes Strader, [place] M, PA-C  glipiZIDE (GLUCOTROL XL) 10 MG 24 hr tablet Take 10 mg by mouth 2 (two) times daily. 08/23/22  Yes [provider]  linagliptin (TRADJENTA) 5 MG TABS tablet Take 1 tablet (5 mg total) by mouth daily. 06/04/22  Yes Cook, Verdis Frederickson, DO  Multiple Vitamins-Minerals (PRESERVISION AREDS PO) Take 1 tablet by mouth 2 (two) times daily.    Yes [provider]  pravastatin (PRAVACHOL) 10 MG tablet Take 10 mg by mouth every evening.   Yes [provider]  sertraline (ZOLOFT) 50 MG tablet Take 1 tablet (50 mg total) by mouth daily. 06/04/22  Yes Cook, Jayce G, DO  traMADol (ULTRAM) 50 MG tablet Take 1 tablet (50 mg total) by mouth every 12 (twelve) hours as needed for moderate pain or severe pain. 07/17/22  Yes Cook, Jayce G, DO  Turmeric 450 MG CAPS Take 450 mg by mouth daily.   Yes [provider]  valsartan (DIOVAN) 80 MG tablet Take 1 tablet (80 mg total) by mouth daily. 07/17/22  Yes Cook,  Jayce G, DO  cephALEXin (KEFLEX) 250 MG capsule Take 1 capsule (250 mg total) by mouth 4 (four) times daily. Patient not taking: Reported on 09/13/2022 09/05/22   Oliver Barre, MD  lidocaine (XYLOCAINE) 5 % ointment Apply 1 Application topically 3 (three) times daily as needed. 05/21/22   Raspet, Erin K, PA-C     Positive ROS: All other systems have been reviewed and were otherwise negative with the exception of those mentioned in the HPI and as above.  Physical Exam: General: Alert, no acute distress Cardiovascular: No  pedal edema Respiratory: No cyanosis, no use of accessory musculature GI: abdomen soft Skin: No lesions in the area of chief complaint Neurologic: Sensation intact distally Psychiatric: Patient is competent for consent with normal mood and affect Lymphatic: no lymphedema  MUSCULOSKELETAL: exam stable  Assessment: left total knee prosthetic joint infection  Plan: Plan for Procedure(s): LEFT KNEE RESECTION ARTHROPLASTY, REVISION TOTAL KNEE ARTHROPLASTY  The risks benefits and alternatives were discussed with the patient including but not limited to the risks of nonoperative treatment, versus surgical intervention including infection, bleeding, nerve injury,  blood clots, cardiopulmonary complications, morbidity, mortality, among others, and they were willing to proceed.   Glee Arvin, MD 09/17/2022 3:22 PM

## 2022-09-17 NOTE — Anesthesia Procedure Notes (Signed)
Spinal  Patient location during procedure: OR Reason for block: surgical anesthesia Staffing Performed: anesthesiologist  Anesthesiologist: Lewie Loron, MD Performed by: Lewie Loron, MD Authorized by: Lewie Loron, MD   Preanesthetic Checklist Completed: patient identified, IV checked, site marked, risks and benefits discussed, surgical consent, monitors and equipment checked, pre-op evaluation and timeout performed Spinal Block Patient position: sitting Prep: DuraPrep and site prepped and draped Patient monitoring: heart rate, continuous pulse ox and blood pressure Approach: right paramedian Location: L4-5 Injection technique: single-shot Needle Needle type: Spinocan  Needle gauge: 25 G Needle length: 9 cm Additional Notes Expiration date of kit checked and confirmed. Patient tolerated procedure well, without complications.

## 2022-09-17 NOTE — Op Note (Signed)
Total Knee Arthroplasty Procedure Note  Preoperative diagnosis: Chronic prosthetic left knee infection  Postoperative diagnosis:same  Operative findings: Prosthetic joint infection Loose tibial component  Operative procedure:  Revision of both femoral, tibial, and patellar components of left total knee arthroplasty Application of incisional VAC  Surgeon: N. Glee Arvin, MD  Assist: Oneal Grout, PA-C; necessary for the timely completion of procedure and due to complexity of procedure.  Anesthesia: Spinal, regional, local  Tourniquet time: 120 minutes  Implants used: Zimmer Femur: Persona CR 6 Tibia: NexGen all poly 17 mm Patella: 32 mm  Indication: Shelly [name] is a 82 y.o. year old female with a history of chronic prosthetic joint infection of the left knee. She underwent index left total knee replacement over 10 years ago by Dr. Cleophas Dunker.  Recently she had been falling due to knee pain and presented to urgent care a couple of times and given antibiotics.  She was seen by Dr. Dallas Schimke and he aspirated the knee which revealed >100k of WBCs.  Given her age and baseline functional level, I recommended resection arthroplasty with placement of spacer as a permanent solution rather than undergoing a two stage revision.    Procedure:  After informed consent was obtained and understanding of the risk were voiced including but not limited to bleeding, infection, damage to surrounding structures including nerves and vessels, blood clots, leg length inequality and the failure to achieve desired results, the operative extremity was marked with verbal confirmation of the patient in the holding area.   The patient was then brought to the operating room and transported to the operating room table in the supine position.  A tourniquet was applied to the operative extremity around the upper thigh. The operative limb was then prepped and draped in the usual sterile fashion and  preoperative antibiotics were administered.  A time out was performed prior to the start of surgery confirming the correct extremity, preoperative antibiotic administration, as well as team members, implants and instruments available for the case. Correct surgical site was also confirmed with preoperative radiographs. Previous surgical scar was incised.  Full-thickness skin flaps were sharply elevated medially and laterally.  Purulent tissue and fluid was encountered towards the medial and lateral proximal tibia.  This was cultured.  The infected tissue was sharply debrided with a rongeur.  The previous Ethibond sutures were removed with a clean blade and a medial parapatellar arthrotomy was created.  The medial capsule was sharply elevated off of the medial tibial plateau.  There was some tissue loss due to the infection.  A portion of the lateral capsule from the lateral tibial plateau was also lost due to debridement.  The infrapatellar fat pad was excised.  Circumferential synovectomy was performed.  A quadriceps snip was not necessary.  A limited lateral release was performed in order to gain adequate lateral patellar mobilization.  The patella was then everted.  The poly component was tamped off of the metal base with an osteotome.  An automated pneumatic osteotome was used to disrupt the implant cement interface.  The patellar bone remained intact with minimal bone loss.  An oscillating saw was used to make a freshen up cut.  Lug drill was also used to freshen the 3 holes.  Patellar protector was placed and the patella was subluxed laterally.  The knee was brought into flexion and the tibia was subluxed anteriorly.  A three-quarter inch osteotome was used to truncate the rotating peg of the polyethylene liner and the  liner was extracted without any difficulty.  Additional soft tissue releases were performed to gain adequate exposure.  The tibial component was grossly loose.  Again I used the automated  pneumatic osteotome device to disrupt the implant cement interface and the tibial baseplate was removed very easily without any loss to the bone.  The tibial cutting guide was attached to the leg and a freshen up cut was made.  Attention was then turned to the femur.  The femoral component was well-fixed.  The automated pneumatic osteotome device was used to disrupt the implant cement interface and the femoral component was extracted with minimal bone loss to the medial condyle.  I then used a combination of drills, rondure, Morlan back scratcher's to remove any remaining cement.  The medullary this device was placed on the femur and a freshen up cut was made over the distal femur.  We decided that a #6 femur was the appropriate size.  A number six 4-in-1 block was then pinned to the distal femur and the appropriate cuts were made.  Trial components were placed and we determined that a CR 6 femur with a 17 mm all poly tibia was the appropriate construct.  However I did find that a 17 mm all poly tibia was too loose in extension and flexion but that was the maximum thickness for the all poly tibia.  I felt that she needed an additional 5 mm.  32 mm patella was best for her anatomy.  Thorough debridement of the bone and the surrounding soft tissues was carried out.  Once the bony surfaces were prepared we thoroughly irrigated the knee with 3 L of normal saline and 1 L of dilute Betadine solution.  The dirty instruments were then passed off the table and all surgical members regowned and gloved.  Fresh instruments were used for reimplantation.  2 separate batches of cement were mixed on the back table with each batch containing 2 g of vancomycin and 1.2 g of tobramycin with 3 liquid monomers.  The mixture was vacuum mixed.  Cement dowels were made for the tibial and femoral canals.  The final implants were cemented into place.  On the tibial side cemented the poly tibia in with approximately 5 additional millimeters of  cement in order to improve stability to the collaterals in both extension and flexion.  The femoral patellar components were impacted down as usual.  The cement was allowed to harden.  Excess cement was removed.  I was happy with the tension of the collaterals throughout her range of motion.  The joint was again thoroughly irrigated with a liter of dilute Betadine solution and 3 L of normal saline.  The arthrotomy was closed with interrupted #1 PDS.  Subcutaneous fat closed with 0 PDS.  Subcuticular layer closed with 2-0 Monocryl and the skin was closed with 2-0 nylon.  Incisional VAC was placed on top. The patient was awakened in the operating room and taken to recovery in stable condition. All sponge, needle, and instrument counts were correct at the end of the case.  Tessa Lerner was necessary for opening, closing, retracting, limb positioning and overall facilitation and completion of the surgery.  Position: supine  Complications: none.  Time Out: performed   Drains/Packing: none  Estimated blood loss: minimal  Returned to Recovery Room: in good condition.   Antibiotics: yes   Mechanical VTE (DVT) Prophylaxis: sequential compression devices, TED thigh-high  Chemical VTE (DVT) Prophylaxis: aspirin  Fluid Replacement  Crystalloid: see anesthesia  record Blood: none  FFP: none   Specimens Removed: 1 to pathology   Sponge and Instrument Count Correct? yes   PACU: portable radiograph - knee AP and Lateral   Plan/RTC: Return in 2 weeks for wound check.   Weight Bearing/Load Lower Extremity: weight bearing as tolerated in knee immobilizer  Implant Name Type Inv. Item Serial No. Manufacturer Lot No. LRB No. Used Action  FEMUR DEPUY [id number] - [id number] Orthopedic Implant COMP FEM CEM STD LT LCS  DEPUY SYNTHES [id number] Left 1 Explanted  INSERT DEPUY [id number] - [id number] Knees INSERT TIB LCS RP STD 15  DEPUY SYNTHES [id number] Left 1 Explanted  PATELLA 3 PEG DEPUY [id number] - [id number]  Knees COMP PATELLA PEGX3 CEM STAN  DEPUY SYNTHES [id number] Left 1 Explanted  TIBIA MBT CEMENT [id number] - [id number] Knees TIBIA MBT CEMENT  DEPUY SYNTHES [id number] Left 1 Explanted  CEMENT HV SMART SET 3092-040 - [id number] Cement CEMENT HV SMART SET  DEPUY SYNTHES [id number] Left 2 Explanted  STEM POLY PAT PLY 75M KNEE - [id number] Knees STEM POLY PAT PLY 75M KNEE  ZIMMER RECON(ORTH,TRAU,BIO,SG) [id number] Left 1 Implanted  FEMUR CMT CR STD SZ 6 LT KNEE - [id number] Joint FEMUR CMT CR STD SZ 6 LT KNEE  ZIMMER RECON(ORTH,TRAU,BIO,SG) [id number] Left 1 Implanted  CR Stemmed Tibial Component    ZIMMER [id number] Left 1 Implanted  CEMENT BONE R 1X40 - [id number] Cement CEMENT BONE R 1X40  ZIMMER RECON(ORTH,TRAU,BIO,SG) [id number] Left 4 Implanted  CEMENT BONE R 1X40 - [id number] Cement CEMENT BONE R 1X40  ZIMMER RECON(ORTH,TRAU,BIO,SG) [id number] Left 2 Implanted  Stemmed Tibial Component    ZIMMER [id number] Left 1 Implanted    N. Glee Arvin, MD [facility] 8:33 PM

## 2022-09-17 NOTE — Anesthesia Postprocedure Evaluation (Signed)
Anesthesia Post Note  Patient: Shelly [name]  Procedure(s) Performed: LEFT KNEE RESECTION ARTHROPLASTY, REVISION TOTAL KNEE ARTHROPLASTY (Left: Knee)     Patient location during evaluation: PACU Anesthesia Type: Spinal Level of consciousness: awake and alert Pain management: pain level controlled Vital Signs Assessment: post-procedure vital signs reviewed and stable Respiratory status: spontaneous breathing and respiratory function stable Cardiovascular status: blood pressure returned to baseline and stable Postop Assessment: spinal receding and no apparent nausea or vomiting Anesthetic complications: no   No notable events documented.  Last Vitals:  Vitals:   09/17/22 2115 09/17/22 2130  BP: 100/70 (!) 127/58  Pulse: 74 71  Resp: (!) 24 (!) 32  Temp:  36.6 C  SpO2: 98% 93%    Last Pain:  Vitals:   09/17/22 2130  PainSc: Asleep                 Beryle Lathe

## 2022-09-17 NOTE — Discharge Instructions (Signed)

## 2022-09-17 NOTE — Transfer of Care (Signed)
Immediate Anesthesia Transfer of Care Note  Patient: Shelly [name]  Procedure(s) Performed: LEFT KNEE RESECTION ARTHROPLASTY, REVISION TOTAL KNEE ARTHROPLASTY (Left: Knee)  Patient Location: PACU  Anesthesia Type:Spinal  Level of Consciousness: drowsy  Airway & Oxygen Therapy: Patient Spontanous Breathing and Patient connected to nasal cannula oxygen  Post-op Assessment: Report given to RN and Post -op Vital signs reviewed and stable  Post vital signs: Reviewed and stable  Last Vitals:  Vitals Value Taken Time  BP 113/61 09/17/22 2005  Temp    Pulse 67 09/17/22 2006  Resp 22 09/17/22 2006  SpO2 98 % 09/17/22 2006  Vitals shown include unfiled device data.  Last Pain:  Vitals:   09/17/22 1428  PainSc: 0-No pain         Complications: No notable events documented.

## 2022-09-18 ENCOUNTER — Other Ambulatory Visit: Payer: Self-pay

## 2022-09-18 LAB — GLUCOSE, CAPILLARY
Glucose-Capillary: 259 mg/dL — ABNORMAL HIGH (ref 70–99)
Glucose-Capillary: 114 mg/dL — ABNORMAL HIGH (ref 70–99)
Glucose-Capillary: 243 mg/dL — ABNORMAL HIGH (ref 70–99)
Glucose-Capillary: 220 mg/dL — ABNORMAL HIGH (ref 70–99)

## 2022-09-18 MED ORDER — VANCOMYCIN HCL 2000 MG/400ML IV SOLN
2000.0000 mg | Freq: Once | INTRAVENOUS | Status: AC
  Administered 2022-09-18: 2000 mg via INTRAVENOUS
  Filled 2022-09-18: qty 400

## 2022-09-18 MED ORDER — VANCOMYCIN HCL 750 MG/150ML IV SOLN
750.0000 mg | Freq: Two times a day (BID) | INTRAVENOUS | Status: DC
  Filled 2022-09-18: qty 150

## 2022-09-18 MED ORDER — HYDROCODONE-ACETAMINOPHEN 5-325 MG PO TABS: 1.0000 | ORAL_TABLET | Freq: Four times a day (QID) | ORAL | 0 refills | Status: DC | PRN

## 2022-09-18 MED ORDER — VANCOMYCIN HCL 750 MG/150ML IV SOLN: 750.0000 mg | INTRAVENOUS | Status: DC

## 2022-09-18 MED ORDER — SODIUM CHLORIDE 0.9 % IV SOLN
2.0000 g | Freq: Two times a day (BID) | INTRAVENOUS | Status: DC
  Administered 2022-09-18: 2 g via INTRAVENOUS
  Filled 2022-09-18: qty 12.5

## 2022-09-18 MED ORDER — ASPIRIN 81 MG PO CHEW: 81.0000 mg | CHEWABLE_TABLET | Freq: Two times a day (BID) | ORAL | 0 refills | Status: DC

## 2022-09-18 MED ORDER — CEFAZOLIN SODIUM-DEXTROSE 2-4 GM/100ML-% IV SOLN
2.0000 g | Freq: Three times a day (TID) | INTRAVENOUS | Status: DC
  Administered 2022-09-18 – 2022-09-24 (×18): 2 g via INTRAVENOUS
  Filled 2022-09-18 (×18): qty 100

## 2022-09-18 NOTE — Consult Note (Signed)
[facility]  Total days of antibiotics 2       Reason for Consult: MSSA left knee PJI    Referring Physician: Roda Shutters  Principal Problem:   Infection of prosthetic left knee joint (HCC) Active Problems:   Status post total left knee replacement   Status post revision of total replacement of left knee    HPI: Shelly [name] is a 82 y.o. female  with hx of bilateral TKA, T2DM, HTN, Asthma, hx of breast ca, also legally blind, who started to notice left knee pain and swelling over several months. She was seen by her PCP and local orthopedist in early April and last may after sustaining a fall for concern of hardware loosening. She then started to have erythema, and swelling to her left knee in late May. She eventually did have arthrocentesis for which synovial fluid culture on 09/05/22 showed to have MSSA septic arthritis/PJI and started on keflex 250mg  po QID. She was then referred to Dr Roda Shutters for further surgical management. She was admitted on 8/5Patient was admitted on 09/17/22 for left knee resection arthroplasty, and revision of TKA(femoral,, tibial, patellar components)  POD#2 doing well. Did have episode of symptomatic hypoglycemia this morning.  Lives alone but family nearby.    Past Medical History:  Diagnosis Date   Arthritis    "qwhere" (07/01/2012)   Asthma    normal chest x-ray in 2010   Basal cell carcinoma of face    Breast cancer (HCC)    Cholelithiasis    Chronic lower back pain    Degenerative joint disease    Right TKA-2010; low back pain; hands and hips also affected   Exertional shortness of breath    "mostly from being too fat" (07/01/2012)   GERD (gastroesophageal reflux disease)    history   H/O hiatal hernia    Heart murmur    small   Hemorrhoid    HTN (hypertension)    Hyperlipidemia    Macular degeneration    "both eyes" (07/01/2012)   PUD (peptic ulcer disease) 2003   Upper GI bleed-gastric ulcer; gastroesophageal reflux disease    Seasonal allergies    Type II diabetes mellitus (HCC)     Allergies:  Allergies  Allergen Reactions   Celebrex [Celecoxib] Other (See Comments)    Abdominal pain; history of upper GI bleed    Claritin [Loratadine] Hives and Swelling    blisters   Penicillins Hives   Percocet [Oxycodone-Acetaminophen] Other (See Comments)    Severe constipation    MEDICATIONS:  aspirin  81 mg Oral BID   carvedilol  3.125 mg Oral BID WC   docusate sodium  100 mg Oral BID   ferrous sulfate  325 mg Oral TID PC   glipiZIDE  10 mg Oral BID WC   insulin aspart  0-20 Units Subcutaneous TID WC   insulin aspart  0-5 Units Subcutaneous QHS   irbesartan  75 mg Oral Daily   ketorolac  7.5 mg Intravenous Q6H   linagliptin  5 mg Oral Daily   sertraline  50 mg Oral Daily    Social History   Tobacco Use   Smoking status: Former    Current packs/day: 0.00    Average packs/day: 2.5 packs/day for 27.0 years (67.5 ttl pk-yrs)    Types: Cigarettes    Start date: 02/12/1954    Quit date: 02/12/1981    Years since quitting: 41.6   Smokeless tobacco: Never  Vaping Use  Vaping status: Never Used  Substance Use Topics   Alcohol use: No   Drug use: No    Family History  Problem Relation Age of Onset   Hodgkin's lymphoma Mother    Cervical cancer Sister        half sister mom's side   Lung cancer Sister    Breast cancer Neg Hx     Review of Systems -  +left knee pain. Jittery from hypoglycemia this morning. Otherwise 12 point ros is negative  OBJECTIVE: Temp:  [97.6 F (36.4 C)-98 F (36.7 C)] 97.6 F (36.4 C) (08/06 1300) Pulse Rate:  [66-92] 79 (08/06 1300) Resp:  [14-32] 18 (08/06 1300) BP: (100-138)/(44-70) 104/44 (08/06 1300) SpO2:  [92 %-99 %] 92 % (08/06 1300) Weight:  [91.6 kg] 91.6 kg (08/06 0119) Physical Exam  Constitutional:  oriented to person, place, and time. appears well-developed and well-nourished. No distress.  HENT: Ler[place]/AT, PERRLA, no scleral icterus Mouth/Throat:  Oropharynx is clear and moist. No oropharyngeal exudate.  Cardiovascular: Normal rate, regular rhythm and normal heart sounds. Exam reveals no gallop and no friction rub.  No murmur heard.  Pulmonary/Chest: Effort normal and breath sounds normal. No respiratory distress.  has no wheezes.  Neck = supple, no nuchal rigidity Abdominal: Soft. Bowel sounds are normal.  exhibits no distension. There is no tenderness.  Lymphadenopathy: no cervical adenopathy. No axillary adenopathy QMV:HQIO knee is immobilized still has wound vac in place Neurological: alert and oriented to person, place, and time.  Skin: Skin is warm and dry. No rash noted. No erythema.  Psychiatric: a normal mood and affect.  behavior is normal.    LABS: Results for orders placed or performed during the hospital encounter of 09/17/22 (from the past 48 hour(s))  Glucose, capillary     Status: Abnormal   Collection Time: 09/17/22  2:06 PM  Result Value Ref Range   Glucose-Capillary 229 (H) 70 - 99 mg/dL    Comment: Glucose reference range applies only to samples taken after fasting for at least 8 hours.  CBC     Status: Abnormal   Collection Time: 09/17/22  2:30 PM  Result Value Ref Range   WBC 7.1 4.0 - 10.5 K/uL   RBC 4.58 3.87 - 5.11 MIL/uL   Hemoglobin 9.7 (L) 12.0 - 15.0 g/dL   HCT 96.2 (L) 95.2 - 84.1 %   MCV 74.2 (L) 80.0 - 100.0 fL   MCH 21.2 (L) 26.0 - 34.0 pg   MCHC 28.5 (L) 30.0 - 36.0 g/dL   RDW 32.4 (H) 40.1 - 02.7 %   Platelets 508 (H) 150 - 400 K/uL   nRBC 0.0 0.0 - 0.2 %    Comment: Performed at [facility], [address] [place], [place] [address]  Basic metabolic panel     Status: Abnormal   Collection Time: 09/17/22  2:30 PM  Result Value Ref Range   Sodium 134 (L) 135 - 145 mmol/L   Potassium 4.3 3.5 - 5.1 mmol/L   Chloride 100 98 - 111 mmol/L   CO2 23 22 - 32 mmol/L   Glucose, Bld 184 (H) 70 - 99 mg/dL    Comment: Glucose reference range applies only to samples taken after fasting for  at least 8 hours.   BUN 37 (H) 8 - 23 mg/dL   Creatinine, Ser 4.40 (H) 0.44 - 1.00 mg/dL   Calcium 8.7 (L) 8.9 - 10.3 mg/dL   GFR, Estimated 52 (L) >60 mL/min  Comment: (NOTE) Calculated using the CKD-EPI Creatinine Equation (2021)    Anion gap 11 5 - 15    Comment: Performed at [facility], [address], [place], [place] [address]  Type and screen [facility]     Status: None   Collection Time: 09/17/22  2:55 PM  Result Value Ref Range   ABO/RH(D) O NEG    Antibody Screen NEG    Sample Expiration      09/20/2022,2359 Performed at [facility], [address], [place], [place] [address]   Surgical pcr screen     Status: None   Collection Time: 09/17/22  3:38 PM   Specimen: Nasal Mucosa; Nasal Swab  Result Value Ref Range   MRSA, PCR NEGATIVE NEGATIVE   Staphylococcus aureus NEGATIVE NEGATIVE    Comment: (NOTE) The Xpert SA Assay (FDA approved for NASAL specimens in patients 83 years of age and older), is one component of a comprehensive surveillance program. It is not intended to diagnose infection nor to guide or monitor treatment. Performed at [facility], [address], [place], [place] [address]   Glucose, capillary     Status: Abnormal   Collection Time: 09/17/22  3:59 PM  Result Value Ref Range   Glucose-Capillary 136 (H) 70 - 99 mg/dL    Comment: Glucose reference range applies only to samples taken after fasting for at least 8 hours.  Aerobic/Anaerobic Culture w Gram Stain (surgical/deep wound)     Status: None (Preliminary result)   Collection Time: 09/17/22  5:31 PM   Specimen: Soft Tissue, Other  Result Value Ref Range   Specimen Description TISSUE LEFT KNEE    Special Requests NONE    Gram Stain      FEW WBC PRESENT, PREDOMINANTLY PMN NO ORGANISMS SEEN    Culture      NO GROWTH < 12 HOURS Performed at [facility], [address] [place], [place] [address]    Report Status PENDING   Aerobic/Anaerobic  Culture w Gram Stain (surgical/deep wound)     Status: None (Preliminary result)   Collection Time: 09/17/22  5:32 PM   Specimen: Soft Tissue, Other  Result Value Ref Range   Specimen Description WOUND LEFT KNEE    Special Requests NONE    Gram Stain      RARE WBC PRESENT,BOTH PMN AND MONONUCLEAR NO ORGANISMS SEEN    Culture      NO GROWTH < 12 HOURS Performed at [facility], [address], [place], [place] [address]    Report Status PENDING   Glucose, capillary     Status: Abnormal   Collection Time: 09/17/22  9:11 PM  Result Value Ref Range   Glucose-Capillary 108 (H) 70 - 99 mg/dL    Comment: Glucose reference range applies only to samples taken after fasting for at least 8 hours.  Glucose, capillary     Status: Abnormal   Collection Time: 09/17/22 10:03 PM  Result Value Ref Range   Glucose-Capillary 138 (H) 70 - 99 mg/dL    Comment: Glucose reference range applies only to samples taken after fasting for at least 8 hours.  CBC     Status: Abnormal   Collection Time: 09/18/22 12:57 AM  Result Value Ref Range   WBC 12.1 (H) 4.0 - 10.5 K/uL   RBC 3.96 3.87 - 5.11 MIL/uL   Hemoglobin 8.4 (L) 12.0 - 15.0 g/dL    Comment: Reticulocyte Hemoglobin  testing may be clinically indicated, consider ordering this additional test [id number]    HCT 29.6 (L) 36.0 - 46.0 %   MCV 74.7 (L) 80.0 - 100.0 fL   MCH 21.2 (L) 26.0 - 34.0 pg   MCHC 28.4 (L) 30.0 - 36.0 g/dL   RDW 56.2 (H) 13.0 - 86.5 %   Platelets 434 (H) 150 - 400 K/uL   nRBC 0.0 0.0 - 0.2 %    Comment: Performed at [facility], [address] [place], [place] [address]  Glucose, capillary     Status: Abnormal   Collection Time: 09/18/22  7:59 AM  Result Value Ref Range   Glucose-Capillary 259 (H) 70 - 99 mg/dL    Comment: Glucose reference range applies only to samples taken after fasting for at least 8 hours.  Glucose, capillary     Status: Abnormal   Collection Time: 09/18/22 11:34 AM  Result Value Ref  Range   Glucose-Capillary 114 (H) 70 - 99 mg/dL    Comment: Glucose reference range applies only to samples taken after fasting for at least 8 hours.  Glucose, capillary     Status: Abnormal   Collection Time: 09/18/22  4:16 PM  Result Value Ref Range   Glucose-Capillary 243 (H) 70 - 99 mg/dL    Comment: Glucose reference range applies only to samples taken after fasting for at least 8 hours.    MICRO: 8/5 micro pending  Historic micro:    Component 2 wk ago  MICRO NUMBER: [id number]  SPECIMEN QUALITY: Adequate  SOURCE: FLUID, SYNOVIAL  STATUS: FINAL  GRAM STAIN: Few Polymorphonuclear leukocytes No organisms seen  ISOLATE 1: Staphylococcus aureus Panic   Comment: Staphylococcus aureus  Resulting Agency [facility]     Susceptibility   Staphylococcus aureus    AEROBIC CULT, GRAM STAIN POSITIVE 1    CIPROFLOXACIN <=0.5 Sensitive    CLINDAMYCIN <=0.25 Sensitive    ERYTHROMYCIN <=0.25 Sensitive    GENTAMICIN <=0.5 Sensitive    LEVOFLOXACIN <=0.12 Sensitive    OXACILLIN <=0.25 Sensitive 1    TETRACYCLINE <=1 Sensitive    TRIMETH/SULFA <=10 Sensitive 2    VANCOMYCIN <=0.5 Sensitive        IMAGING: DG Knee Left Port  Result Date: 09/17/2022 CLINICAL DATA:  Postop in PACU. EXAM: PORTABLE LEFT KNEE - 1-2 VIEW COMPARISON:  Radiograph 09/12/2022 FINDINGS: The previous patellar arthroplasty component has been removed. The tibial component has been removed with underlying cement. The femoral component may be new. No fracture. Recent postsurgical change includes air and edema in the soft tissues and joint space. Presumed wound VAC in place. IMPRESSION: Postsurgical change of the left knee. No immediate postoperative complication. Electronically Signed   By: Narda Rutherford M.D.   On: 09/17/2022 21:36    HISTORICAL MICRO/IMAGING  Assessment/Plan:  82yo F with remote left knee TKA now with HW failure likely due to MSSA PJI she is POD#2 s/p arthroplasty explant and new  TKA - 1 staged revision -will check sed rate and crp - will need picc line - change abtx to cefazolin 2gm IV Q 8hr with plan to treat for 6 wk - follow up on culture results to see if anything needs to change - will need SNF placement since she is unable to due abtx on her own due to blindness  T2DM with hypoglycemia = continue to monitor, if having repeated episode, would decrease basal rate of insulin  Leukocytosis = likely still elevated due to infection/post op  stress - will continue to monitor --------------------------------------------------------------------------- Diagnosis: Left knee PJI  Culture Result: MSSA  Allergies  Allergen Reactions   Celebrex [Celecoxib] Other (See Comments)    Abdominal pain; history of upper GI bleed    Claritin [Loratadine] Hives and Swelling    blisters   Penicillins Hives   Percocet [Oxycodone-Acetaminophen] Other (See Comments)    Severe constipation    OPAT Orders Discharge antibiotics to be given via PICC line Discharge antibiotics: Per pharmacy protocol  cefazolin 2gm IV Q 8hr  Duration: 6 wk End Date: 10/29/22  [facility] Care Per Protocol:  Home health RN for IV administration and teaching; PICC line care and labs.    Labs weekly while on IV antibiotics: __x CBC with differential __x BMP __ CMP _x_ CRP _x_ ESR   _x_ Please pull PIC at completion of IV antibiotics  Fax weekly labs to [phone]  Clinic Follow Up Appt: In 4-6 wk with DR Drue Second at [facility]. Drue Second MD MPH [facility] [phone]

## 2022-09-18 NOTE — Plan of Care (Signed)

## 2022-09-18 NOTE — Progress Notes (Addendum)
Pharmacy Antibiotic Note  Shelly [name] is a 82 y.o. female admitted on 09/17/2022 with prosthetic joint infection.  Pharmacy has been consulted for cefepime and vancomycin dosing.   Patient with history of chronic PJI of the left knee. Patient recently had a falling and presented to urgent care for pain. Underwent aspiration and revelaed >100k WBCs and underwent resection arthroplasty with placement of spacer.   WBC 7.1 >12.1, sCr 1.08 (bl~<1), afebrile  Plan: Cefepime 2g IV every 12 hours Vancomycin 750 mg IV every 24 hours (AUC 504.6, IBW, Vd 0.5) Monitor renal function  Follow up LOT, de-escalation, cultures, signs of clinical improvement  Height: 5\' 3"  (160 cm) Weight: 91.6 kg (202 lb) IBW/kg (Calculated) : 52.4  Temp (24hrs), Avg:98 F (36.7 C), Min:97.8 F (36.6 C), Max:98.2 F (36.8 C)  Recent Labs  Lab 09/17/22 1430 09/18/22 0057  WBC 7.1 12.1*  CREATININE 1.08*  --     Estimated Creatinine Clearance: 43.9 mL/min (A) (by C-G formula based on SCr of 1.08 mg/dL (H)).    Allergies  Allergen Reactions   Celebrex [Celecoxib] Other (See Comments)    Abdominal pain; history of upper GI bleed    Claritin [Loratadine] Hives and Swelling    blisters   Penicillins Hives   Percocet [Oxycodone-Acetaminophen] Other (See Comments)    Severe constipation    Antimicrobials this admission: Cefepime 8/6 >>  Vancomycin 8/5 >>   Microbiology results: 8/5 WoundCx:    Thank you for allowing pharmacy to be a part of this patient's care.  Arabella Merles, PharmD. Clinical Pharmacist 09/18/2022 6:24 AM

## 2022-09-18 NOTE — Progress Notes (Signed)
Subjective: 1 Day Post-Op Procedure(s) (LRB): LEFT KNEE RESECTION ARTHROPLASTY, REVISION TOTAL KNEE ARTHROPLASTY (Left) Patient reports pain as mild.    Objective: Vital signs in last 24 hours: Temp:  [97.8 F (36.6 C)-98.2 F (36.8 C)] 98 F (36.7 C) (08/06 0753) Pulse Rate:  [66-92] 83 (08/06 0753) Resp:  [14-32] 18 (08/06 0753) BP: (100-138)/(45-70) 113/56 (08/06 0753) SpO2:  [93 %-99 %] 96 % (08/06 0753) Weight:  [91.6 kg] 91.6 kg (08/06 0119)  Intake/Output from previous day: 08/05 0701 - 08/06 0700 In: 1306.3 [I.V.:1006.3; IV Piggyback:300] Out: 200 [Blood:200] Intake/Output this shift: No intake/output data recorded.  Recent Labs    09/17/22 1430 09/18/22 0057  HGB 9.7* 8.4*   Recent Labs    09/17/22 1430 09/18/22 0057  WBC 7.1 12.1*  RBC 4.58 3.96  HCT 34.0* 29.6*  PLT 508* 434*   Recent Labs    09/17/22 1430  NA 134*  K 4.3  CL 100  CO2 23  BUN 37*  CREATININE 1.08*  GLUCOSE 184*  CALCIUM 8.7*   No results for input(s): "LABPT", "INR" in the last 72 hours.  Neurologically intact Neurovascular intact Sensation intact distally Intact pulses distally Dorsiflexion/Plantar flexion intact Incision: dressing C/D/I No cellulitis present Compartment soft Ivac in place with good seal.  No fluid in canister   Assessment/Plan: 1 Day Post-Op Procedure(s) (LRB): LEFT KNEE RESECTION ARTHROPLASTY, REVISION TOTAL KNEE ARTHROPLASTY (Left) Advance with diet Up with PT.  WBAT LLE.  Must wear knee immobilizer when  bearing weight Please swap out ivac hospital unit with portable prevena day of discharge, not sooner.  ABLA- BP soft.  Will continue iv fluids and reorder cbc tomorrow am Intra-op cx ngtd- continue cefipime and vancomycin       Cristie Hem 09/18/2022, 8:07 AM

## 2022-09-18 NOTE — Plan of Care (Signed)

## 2022-09-18 NOTE — Progress Notes (Signed)
Orthopedic Tech Progress Note Patient Details:  Shelly [name] 01-04-1941 [id number]  Patient already had KNEE IMMOBILIZER   Patient ID: Shelly [name], female   DOB: 1940-07-10, 82 y.o.   MRN: [id number]  Shelly [name] 09/18/2022, 11:07 AM

## 2022-09-18 NOTE — Evaluation (Signed)
Occupational Therapy Evaluation Patient Details Name: Shelly [name] MRN: [id number] DOB: 01-25-1941 Today's Date: 09/18/2022   History of Present Illness Patient is 82 y.o. female s/p Lt TKR to femoral, tibial, and patellar components on 09/17/22 due to prosthetic join infection. PMH significant for OA, asthma, breast cnacer, GERD, HTN, HLD, DM2, macular degeneration, Rt TKA in 2011, Lt TKA in 2014.   Clinical Impression   Pt admitted with the above diagnosis. Pt currently with functional limitations due to the deficits listed below (see OT Problem List). Prior to admit, pt was living at home with grandson. Pt reports that she was mod I for all BADL tasks and functional mobility using Rolator for functional mobility.  Pt will benefit from acute skilled OT to increase their safety and independence with ADL and functional mobility for ADL to facilitate discharge. If family is able to provide frequent assistance and assist PRN, pt would be able to return home. Recommending Home Health OT as follow  up services to increase pt's independence with functional ADL tasks and increase overall safety within her home. OT will continue to follow acutely.        Recommendations for follow up therapy are one component of a multi-disciplinary discharge planning process, led by the attending physician.  Recommendations may be updated based on patient status, additional functional criteria and insurance authorization.   Assistance Recommended at Discharge Intermittent Supervision/Assistance  Patient can return home with the following A little help with walking and/or transfers;A lot of help with bathing/dressing/bathroom;Assistance with cooking/housework;Help with stairs or ramp for entrance;Assist for transportation    Functional Status Assessment  Patient has had a recent decline in their functional status and demonstrates the ability to make significant improvements in function in a reasonable and predictable  amount of time.  Equipment Recommendations  BSC/3in1       Precautions / Restrictions Precautions Precautions: Fall Required Braces or Orthoses: Knee Immobilizer - Left Knee Immobilizer - Left: On when out of bed or walking Restrictions Weight Bearing Restrictions: No      Mobility Bed Mobility Overal bed mobility: Needs Assistance Bed Mobility: Supine to Sit     Supine to sit: Min guard, HOB elevated     General bed mobility comments: VC provided for use of bed rail and for sequencing as needed.    Transfers Overall transfer level: Needs assistance Equipment used: Rolling walker (2 wheels) Transfers: Sit to/from Stand Sit to Stand: Min assist           General transfer comment: VC for hand placement with RW management. OT managed IV pole and wound vac attached to RW. Increased time need to power up into standing. VC to push down into RW with BUE when stepping forward with her Right LE.      Balance Overall balance assessment: History of Falls, Needs assistance Sitting-balance support: Single extremity supported, Feet supported Sitting balance-Leahy Scale: Fair Sitting balance - Comments: sitting EOB   Standing balance support: Bilateral upper extremity supported, During functional activity, Reliant on assistive device for balance Standing balance-Leahy Scale: Poor            ADL either performed or assessed with clinical judgement   ADL  09/18/22 1048  ADL  Overall ADL's  Needs assistance/impaired  Eating/Feeding Modified independent;Sitting  Grooming Wash/dry hands;Set up;Standing  Upper Body Bathing Set up;Sitting  Lower Body Bathing Minimal assistance;Sitting/lateral leans;Sit to/from stand  Upper Body Dressing  Minimal assistance;Sitting  Lower Body Dressing Minimal assistance;Sitting/lateral leans;Sit to/from stand  Music therapist for safety;Ambulation;BSC/3in1;Regular Toilet (BSC over toilet)  Toileting- Clothing Manipulation  and Hygiene Minimal assistance;Cueing for safety;Cueing for sequencing;Sitting/lateral lean;Sit to/from stand         Vision Baseline Vision/History: 6 Macular Degeneration;1 Wears glasses Ability to See in Adequate Light: 2 Moderately impaired Patient Visual Report: No change from baseline Vision Assessment?: No apparent visual deficits            Pertinent Vitals/Pain Pain Assessment Pain Assessment: Faces Faces Pain Scale: Hurts a little bit Pain Location: Lt knee Pain Descriptors / Indicators: Discomfort     Hand Dominance Right   Extremity/Trunk Assessment Upper Extremity Assessment Upper Extremity Assessment: Generalized weakness   Lower Extremity Assessment Lower Extremity Assessment: Defer to PT evaluation   Cervical / Trunk Assessment Cervical / Trunk Assessment: Other exceptions (body habitus)   Communication Communication Communication: No difficulties   Cognition Arousal/Alertness: Awake/alert Behavior During Therapy: WFL for tasks assessed/performed Overall Cognitive Status: Within Functional Limits for tasks assessed           General Comments  Ace wrapped and surgical dressing on LLE with wound vac attached and on.            Home Living Family/patient expects to be discharged to:: Private residence Living Arrangements: Alone;Other relatives (grandson) Available Help at Discharge: Family;Available PRN/intermittently (cameras set up in home) Type of Home: House Home Access: Ramped entrance     Home Layout: One level;Laundry or work area in basement     [employer] Shower/Tub: Producer, television/film/video: Administrator Accessibility: Yes   Home Equipment: Agricultural consultant (2 wheels);Rollator (4 wheels);Shower seat          Prior Functioning/Environment Prior Level of Function : Independent/Modified Independent             Mobility Comments: pt reports use of Rollator for mobility in home          OT Problem List:  Decreased strength;Decreased activity tolerance;Impaired balance (sitting and/or standing);Decreased safety awareness;Pain      OT Treatment/Interventions:   Self-care/home management    OT Goals(Current goals can be found in the care plan section) Acute Rehab OT Goals Patient Stated Goal: to get stronger OT Goal Formulation: Patient unable to participate in goal setting Time For Goal Achievement: 10/02/22 Potential to Achieve Goals: Good  OT Frequency: Min 1X/week    Co-evaluation PT/OT/SLP Co-Evaluation/Treatment: Yes Reason for Co-Treatment: To address functional/ADL transfers   OT goals addressed during session: Proper use of Adaptive equipment and DME;ADL's and self-care      AM-PAC OT "6 Clicks" Daily Activity     Outcome Measure Help from another person eating meals?: None Help from another person taking care of personal grooming?: A Little Help from another person toileting, which includes using toliet, bedpan, or urinal?: A Little Help from another person bathing (including washing, rinsing, drying)?: A Little Help from another person to put on and taking off regular upper body clothing?: A Little Help from another person to put on and taking off regular lower body clothing?: A Little 6 Click Score: 19   End of Session Equipment Utilized During Treatment: Rolling walker (2 wheels);Gait belt Nurse Communication: Mobility status  Activity Tolerance: Patient tolerated treatment well Patient left: in chair;with call bell/phone within reach;with chair alarm set  OT Visit Diagnosis: Unsteadiness on feet (R26.81);Muscle weakness (generalized) (M62.81);History of falling (Z91.81)                Time: 1007-1040 OT  Time Calculation (min): 33 min Charges:  OT General Charges $OT Visit: 1 Visit OT Evaluation $OT Eval Moderate Complexity: 1 Mod  [employer], OTR/L,CBIS  Supplemental OT - [facility] and [facility] Secure Chat Preferred    , Shelly [name] 09/18/2022, 11:01 AM

## 2022-09-18 NOTE — Evaluation (Signed)
Physical Therapy Evaluation Patient Details Name: Shelly [name] MRN: [id number] DOB: 1940/02/13 Today's Date: 09/18/2022  History of Present Illness  Patient is 82 y.o. female s/p Lt TKR to femoral, tibial, and patellar components on 09/17/22 due to prosthetic join infection. PMH significant for OA, asthma, breast cnacer, GERD, HTN, HLD, DM2, macular degeneration, Rt TKA in 2011, Lt TKA in 2014.   Clinical Impression  Shelly [name] is 82 y.o. female admitted with above HPI and diagnosis. Patient is currently limited by functional impairments below (see PT problem list). Patient lives with grandson and his wife and reports independence with rollator for mobility at baseline. Patient requires min assist for transfers with cues for technique/hand placement with RW. Pt amb to bathroom and back to recliner with min assist to guide RW and cues for safe proximity. Min assist to steady throughout. Patient will benefit from continued skilled PT interventions to address impairments and progress independence with mobility. Acute PT will follow and progress as able.         If plan is discharge home, recommend the following: A little help with walking and/or transfers;A little help with bathing/dressing/bathroom;Assistance with cooking/housework;Direct supervision/assist for medications management;Assist for transportation;Help with stairs or ramp for entrance   Can travel by private vehicle   Yes    Equipment Recommendations None recommended by PT  Recommendations for Other Services       Functional Status Assessment Patient has had a recent decline in their functional status and demonstrates the ability to make significant improvements in function in a reasonable and predictable amount of time.     Precautions / Restrictions Precautions Precautions: Fall Required Braces or Orthoses: Knee Immobilizer - Left Knee Immobilizer - Left: On when out of bed or walking Restrictions Weight Bearing  Restrictions: No      Mobility  Bed Mobility Overal bed mobility: Needs Assistance Bed Mobility: Supine to Sit     Supine to sit: Min guard, HOB elevated          Transfers Overall transfer level: Needs assistance Equipment used: Rolling walker (2 wheels) Transfers: Sit to/from Stand Sit to Stand: Min assist          Cues for hand placement/technique with RW. pt requires assist to fully power up and steady with stand.       Ambulation/Gait Ambulation/Gait assistance: Min assist Gait Distance (Feet): 12 Feet (2x12) Assistive device: Rolling walker (2 wheels)    Decr velocity Gait Details: Step-through pattern;Decreased step length - right;Decreased step length - left;Decreased stride length;Shuffle;Trunk flexed;  Commnets: cues for proximity to RW and min assist to steady/stabilize balance. Cues for increased pressure through RW during Lt LE stance to reduce pressure.       Stairs            Wheelchair Mobility     Tilt Bed    Modified Rankin (Stroke Patients Only)       Balance Overall balance assessment: Needs assistance, History of Falls Sitting-balance support: Feet supported, Single extremity supported, No upper extremity supported Sitting balance-Leahy Scale: Fair Sitting balance - Comments: slight posterior lean   Standing balance support: Reliant on assistive device for balance, Bilateral upper extremity supported, During functional activity Standing balance-Leahy Scale: Poor                               Pertinent Vitals/Pain Pain Assessment Pain Assessment: Faces Faces Pain Scale: Hurts a little bit  Pain Location: Lt knee Pain Descriptors / Indicators: Discomfort Pain Intervention(s): Limited activity within patient's tolerance, Monitored during session, Repositioned    Home Living Family/patient expects to be discharged to:: Private residence Living Arrangements: Alone;Other relatives (grandson) Available Help at  Discharge: Family;Available PRN/intermittently (cameras set up in home) Type of Home: House Home Access: Ramped entrance       Home Layout: One level;Laundry or work area in [employer] Equipment: Agricultural consultant (2 wheels);Rollator (4 wheels);Shower seat      Prior Function Prior Level of Function : Independent/Modified Independent             Mobility Comments: pt reports use of Rollator for mobility in home       Hand Dominance   Dominant Hand: Right    Extremity/Trunk Assessment   Upper Extremity Assessment Upper Extremity Assessment: Generalized weakness    Lower Extremity Assessment Lower Extremity Assessment: Defer to PT evaluation LLE: Unable to fully assess due to immobilization LLE Sensation: WNL LLE Coordination: WNL    Cervical / Trunk Assessment Cervical / Trunk Assessment: Other exceptions (body habitus) Cervical / Trunk Exceptions: habitus  Communication   Communication: No difficulties  Cognition Arousal/Alertness: Awake/alert Behavior During Therapy: WFL for tasks assessed/performed Overall Cognitive Status: Within Functional Limits for tasks assessed                                 General Comments: pt demonstrates some decreased awareness of safety and deficits. pt expressing preference to return home despite limited assistance available.        General Comments General comments (skin integrity, edema, etc.): Ace wrapped and surgical dressing on LLE with wound vac attached and on.    Exercises General Exercises - Lower Extremity Ankle Circles/Pumps: AROM, Both, 10 reps Quad Sets: AROM, Left, 5 reps   Assessment/Plan    PT Assessment Patient needs continued PT services  PT Problem List Decreased strength;Decreased range of motion;Decreased activity tolerance;Decreased balance;Decreased mobility;Decreased coordination;Decreased cognition;Decreased knowledge of use of DME;Decreased safety awareness;Decreased knowledge of  precautions;Obesity;Pain       PT Treatment Interventions DME instruction;Gait training;Stair training;Functional mobility training;Therapeutic activities;Therapeutic exercise;Balance training;Neuromuscular re-education;Cognitive remediation;Patient/family education;Wheelchair mobility training    PT Goals (Current goals can be found in the Care Plan section)  Acute Rehab PT Goals Patient Stated Goal: return home PT Goal Formulation: With patient Time For Goal Achievement: 10/02/22 Potential to Achieve Goals: Good    Frequency Min 1X/week     Co-evaluation   Reason for Co-Treatment: To address functional/ADL transfers   OT goals addressed during session: Proper use of Adaptive equipment and DME;ADL's and self-care       AM-PAC PT "6 Clicks" Mobility  Outcome Measure Help needed turning from your back to your side while in a flat bed without using bedrails?: A Little Help needed moving from lying on your back to sitting on the side of a flat bed without using bedrails?: A Little Help needed moving to and from a bed to a chair (including a wheelchair)?: A Little Help needed standing up from a chair using your arms (e.g., wheelchair or bedside chair)?: A Little Help needed to walk in hospital room?: A Little Help needed climbing 3-5 steps with a railing? : Total 6 Click Score: 16    End of Session Equipment Utilized During Treatment: Gait belt Activity Tolerance: Patient tolerated treatment well Patient left: in chair;with call bell/phone within reach;with chair alarm  set Nurse Communication: Mobility status PT Visit Diagnosis: Unsteadiness on feet (R26.81);Muscle weakness (generalized) (M62.81);Difficulty in walking, not elsewhere classified (R26.2);Pain Pain - Right/Left: Left Pain - part of body: Knee    Time: 1001-1041 PT Time Calculation (min) (ACUTE ONLY): 40 min   Charges:   PT Evaluation $PT Eval Moderate Complexity: 1 Mod PT Treatments $Gait Training: 8-22  mins PT General Charges $$ ACUTE PT VISIT: 1 Visit         Wynn Maudlin, DPT Acute Rehabilitation Services Office [phone]  09/18/22 11:08 AM

## 2022-09-18 NOTE — Progress Notes (Signed)
Pharmacy Antibiotic Note  Shelly [name] is a 82 y.o. female admitted on 09/17/2022 with prosthetic joint infection.  Pharmacy has been consulted for cefepime and vancomycin dosing.   Patient with history of chronic PJI of the left knee. Patient recently had a falling and presented to urgent care for pain. Underwent aspiration and revelaed >100k WBCs and underwent resection arthroplasty with placement of spacer.   WBC 7.1 >12.1, sCr 1.08 (bl~<1), afebrile  Patient underwent L knee revision with spacer placement 8/5, and only has received Vancomycin powder during her operation.    Plan: Cefepime 2g IV every 12 hours LOAD vancomycin 2,000 mg IV x1 , followed by Vancomycin 750 mg IV every 24 hours (AUC 504.6, IBW, Vd 0.5) Monitor renal function  Follow up LOT, de-escalation, cultures, signs of clinical improvement  Height: 5\' 3"  (160 cm) Weight: 91.6 kg (202 lb) IBW/kg (Calculated) : 52.4  Temp (24hrs), Avg:98 F (36.7 C), Min:97.8 F (36.6 C), Max:98.2 F (36.8 C)  Recent Labs  Lab 09/17/22 1430 09/18/22 0057  WBC 7.1 12.1*  CREATININE 1.08*  --     Estimated Creatinine Clearance: 43.9 mL/min (A) (by C-G formula based on SCr of 1.08 mg/dL (H)).    Allergies  Allergen Reactions   Celebrex [Celecoxib] Other (See Comments)    Abdominal pain; history of upper GI bleed    Claritin [Loratadine] Hives and Swelling    blisters   Penicillins Hives   Percocet [Oxycodone-Acetaminophen] Other (See Comments)    Severe constipation    Antimicrobials this admission: Cefepime 8/6 >>  Vancomycin 8/5 >>   Microbiology results: 8/5 WoundCx: NGTD    Thank you for allowing pharmacy to be a part of this patient's care.  Jani Gravel, PharmD Clinical Pharmacist  09/18/2022 9:31 AM

## 2022-09-19 ENCOUNTER — Ambulatory Visit: Payer: PPO | Admitting: Orthopedic Surgery

## 2022-09-19 ENCOUNTER — Encounter (HOSPITAL_COMMUNITY): Payer: Self-pay | Admitting: Orthopaedic Surgery

## 2022-09-19 ENCOUNTER — Other Ambulatory Visit: Payer: Self-pay

## 2022-09-19 DIAGNOSIS — T8450XA Infection and inflammatory reaction due to unspecified internal joint prosthesis, initial encounter: Secondary | ICD-10-CM

## 2022-09-19 DIAGNOSIS — A4901 Methicillin susceptible Staphylococcus aureus infection, unspecified site: Secondary | ICD-10-CM | POA: Diagnosis not present

## 2022-09-19 DIAGNOSIS — Z96652 Presence of left artificial knee joint: Secondary | ICD-10-CM | POA: Diagnosis not present

## 2022-09-19 LAB — GLUCOSE, CAPILLARY
Glucose-Capillary: 51 mg/dL — ABNORMAL LOW (ref 70–99)
Glucose-Capillary: 83 mg/dL (ref 70–99)
Glucose-Capillary: 39 mg/dL — CL (ref 70–99)
Glucose-Capillary: 71 mg/dL (ref 70–99)
Glucose-Capillary: 122 mg/dL — ABNORMAL HIGH (ref 70–99)
Glucose-Capillary: 100 mg/dL — ABNORMAL HIGH (ref 70–99)

## 2022-09-19 LAB — SEDIMENTATION RATE: Sed Rate: 32 mm/hr — ABNORMAL HIGH (ref 0–22)

## 2022-09-19 LAB — C-REACTIVE PROTEIN: CRP: 8.3 mg/dL — ABNORMAL HIGH (ref <1.0)

## 2022-09-19 MED ORDER — CHLORHEXIDINE GLUCONATE CLOTH 2 % EX PADS
6.0000 | MEDICATED_PAD | Freq: Every day | CUTANEOUS | Status: DC
  Administered 2022-09-19 – 2022-09-24 (×6): 6 via TOPICAL

## 2022-09-19 MED ORDER — SODIUM CHLORIDE 0.9% FLUSH
10.0000 mL | Freq: Two times a day (BID) | INTRAVENOUS | Status: DC
  Administered 2022-09-19 – 2022-09-24 (×7): 10 mL

## 2022-09-19 MED ORDER — SODIUM CHLORIDE 0.9% FLUSH: 10.0000 mL | INTRAVENOUS | Status: DC | PRN

## 2022-09-19 MED ORDER — CEFAZOLIN IV (FOR PTA / DISCHARGE USE ONLY): 2.0000 g | Freq: Three times a day (TID) | INTRAVENOUS | Status: AC

## 2022-09-19 NOTE — Progress Notes (Signed)
PHARMACY CONSULT NOTE FOR:  OUTPATIENT  PARENTERAL ANTIBIOTIC THERAPY (OPAT)  Indication: MSSA L-knee PJI Regimen: Cefazolin 2g IV every 8 hours End date: 10/29/22 (6 weeks from OR 8/5)  IV antibiotic discharge orders are pended. To discharging provider:  please sign these orders via discharge navigator,  Select New Orders & click on the button choice - Manage This Unsigned Work.     Thank you for allowing pharmacy to be a part of this patient's care.  Georgina Pillion, PharmD, BCPS, BCIDP Infectious Diseases Clinical Pharmacist 09/19/2022 2:17 PM   **Pharmacist phone directory can now be found on [email or web address] ([email or web address]).  Listed under [facility].

## 2022-09-19 NOTE — Progress Notes (Addendum)
Peripherally Inserted Central Catheter Placement  The IV Nurse has discussed with the patient and/or persons authorized to consent for the patient, the purpose of this procedure and the potential benefits and risks involved with this procedure.  The benefits include less needle sticks, lab draws from the catheter, and the patient may be discharged home with the catheter. Risks include, but not limited to, infection, bleeding, blood clot (thrombus formation), and puncture of an artery; nerve damage and irregular heartbeat and possibility to perform a PICC exchange if needed/ordered by physician.  Alternatives to this procedure were also discussed.  Bard Power PICC patient education guide, fact sheet on infection prevention and patient information card has been provided to patient /or left at bedside.  ECG Site [employer] and placed on patient chart.  PICC Placement Documentation  PICC Single Lumen 09/19/22 Right Basilic 38 cm 0 cm (Active)  Indication for Insertion or Continuance of Line Home intravenous therapies (PICC only) 09/19/22 1831  Exposed Catheter (cm) 0 cm 09/19/22 1831  Site Assessment Clean, Dry, Intact 09/19/22 1831  Line Status Flushed;Saline locked;Blood return noted 09/19/22 1831  Dressing Type Transparent;Securing device 09/19/22 1831  Dressing Status Antimicrobial disc in place 09/19/22 1831  Line Care Connections checked and tightened 09/19/22 1831  Line Adjustment (NICU/IV Team Only) No 09/19/22 1831  Dressing Intervention New dressing 09/19/22 1831  Dressing Change Due 09/26/22 09/19/22 1831       Elenore Paddy 09/19/2022, 6:31 PM

## 2022-09-19 NOTE — Progress Notes (Signed)
Subjective: 2 Days Post-Op Procedure(s) (LRB): LEFT KNEE RESECTION ARTHROPLASTY, REVISION TOTAL KNEE ARTHROPLASTY (Left) Patient reports pain as mild.    Objective: Vital signs in last 24 hours: Temp:  [97.6 F (36.4 C)-98.4 F (36.9 C)] 98.1 F (36.7 C) (08/07 0501) Pulse Rate:  [79-98] 87 (08/07 0723) Resp:  [17-18] 18 (08/07 0723) BP: (100-128)/(44-66) 128/64 (08/07 0723) SpO2:  [92 %-96 %] 94 % (08/07 0723)  Intake/Output from previous day: 08/06 0701 - 08/07 0700 In: 300 [IV Piggyback:300] Out: 55 [Drains:55] Intake/Output this shift: No intake/output data recorded.  Recent Labs    09/17/22 1430 09/18/22 0057 09/19/22 0048  HGB 9.7* 8.4* 7.5*   Recent Labs    09/18/22 0057 09/19/22 0048  WBC 12.1* 13.6*  RBC 3.96 3.54*  HCT 29.6* 26.1*  PLT 434* 435*   Recent Labs    09/17/22 1430 09/19/22 0048  NA 134* 131*  K 4.3 5.3*  CL 100 96*  CO2 23 23  BUN 37* 45*  CREATININE 1.08* 1.53*  GLUCOSE 184* 132*  CALCIUM 8.7* 8.9   No results for input(s): "LABPT", "INR" in the last 72 hours.  Neurologically intact Neurovascular intact Sensation intact distally Intact pulses distally Dorsiflexion/Plantar flexion intact Incision: dressing C/D/I No cellulitis present Compartment soft Ivac in place with good seal.  50 ml blood in canister   Assessment/Plan: 2 Days Post-Op Procedure(s) (LRB): LEFT KNEE RESECTION ARTHROPLASTY, REVISION TOTAL KNEE ARTHROPLASTY (Left) Up with therapy Up with PT.  WBAT LLE.  Must wear knee immobilizer when  bearing weight Please swap out ivac hospital unit with portable prevena day of discharge, not sooner.  ABLA- BP soft.  Will continue iv fluids and reorder cbc tomorrow am Intra-op cx ngtd- continue cefipime and vancomycin Plan to d/c to SNF once cultures finalize, picc placed and recs given per ID    Cristie Hem 09/19/2022, 7:57 AM

## 2022-09-19 NOTE — Inpatient Diabetes Management (Addendum)
Inpatient Diabetes Program Recommendations  AACE/ADA: New Consensus Statement on Inpatient Glycemic Control (2015)  Target Ranges:  Prepandial:   less than 140 mg/dL      Peak postprandial:   less than 180 mg/dL (1-2 hours)      Critically ill patients:  140 - 180 mg/dL   Lab Results  Component Value Date   GLUCAP 83 09/19/2022   HGBA1C 6.6 (H) 08/01/2022    Review of Glycemic Control  Latest Reference Range & Units 09/18/22 07:59 09/18/22 11:34 09/18/22 16:16 09/18/22 21:14 09/19/22 07:20 09/19/22 08:20  Glucose-Capillary 70 - 99 mg/dL 161 (H) 096 (H) 045 (H) 220 (H) 51 (L) 83   Diabetes history: DM 2 Outpatient Diabetes medications: Tradjenta 5 mg Daily, Glipizide 10 mg bid Current orders for Inpatient glycemic control:  Tradjenta 5 mg Daily Glipizide 10 mg bid Novolog 0-20 units tid + hs scale  Hypoglycemia 51 this am.  Note: Decadron 10 mg given 2 days ago during surgery, glucose trends are coming back down to pt baseline  Inpatient Diabetes Program Recommendations:    -   Reduce Novolog Correction to 0-9 units tid + hs scale. -   Reduce Glipizide to 5 mg bid  Thanks,  Christena Deem RN, MSN, BC-ADM Inpatient Diabetes Coordinator Team Pager [phone] (8a-5p)

## 2022-09-19 NOTE — TOC Initial Note (Signed)
Transition of Care [facility]) - Initial/Assessment Note    Patient Details  Name: Shelly [name] MRN: [id number] Date of Birth: 01/01/1941  Transition of [facility]) CM/SW Contact:    Epifanio Lesches, RN Phone Number: 09/19/2022, 3:55 PM  Clinical Narrative:                    - s/p L KNEE RESECTION ARTHROPLASTY, REVISION TOTAL KNEE ARTHROPLASTY   From home alone. Hx of  bilateral TKA, T2DM, HTN, Asthma, hx of breast ca,  legally blind. PTA independent with ADL's. DME : RW, rollator and lift chair.   Supportive family. Children works, limited  assistance. Pt requiring LT IV ABX therapy. Daughter states pt will needs SNF/REHAB, FAMILY UNABLE TO ASSIST WITH CARE, NCM to make CSW aware.   TOC  team following and will assist with needs...  Expected Discharge Plan: Skilled Nursing Facility     Patient Goals and CMS Choice     Choice offered to / list presented to : Patient      Expected Discharge Plan and Services   Discharge Planning Services: CM Consult   Living arrangements for the past 2 months: Single Family Home                                      Prior Living Arrangements/Services Living arrangements for the past 2 months: Single Family Home     Do you feel safe going back to the place where you live?: Yes          Current home services: DME (rollator, RW, lift chair)    Activities of Daily Living Home Assistive Devices/Equipment: Environmental consultant (specify type), Cane (specify quad or straight) ADL Screening (condition at time of admission) Patient's cognitive ability adequate to safely complete daily activities?: Yes Is the patient deaf or have difficulty hearing?: No Does the patient have difficulty seeing, even when wearing glasses/contacts?: Yes Does the patient have difficulty concentrating, remembering, or making decisions?: Yes Patient able to express need for assistance with ADLs?: Yes Does the patient have difficulty dressing or bathing?:  Yes Independently performs ADLs?: Yes (appropriate for developmental age) Does the patient have difficulty walking or climbing stairs?: Yes Weakness of Legs: Both Weakness of Arms/Hands: Both  Permission Sought/Granted                  Emotional Assessment              Admission diagnosis:  Status post total left knee replacement [Z96.652] Status post revision of total replacement of left knee [Z96.652] Patient Active Problem List   Diagnosis Date Noted   Status post total left knee replacement 09/17/2022   Status post revision of total replacement of left knee 09/17/2022   Infection of prosthetic left knee joint (HCC) 09/16/2022   Chronic pain of right wrist 07/18/2022   Anxiety and depression 06/04/2022   Memory changes 06/04/2022   B12 deficiency 04/17/2022   CKD (chronic kidney disease) stage 3, GFR 30-59 ml/min (HCC) 04/16/2022   Legally blind 04/16/2022   Malignant neoplasm of upper-outer quadrant of left breast in female, estrogen receptor positive (HCC) 09/18/2017   Osteoarthritis of left knee 07/03/2012   Hyperlipidemia    Diabetes mellitus (HCC)    PUD (peptic ulcer disease)    Degenerative joint disease    Essential hypertension 12/07/2008   PCP:  Tommie Sams, DO Pharmacy:  [facility] - [place], [place] - [address]ure[address]s [address] [place] [address] Phone: [phone] Fax: [phone]  [facility] - [place], [place] - [address] [place] [address] Phone: [phone] Fax: [phone]     Social Determinants of Health (SDOH) Social History: SDOH Screenings   Food Insecurity: No Food Insecurity (09/18/2022)  Housing: Low Risk  (09/18/2022)  Transportation Needs: No Transportation Needs (09/18/2022)  Utilities: Not At Risk (09/18/2022)  Depression (PHQ2-9): Medium Risk (07/17/2022)  Tobacco Use: Medium Risk (09/17/2022)   SDOH Interventions:     Readmission Risk  Interventions     No data to display

## 2022-09-19 NOTE — Progress Notes (Signed)
Physical Therapy Treatment Patient Details Name: Shelly [name] MRN: [id number] DOB: 27-Jul-1940 Today's Date: 09/19/2022   History of Present Illness Patient is 82 y.o. female s/p Lt TKR to femoral, tibial, and patellar components on 09/17/22 due to prosthetic join infection. PMH significant for OA, asthma, breast cnacer, GERD, HTN, HLD, DM2, macular degeneration, Rt TKA in 2011, Lt TKA in 2014.    PT Comments  Patient resting in recliner at start of session, KI off and LE's elevated in recliner. Pt educated on donning of KI but requires Max assist due to visual deficits and weakness with Ue's/grip. Pt required cues at start for sit<>stand from recliner but was able to rise with CGA for safety. Cues for walker proximity at start of gait, pt demonstrated improved UE use to reduce pressure on Lt LE and CGA provided for safety throughout. No overt LOB noted but cautious. Continue to recommend follow up therapy, patient will benefit from continued inpatient follow up therapy, <3 hours/day. Will progress as able.   If plan is discharge home, recommend the following: A little help with walking and/or transfers;A little help with bathing/dressing/bathroom;Assistance with cooking/housework;Direct supervision/assist for medications management;Assist for transportation;Help with stairs or ramp for entrance   Can travel by private vehicle     Yes  Equipment Recommendations  None recommended by PT    Recommendations for Other Services       Precautions / Restrictions Precautions Precautions: Fall Required Braces or Orthoses: Knee Immobilizer - Left Knee Immobilizer - Left: On when out of bed or walking Restrictions Weight Bearing Restrictions: No     Mobility  Bed Mobility               General bed mobility comments: pt OOB in recliner    Transfers Overall transfer level: Needs assistance Equipment used: Rolling walker (2 wheels) Transfers: Sit to/from Stand Sit to Stand: Contact  guard assist           General transfer comment: pt required cues for safe technique/hand placement; pt able to power up from recliner with CGA.    Ambulation/Gait Ambulation/Gait assistance: Contact guard assist Gait Distance (Feet): 140 Feet Assistive device: Rolling walker (2 wheels) Gait Pattern/deviations: Step-through pattern, Decreased step length - right, Decreased step length - left, Decreased stride length, Shuffle, Trunk flexed Gait velocity: decr     General Gait Details: cues for proximity at start of session, pt steady throughout with CGA for safety. pt demonstrated improved use of UE's to decrease pressure on Lt LE in stance today.   Stairs             Wheelchair Mobility     Tilt Bed    Modified Rankin (Stroke Patients Only)       Balance Overall balance assessment: Needs assistance, History of Falls Sitting-balance support: Feet supported, Single extremity supported, No upper extremity supported Sitting balance-Leahy Scale: Fair Sitting balance - Comments: slight posterior lean   Standing balance support: Reliant on assistive device for balance, Bilateral upper extremity supported, During functional activity Standing balance-Leahy Scale: Poor                              Cognition Arousal: Alert Behavior During Therapy: WFL for tasks assessed/performed Overall Cognitive Status: Within Functional Limits for tasks assessed  General Comments: pt demonstrates improved safety awareness today, agreeable to ST rehab prior to return home        Exercises General Exercises - Lower Extremity Ankle Circles/Pumps: AROM, Both, 20 reps Quad Sets: AROM, Left, 10 reps Heel Slides: AROM, Left, 10 reps    General Comments        Pertinent Vitals/Pain Pain Assessment Pain Assessment: Faces Faces Pain Scale: Hurts a little bit Pain Location: Lt knee Pain Descriptors / Indicators:  Discomfort Pain Intervention(s): Limited activity within patient's tolerance, Monitored during session, Repositioned    Home Living                          Prior Function            PT Goals (current goals can now be found in the care plan section) Acute Rehab PT Goals Patient Stated Goal: return home PT Goal Formulation: With patient Time For Goal Achievement: 10/02/22 Potential to Achieve Goals: Good Progress towards PT goals: Progressing toward goals    Frequency    Min 1X/week      PT Plan      Co-evaluation              AM-PAC PT "6 Clicks" Mobility   Outcome Measure  Help needed turning from your back to your side while in a flat bed without using bedrails?: A Little Help needed moving from lying on your back to sitting on the side of a flat bed without using bedrails?: A Little Help needed moving to and from a bed to a chair (including a wheelchair)?: A Little Help needed standing up from a chair using your arms (e.g., wheelchair or bedside chair)?: A Little Help needed to walk in hospital room?: A Little Help needed climbing 3-5 steps with a railing? : A Lot 6 Click Score: 17    End of Session Equipment Utilized During Treatment: Gait belt Activity Tolerance: Patient tolerated treatment well Patient left: in chair;with call bell/phone within reach;with chair alarm set Nurse Communication: Mobility status PT Visit Diagnosis: Unsteadiness on feet (R26.81);Muscle weakness (generalized) (M62.81);Difficulty in walking, not elsewhere classified (R26.2);Pain Pain - Right/Left: Left Pain - part of body: Knee     Time: 0929-0958 PT Time Calculation (min) (ACUTE ONLY): 29 min  Charges:    $Gait Training: 8-22 mins $Therapeutic Exercise: 8-22 mins PT General Charges $$ ACUTE PT VISIT: 1 Visit                     Wynn Maudlin, DPT Acute Rehabilitation Services Office [phone]  09/19/22 12:55 PM

## 2022-09-20 ENCOUNTER — Other Ambulatory Visit: Payer: Self-pay | Admitting: Physician Assistant

## 2022-09-20 DIAGNOSIS — E162 Hypoglycemia, unspecified: Secondary | ICD-10-CM | POA: Diagnosis not present

## 2022-09-20 DIAGNOSIS — Z96652 Presence of left artificial knee joint: Secondary | ICD-10-CM | POA: Diagnosis not present

## 2022-09-20 DIAGNOSIS — A4901 Methicillin susceptible Staphylococcus aureus infection, unspecified site: Secondary | ICD-10-CM | POA: Diagnosis not present

## 2022-09-20 LAB — PREPARE RBC (CROSSMATCH)

## 2022-09-20 LAB — GLUCOSE, CAPILLARY
Glucose-Capillary: 48 mg/dL — ABNORMAL LOW (ref 70–99)
Glucose-Capillary: 116 mg/dL — ABNORMAL HIGH (ref 70–99)
Glucose-Capillary: 144 mg/dL — ABNORMAL HIGH (ref 70–99)
Glucose-Capillary: 121 mg/dL — ABNORMAL HIGH (ref 70–99)

## 2022-09-20 LAB — HEMOGLOBIN AND HEMATOCRIT, BLOOD
HCT: 28.5 % — ABNORMAL LOW (ref 36.0–46.0)
Hemoglobin: 8.3 g/dL — ABNORMAL LOW (ref 12.0–15.0)

## 2022-09-20 MED ORDER — INSULIN ASPART 100 UNIT/ML IJ SOLN: 0.0000 [IU] | Freq: Every day | INTRAMUSCULAR | Status: DC

## 2022-09-20 MED ORDER — SODIUM CHLORIDE 0.9% IV SOLUTION: Freq: Once | INTRAVENOUS | Status: DC

## 2022-09-20 MED ORDER — INSULIN ASPART 100 UNIT/ML IJ SOLN
0.0000 [IU] | Freq: Three times a day (TID) | INTRAMUSCULAR | Status: DC
  Administered 2022-09-20: 1 [IU] via SUBCUTANEOUS

## 2022-09-20 MED ORDER — DEXTROSE 50 % IV SOLN: 12.5000 g | INTRAVENOUS | Status: DC | PRN

## 2022-09-20 NOTE — TOC Progression Note (Addendum)
Transition of Care [facility]) - Progression Note    Patient Details  Name: Shelly [name] MRN: [id number] Date of Birth: 1940-06-24  [facility] CM/SW Contact  Lorri Frederick, LCSW Phone Number: 09/20/2022, 12:07 PM  Clinical Narrative:    CSW spoke with pt regarding SNF choice.  She would prefer SNF in [place] but will consider [facility] as well.  Permission given to send out referral in hub.  PASSR requires additional info.  1530: Bed offers provided to pt on medicare choice document.    Expected Discharge Plan: Skilled Nursing Facility    Expected Discharge Plan and Services   Discharge Planning Services: CM Consult   Living arrangements for the past 2 months: Single Family Home                                       Social Determinants of Health (SDOH) Interventions SDOH Screenings   Food Insecurity: No Food Insecurity (09/18/2022)  Housing: Low Risk  (09/18/2022)  Transportation Needs: No Transportation Needs (09/18/2022)  Utilities: Not At Risk (09/18/2022)  Depression (PHQ2-9): Medium Risk (07/17/2022)  Tobacco Use: Medium Risk (09/17/2022)    Readmission Risk Interventions     No data to display

## 2022-09-20 NOTE — Plan of Care (Signed)

## 2022-09-20 NOTE — Progress Notes (Signed)
[facility]    Date of Admission:  09/17/2022   Total days of antibiotics 4   ID: Shelly [name] is a 82 y.o. female with MSS left knee PJI Principal Problem:   Infection of prosthetic left knee joint (HCC) Active Problems:   Status post total left knee replacement   Status post revision of total replacement of left knee    Subjective: Afebrile but had another episode of symptomatic hypoglycemia this morning.  Upon review of medication - glipizide dosing is too high given her current kidney function  Medications:   sodium chloride   Intravenous Once   aspirin  81 mg Oral BID   carvedilol  3.125 mg Oral BID WC   Chlorhexidine Gluconate Cloth  6 each Topical Daily   docusate sodium  100 mg Oral BID   ferrous sulfate  325 mg Oral TID PC   insulin aspart  0-5 Units Subcutaneous QHS   insulin aspart  0-9 Units Subcutaneous TID WC   irbesartan  75 mg Oral Daily   sertraline  50 mg Oral Daily   sodium chloride flush  10-40 mL Intracatheter Q12H    Objective: Vital signs in last 24 hours: Temp:  [97.9 F (36.6 C)-98 F (36.7 C)] 98 F (36.7 C) (08/08 1448) Pulse Rate:  [79-84] 80 (08/08 1448) Resp:  [18] 18 (08/08 1448) BP: (129-140)/(65-101) 135/70 (08/08 1448) SpO2:  [93 %-99 %] 99 % (08/08 1448)  Physical Exam  Constitutional:  oriented to person, place, and time. appears well-developed and well-nourished. No distress.  HENT: [place]/AT, PERRLA, no scleral icterus Mouth/Throat: Oropharynx is clear and moist. No oropharyngeal exudate.  Cardiovascular: Normal rate, regular rhythm and normal heart sounds. Exam reveals no gallop and no friction rub.  No murmur heard.  Pulmonary/Chest: Effort normal and breath sounds normal. No respiratory distress.  has no wheezes.  Neck = supple, no nuchal rigidity Abdominal: Soft. Bowel sounds are normal.  exhibits no distension. There is no tenderness.  Lymphadenopathy: no cervical adenopathy. No axillary  adenopathy Neurological: alert and oriented to person, place, and time.  ZOX:WRUE knee wrapped. Drain/vac still in place Skin: Skin is warm and dry. No rash noted. No erythema.  Psychiatric: a normal mood and affect.  behavior is normal.    Lab Results Recent Labs    09/18/22 0057 09/19/22 0048 09/20/22 0315 09/20/22 1216  WBC 12.1* 13.6*  --   --   HGB 8.4* 7.5*  --  8.3*  HCT 29.6* 26.1*  --  28.5*  NA  --  131* 136  --   K  --  5.3* 5.1  --   CL  --  96* 106  --   CO2  --  23 24  --   BUN  --  45* 50*  --   CREATININE  --  1.53* 1.41*  --    Liver Panel No results for input(s): "PROT", "ALBUMIN", "AST", "ALT", "ALKPHOS", "BILITOT", "BILIDIR", "IBILI" in the last 72 hours. Sedimentation Rate Recent Labs    09/19/22 0048  ESRSEDRATE 32*   C-Reactive Protein Recent Labs    09/19/22 0048  CRP 8.3*    Microbiology: 8/5 OR cutlures still NGTD Studies/Results: [place] EKG SITE RITE  Result Date: 09/19/2022 If Site Rite image not attached, placement could not be confirmed due to current cardiac rhythm.    Assessment/Plan: MSSA left knee pji s/p single staged revision - continue on cefazolin. Plan for 6 wk of treatment  followed by oral suppression for additional 4.110months to complete 6 month course.  Hypoglycemia = her recent aki given fluid shifts from surgery. Has elevated cr above her baseline. Leads to excess accumulation of glipizide. Recommend to hold glipizide for at least 48hrs. Can restart on low dose of 2.5mg  daily. Would then defer to her PCP for management - will add use of glucagon as PRN - please place apple juice at bedside tonight to use in the morning. Since she did have a dose of glipizide this morning  Leukocytosis = will repeat cbc  Post operative anemia = receiving RBC  Tender right calf = will get U/S to rule out DVT  I have personally spent 50 minutes involved in face-to-face and non-face-to-face activities for this patient on the day of the  visit. Professional time spent includes the following activities: Preparing to see the patient (review of tests), Obtaining and/or reviewing separately obtained history (admission/discharge record), Performing a medically appropriate examination and/or evaluation , Ordering medications/tests/procedures, referring and communicating with other health care professionals, Documenting clinical information in the EMR, Independently interpreting results (not separately reported), Communicating results to the patient/family/caregiver, Counseling and educating the patient/family/caregiver and Care coordination (not separately reported).     [facility] Pager: [phone]  09/20/2022, 3:15 PM

## 2022-09-20 NOTE — Progress Notes (Signed)
Lab called later in day, pt hgb is 8.3 today. Pt does not meet transfusion requirements.  Pt has not been symptomatic today. Pt has been up in chair most of day, has walked to the bathroom with therapy and up to the [facility] several times today, asymptomatic.

## 2022-09-20 NOTE — Progress Notes (Signed)
Subjective: 3 Days Post-Op Procedure(s) (LRB): LEFT KNEE RESECTION ARTHROPLASTY, REVISION TOTAL KNEE ARTHROPLASTY (Left) Patient reports pain as mild.    Objective: Vital signs in last 24 hours: Temp:  [97.9 F (36.6 C)-98.3 F (36.8 C)] 98 F (36.7 C) (08/08 0335) Pulse Rate:  [84-85] 84 (08/08 0335) Resp:  [17-18] 18 (08/08 0335) BP: (101-131)/(65-101) 131/65 (08/08 0335) SpO2:  [93 %-97 %] 97 % (08/08 0335)  Intake/Output from previous day: 08/07 0701 - 08/08 0700 In: 610 [P.O.:600; I.V.:10] Out: 100 [Drains:100] Intake/Output this shift: No intake/output data recorded.  Recent Labs    09/17/22 1430 09/18/22 0057 09/19/22 0048  HGB 9.7* 8.4* 7.5*   Recent Labs    09/18/22 0057 09/19/22 0048  WBC 12.1* 13.6*  RBC 3.96 3.54*  HCT 29.6* 26.1*  PLT 434* 435*   Recent Labs    09/19/22 0048 09/20/22 0315  NA 131* 136  K 5.3* 5.1  CL 96* 106  CO2 23 24  BUN 45* 50*  CREATININE 1.53* 1.41*  GLUCOSE 132* 54*  CALCIUM 8.9 8.2*   No results for input(s): "LABPT", "INR" in the last 72 hours.  Neurologically intact Neurovascular intact Sensation intact distally Intact pulses distally Dorsiflexion/Plantar flexion intact Incision: dressing C/D/I No cellulitis present Compartment soft Ivac in place with good seal.  100 mL blood in canister   Assessment/Plan: 3 Days Post-Op Procedure(s) (LRB): LEFT KNEE RESECTION ARTHROPLASTY, REVISION TOTAL KNEE ARTHROPLASTY (Left) Advance diet Up with therapy Discharge to SNF once blood transfusion complete, insurance approves and bed is available WBAT LLE.  Must wear knee immobilizer when bearing weight Please swap out ivac hospital unit with portable prevena day of discharge, not sooner.  ABLA- will transfuse with 2 units prbc today Intra-op cx ngtd- picc line placed yesterday.  ID switched to IV cefazolin       Cristie Hem 09/20/2022, 8:09 AM

## 2022-09-20 NOTE — Progress Notes (Signed)
Pt has orders for blood transfusion for 2 units of PRBCs.  Lab reached out about pt hgb at 7.5 yesterday, needed to speak with MD or PA about protocol for blood transfusions for hgb over 7.0.  Lab going to reach out to [facility] PA to discuss pt's case and request H & H to be repeated for today, pt is O negative blood difficult to get per lab and needs to meet qualifying factors to receive transfusion with hgb over 7.0.

## 2022-09-20 NOTE — Progress Notes (Signed)
RE:  Shelly [name]      Date of Birth:   [birth year]    Date:   09/20/22       To Whom It May Concern:  Please be advised that the above-named patient will require a short-term nursing home stay - anticipated 30 days or less for rehabilitation and strengthening.  The plan is for return home.                 MD signature                Date

## 2022-09-20 NOTE — Progress Notes (Signed)
Occupational Therapy Treatment Patient Details Name: Shelly [name] MRN: [id number] DOB: 06-22-1940 Today's Date: 09/20/2022   History of present illness Patient is 82 y.o. female s/p Lt TKR to femoral, tibial, and patellar components on 09/17/22 due to prosthetic join infection. PMH significant for OA, asthma, breast cnacer, GERD, HTN, HLD, DM2, macular degeneration, Rt TKA in 2011, Lt TKA in 2014.   OT comments  Pt doing well, motivated to participate and improve OOB activities. Pt requires mod A to don L knee immobilizer, able to verbalize precautions and use of LLE immo. Pt min A for STS today, good overall strength/endurance for activities, able to ambulate to toilet using RW, contact guard. Pt able to perform toileting hygiene mod I. Pt displays fair standing balance at sink washing hands, grooming, although legally blind did not need assistance finding soap/towels or ambulating around room. Pt improving well, would benefit from continued skilled therapy to maximize progress, Pt plans to DC to SNF at this time.       If plan is discharge home, recommend the following:  A little help with walking and/or transfers;A lot of help with bathing/dressing/bathroom;Assistance with cooking/housework;Help with stairs or ramp for entrance;Assist for transportation   Equipment Recommendations  BSC/3in1    Recommendations for Other Services      Precautions / Restrictions Precautions Precautions: Fall Required Braces or Orthoses: Knee Immobilizer - Left Knee Immobilizer - Left: On when out of bed or walking Restrictions Weight Bearing Restrictions: No       Mobility Bed Mobility Overal bed mobility: Needs Assistance             General bed mobility comments: arrived in chair    Transfers Overall transfer level: Needs assistance Equipment used: Rolling walker (2 wheels) Transfers: Sit to/from Stand, Bed to chair/wheelchair/BSC Sit to Stand: Min assist     Step pivot transfers:  Contact guard assist     General transfer comment: RW for support, min A for power to STS     Balance Overall balance assessment: Needs assistance, History of Falls Sitting-balance support: Feet supported, Single extremity supported, No upper extremity supported Sitting balance-Leahy Scale: Good Sitting balance - Comments: reclined ADLs, able to scoot back in chair and lean L/R   Standing balance support: During functional activity, Bilateral upper extremity supported Standing balance-Leahy Scale: Fair Standing balance comment: able to stand at sink and reach for soap/hand towels, RW for support, able to lean on sink to wash hands while standing as needed.                           ADL either performed or assessed with clinical judgement   ADL Overall ADL's : Needs assistance/impaired Eating/Feeding: Independent   Grooming: Contact guard assist;Standing           Upper Body Dressing : Set up;Sitting   Lower Body Dressing: Minimal assistance;Sit to/from stand   Toilet Transfer: Minimal assistance;Ambulation;Rolling walker (2 wheels)   Toileting- Clothing Manipulation and Hygiene: Modified independent;Sitting/lateral lean       Functional mobility during ADLs: Contact guard assist;Rolling walker (2 wheels) General ADL Comments: Pt able to ambulate with RW to toilet, min A for STS, min guard for mobility, able to manage clothing/hygiene mod I. Pt able to stand at sink completing grooming/hand hygiene contact guard.    Extremity/Trunk Assessment              Vision       Perception  Praxis      Cognition Arousal: Alert Behavior During Therapy: WFL for tasks assessed/performed Overall Cognitive Status: Within Functional Limits for tasks assessed                                          Exercises      Shoulder Instructions       General Comments      Pertinent Vitals/ Pain       Pain Assessment Pain Assessment:  Faces Faces Pain Scale: Hurts a little bit Pain Descriptors / Indicators: Discomfort Pain Intervention(s): Monitored during session  Home Living                                          Prior Functioning/Environment              Frequency  Min 1X/week        Progress Toward Goals  OT Goals(current goals can now be found in the care plan section)  Progress towards OT goals: Progressing toward goals  Acute Rehab OT Goals Patient Stated Goal: to increase OOB activities OT Goal Formulation: With patient Time For Goal Achievement: 10/02/22 Potential to Achieve Goals: Good ADL Goals Pt Will Perform Grooming: with modified independence;standing Pt Will Perform Lower Body Dressing: with set-up;with adaptive equipment;sit to/from stand;sitting/lateral leans Pt Will Transfer to Toilet: with modified independence;ambulating;bedside commode;regular height toilet Pt Will Perform Toileting - Clothing Manipulation and hygiene: with modified independence;sit to/from stand;sitting/lateral leans Pt/caregiver will Perform Home Exercise Program: Increased strength;Both right and left upper extremity;With theraband;Independently;With written HEP provided  Plan      Co-evaluation                 AM-PAC OT "6 Clicks" Daily Activity     Outcome Measure   Help from another person eating meals?: None Help from another person taking care of personal grooming?: A Little Help from another person toileting, which includes using toliet, bedpan, or urinal?: A Little Help from another person bathing (including washing, rinsing, drying)?: A Little Help from another person to put on and taking off regular upper body clothing?: A Little Help from another person to put on and taking off regular lower body clothing?: A Little 6 Click Score: 19    End of Session Equipment Utilized During Treatment: Gait belt;Rolling walker (2 wheels)  OT Visit Diagnosis: Unsteadiness on feet  (R26.81);Muscle weakness (generalized) (M62.81);History of falling (Z91.81)   Activity Tolerance Patient tolerated treatment well   Patient Left in chair;with call bell/phone within reach;with nursing/sitter in room   Nurse Communication Mobility status        Time: 6440-3474 OT Time Calculation (min): 25 min  Charges: OT General Charges $OT Visit: 1 Visit OT Treatments $Self Care/Home Management : 23-37 mins  [address], OTR/L    R  09/20/2022, 1:12 PM

## 2022-09-20 NOTE — NC FL2 (Signed)
[place] MEDICAID FL2 LEVEL OF CARE FORM     IDENTIFICATION  Patient Name: Shelly [name] Birthdate: April 25, 1940 Sex: female Admission Date (Current Location): 09/17/2022  [facility] and [place] Number:  Producer, television/film/video and Address:  The . [facility], [address], [place], [place] [address]      Provider Number: [id number]  Attending Physician Name and Address:  Tarry Kos, MD  Relative Name and Phone Number:  Sterling Big Daughter  [phone]    Current Level of Care: Hospital Recommended Level of Care: Skilled Nursing Facility Prior Approval Number:    Date Approved/Denied:   PASRR Number:    Discharge Plan: SNF    Current Diagnoses: Patient Active Problem List   Diagnosis Date Noted   Status post total left knee replacement 09/17/2022   Status post revision of total replacement of left knee 09/17/2022   Infection of prosthetic left knee joint (HCC) 09/16/2022   Chronic pain of right wrist 07/18/2022   Anxiety and depression 06/04/2022   Memory changes 06/04/2022   B12 deficiency 04/17/2022   CKD (chronic kidney disease) stage 3, GFR 30-59 ml/min (HCC) 04/16/2022   Legally blind 04/16/2022   Malignant neoplasm of upper-outer quadrant of left breast in female, estrogen receptor positive (HCC) 09/18/2017   Osteoarthritis of left knee 07/03/2012   Hyperlipidemia    Diabetes mellitus (HCC)    PUD (peptic ulcer disease)    Degenerative joint disease    Essential hypertension 12/07/2008    Orientation RESPIRATION BLADDER Height & Weight     Self, Time, Situation, Place  Normal Continent Weight: 202 lb (91.6 kg) Height:  5\' 3"  (160 cm)  BEHAVIORAL SYMPTOMS/MOOD NEUROLOGICAL BOWEL NUTRITION STATUS      Continent Diet (see discharge summary)  AMBULATORY STATUS COMMUNICATION OF NEEDS Skin   Limited Assist Verbally Surgical wounds                       Personal Care Assistance Level of Assistance  Bathing, Feeding,  Dressing Bathing Assistance: Limited assistance Feeding assistance: Independent Dressing Assistance: Limited assistance     Functional Limitations Info  Sight, Hearing, Speech Sight Info: Impaired Hearing Info: Adequate Speech Info: Adequate    SPECIAL CARE FACTORS FREQUENCY  PT (By licensed PT), OT (By licensed OT)     PT Frequency: 5x week OT Frequency: 5x week            Contractures Contractures Info: Not present    Additional Factors Info  Code Status, Allergies, Insulin Sliding Scale Code Status Info: full Allergies Info: Celebrex (Celecoxib), Claritin (Loratadine), Penicillins, Percocet (Oxycodone-acetaminophen)   Insulin Sliding Scale Info: Novolog: see discharge summary       Current Medications (09/20/2022):  This is the current hospital active medication list Current Facility-Administered Medications  Medication Dose Route Frequency Provider Last Rate Last Admin   0.9 %  sodium chloride infusion (Manually program via Guardrails IV Fluids)   Intravenous Once Jari Sportsman L, PA-C       0.9 %  sodium chloride infusion   Intravenous Continuous Tarry Kos, MD   Stopped at 09/17/22 2308   acetaminophen (TYLENOL) tablet 1,000 mg  1,000 mg Oral Q6H PRN Tarry Kos, MD   1,000 mg at 09/20/22 1159   acetaminophen (TYLENOL) tablet 325-650 mg  325-650 mg Oral Q6H PRN Tarry Kos, MD       aspirin chewable tablet 81 mg  81 mg Oral BID Roda Shutters,  Edwin Cap, MD   81 mg at 09/19/22 2032   carvedilol (COREG) tablet 3.125 mg  3.125 mg Oral BID WC Tarry Kos, MD   3.125 mg at 09/19/22 1847   ceFAZolin (ANCEF) IVPB 2g/100 mL premix  2 g Intravenous Joanette Gula, MD 200 mL/hr at 09/20/22 0109 2 g at 09/20/22 0109   Chlorhexidine Gluconate Cloth 2 % PADS 6 each  6 each Topical Daily Tarry Kos, MD   6 each at 09/19/22 1930   dextrose 50 % solution 12.5 g  12.5 g Intravenous PRN Judyann Munson, MD       docusate sodium (COLACE) capsule 100 mg  100 mg Oral BID Tarry Kos, MD   100 mg at 09/20/22 1158   ferrous sulfate tablet 325 mg  325 mg Oral TID PC Tarry Kos, MD   325 mg at 09/20/22 1159   HYDROcodone-acetaminophen (NORCO) 7.5-325 MG per tablet 1-2 tablet  1-2 tablet Oral Q4H PRN Tarry Kos, MD   2 tablet at 09/18/22 0522   HYDROcodone-acetaminophen (NORCO/VICODIN) 5-325 MG per tablet 1-2 tablet  1-2 tablet Oral Q4H PRN Tarry Kos, MD   2 tablet at 09/18/22 2040   insulin aspart (novoLOG) injection 0-5 Units  0-5 Units Subcutaneous QHS Magnant, Charles L, PA-C       insulin aspart (novoLOG) injection 0-9 Units  0-9 Units Subcutaneous TID WC Magnant, Charles L, PA-C       irbesartan (AVAPRO) tablet 75 mg  75 mg Oral Daily Tarry Kos, MD   75 mg at 09/20/22 1157   menthol-cetylpyridinium (CEPACOL) lozenge 3 mg  1 lozenge Oral PRN Tarry Kos, MD       Or   phenol (CHLORASEPTIC) mouth spray 1 spray  1 spray Mouth/Throat PRN Tarry Kos, MD       methocarbamol (ROBAXIN) tablet 500 mg  500 mg Oral Q6H PRN Tarry Kos, MD   500 mg at 09/18/22 1637   Or   methocarbamol (ROBAXIN) 500 mg in dextrose 5 % 50 mL IVPB  500 mg Intravenous Q6H PRN Tarry Kos, MD       metoCLOPramide (REGLAN) tablet 5-10 mg  5-10 mg Oral Q8H PRN Tarry Kos, MD       Or   metoCLOPramide (REGLAN) injection 5-10 mg  5-10 mg Intravenous Q8H PRN Tarry Kos, MD       morphine (PF) 2 MG/ML injection 0.5-1 mg  0.5-1 mg Intravenous Q2H PRN Tarry Kos, MD   1 mg at 09/17/22 2317   ondansetron (ZOFRAN) tablet 4 mg  4 mg Oral Q6H PRN Tarry Kos, MD       Or   ondansetron [facility]) injection 4 mg  4 mg Intravenous Q6H PRN Tarry Kos, MD       sertraline (ZOLOFT) tablet 50 mg  50 mg Oral Daily Tarry Kos, MD   50 mg at 09/20/22 1158   sodium chloride flush (NS) 0.9 % injection 10-40 mL  10-40 mL Intracatheter Q12H Tarry Kos, MD   10 mL at 09/19/22 2046   sodium chloride flush (NS) 0.9 % injection 10-40 mL  10-40 mL Intracatheter PRN Tarry Kos, MD          Discharge Medications: Please see discharge summary for a list of discharge medications.  Relevant Imaging Results:  Relevant Lab Results:   Additional Information SSN: [id number].  Pt will require IV abx:  Ancef, 2 grams q8 hours x6 weeks.  PT recommendations were for [facility], but will need PT as well.  Lorri Frederick, LCSW

## 2022-09-20 NOTE — Inpatient Diabetes Management (Signed)
Inpatient Diabetes Program Recommendations  AACE/ADA: New Consensus Statement on Inpatient Glycemic Control (2015)  Target Ranges:  Prepandial:   less than 140 mg/dL      Peak postprandial:   less than 180 mg/dL (1-2 hours)      Critically ill patients:  140 - 180 mg/dL   Lab Results  Component Value Date   GLUCAP 48 (L) 09/20/2022   HGBA1C 6.6 (H) 08/01/2022    Latest Reference Range & Units 09/19/22 08:20 09/19/22 12:01 09/19/22 12:26 09/19/22 16:01 09/19/22 19:36 09/20/22 07:47  Glucose-Capillary 70 - 99 mg/dL 83 39 (LL) 71 161 (H) 096 (H) 48 (L)  (LL): Data is critically low (H): Data is abnormally high (L): Data is abnormally low Review of Glycemic Control  Diabetes history: type 2 Outpatient Diabetes medications: Tradjenta 5 mg daily, Glipizide 10 mg BID Current orders for Inpatient glycemic control: Tradjenta 5 mg daily, Glipizide 10 mg BID, Novolog 0-20 units correction scale TID, Novolog 0-5 units HS scale  Inpatient Diabetes Program Recommendations:   Noted that patient continues to have low blood sugars less than 70 mg/dl.  Recommend decreasing Novolog correction scale to 0-9 units scale Discontinue Glipizide while in the hospital if blood sugars continue to be low.   Smith Mince RN BSN CDE Diabetes Coordinator Pager: [phone]  8am-5pm

## 2022-09-21 ENCOUNTER — Inpatient Hospital Stay (HOSPITAL_COMMUNITY): Payer: PPO

## 2022-09-21 DIAGNOSIS — M79661 Pain in right lower leg: Secondary | ICD-10-CM | POA: Diagnosis not present

## 2022-09-21 DIAGNOSIS — A4901 Methicillin susceptible Staphylococcus aureus infection, unspecified site: Secondary | ICD-10-CM | POA: Diagnosis not present

## 2022-09-21 DIAGNOSIS — T8450XS Infection and inflammatory reaction due to unspecified internal joint prosthesis, sequela: Secondary | ICD-10-CM | POA: Diagnosis not present

## 2022-09-21 DIAGNOSIS — E162 Hypoglycemia, unspecified: Secondary | ICD-10-CM | POA: Diagnosis not present

## 2022-09-21 DIAGNOSIS — Z96652 Presence of left artificial knee joint: Secondary | ICD-10-CM | POA: Diagnosis not present

## 2022-09-21 LAB — BASIC METABOLIC PANEL
Anion gap: 6 (ref 5–15)
BUN: 34 mg/dL — ABNORMAL HIGH (ref 8–23)
CO2: 25 mmol/L (ref 22–32)
Calcium: 8.2 mg/dL — ABNORMAL LOW (ref 8.9–10.3)
Chloride: 104 mmol/L (ref 98–111)
Creatinine, Ser: 1.25 mg/dL — ABNORMAL HIGH (ref 0.44–1.00)
GFR, Estimated: 43 mL/min — ABNORMAL LOW (ref >60)
Glucose, Bld: 178 mg/dL — ABNORMAL HIGH (ref 70–99)
Potassium: 4.9 mmol/L (ref 3.5–5.1)
Sodium: 135 mmol/L (ref 135–145)

## 2022-09-21 LAB — CBC WITH DIFFERENTIAL/PLATELET
Abs Immature Granulocytes: 0.02 10*3/uL (ref 0.00–0.07)
Basophils Absolute: 0.1 10*3/uL (ref 0.0–0.1)
Basophils Relative: 1 %
Eosinophils Absolute: 0.2 10*3/uL (ref 0.0–0.5)
Eosinophils Relative: 3 %
HCT: 27 % — ABNORMAL LOW (ref 36.0–46.0)
Hemoglobin: 7.9 g/dL — ABNORMAL LOW (ref 12.0–15.0)
Immature Granulocytes: 0 %
Lymphocytes Relative: 17 %
Lymphs Abs: 1.2 10*3/uL (ref 0.7–4.0)
MCH: 21.9 pg — ABNORMAL LOW (ref 26.0–34.0)
MCHC: 29.3 g/dL — ABNORMAL LOW (ref 30.0–36.0)
MCV: 74.8 fL — ABNORMAL LOW (ref 80.0–100.0)
Monocytes Absolute: 0.5 10*3/uL (ref 0.1–1.0)
Monocytes Relative: 7 %
Neutro Abs: 4.8 10*3/uL (ref 1.7–7.7)
Neutrophils Relative %: 72 %
Platelets: 429 10*3/uL — ABNORMAL HIGH (ref 150–400)
RBC: 3.61 MIL/uL — ABNORMAL LOW (ref 3.87–5.11)
RDW: 18.1 % — ABNORMAL HIGH (ref 11.5–15.5)
WBC: 6.7 10*3/uL (ref 4.0–10.5)
nRBC: 0 % (ref 0.0–0.2)

## 2022-09-21 LAB — GLUCOSE, CAPILLARY
Glucose-Capillary: 136 mg/dL — ABNORMAL HIGH (ref 70–99)
Glucose-Capillary: 180 mg/dL — ABNORMAL HIGH (ref 70–99)
Glucose-Capillary: 186 mg/dL — ABNORMAL HIGH (ref 70–99)
Glucose-Capillary: 153 mg/dL — ABNORMAL HIGH (ref 70–99)

## 2022-09-21 NOTE — Progress Notes (Signed)
Right lower extremity venous study completed.   Preliminary results relayed to RN.  Please see CV Procedures for preliminary results.   , RVT  9:52 AM 09/21/22

## 2022-09-21 NOTE — Discharge Summary (Cosign Needed)
Patient ID: Shelly [name] MRN: [id number] DOB/AGE: October 22, 1940 82 y.o.  Admit date: 09/17/2022 Discharge date: 09/21/2022  Admission Diagnoses:  Principal Problem:   Infection of prosthetic left knee joint (HCC) Active Problems:   Status post total left knee replacement   Status post revision of total replacement of left knee   Discharge Diagnoses:  Same  Past Medical History:  Diagnosis Date   Arthritis    "qwhere" (07/01/2012)   Asthma    normal chest x-ray in 2010   Basal cell carcinoma of face    Breast cancer (HCC)    Cholelithiasis    Chronic lower back pain    Degenerative joint disease    Right TKA-2010; low back pain; hands and hips also affected   Exertional shortness of breath    "mostly from being too fat" (07/01/2012)   GERD (gastroesophageal reflux disease)    history   H/O hiatal hernia    Heart murmur    small   Hemorrhoid    HTN (hypertension)    Hyperlipidemia    Macular degeneration    "both eyes" (07/01/2012)   PUD (peptic ulcer disease) 2003   Upper GI bleed-gastric ulcer; gastroesophageal reflux disease   Seasonal allergies    Type II diabetes mellitus (HCC)     Surgeries: Procedure(s): LEFT KNEE RESECTION ARTHROPLASTY, REVISION TOTAL KNEE ARTHROPLASTY on 09/17/2022   Consultants:   Discharged Condition: Improved  Hospital Course: Shelly [name] is an 82 y.o. female who was admitted 09/17/2022 for operative treatment ofInfection of prosthetic left knee joint (HCC). Patient has severe unremitting pain that affects sleep, daily activities, and work/hobbies. After pre-op clearance the patient was taken to the operating room on 09/17/2022 and underwent  Procedure(s): LEFT KNEE RESECTION ARTHROPLASTY, REVISION TOTAL KNEE ARTHROPLASTY.    Patient was given perioperative antibiotics:  Anti-infectives (From admission, onward)    Start     Dose/Rate Route Frequency Ordered Stop   09/19/22 1015  vancomycin (VANCOREADY) IVPB 750 mg/150 mL  Status:   Discontinued       Placed in "Followed by" Linked Group   750 mg 150 mL/hr over 60 Minutes Intravenous Every 24 hours 09/18/22 0929 09/18/22 1658   09/19/22 0000  ceFAZolin (ANCEF) IVPB        2 g Intravenous Every 8 hours 09/19/22 1444 10/29/22 2359   09/18/22 1800  ceFAZolin (ANCEF) IVPB 2g/100 mL premix        2 g 200 mL/hr over 30 Minutes Intravenous Every 8 hours 09/18/22 1659     09/18/22 0930  vancomycin (VANCOREADY) IVPB 2000 mg/400 mL       Placed in "Followed by" Linked Group   2,000 mg 200 mL/hr over 120 Minutes Intravenous  Once 09/18/22 0929 09/18/22 1329   09/18/22 0800  ceFEPIme (MAXIPIME) 2 g in sodium chloride 0.9 % 100 mL IVPB  Status:  Discontinued        2 g 200 mL/hr over 30 Minutes Intravenous Every 12 hours 09/18/22 0635 09/18/22 1658   09/18/22 0800  vancomycin (VANCOREADY) IVPB 750 mg/150 mL  Status:  Discontinued        750 mg 150 mL/hr over 60 Minutes Intravenous Every 12 hours 09/18/22 0635 09/18/22 0929   09/17/22 2300  ceFAZolin (ANCEF) IVPB 2g/100 mL premix        2 g 200 mL/hr over 30 Minutes Intravenous Every 6 hours 09/17/22 2152 09/18/22 0553   09/17/22 1727  tobramycin (NEBCIN) powder  Status:  Discontinued  As needed 09/17/22 1727 09/17/22 2000   09/17/22 1715  vancomycin (VANCOCIN) powder  Status:  Discontinued          As needed 09/17/22 1716 09/17/22 2000   09/17/22 1400  ceFAZolin (ANCEF) IVPB 2g/100 mL premix        2 g 200 mL/hr over 30 Minutes Intravenous On call to O.R. 09/17/22 1351 09/17/22 1638   09/17/22 1353  ceFAZolin (ANCEF) 2-4 GM/100ML-% IVPB       Note to Pharmacy: Hurshel Keys C: cabinet override      09/17/22 1353 09/17/22 1639        Patient was given sequential compression devices, early ambulation, and chemoprophylaxis to prevent DVT.  Patient benefited maximally from hospital stay and there were no complications.    Recent vital signs: Patient Vitals for the past 24 hrs:  BP Temp Temp src Pulse Resp SpO2   09/21/22 0744 (!) 144/59 98 F (36.7 C) Oral 93 -- 95 %  09/21/22 0425 135/67 98.4 F (36.9 C) Oral 87 -- 96 %  09/20/22 2040 138/68 -- -- 81 18 95 %  09/20/22 1448 135/70 98 F (36.7 C) Oral 80 18 99 %     Recent laboratory studies:  Recent Labs    09/19/22 0048 09/20/22 0315 09/20/22 1216  WBC 13.6*  --   --   HGB 7.5*  --  8.3*  HCT 26.1*  --  28.5*  PLT 435*  --   --   NA 131* 136  --   K 5.3* 5.1  --   CL 96* 106  --   CO2 23 24  --   BUN 45* 50*  --   CREATININE 1.53* 1.41*  --   GLUCOSE 132* 54*  --   CALCIUM 8.9 8.2*  --      Discharge Medications:   Allergies as of 09/21/2022       Reactions   Celebrex [celecoxib] Other (See Comments)   Abdominal pain; history of upper GI bleed   Claritin [loratadine] Hives, Swelling   blisters   Penicillins Hives   Percocet [oxycodone-acetaminophen] Other (See Comments)   Severe constipation        Medication List     STOP taking these medications    acetaminophen 650 MG CR tablet Commonly known as: TYLENOL   traMADol 50 MG tablet Commonly known as: ULTRAM       TAKE these medications    albuterol 108 (90 Base) MCG/ACT inhaler Commonly known as: VENTOLIN HFA Inhale 2 puffs into the lungs every 6 (six) hours as needed for shortness of breath.   aspirin 81 MG chewable tablet Chew 1 tablet (81 mg total) by mouth 2 (two) times daily. To be taken after surgery to prevent blood clots   B-12 Compliance Injection 1000 MCG/ML Kit Generic drug: Cyanocobalamin Inject 1 mL as directed every 30 (thirty) days.   carvedilol 3.125 MG tablet Commonly known as: COREG Take 3.125 mg by mouth 2 (two) times daily. Dose reduction per patient   ceFAZolin IVPB Commonly known as: ANCEF Inject 2 g into the vein every 8 (eight) hours. Indication:  MSSA L-knee PJI First Dose: Yes Last Day of Therapy:  10/29/22 Labs - Once weekly:  CBC/D and BMP, Labs - Once weekly: ESR and CRP Method of administration: IV Push of IVPB  per SNF protocol Pull PICC line at the completion of IV antibiotic therapy Method of administration may be changed at the discretion of home infusion pharmacist  based upon assessment of the patient and/or caregiver's ability to self-administer the medication ordered.   cetirizine 10 MG tablet Commonly known as: ZYRTEC Take 10 mg by mouth daily as needed for allergies.   cholecalciferol 25 MCG (1000 UNIT) tablet Commonly known as: VITAMIN D3 Take 1,000 Units by mouth daily.   cyclobenzaprine 5 MG tablet Commonly known as: FLEXERIL Take 5 mg by mouth 3 (three) times daily as needed for muscle spasms.   doxycycline 100 MG tablet Commonly known as: VIBRA-TABS Take 1 tablet (100 mg total) by mouth 2 (two) times daily.   furosemide 20 MG tablet Commonly known as: Lasix Take 1 tablet (20 mg total) by mouth daily as needed for edema. What changed:  when to take this additional instructions   glipiZIDE 10 MG 24 hr tablet Commonly known as: GLUCOTROL XL Take 10 mg by mouth 2 (two) times daily.   HYDROcodone-acetaminophen 5-325 MG tablet Commonly known as: NORCO/VICODIN Take 1-2 tablets by mouth every 6 (six) hours as needed for moderate pain (pain score 4-6).   lidocaine 5 % ointment Commonly known as: XYLOCAINE Apply 1 Application topically 3 (three) times daily as needed.   linagliptin 5 MG Tabs tablet Commonly known as: TRADJENTA Take 1 tablet (5 mg total) by mouth daily.   pravastatin 10 MG tablet Commonly known as: PRAVACHOL Take 10 mg by mouth every evening.   PRESERVISION AREDS PO Take 1 tablet by mouth 2 (two) times daily.   sertraline 50 MG tablet Commonly known as: ZOLOFT Take 1 tablet (50 mg total) by mouth daily.   Turmeric 450 MG Caps Take 450 mg by mouth daily.   valsartan 80 MG tablet Commonly known as: DIOVAN Take 1 tablet (80 mg total) by mouth daily.               Home Infusion Instuctions  (From admission, onward)           Start      Ordered   09/19/22 0000  Home infusion instructions       Question:  Instructions  Answer:  Flushing of vascular access device: 0.9% NaCl pre/post medication administration and prn patency; Heparin 100 u/ml, 5ml for implanted ports and Heparin 10u/ml, 5ml for all other central venous catheters.   09/19/22 1444              Durable Medical Equipment  (From admission, onward)           Start     Ordered   09/17/22 2153  DME Walker rolling  Once       Question Answer Comment  Walker: With 5 Inch Wheels   Patient needs a walker to treat with the following condition Status post left partial knee replacement      09/17/22 2152   09/17/22 2153  DME 3 n 1  Once        09/17/22 2152   09/17/22 2153  DME Bedside commode  Once       Question:  Patient needs a bedside commode to treat with the following condition  Answer:  Status post left partial knee replacement   09/17/22 2152            Diagnostic Studies: [place] EKG SITE RITE  Result Date: 09/19/2022 If Site Rite image not attached, placement could not be confirmed due to current cardiac rhythm.  DG Knee Left Port  Result Date: 09/17/2022 CLINICAL DATA:  Postop in PACU. EXAM: PORTABLE LEFT KNEE - 1-2 VIEW  COMPARISON:  Radiograph 09/12/2022 FINDINGS: The previous patellar arthroplasty component has been removed. The tibial component has been removed with underlying cement. The femoral component may be new. No fracture. Recent postsurgical change includes air and edema in the soft tissues and joint space. Presumed wound VAC in place. IMPRESSION: Postsurgical change of the left knee. No immediate postoperative complication. Electronically Signed   By: Narda Rutherford M.D.   On: 09/17/2022 21:36   XR Knee 1-2 Views Left  Result Date: 09/12/2022 X-rays demonstrate a left total knee replacement.  There is a lucency below the tibial baseplate.   Disposition: Discharge disposition: 03-Skilled Nursing Facility       Discharge  Instructions     Home infusion instructions   Complete by: As directed    Instructions: Flushing of vascular access device: 0.9% NaCl pre/post medication administration and prn patency; Heparin 100 u/ml, 5ml for implanted ports and Heparin 10u/ml, 5ml for all other central venous catheters.        Follow-up Information     Cristie Hem, PA-C Follow up in 1 week(s).   Specialty: Orthopedic Surgery Contact information: [address] [place] [address] [phone]                  Signed: Cristie Hem 09/21/2022, 9:58 AM

## 2022-09-21 NOTE — TOC Progression Note (Addendum)
Transition of Care [facility]) - Progression Note    Patient Details  Name: TSUYAKO SCHNACKENBERG MRN: [id number] Date of Birth: [birth year]  Transition of Care [facility]) CM/SW Contact  Lorri Frederick, LCSW Phone Number: 09/21/2022, 11:57 AM  Clinical Narrative:   CSW with pt and with daughter Milagros Loll on speakerphone.  Update the bed offers, they would like response from [facility].  CSW reached out to that facility.  Auth request made with HTA, facility choice pending.    1310: [facility] makes bed offer, cannot accept pt until Monday. Daughter informed.  HTA updated with facility choice.   PASSR received: [id number]    Expected Discharge Plan: Skilled Nursing Facility    Expected Discharge Plan and Services   Discharge Planning Services: CM Consult   Living arrangements for the past 2 months: Single Family Home Expected Discharge Date: 09/21/22                                     Social Determinants of Health (SDOH) Interventions SDOH Screenings   Food Insecurity: No Food Insecurity (09/18/2022)  Housing: Low Risk  (09/18/2022)  Transportation Needs: No Transportation Needs (09/18/2022)  Utilities: Not At Risk (09/18/2022)  Depression (PHQ2-9): Medium Risk (07/17/2022)  Tobacco Use: Medium Risk (09/17/2022)    Readmission Risk Interventions     No data to display

## 2022-09-21 NOTE — Progress Notes (Addendum)
[facility]    Date of Admission:  09/17/2022   Total days of antibiotics 5           ID: Shelly [name] is a 82 y.o. female with  MSS left knee PJI Principal Problem:   Infection of prosthetic left knee joint (HCC) Active Problems:   Status post total left knee replacement   Status post revision of total replacement of left knee    Subjective: Afebrile. Did not have hypoglycemia this morning  Medications:   sodium chloride   Intravenous Once   aspirin  81 mg Oral BID   carvedilol  3.125 mg Oral BID WC   Chlorhexidine Gluconate Cloth  6 each Topical Daily   docusate sodium  100 mg Oral BID   ferrous sulfate  325 mg Oral TID PC   insulin aspart  0-5 Units Subcutaneous QHS   insulin aspart  0-9 Units Subcutaneous TID WC   irbesartan  75 mg Oral Daily   sertraline  50 mg Oral Daily   sodium chloride flush  10-40 mL Intracatheter Q12H    Objective: Vital signs in last 24 hours: Temp:  [98 F (36.7 C)-98.4 F (36.9 C)] 98 F (36.7 C) (08/09 0744) Pulse Rate:  [80-93] 93 (08/09 0744) Resp:  [18] 18 (08/08 2040) BP: (135-144)/(59-70) 144/59 (08/09 0744) SpO2:  [95 %-99 %] 95 % (08/09 0744) Physical Exam  Constitutional:  oriented to person, place, and time. appears well-developed and well-nourished. No distress.  HENT: [place]/AT, PERRLA, no scleral icterus Mouth/Throat: Oropharynx is clear and moist. No oropharyngeal exudate.  Cardiovascular: Normal rate, regular rhythm and normal heart sounds. Exam reveals no gallop and no friction rub.  No murmur heard.  Pulmonary/Chest: Effort normal and breath sounds normal. No respiratory distress.  has no wheezes.  Neck = supple, no nuchal rigidity Abdominal: Soft. Bowel sounds are normal.  exhibits no distension. There is no tenderness.  Lymphadenopathy: no cervical adenopathy. No axillary adenopathy Neurological: alert and oriented to person, place, and time.  UEA:VWUJ knee wrapped Skin: Skin is warm  and dry. No rash noted. No erythema.  Psychiatric: a normal mood and affect.  behavior is normal.    Lab Results Recent Labs    09/19/22 0048 09/20/22 0315 09/20/22 1216 09/21/22 0940  WBC 13.6*  --   --  6.7  HGB 7.5*  --  8.3* 7.9*  HCT 26.1*  --  28.5* 27.0*  NA 131* 136  --  135  K 5.3* 5.1  --  4.9  CL 96* 106  --  104  CO2 23 24  --  25  BUN 45* 50*  --  34*  CREATININE 1.53* 1.41*  --  1.25*    Sedimentation Rate Recent Labs    09/19/22 0048  ESRSEDRATE 32*   C-Reactive Protein Recent Labs    09/19/22 0048  CRP 8.3*    Microbiology: reviewed Studies/Results: VAS [place] LOWER EXTREMITY VENOUS (DVT)  Result Date: 09/21/2022  Lower Venous DVT Study Patient Name:  Shelly [name]  Date of Exam:   09/21/2022 Medical Rec #: [id number]         Accession #:    [id number] Date of Birth: 11/30/1940         Patient Gender: F Patient Age:   42 years Exam Location:  [facility] Procedure:      VAS [place] LOWER EXTREMITY VENOUS (DVT) Referring Phys: Judyann Munson --------------------------------------------------------------------------------  Indications: Tender calf.  Limitations: Patient unable to tolerate compressions, body habitus and poor ultrasound/tissue interface. Comparison Study: No previous study Performing Technologist: McKayla Maag RVT, [place]  Examination Guidelines: A complete evaluation includes B-mode imaging, spectral Doppler, color Doppler, and power Doppler as needed of all accessible portions of each vessel. Bilateral testing is considered an integral part of a complete examination. Limited examinations for reoccurring indications may be performed as noted. The reflux portion of the exam is performed with the patient in reverse Trendelenburg.  +--------+---------------+---------+-----------+----------+--------------------+ RIGHT   CompressibilityPhasicitySpontaneityPropertiesThrombus Aging        +--------+---------------+---------+-----------+----------+--------------------+ CFV     Full           Yes      Yes                                       +--------+---------------+---------+-----------+----------+--------------------+ SFJ     Full                                                              +--------+---------------+---------+-----------+----------+--------------------+ FV Prox Full                                                              +--------+---------------+---------+-----------+----------+--------------------+ FV Mid  Full                                                              +--------+---------------+---------+-----------+----------+--------------------+ FV                     Yes      Yes                  patent by color      Distal                                               doppler              +--------+---------------+---------+-----------+----------+--------------------+ PFV     Full                                                              +--------+---------------+---------+-----------+----------+--------------------+ POP     Full           Yes      Yes                                       +--------+---------------+---------+-----------+----------+--------------------+  PTV     Full                                                              +--------+---------------+---------+-----------+----------+--------------------+ PERO                                                 Not visualized       +--------+---------------+---------+-----------+----------+--------------------+   +----+---------------+---------+-----------+----------+--------------+ LEFTCompressibilityPhasicitySpontaneityPropertiesThrombus Aging +----+---------------+---------+-----------+----------+--------------+ CFV Full           Yes      Yes                                  +----+---------------+---------+-----------+----------+--------------+ SFJ Full                                                        +----+---------------+---------+-----------+----------+--------------+    Summary: RIGHT: - There is no evidence of deep vein thrombosis in the lower extremity. However, portions of this examination were limited- see technologist comments above.  - No cystic structure found in the popliteal fossa.  LEFT: - No evidence of common femoral vein obstruction.  *See table(s) above for measurements and observations.    Preliminary      Assessment/Plan: MSSA left knee pji s/p 1 staged revision= continue on cefazolin 2gm IV q 8hr with plans to treat for 6 wk followed by doing cefadroxil for the remaining 4.5 months.  Hypoglycemia = resolved, recommend to decrease dose to 2.5mg  glipizide daily  Leukocytosis = improved  Post op anemia = does not appear to have had much response to 1 unit rbc. Defer to dr Roda Shutters should she need another unit of blood transfusion.  We will see back in 5-6wk. Diagnosis: Mssa pji  Culture Result: mssa  Allergies  Allergen Reactions   Celebrex [Celecoxib] Other (See Comments)    Abdominal pain; history of upper GI bleed    Claritin [Loratadine] Hives and Swelling    blisters   Penicillins Hives   Percocet [Oxycodone-Acetaminophen] Other (See Comments)    Severe constipation   --------------------------------------- OPAT Orders Discharge antibiotics to be given via PICC line Discharge antibiotics: Per pharmacy protocol cefazolin 2gm iv q 8hr Aim for Vancomycin trough 15-20 or AUC 400-550 (unless otherwise indicated) Duration: 6 wk End Date: 10/31/2022  [facility] Care Per Protocol:  Home health RN for IV administration and teaching; PICC line care and labs.    Labs weekly while on IV antibiotics: x__ CBC with differential _x_ BMP  __x CRP _x_ ESR __ Vancomycin trough __ CK  _x_ Please pull PIC at completion of IV  antibiotics  Fax weekly labs to [phone]  Clinic Follow Up Appt: In 5-6 wk     [facility] Infectious [facility] Pager: [phone]  09/21/2022, 1:46 PM

## 2022-09-21 NOTE — Progress Notes (Addendum)
Subjective: 4 Days Post-Op Procedure(s) (LRB): LEFT KNEE RESECTION ARTHROPLASTY, REVISION TOTAL KNEE ARTHROPLASTY (Left) Patient reports pain as mild.  Some stomach discomfort.  Had a large bm yesterday.    Objective: Vital signs in last 24 hours: Temp:  [98 F (36.7 C)-98.4 F (36.9 C)] 98 F (36.7 C) (08/09 0744) Pulse Rate:  [80-93] 93 (08/09 0744) Resp:  [18] 18 (08/08 2040) BP: (135-144)/(59-70) 144/59 (08/09 0744) SpO2:  [95 %-99 %] 95 % (08/09 0744)  Intake/Output from previous day: 08/08 0701 - 08/09 0700 In: 670 [P.O.:660; I.V.:10] Out: 0  Intake/Output this shift: No intake/output data recorded.  Recent Labs    09/19/22 0048 09/20/22 1216  HGB 7.5* 8.3*   Recent Labs    09/19/22 0048 09/20/22 1216  WBC 13.6*  --   RBC 3.54*  --   HCT 26.1* 28.5*  PLT 435*  --    Recent Labs    09/19/22 0048 09/20/22 0315  NA 131* 136  K 5.3* 5.1  CL 96* 106  CO2 23 24  BUN 45* 50*  CREATININE 1.53* 1.41*  GLUCOSE 132* 54*  CALCIUM 8.9 8.2*   No results for input(s): "LABPT", "INR" in the last 72 hours.  Neurologically intact Neurovascular intact Sensation intact distally Intact pulses distally Dorsiflexion/Plantar flexion intact Incision: dressing C/D/I No cellulitis present Compartment soft Ivac in place with good seal.  125 ml blood in canister   Assessment/Plan: 4 Days Post-Op Procedure(s) (LRB): LEFT KNEE RESECTION ARTHROPLASTY, REVISION TOTAL KNEE ARTHROPLASTY (Left) Advance diet Up with therapy Discharge to SNF once bed available and insurance approves WBAT LLE.  Must wear knee immobilizer when bearing weight Please swap out ivac hospital unit with portable prevena day of discharge, not sooner.  ABLA- due to blood shortage and patient remaining asymptomatic, she does not qualify for blood Left knee cx MSSA- cefazolin via picc line x 6 weeks.   RLE u/s negative for dvt Glipizide being held for 48 hours due to AKI with episodic hypoglycemia Asa  and narcotic rx on patient chart F/u with [place] in one week for wound check      Cristie Hem 09/21/2022, 9:53 AM

## 2022-09-22 LAB — GLUCOSE, CAPILLARY
Glucose-Capillary: 167 mg/dL — ABNORMAL HIGH (ref 70–99)
Glucose-Capillary: 143 mg/dL — ABNORMAL HIGH (ref 70–99)
Glucose-Capillary: 164 mg/dL — ABNORMAL HIGH (ref 70–99)
Glucose-Capillary: 182 mg/dL — ABNORMAL HIGH (ref 70–99)

## 2022-09-22 NOTE — Progress Notes (Signed)
   Subjective:  Patient reports pain as mild.  No events.  Objective:   VITALS:   Vitals:   09/21/22 1448 09/21/22 2014 09/22/22 0500 09/22/22 0801  BP: 124/78 118/69 125/80 119/69  Pulse: 99 98 99 95  Resp:  16 16 18   Temp: 97.6 F (36.4 C) 98.5 F (36.9 C) 98 F (36.7 C) 98 F (36.7 C)  TempSrc: Oral     SpO2: 94% 92% 95% 100%  Weight:      Height:        Exam stable IVAC with good seal, canister with same 100 cc of drainage  Lab Results  Component Value Date   WBC 6.7 09/21/2022   HGB 7.9 (L) 09/21/2022   HCT 27.0 (L) 09/21/2022   MCV 74.8 (L) 09/21/2022   PLT 429 (H) 09/21/2022     Assessment/Plan:  5 Days Post-Op   - stable from ortho stand point for d/c to SNF once insurance clears - RN to switch to portable prevena upon discharge - ID recommendations finalized  Glee Arvin 09/22/2022, 10:12 AM

## 2022-09-22 NOTE — Progress Notes (Signed)
Physical Therapy Treatment Patient Details Name: Shelly [name] MRN: [id number] DOB: [birth year] Today's Date: 09/22/2022   History of Present Illness Patient is 82 y.o. female s/p Lt TKR to femoral, tibial, and patellar components on 09/17/22 due to prosthetic join infection. PMH significant for OA, asthma, breast cnacer, GERD, HTN, HLD, DM2, macular degeneration, Rt TKA in 2011, Lt TKA in 2014.    PT Comments  Pt received in recliner, agreeable to therapy session after eating lunch, with good participation and tolerance for transfer and gait training as well as HEP instruction. HEP brought to room, pt will need reinforcement due to visual deficit and family agreeable to review and reinforce HEP handout with her as well. Pt needing up to modA for sit<>stand transfers and up to [employer] Assist for x2 household distance gait trials with RW support and L KI donned with all standing tasks. Reviewed stair ascent/descent to simulate curb step and pt performing with up to minA for stability. Pt continues to benefit from PT services to progress toward functional mobility goals.    If plan is discharge home, recommend the following: A little help with walking and/or transfers;A little help with bathing/dressing/bathroom;Assistance with cooking/housework;Direct supervision/assist for medications management;Assist for transportation;Help with stairs or ramp for entrance   Can travel by private vehicle     Yes  Equipment Recommendations  None recommended by PT    Recommendations for Other Services       Precautions / Restrictions Precautions Precautions: Fall;Knee Precaution Booklet Issued: Yes (comment) Precaution Comments: Handout brought to room, reinforced KI wear schedule as pt did not have it on in chair Required Braces or Orthoses: Knee Immobilizer - Left Knee Immobilizer - Left: On when out of bed or walking Restrictions Weight Bearing Restrictions: Yes LLE Weight Bearing: Weight  bearing as tolerated Other Position/Activity Restrictions: with KI donned     Mobility  Bed Mobility Overal bed mobility: Needs Assistance             General bed mobility comments: Pt received in chair, defers return to bed    Transfers Overall transfer level: Needs assistance Equipment used: Rolling walker (2 wheels) Transfers: Sit to/from Stand, Bed to chair/wheelchair/BSC Sit to Stand: Min assist, Mod assist           General transfer comment: RW for support, modA for initial power to STS even with use of momentum; min cues for safer UE placement and to keep LLE advanced. minA from toilet using wall rail and from chair on subsequent efforts.    Ambulation/Gait Ambulation/Gait assistance: Contact guard assist Gait Distance (Feet): 125 Feet (x2 with seated break) Assistive device: Rolling walker (2 wheels) Gait Pattern/deviations: Step-through pattern, Decreased step length - right, Decreased step length - left, Decreased stride length, Shuffle, Trunk flexed, Step-to pattern       General Gait Details: cues for activity pacing, pt steady throughout with CGA for safety, short step-through pattern; pt needs seated break halfway through due to BUE fatigue   Stairs Stairs: Yes Stairs assistance: Min assist Stair Management: Step to pattern, Backwards, With walker Number of Stairs: 1 (x2 reps) General stair comments: single 7" step-up x2 reps, no buckling or LOB, min cues for sequencing/safety   Wheelchair Mobility     Tilt Bed    Modified Rankin (Stroke Patients Only)       Balance Overall balance assessment: Needs assistance, History of Falls Sitting-balance support: Feet supported, Single extremity supported, No upper extremity supported Sitting balance-Leahy Scale: Good  Standing balance support: During functional activity, Bilateral upper extremity supported Standing balance-Leahy Scale: Fair Standing balance comment: able to stand at sink and  reach for soap/hand towels, RW for support, able to lean on sink to wash hands while standing as needed.                            Cognition Arousal: Alert Behavior During Therapy: WFL for tasks assessed/performed Overall Cognitive Status: Within Functional Limits for tasks assessed                                 General Comments: Pt reports she forgets to notify staff to assist her with donned L KI prior to OOB; PTA wrote reminder in large marker letters on paper and taped to cabinet in her room to assist her to don KI prior to getting OOB/ambulating        Exercises General Exercises - Lower Extremity Ankle Circles/Pumps: AROM, Both, 10 reps, Supine Quad Sets: AROM, Both, 10 reps, Supine Straight Leg Raises: AAROM, Left, 5 reps, Seated    General Comments General comments (skin integrity, edema, etc.): SpO2 93-95% on RA with exertion, HR ~103 bpm standing; no c/o dizziness or LE fatigue, only UE fatigue      Pertinent Vitals/Pain Pain Assessment Pain Assessment: 0-10 Pain Score: 1  Pain Location: Lt knee Pain Descriptors / Indicators: Discomfort Pain Intervention(s): Monitored during session, Repositioned     PT Goals (current goals can now be found in the care plan section) Acute Rehab PT Goals Patient Stated Goal: To get stronger then go home PT Goal Formulation: With patient Time For Goal Achievement: 10/02/22 Progress towards PT goals: Progressing toward goals    Frequency    Min 1X/week      PT Plan         AM-PAC PT "6 Clicks" Mobility   Outcome Measure  Help needed turning from your back to your side while in a flat bed without using bedrails?: A Little Help needed moving from lying on your back to sitting on the side of a flat bed without using bedrails?: A Little Help needed moving to and from a bed to a chair (including a wheelchair)?: A Little Help needed standing up from a chair using your arms (e.g., wheelchair or  bedside chair)?: A Lot (modA from lower surface height) Help needed to walk in hospital room?: A Little Help needed climbing 3-5 steps with a railing? : A Lot (with single rail or no rails) 6 Click Score: 16    End of Session Equipment Utilized During Treatment: Gait belt;Left knee immobilizer Activity Tolerance: Patient tolerated treatment well Patient left: in chair;with call bell/phone within reach;with chair alarm set Nurse Communication: Mobility status PT Visit Diagnosis: Unsteadiness on feet (R26.81);Muscle weakness (generalized) (M62.81);Difficulty in walking, not elsewhere classified (R26.2);Pain Pain - Right/Left: Left Pain - part of body: Knee     Time: 6213-0865 PT Time Calculation (min) (ACUTE ONLY): 29 min  Charges:    $Gait Training: 8-22 mins $Therapeutic Activity: 8-22 mins PT General Charges $$ ACUTE PT VISIT: 1 Visit                      P., PTA Acute Rehabilitation Services Secure Chat Preferred 9a-5:30pm Office: [phone]    Dorathy Kinsman [facility] 09/22/2022, 1:50 PM

## 2022-09-22 NOTE — Plan of Care (Signed)
  Problem: Coping: Goal: Ability to adjust to condition or change in health will improve Outcome: Progressing   Problem: Fluid Volume: Goal: Ability to maintain a balanced intake and output will improve Outcome: Progressing   Problem: Metabolic: Goal: Ability to maintain appropriate glucose levels will improve Outcome: Progressing   

## 2022-09-22 NOTE — Discharge Summary (Signed)
Patient ID: TISHEENA BRAMEL MRN: [id number] DOB/AGE: April 02, 1940 82 y.o.  Admit date: 09/17/2022 Discharge date: 09/22/2022  Admission Diagnoses:  Infection of prosthetic left knee joint [facility])  Discharge Diagnoses:  Principal Problem:   Infection of prosthetic left knee joint (HCC) Active Problems:   Status post total left knee replacement   Status post revision of total replacement of left knee   Past Medical History:  Diagnosis Date   Arthritis    "qwhere" (07/01/2012)   Asthma    normal chest x-ray in 2010   Basal cell carcinoma of face    Breast cancer (HCC)    Cholelithiasis    Chronic lower back pain    Degenerative joint disease    Right TKA-2010; low back pain; hands and hips also affected   Exertional shortness of breath    "mostly from being too fat" (07/01/2012)   GERD (gastroesophageal reflux disease)    history   H/O hiatal hernia    Heart murmur    small   Hemorrhoid    HTN (hypertension)    Hyperlipidemia    Macular degeneration    "both eyes" (07/01/2012)   PUD (peptic ulcer disease) 2003   Upper GI bleed-gastric ulcer; gastroesophageal reflux disease   Seasonal allergies    Type II diabetes mellitus (HCC)     Surgeries: Procedure(s): LEFT KNEE RESECTION ARTHROPLASTY, REVISION TOTAL KNEE ARTHROPLASTY on 09/17/2022   Consultants (if any):   Discharged Condition: Improved  Hospital Course: NACHELLE APPLEBAUM is an 82 y.o. female who was admitted 09/17/2022 with a diagnosis of Infection of prosthetic left knee joint (HCC) and went to the operating room on 09/17/2022 and underwent the above named procedures.    She was given perioperative antibiotics:  Anti-infectives (From admission, onward)    Start     Dose/Rate Route Frequency Ordered Stop   09/19/22 1015  vancomycin (VANCOREADY) IVPB 750 mg/150 mL  Status:  Discontinued       Placed in "Followed by" Linked Group   750 mg 150 mL/hr over 60 Minutes Intravenous Every 24 hours 09/18/22 0929  09/18/22 1658   09/19/22 0000  ceFAZolin (ANCEF) IVPB        2 g Intravenous Every 8 hours 09/19/22 1444 10/29/22 2359   09/18/22 1800  ceFAZolin (ANCEF) IVPB 2g/100 mL premix        2 g 200 mL/hr over 30 Minutes Intravenous Every 8 hours 09/18/22 1659     09/18/22 0930  vancomycin (VANCOREADY) IVPB 2000 mg/400 mL       Placed in "Followed by" Linked Group   2,000 mg 200 mL/hr over 120 Minutes Intravenous  Once 09/18/22 0929 09/18/22 1329   09/18/22 0800  ceFEPIme (MAXIPIME) 2 g in sodium chloride 0.9 % 100 mL IVPB  Status:  Discontinued        2 g 200 mL/hr over 30 Minutes Intravenous Every 12 hours 09/18/22 0635 09/18/22 1658   09/18/22 0800  vancomycin (VANCOREADY) IVPB 750 mg/150 mL  Status:  Discontinued        750 mg 150 mL/hr over 60 Minutes Intravenous Every 12 hours 09/18/22 0635 09/18/22 0929   09/17/22 2300  ceFAZolin (ANCEF) IVPB 2g/100 mL premix        2 g 200 mL/hr over 30 Minutes Intravenous Every 6 hours 09/17/22 2152 09/18/22 0553   09/17/22 1727  tobramycin (NEBCIN) powder  Status:  Discontinued          As needed 09/17/22 1727 09/17/22  2000   09/17/22 1715  vancomycin (VANCOCIN) powder  Status:  Discontinued          As needed 09/17/22 1716 09/17/22 2000   09/17/22 1400  ceFAZolin (ANCEF) IVPB 2g/100 mL premix        2 g 200 mL/hr over 30 Minutes Intravenous On call to O.R. 09/17/22 1351 09/17/22 1638   09/17/22 1353  ceFAZolin (ANCEF) 2-4 GM/100ML-% IVPB       Note to Pharmacy: Hurshel Keys C: cabinet override      09/17/22 1353 09/17/22 1639     .  She was given sequential compression devices, early ambulation, and appropriate chemoprophylaxis for DVT prophylaxis.  She benefited maximally from the hospital stay and there were no complications.    Recent vital signs:  Vitals:   09/22/22 0500 09/22/22 0801  BP: 125/80 119/69  Pulse: 99 95  Resp: 16 18  Temp: 98 F (36.7 C) 98 F (36.7 C)  SpO2: 95% 100%    Recent laboratory studies:  Lab Results   Component Value Date   HGB 7.9 (L) 09/21/2022   HGB 8.3 (L) 09/20/2022   HGB 7.5 (L) 09/19/2022   Lab Results  Component Value Date   WBC 6.7 09/21/2022   PLT 429 (H) 09/21/2022   Lab Results  Component Value Date   INR 0.98 06/25/2012   Lab Results  Component Value Date   NA 135 09/21/2022   K 4.9 09/21/2022   CL 104 09/21/2022   CO2 25 09/21/2022   BUN 34 (H) 09/21/2022   CREATININE 1.25 (H) 09/21/2022   GLUCOSE 178 (H) 09/21/2022    Discharge Medications:   Allergies as of 09/22/2022       Reactions   Celebrex [celecoxib] Other (See Comments)   Abdominal pain; history of upper GI bleed   Claritin [loratadine] Hives, Swelling   blisters   Penicillins Hives   Percocet [oxycodone-acetaminophen] Other (See Comments)   Severe constipation        Medication List     STOP taking these medications    acetaminophen 650 MG CR tablet Commonly known as: TYLENOL   traMADol 50 MG tablet Commonly known as: ULTRAM       TAKE these medications    albuterol 108 (90 Base) MCG/ACT inhaler Commonly known as: VENTOLIN HFA Inhale 2 puffs into the lungs every 6 (six) hours as needed for shortness of breath.   aspirin 81 MG chewable tablet Chew 1 tablet (81 mg total) by mouth 2 (two) times daily. To be taken after surgery to prevent blood clots   B-12 Compliance Injection 1000 MCG/ML Kit Generic drug: Cyanocobalamin Inject 1 mL as directed every 30 (thirty) days.   carvedilol 3.125 MG tablet Commonly known as: COREG Take 3.125 mg by mouth 2 (two) times daily. Dose reduction per patient   ceFAZolin IVPB Commonly known as: ANCEF Inject 2 g into the vein every 8 (eight) hours. Indication:  MSSA L-knee PJI First Dose: Yes Last Day of Therapy:  10/29/22 Labs - Once weekly:  CBC/D and BMP, Labs - Once weekly: ESR and CRP Method of administration: IV Push of IVPB per SNF protocol Pull PICC line at the completion of IV antibiotic therapy Method of administration may  be changed at the discretion of home infusion pharmacist based upon assessment of the patient and/or caregiver's ability to self-administer the medication ordered.   cetirizine 10 MG tablet Commonly known as: ZYRTEC Take 10 mg by mouth daily as needed for allergies.  cholecalciferol 25 MCG (1000 UNIT) tablet Commonly known as: VITAMIN D3 Take 1,000 Units by mouth daily.   cyclobenzaprine 5 MG tablet Commonly known as: FLEXERIL Take 5 mg by mouth 3 (three) times daily as needed for muscle spasms.   doxycycline 100 MG tablet Commonly known as: VIBRA-TABS Take 1 tablet (100 mg total) by mouth 2 (two) times daily.   furosemide 20 MG tablet Commonly known as: Lasix Take 1 tablet (20 mg total) by mouth daily as needed for edema. What changed:  when to take this additional instructions   glipiZIDE 10 MG 24 hr tablet Commonly known as: GLUCOTROL XL Take 10 mg by mouth 2 (two) times daily.   HYDROcodone-acetaminophen 5-325 MG tablet Commonly known as: NORCO/VICODIN Take 1-2 tablets by mouth every 6 (six) hours as needed for moderate pain (pain score 4-6).   lidocaine 5 % ointment Commonly known as: XYLOCAINE Apply 1 Application topically 3 (three) times daily as needed.   linagliptin 5 MG Tabs tablet Commonly known as: TRADJENTA Take 1 tablet (5 mg total) by mouth daily.   pravastatin 10 MG tablet Commonly known as: PRAVACHOL Take 10 mg by mouth every evening.   PRESERVISION AREDS PO Take 1 tablet by mouth 2 (two) times daily.   sertraline 50 MG tablet Commonly known as: ZOLOFT Take 1 tablet (50 mg total) by mouth daily.   Turmeric 450 MG Caps Take 450 mg by mouth daily.   valsartan 80 MG tablet Commonly known as: DIOVAN Take 1 tablet (80 mg total) by mouth daily.               Home Infusion Instuctions  (From admission, onward)           Start     Ordered   09/19/22 0000  Home infusion instructions       Question:  Instructions  Answer:  Flushing  of vascular access device: 0.9% NaCl pre/post medication administration and prn patency; Heparin 100 u/ml, 5ml for implanted ports and Heparin 10u/ml, 5ml for all other central venous catheters.   09/19/22 1444              Durable Medical Equipment  (From admission, onward)           Start     Ordered   09/17/22 2153  DME Walker rolling  Once       Question Answer Comment  Walker: With 5 Inch Wheels   Patient needs a walker to treat with the following condition Status post left partial knee replacement      09/17/22 2152   09/17/22 2153  DME 3 n 1  Once        09/17/22 2152   09/17/22 2153  DME Bedside commode  Once       Question:  Patient needs a bedside commode to treat with the following condition  Answer:  Status post left partial knee replacement   09/17/22 2152            Diagnostic Studies: VAS [place] LOWER EXTREMITY VENOUS (DVT)  Result Date: 09/21/2022  Lower Venous DVT Study Patient Name:  AZELLA SPINNEY  Date of Exam:   09/21/2022 Medical Rec #: [id number]         Accession #:    [id number] Date of Birth: 02/10/1941         Patient Gender: F Patient Age:   31 years Exam Location:  [facility] Procedure:      VAS [place] LOWER  EXTREMITY VENOUS (DVT) Referring Phys: CYNTHIA SNIDER --------------------------------------------------------------------------------  Indications: Tender calf.  Limitations: Patient unable to tolerate compressions, body habitus and poor ultrasound/tissue interface. Comparison Study: No previous study Performing Technologist: McKayla Maag RVT, [place]  Examination Guidelines: A complete evaluation includes B-mode imaging, spectral Doppler, color Doppler, and power Doppler as needed of all accessible portions of each vessel. Bilateral testing is considered an integral part of a complete examination. Limited examinations for reoccurring indications may be performed as noted. The reflux portion of the exam is performed with the patient in reverse  Trendelenburg.  +--------+---------------+---------+-----------+----------+--------------------+ RIGHT   CompressibilityPhasicitySpontaneityPropertiesThrombus Aging       +--------+---------------+---------+-----------+----------+--------------------+ CFV     Full           Yes      Yes                                       +--------+---------------+---------+-----------+----------+--------------------+ SFJ     Full                                                              +--------+---------------+---------+-----------+----------+--------------------+ FV Prox Full                                                              +--------+---------------+---------+-----------+----------+--------------------+ FV Mid  Full                                                              +--------+---------------+---------+-----------+----------+--------------------+ FV                     Yes      Yes                  patent by color      Distal                                               doppler              +--------+---------------+---------+-----------+----------+--------------------+ PFV     Full                                                              +--------+---------------+---------+-----------+----------+--------------------+ POP     Full           Yes      Yes                                       +--------+---------------+---------+-----------+----------+--------------------+  PTV     Full                                                              +--------+---------------+---------+-----------+----------+--------------------+ PERO                                                 Not visualized       +--------+---------------+---------+-----------+----------+--------------------+   +----+---------------+---------+-----------+----------+--------------+ LEFTCompressibilityPhasicitySpontaneityPropertiesThrombus Aging  +----+---------------+---------+-----------+----------+--------------+ CFV Full           Yes      Yes                                 +----+---------------+---------+-----------+----------+--------------+ SFJ Full                                                        +----+---------------+---------+-----------+----------+--------------+     Summary: RIGHT: - There is no evidence of deep vein thrombosis in the lower extremity. However, portions of this examination were limited- see technologist comments above.  - No cystic structure found in the popliteal fossa.  LEFT: - No evidence of common femoral vein obstruction.  *See table(s) above for measurements and observations. Electronically signed by Gerarda Fraction on 09/21/2022 at 3:35:11 PM.    Final    [place] EKG SITE RITE  Result Date: 09/19/2022 If Site Rite image not attached, placement could not be confirmed due to current cardiac rhythm.  DG Knee Left Port  Result Date: 09/17/2022 CLINICAL DATA:  Postop in PACU. EXAM: PORTABLE LEFT KNEE - 1-2 VIEW COMPARISON:  Radiograph 09/12/2022 FINDINGS: The previous patellar arthroplasty component has been removed. The tibial component has been removed with underlying cement. The femoral component may be new. No fracture. Recent postsurgical change includes air and edema in the soft tissues and joint space. Presumed wound VAC in place. IMPRESSION: Postsurgical change of the left knee. No immediate postoperative complication. Electronically Signed   By: Narda Rutherford M.D.   On: 09/17/2022 21:36   XR Knee 1-2 Views Left  Result Date: 09/12/2022 X-rays demonstrate a left total knee replacement.  There is a lucency below the tibial baseplate.   Disposition: Discharge disposition: 03-Skilled Nursing Facility       Discharge Instructions     Call MD / Call 911   Complete by: As directed    If you experience chest pain or shortness of breath, CALL 911 and be transported to the hospital emergency  room.  If you develope a fever above 101.5 F, pus (white drainage) or increased drainage or redness at the wound, or calf pain, call your surgeon's office.   Constipation Prevention   Complete by: As directed    Drink plenty of fluids.  Prune juice may be helpful.  You may use a stool softener, such as Colace (over the counter) 100 mg twice a day.  Use MiraLax (over the counter) for constipation as needed.   Driving  restrictions   Complete by: As directed    No driving while taking narcotic pain meds.   Home infusion instructions   Complete by: As directed    Instructions: Flushing of vascular access device: 0.9% NaCl pre/post medication administration and prn patency; Heparin 100 u/ml, 5ml for implanted ports and Heparin 10u/ml, 5ml for all other central venous catheters.   Increase activity slowly as tolerated   Complete by: As directed    Post-operative opioid taper instructions:   Complete by: As directed    POST-OPERATIVE OPIOID TAPER INSTRUCTIONS: It is important to wean off of your opioid medication as soon as possible. If you do not need pain medication after your surgery it is ok to stop day one. Opioids include: Codeine, Hydrocodone(Norco, Vicodin), Oxycodone(Percocet, oxycontin) and hydromorphone amongst others.  Long term and even short term use of opiods can cause: Increased pain response Dependence Constipation Depression Respiratory depression And more.  Withdrawal symptoms can include Flu like symptoms Nausea, vomiting And more Techniques to manage these symptoms Hydrate well Eat regular healthy meals Stay active Use relaxation techniques(deep breathing, meditating, yoga) Do Not substitute Alcohol to help with tapering If you have been on opioids for less than two weeks and do not have pain than it is ok to stop all together.  Plan to wean off of opioids This plan should start within one week post op of your joint replacement. Maintain the same interval or time  between taking each dose and first decrease the dose.  Cut the total daily intake of opioids by one tablet each day Next start to increase the time between doses. The last dose that should be eliminated is the evening dose.           Follow-up Information     Cristie Hem, PA-C Follow up in 1 week(s).   Specialty: Orthopedic Surgery Contact information: [address] [place] [address] [phone]                  Signed: Glee Arvin 09/22/2022, 10:18 AM

## 2022-09-23 LAB — GLUCOSE, CAPILLARY
Glucose-Capillary: 125 mg/dL — ABNORMAL HIGH (ref 70–99)
Glucose-Capillary: 156 mg/dL — ABNORMAL HIGH (ref 70–99)
Glucose-Capillary: 145 mg/dL — ABNORMAL HIGH (ref 70–99)

## 2022-09-23 NOTE — Plan of Care (Signed)
  Problem: Health Behavior/Discharge Planning: Goal: Ability to identify and utilize available resources and services will improve Outcome: Progressing Goal: Ability to manage health-related needs will improve Outcome: Progressing   Problem: Fluid Volume: Goal: Ability to maintain a balanced intake and output will improve Outcome: Progressing   Problem: Coping: Goal: Ability to adjust to condition or change in health will improve Outcome: Progressing   Problem: Metabolic: Goal: Ability to maintain appropriate glucose levels will improve Outcome: Progressing   Problem: Nutritional: Goal: Maintenance of adequate nutrition will improve Outcome: Progressing Goal: Progress toward achieving an optimal weight will improve Outcome: Progressing   Problem: Skin Integrity: Goal: Risk for impaired skin integrity will decrease Outcome: Progressing   Problem: Tissue Perfusion: Goal: Adequacy of tissue perfusion will improve Outcome: Progressing   Problem: Activity: Goal: Ability to avoid complications of mobility impairment will improve Outcome: Progressing Goal: Range of joint motion will improve Outcome: Progressing   Problem: Clinical Measurements: Goal: Postoperative complications will be avoided or minimized Outcome: Progressing

## 2022-09-23 NOTE — Progress Notes (Signed)
Patient ID: Shelly [name], female   DOB: 11-Jul-1940, 82 y.o.   MRN: [id number] Patient sitting at bedside eating breakfast she is comfortable.  Plan for discharge to skilled nursing on Monday.

## 2022-09-24 DIAGNOSIS — R609 Edema, unspecified: Secondary | ICD-10-CM | POA: Diagnosis not present

## 2022-09-24 DIAGNOSIS — T8454XD Infection and inflammatory reaction due to internal left knee prosthesis, subsequent encounter: Secondary | ICD-10-CM | POA: Diagnosis not present

## 2022-09-24 DIAGNOSIS — K219 Gastro-esophageal reflux disease without esophagitis: Secondary | ICD-10-CM | POA: Diagnosis not present

## 2022-09-24 DIAGNOSIS — H3530 Unspecified macular degeneration: Secondary | ICD-10-CM | POA: Diagnosis not present

## 2022-09-24 DIAGNOSIS — K279 Peptic ulcer, site unspecified, unspecified as acute or chronic, without hemorrhage or perforation: Secondary | ICD-10-CM | POA: Diagnosis not present

## 2022-09-24 DIAGNOSIS — F330 Major depressive disorder, recurrent, mild: Secondary | ICD-10-CM | POA: Diagnosis not present

## 2022-09-24 DIAGNOSIS — E7849 Other hyperlipidemia: Secondary | ICD-10-CM | POA: Diagnosis not present

## 2022-09-24 DIAGNOSIS — I10 Essential (primary) hypertension: Secondary | ICD-10-CM | POA: Diagnosis not present

## 2022-09-24 DIAGNOSIS — Z96652 Presence of left artificial knee joint: Secondary | ICD-10-CM | POA: Diagnosis not present

## 2022-09-24 DIAGNOSIS — M62838 Other muscle spasm: Secondary | ICD-10-CM | POA: Diagnosis not present

## 2022-09-24 DIAGNOSIS — R531 Weakness: Secondary | ICD-10-CM | POA: Diagnosis not present

## 2022-09-24 DIAGNOSIS — M545 Low back pain, unspecified: Secondary | ICD-10-CM | POA: Diagnosis not present

## 2022-09-24 DIAGNOSIS — M1990 Unspecified osteoarthritis, unspecified site: Secondary | ICD-10-CM | POA: Diagnosis not present

## 2022-09-24 DIAGNOSIS — T8454XA Infection and inflammatory reaction due to internal left knee prosthesis, initial encounter: Secondary | ICD-10-CM | POA: Diagnosis not present

## 2022-09-24 DIAGNOSIS — E119 Type 2 diabetes mellitus without complications: Secondary | ICD-10-CM | POA: Diagnosis not present

## 2022-09-24 DIAGNOSIS — J45909 Unspecified asthma, uncomplicated: Secondary | ICD-10-CM | POA: Diagnosis not present

## 2022-09-24 DIAGNOSIS — H548 Legal blindness, as defined in USA: Secondary | ICD-10-CM | POA: Diagnosis not present

## 2022-09-24 DIAGNOSIS — E785 Hyperlipidemia, unspecified: Secondary | ICD-10-CM | POA: Diagnosis not present

## 2022-09-24 DIAGNOSIS — J309 Allergic rhinitis, unspecified: Secondary | ICD-10-CM | POA: Diagnosis not present

## 2022-09-24 DIAGNOSIS — Z471 Aftercare following joint replacement surgery: Secondary | ICD-10-CM | POA: Diagnosis not present

## 2022-09-24 LAB — GLUCOSE, CAPILLARY: Glucose-Capillary: 122 mg/dL — ABNORMAL HIGH (ref 70–99)

## 2022-09-24 NOTE — Discharge Summary (Signed)
Patient ID: NETANYA SORRENTI MRN: [id number] DOB/AGE: [birth year] 81 y.o.  Admit date: 09/17/2022 Discharge date: 09/24/2022  Admission Diagnoses:  Infection of prosthetic left knee joint [facility])  Discharge Diagnoses:  Principal Problem:   Infection of prosthetic left knee joint (HCC) Active Problems:   Status post total left knee replacement   Status post revision of total replacement of left knee   Past Medical History:  Diagnosis Date   Arthritis    "qwhere" (07/01/2012)   Asthma    normal chest x-ray in 2010   Basal cell carcinoma of face    Breast cancer (HCC)    Cholelithiasis    Chronic lower back pain    Degenerative joint disease    Right TKA-2010; low back pain; hands and hips also affected   Exertional shortness of breath    "mostly from being too fat" (07/01/2012)   GERD (gastroesophageal reflux disease)    history   H/O hiatal hernia    Heart murmur    small   Hemorrhoid    HTN (hypertension)    Hyperlipidemia    Macular degeneration    "both eyes" (07/01/2012)   PUD (peptic ulcer disease) 2003   Upper GI bleed-gastric ulcer; gastroesophageal reflux disease   Seasonal allergies    Type II diabetes mellitus (HCC)     Surgeries: Procedure(s): LEFT KNEE RESECTION ARTHROPLASTY, REVISION TOTAL KNEE ARTHROPLASTY on 09/17/2022   Consultants (if any):   Discharged Condition: Improved  Hospital Course: VIRENE ROSANO is an 82 y.o. female who was admitted 09/17/2022 with a diagnosis of Infection of prosthetic left knee joint (HCC) and went to the operating room on 09/17/2022 and underwent the above named procedures.    She was given perioperative antibiotics:  Anti-infectives (From admission, onward)    Start     Dose/Rate Route Frequency Ordered Stop   09/19/22 1015  vancomycin (VANCOREADY) IVPB 750 mg/150 mL  Status:  Discontinued       Placed in "Followed by" Linked Group   750 mg 150 mL/hr over 60 Minutes Intravenous Every 24 hours 09/18/22 0929  09/18/22 1658   09/19/22 0000  ceFAZolin (ANCEF) IVPB        2 g Intravenous Every 8 hours 09/19/22 1444 10/29/22 2359   09/18/22 1800  ceFAZolin (ANCEF) IVPB 2g/100 mL premix        2 g 200 mL/hr over 30 Minutes Intravenous Every 8 hours 09/18/22 1659     09/18/22 0930  vancomycin (VANCOREADY) IVPB 2000 mg/400 mL       Placed in "Followed by" Linked Group   2,000 mg 200 mL/hr over 120 Minutes Intravenous  Once 09/18/22 0929 09/18/22 1329   09/18/22 0800  ceFEPIme (MAXIPIME) 2 g in sodium chloride 0.9 % 100 mL IVPB  Status:  Discontinued        2 g 200 mL/hr over 30 Minutes Intravenous Every 12 hours 09/18/22 0635 09/18/22 1658   09/18/22 0800  vancomycin (VANCOREADY) IVPB 750 mg/150 mL  Status:  Discontinued        750 mg 150 mL/hr over 60 Minutes Intravenous Every 12 hours 09/18/22 0635 09/18/22 0929   09/17/22 2300  ceFAZolin (ANCEF) IVPB 2g/100 mL premix        2 g 200 mL/hr over 30 Minutes Intravenous Every 6 hours 09/17/22 2152 09/18/22 0553   09/17/22 1727  tobramycin (NEBCIN) powder  Status:  Discontinued          As needed 09/17/22 1727 09/17/22  2000   09/17/22 1715  vancomycin (VANCOCIN) powder  Status:  Discontinued          As needed 09/17/22 1716 09/17/22 2000   09/17/22 1400  ceFAZolin (ANCEF) IVPB 2g/100 mL premix        2 g 200 mL/hr over 30 Minutes Intravenous On call to O.R. 09/17/22 1351 09/17/22 1638   09/17/22 1353  ceFAZolin (ANCEF) 2-4 GM/100ML-% IVPB       Note to Pharmacy: Hurshel Keys C: cabinet override      09/17/22 1353 09/17/22 1639     .  She was given sequential compression devices, early ambulation, and appropriate chemoprophylaxis for DVT prophylaxis.  She benefited maximally from the hospital stay and there were no complications.    Recent vital signs:  Vitals:   09/23/22 2015 09/24/22 0751  BP: 131/60 138/71  Pulse: 94 82  Resp: 18   Temp: 98.1 F (36.7 C) 97.6 F (36.4 C)  SpO2: 96% 97%    Recent laboratory studies:  Lab Results   Component Value Date   HGB 7.9 (L) 09/21/2022   HGB 8.3 (L) 09/20/2022   HGB 7.5 (L) 09/19/2022   Lab Results  Component Value Date   WBC 6.7 09/21/2022   PLT 429 (H) 09/21/2022   Lab Results  Component Value Date   INR 0.98 06/25/2012   Lab Results  Component Value Date   NA 135 09/21/2022   K 4.9 09/21/2022   CL 104 09/21/2022   CO2 25 09/21/2022   BUN 34 (H) 09/21/2022   CREATININE 1.25 (H) 09/21/2022   GLUCOSE 178 (H) 09/21/2022    Discharge Medications:   Allergies as of 09/24/2022       Reactions   Celebrex [celecoxib] Other (See Comments)   Abdominal pain; history of upper GI bleed   Claritin [loratadine] Hives, Swelling   blisters   Penicillins Hives   Percocet [oxycodone-acetaminophen] Other (See Comments)   Severe constipation        Medication List     STOP taking these medications    acetaminophen 650 MG CR tablet Commonly known as: TYLENOL   traMADol 50 MG tablet Commonly known as: ULTRAM       TAKE these medications    albuterol 108 (90 Base) MCG/ACT inhaler Commonly known as: VENTOLIN HFA Inhale 2 puffs into the lungs every 6 (six) hours as needed for shortness of breath.   aspirin 81 MG chewable tablet Chew 1 tablet (81 mg total) by mouth 2 (two) times daily. To be taken after surgery to prevent blood clots   B-12 Compliance Injection 1000 MCG/ML Kit Generic drug: Cyanocobalamin Inject 1 mL as directed every 30 (thirty) days.   carvedilol 3.125 MG tablet Commonly known as: COREG Take 3.125 mg by mouth 2 (two) times daily. Dose reduction per patient   ceFAZolin IVPB Commonly known as: ANCEF Inject 2 g into the vein every 8 (eight) hours. Indication:  MSSA L-knee PJI First Dose: Yes Last Day of Therapy:  10/29/22 Labs - Once weekly:  CBC/D and BMP, Labs - Once weekly: ESR and CRP Method of administration: IV Push of IVPB per SNF protocol Pull PICC line at the completion of IV antibiotic therapy Method of administration may  be changed at the discretion of home infusion pharmacist based upon assessment of the patient and/or caregiver's ability to self-administer the medication ordered.   cetirizine 10 MG tablet Commonly known as: ZYRTEC Take 10 mg by mouth daily as needed for allergies.  cholecalciferol 25 MCG (1000 UNIT) tablet Commonly known as: VITAMIN D3 Take 1,000 Units by mouth daily.   cyclobenzaprine 5 MG tablet Commonly known as: FLEXERIL Take 5 mg by mouth 3 (three) times daily as needed for muscle spasms.   doxycycline 100 MG tablet Commonly known as: VIBRA-TABS Take 1 tablet (100 mg total) by mouth 2 (two) times daily.   furosemide 20 MG tablet Commonly known as: Lasix Take 1 tablet (20 mg total) by mouth daily as needed for edema. What changed:  when to take this additional instructions   glipiZIDE 10 MG 24 hr tablet Commonly known as: GLUCOTROL XL Take 10 mg by mouth 2 (two) times daily.   HYDROcodone-acetaminophen 5-325 MG tablet Commonly known as: NORCO/VICODIN Take 1-2 tablets by mouth every 6 (six) hours as needed for moderate pain (pain score 4-6).   lidocaine 5 % ointment Commonly known as: XYLOCAINE Apply 1 Application topically 3 (three) times daily as needed.   linagliptin 5 MG Tabs tablet Commonly known as: TRADJENTA Take 1 tablet (5 mg total) by mouth daily.   pravastatin 10 MG tablet Commonly known as: PRAVACHOL Take 10 mg by mouth every evening.   PRESERVISION AREDS PO Take 1 tablet by mouth 2 (two) times daily.   sertraline 50 MG tablet Commonly known as: ZOLOFT Take 1 tablet (50 mg total) by mouth daily.   Turmeric 450 MG Caps Take 450 mg by mouth daily.   valsartan 80 MG tablet Commonly known as: DIOVAN Take 1 tablet (80 mg total) by mouth daily.               Home Infusion Instuctions  (From admission, onward)           Start     Ordered   09/19/22 0000  Home infusion instructions       Question:  Instructions  Answer:  Flushing  of vascular access device: 0.9% NaCl pre/post medication administration and prn patency; Heparin 100 u/ml, 5ml for implanted ports and Heparin 10u/ml, 5ml for all other central venous catheters.   09/19/22 1444              Durable Medical Equipment  (From admission, onward)           Start     Ordered   09/17/22 2153  DME Walker rolling  Once       Question Answer Comment  Walker: With 5 Inch Wheels   Patient needs a walker to treat with the following condition Status post left partial knee replacement      09/17/22 2152   09/17/22 2153  DME 3 n 1  Once        09/17/22 2152   09/17/22 2153  DME Bedside commode  Once       Question:  Patient needs a bedside commode to treat with the following condition  Answer:  Status post left partial knee replacement   09/17/22 2152            Diagnostic Studies: VAS [place] LOWER EXTREMITY VENOUS (DVT)  Result Date: 09/21/2022  Lower Venous DVT Study Patient Name:  HILDRED RYKS  Date of Exam:   09/21/2022 Medical Rec #: [id number]         Accession #:    [id number] Date of Birth: 1940/04/07         Patient Gender: F Patient Age:   2 years Exam Location:  [facility] Procedure:      VAS [place] LOWER  EXTREMITY VENOUS (DVT) Referring Phys: CYNTHIA SNIDER --------------------------------------------------------------------------------  Indications: Tender calf.  Limitations: Patient unable to tolerate compressions, body habitus and poor ultrasound/tissue interface. Comparison Study: No previous study Performing Technologist: McKayla Maag RVT, [place]  Examination Guidelines: A complete evaluation includes B-mode imaging, spectral Doppler, color Doppler, and power Doppler as needed of all accessible portions of each vessel. Bilateral testing is considered an integral part of a complete examination. Limited examinations for reoccurring indications may be performed as noted. The reflux portion of the exam is performed with the patient in reverse  Trendelenburg.  +--------+---------------+---------+-----------+----------+--------------------+ RIGHT   CompressibilityPhasicitySpontaneityPropertiesThrombus Aging       +--------+---------------+---------+-----------+----------+--------------------+ CFV     Full           Yes      Yes                                       +--------+---------------+---------+-----------+----------+--------------------+ SFJ     Full                                                              +--------+---------------+---------+-----------+----------+--------------------+ FV Prox Full                                                              +--------+---------------+---------+-----------+----------+--------------------+ FV Mid  Full                                                              +--------+---------------+---------+-----------+----------+--------------------+ FV                     Yes      Yes                  patent by color      Distal                                               doppler              +--------+---------------+---------+-----------+----------+--------------------+ PFV     Full                                                              +--------+---------------+---------+-----------+----------+--------------------+ POP     Full           Yes      Yes                                       +--------+---------------+---------+-----------+----------+--------------------+  PTV     Full                                                              +--------+---------------+---------+-----------+----------+--------------------+ PERO                                                 Not visualized       +--------+---------------+---------+-----------+----------+--------------------+   +----+---------------+---------+-----------+----------+--------------+ LEFTCompressibilityPhasicitySpontaneityPropertiesThrombus Aging  +----+---------------+---------+-----------+----------+--------------+ CFV Full           Yes      Yes                                 +----+---------------+---------+-----------+----------+--------------+ SFJ Full                                                        +----+---------------+---------+-----------+----------+--------------+     Summary: RIGHT: - There is no evidence of deep vein thrombosis in the lower extremity. However, portions of this examination were limited- see technologist comments above.  - No cystic structure found in the popliteal fossa.  LEFT: - No evidence of common femoral vein obstruction.  *See table(s) above for measurements and observations. Electronically signed by Gerarda Fraction on 09/21/2022 at 3:35:11 PM.    Final    [place] EKG SITE RITE  Result Date: 09/19/2022 If Site Rite image not attached, placement could not be confirmed due to current cardiac rhythm.  DG Knee Left Port  Result Date: 09/17/2022 CLINICAL DATA:  Postop in PACU. EXAM: PORTABLE LEFT KNEE - 1-2 VIEW COMPARISON:  Radiograph 09/12/2022 FINDINGS: The previous patellar arthroplasty component has been removed. The tibial component has been removed with underlying cement. The femoral component may be new. No fracture. Recent postsurgical change includes air and edema in the soft tissues and joint space. Presumed wound VAC in place. IMPRESSION: Postsurgical change of the left knee. No immediate postoperative complication. Electronically Signed   By: Narda Rutherford M.D.   On: 09/17/2022 21:36   XR Knee 1-2 Views Left  Result Date: 09/12/2022 X-rays demonstrate a left total knee replacement.  There is a lucency below the tibial baseplate.   Disposition: Discharge disposition: 03-Skilled Nursing Facility       Discharge Instructions     Call MD / Call 911   Complete by: As directed    If you experience chest pain or shortness of breath, CALL 911 and be transported to the hospital emergency  room.  If you develope a fever above 101.5 F, pus (white drainage) or increased drainage or redness at the wound, or calf pain, call your surgeon's office.   Constipation Prevention   Complete by: As directed    Drink plenty of fluids.  Prune juice may be helpful.  You may use a stool softener, such as Colace (over the counter) 100 mg twice a day.  Use MiraLax (over the counter) for constipation as needed.   Driving  restrictions   Complete by: As directed    No driving while taking narcotic pain meds.   Home infusion instructions   Complete by: As directed    Instructions: Flushing of vascular access device: 0.9% NaCl pre/post medication administration and prn patency; Heparin 100 u/ml, 5ml for implanted ports and Heparin 10u/ml, 5ml for all other central venous catheters.   Increase activity slowly as tolerated   Complete by: As directed    Post-operative opioid taper instructions:   Complete by: As directed    POST-OPERATIVE OPIOID TAPER INSTRUCTIONS: It is important to wean off of your opioid medication as soon as possible. If you do not need pain medication after your surgery it is ok to stop day one. Opioids include: Codeine, Hydrocodone(Norco, Vicodin), Oxycodone(Percocet, oxycontin) and hydromorphone amongst others.  Long term and even short term use of opiods can cause: Increased pain response Dependence Constipation Depression Respiratory depression And more.  Withdrawal symptoms can include Flu like symptoms Nausea, vomiting And more Techniques to manage these symptoms Hydrate well Eat regular healthy meals Stay active Use relaxation techniques(deep breathing, meditating, yoga) Do Not substitute Alcohol to help with tapering If you have been on opioids for less than two weeks and do not have pain than it is ok to stop all together.  Plan to wean off of opioids This plan should start within one week post op of your joint replacement. Maintain the same interval or time  between taking each dose and first decrease the dose.  Cut the total daily intake of opioids by one tablet each day Next start to increase the time between doses. The last dose that should be eliminated is the evening dose.           Follow-up Information     Cristie Hem, PA-C Follow up in 1 week(s).   Specialty: Orthopedic Surgery Contact information: [address] [place] [address] [phone]                  Signed: Glee Arvin 09/24/2022, 8:32 AM

## 2022-09-24 NOTE — Plan of Care (Signed)
  Problem: Education: Goal: Ability to describe self-care measures that may prevent or decrease complications (Diabetes Survival Skills Education) will improve Outcome: Progressing Goal: Individualized Educational Video(s) Outcome: Progressing   Problem: Coping: Goal: Ability to adjust to condition or change in health will improve Outcome: Progressing   Problem: Fluid Volume: Goal: Ability to maintain a balanced intake and output will improve Outcome: Progressing   Problem: Health Behavior/Discharge Planning: Goal: Ability to identify and utilize available resources and services will improve Outcome: Progressing Goal: Ability to manage health-related needs will improve Outcome: Progressing   Problem: Metabolic: Goal: Ability to maintain appropriate glucose levels will improve Outcome: Progressing   Problem: Nutritional: Goal: Maintenance of adequate nutrition will improve Outcome: Progressing Goal: Progress toward achieving an optimal weight will improve Outcome: Progressing   Problem: Skin Integrity: Goal: Risk for impaired skin integrity will decrease Outcome: Progressing   Problem: Tissue Perfusion: Goal: Adequacy of tissue perfusion will improve Outcome: Progressing   Problem: Education: Goal: Knowledge of the prescribed therapeutic regimen will improve Outcome: Progressing Goal: Individualized Educational Video(s) Outcome: Progressing   Problem: Activity: Goal: Ability to avoid complications of mobility impairment will improve Outcome: Progressing Goal: Range of joint motion will improve Outcome: Progressing   Problem: Clinical Measurements: Goal: Postoperative complications will be avoided or minimized Outcome: Progressing   Problem: Pain Management: Goal: Pain level will decrease with appropriate interventions Outcome: Progressing   Problem: Skin Integrity: Goal: Will show signs of wound healing Outcome: Progressing   Problem: Education: Goal:  Knowledge of General Education information will improve Description: Including pain rating scale, medication(s)/side effects and non-pharmacologic comfort measures Outcome: Progressing   Problem: Health Behavior/Discharge Planning: Goal: Ability to manage health-related needs will improve Outcome: Progressing   Problem: Clinical Measurements: Goal: Ability to maintain clinical measurements within normal limits will improve Outcome: Progressing Goal: Will remain free from infection Outcome: Progressing Goal: Diagnostic test results will improve Outcome: Progressing Goal: Respiratory complications will improve Outcome: Progressing Goal: Cardiovascular complication will be avoided Outcome: Progressing   Problem: Activity: Goal: Risk for activity intolerance will decrease Outcome: Progressing   Problem: Nutrition: Goal: Adequate nutrition will be maintained Outcome: Progressing   Problem: Pain Managment: Goal: General experience of comfort will improve Outcome: Progressing   Problem: Safety: Goal: Ability to remain free from injury will improve Outcome: Progressing   Problem: Skin Integrity: Goal: Risk for impaired skin integrity will decrease Outcome: Progressing

## 2022-09-24 NOTE — TOC Transition Note (Signed)
Transition of Care [facility]) - CM/SW Discharge Note   Patient Details  Name: Shelly [name] MRN: [id number] Date of Birth: 25-Aug-1940  [facility] of [facility]) CM/SW Contact:  Lorri Frederick, LCSW Phone Number: 09/24/2022, 9:55 AM   Clinical Narrative:   Pt discharging to [facility].  RN call report to [phone].  Pt will be transported by granddaughter and may need pt brought down to main entrance with assistance getting into the vehicle.   Final next level of care: Skilled Nursing Facility Barriers to Discharge: Barriers Resolved   Patient Goals and CMS Choice   Choice offered to / list presented to : Patient  Discharge Placement                Patient chooses bed at:  [facility]) Patient to be transferred to facility by: granddaughter Name of family member notified: daughter Milagros Loll Patient and family notified of of transfer: 09/24/22  Discharge Plan and Services Additional resources added to the After Visit Summary for     Discharge Planning Services: CM Consult                                 Social Determinants of Health (SDOH) Interventions SDOH Screenings   Food Insecurity: No Food Insecurity (09/18/2022)  Housing: Low Risk  (09/18/2022)  Transportation Needs: No Transportation Needs (09/18/2022)  Utilities: Not At Risk (09/18/2022)  Depression (PHQ2-9): Medium Risk (07/17/2022)  Tobacco Use: Medium Risk (09/17/2022)     Readmission Risk Interventions     No data to display

## 2022-09-24 NOTE — TOC Progression Note (Addendum)
Transition of Care [facility]) - Progression Note    Patient Details  Name: Shelly [name] MRN: [id number] Date of Birth: 03-15-1940  [facility] of [facility]) CM/SW Contact  Lorri Frederick, LCSW Phone Number: 09/24/2022, 8:29 AM  Clinical Narrative:   [facility] has been approved by [facility]: 7 days: [id number].    [facility] confirms can receive pt today.  Needs DC summary by noon.  MD informed.    0930: Message from daughter Milagros Loll: pt granddaughter Revonda Standard will be at the hospital between 1030-11am to transport pt to SNF.   Expected Discharge Plan: Skilled Nursing Facility    Expected Discharge Plan and Services   Discharge Planning Services: CM Consult   Living arrangements for the past 2 months: Single Family Home Expected Discharge Date: 09/22/22                                     Social Determinants of Health (SDOH) Interventions SDOH Screenings   Food Insecurity: No Food Insecurity (09/18/2022)  Housing: Low Risk  (09/18/2022)  Transportation Needs: No Transportation Needs (09/18/2022)  Utilities: Not At Risk (09/18/2022)  Depression (PHQ2-9): Medium Risk (07/17/2022)  Tobacco Use: Medium Risk (09/17/2022)    Readmission Risk Interventions     No data to display

## 2022-09-24 NOTE — Progress Notes (Signed)
Subjective: 7 Days Post-Op Procedure(s) (LRB): LEFT KNEE RESECTION ARTHROPLASTY, REVISION TOTAL KNEE ARTHROPLASTY (Left) Patient reports pain as mild.    Objective: Vital signs in last 24 hours: Temp:  [97.6 F (36.4 C)-98.1 F (36.7 C)] 97.6 F (36.4 C) (08/12 0751) Pulse Rate:  [82-94] 82 (08/12 0751) Resp:  [17-18] 18 (08/11 2015) BP: (130-138)/(60-71) 138/71 (08/12 0751) SpO2:  [96 %-97 %] 97 % (08/12 0751)  Intake/Output from previous day: 08/11 0701 - 08/12 0700 In: 360 [P.O.:360] Out: -  Intake/Output this shift: No intake/output data recorded.  Recent Labs    09/21/22 0940  HGB 7.9*   Recent Labs    09/21/22 0940  WBC 6.7  RBC 3.61*  HCT 27.0*  PLT 429*   Recent Labs    09/21/22 0940  NA 135  K 4.9  CL 104  CO2 25  BUN 34*  CREATININE 1.25*  GLUCOSE 178*  CALCIUM 8.2*   No results for input(s): "LABPT", "INR" in the last 72 hours.  Neurologically intact Neurovascular intact Sensation intact distally Intact pulses distally Dorsiflexion/Plantar flexion intact Incision: dressing C/D/I No cellulitis present Compartment soft   Assessment/Plan: 7 Days Post-Op Procedure(s) (LRB): LEFT KNEE RESECTION ARTHROPLASTY, REVISION TOTAL KNEE ARTHROPLASTY (Left) Up with therapy Discharge to SNF today WBAT LLE.  Must wear knee immobilizer when bearing weight Please swap out ivac hospital unit with portable prevena just prior to discharge today  ABLA- asymptomatic and stable Left knee cx MSSA- cefazolin via picc line x 6 weeks.   F/u in one week     Cristie Hem 09/24/2022, 9:02 AM

## 2022-09-26 ENCOUNTER — Encounter: Payer: Self-pay | Admitting: Physician Assistant

## 2022-09-26 ENCOUNTER — Ambulatory Visit (INDEPENDENT_AMBULATORY_CARE_PROVIDER_SITE_OTHER): Payer: PPO | Admitting: Physician Assistant

## 2022-09-26 DIAGNOSIS — Z96652 Presence of left artificial knee joint: Secondary | ICD-10-CM

## 2022-09-26 NOTE — Progress Notes (Signed)
Post-Op Visit Note   Patient: Shelly [name]           Date of Birth: Jan 07, 1941           MRN: [id number] Visit Date: 09/26/2022 PCP: Tommie Sams, DO   Assessment & Plan:  Chief Complaint:  Chief Complaint  Patient presents with   Left Knee - Follow-up    Left total knee arthroplasty revision 09/17/2022   Visit Diagnoses:  1. Status post revision of total replacement of left knee     Plan:  Patient is a pleasant 82 year old female who comes in today approximately 1 week status post left total knee revision 09/17/2022.  Cultures grew out MSSA and she has been on cefazolin via PICC line for which she will continue for a total of 6 weeks.  She is being followed by infectious disease.  She is currently residing at [facility].  She has been compliant wearing her knee immobilizer when ambulating.  She has not been getting any physical therapy but sounds like.  Today, the IVAC was removed.  Examination of the left knee shows a well-healing surgical incision with nylon sutures in place.  There is no active bleeding or drainage.  Calf is soft nontender.  Today, the wound was cleaned and a new Aquacel was applied.  She may begin physical therapy and weight-bear as tolerated wearing the knee immobilizer.  She may take this off when in bed.  Continue with IV antibiotics per ID.  She will follow-up with [place] next week for suture removal.  Call with concerns or questions in the meantime.  Follow-Up Instructions: Return in about 1 week (around 10/03/2022).   Orders:  No orders of the defined types were placed in this encounter.  No orders of the defined types were placed in this encounter.   Imaging: No results found.  PMFS History: Patient Active Problem List   Diagnosis Date Noted   Status post total left knee replacement 09/17/2022   Status post revision of total replacement of left knee 09/17/2022   Infection of prosthetic left knee joint (HCC) 09/16/2022   Chronic pain of right wrist  07/18/2022   Anxiety and depression 06/04/2022   Memory changes 06/04/2022   B12 deficiency 04/17/2022   CKD (chronic kidney disease) stage 3, GFR 30-59 ml/min (HCC) 04/16/2022   Legally blind 04/16/2022   Malignant neoplasm of upper-outer quadrant of left breast in female, estrogen receptor positive (HCC) 09/18/2017   Osteoarthritis of left knee 07/03/2012   Hyperlipidemia    Diabetes mellitus (HCC)    PUD (peptic ulcer disease)    Degenerative joint disease    Essential hypertension 12/07/2008   Past Medical History:  Diagnosis Date   Arthritis    "qwhere" (07/01/2012)   Asthma    normal chest x-ray in 2010   Basal cell carcinoma of face    Breast cancer (HCC)    Cholelithiasis    Chronic lower back pain    Degenerative joint disease    Right TKA-2010; low back pain; hands and hips also affected   Exertional shortness of breath    "mostly from being too fat" (07/01/2012)   GERD (gastroesophageal reflux disease)    history   H/O hiatal hernia    Heart murmur    small   Hemorrhoid    HTN (hypertension)    Hyperlipidemia    Macular degeneration    "both eyes" (07/01/2012)   PUD (peptic ulcer disease) 2003   Upper GI  bleed-gastric ulcer; gastroesophageal reflux disease   Seasonal allergies    Type II diabetes mellitus (HCC)     Family History  Problem Relation Age of Onset   Hodgkin's lymphoma Mother    Cervical cancer Sister        half sister mom's side   Lung cancer Sister    Breast cancer Neg Hx     Past Surgical History:  Procedure Laterality Date   APPENDECTOMY     BREAST LUMPECTOMY Left    BREAST LUMPECTOMY WITH RADIOACTIVE SEED LOCALIZATION Left 10/21/2017   Procedure: BREAST LUMPECTOMY WITH RADIOACTIVE SEED LOCALIZATION;  Surgeon: Claud Kelp, MD;  Location: [facility];  Service: General;  Laterality: Left;   CATARACT EXTRACTION BILATERAL W/ ANTERIOR VITRECTOMY Bilateral    EYE SURGERY Bilateral    "laser in eyes to stop bleeding and  injections for macular degeneration" (07/01/2012)   FOOT SURGERY Bilateral    straighten 1st toe with a wedge.   KNEE ARTHROSCOPY Bilateral    SKIN CANCER EXCISION  2013   "face" (07/01/2012)   TOTAL KNEE ARTHROPLASTY Right 2011   TOTAL KNEE ARTHROPLASTY Left 07/01/2012   TOTAL KNEE ARTHROPLASTY Left 07/01/2012   Procedure: LEFT TOTAL KNEE ARTHROPLASTY;  Surgeon: Valeria Batman, MD;  Location: [facility] OR;  Service: Orthopedics;  Laterality: Left;  Left Total Knee Arthroplasty   TOTAL KNEE REVISION Left 09/17/2022   Procedure: LEFT KNEE RESECTION ARTHROPLASTY, REVISION TOTAL KNEE ARTHROPLASTY;  Surgeon: Tarry Kos, MD;  Location: [facility] OR;  Service: Orthopedics;  Laterality: Left;   Social History   Occupational History   Not on file  Tobacco Use   Smoking status: Former    Current packs/day: 0.00    Average packs/day: 2.5 packs/day for 27.0 years (67.5 ttl pk-yrs)    Types: Cigarettes    Start date: 02/12/1954    Quit date: 02/12/1981    Years since quitting: 41.6   Smokeless tobacco: Never  Vaping Use   Vaping status: Never Used  Substance and Sexual Activity   Alcohol use: No   Drug use: No   Sexual activity: Not Currently

## 2022-09-28 DIAGNOSIS — T8454XD Infection and inflammatory reaction due to internal left knee prosthesis, subsequent encounter: Secondary | ICD-10-CM | POA: Diagnosis not present

## 2022-09-30 DIAGNOSIS — J302 Other seasonal allergic rhinitis: Secondary | ICD-10-CM | POA: Insufficient documentation

## 2022-09-30 DIAGNOSIS — H3530 Unspecified macular degeneration: Secondary | ICD-10-CM | POA: Insufficient documentation

## 2022-09-30 DIAGNOSIS — M1990 Unspecified osteoarthritis, unspecified site: Secondary | ICD-10-CM | POA: Insufficient documentation

## 2022-09-30 DIAGNOSIS — K219 Gastro-esophageal reflux disease without esophagitis: Secondary | ICD-10-CM | POA: Insufficient documentation

## 2022-10-05 ENCOUNTER — Other Ambulatory Visit (INDEPENDENT_AMBULATORY_CARE_PROVIDER_SITE_OTHER): Payer: PPO

## 2022-10-05 ENCOUNTER — Ambulatory Visit: Payer: PPO | Admitting: Orthopaedic Surgery

## 2022-10-05 DIAGNOSIS — Z96652 Presence of left artificial knee joint: Secondary | ICD-10-CM

## 2022-10-05 DIAGNOSIS — T8454XA Infection and inflammatory reaction due to internal left knee prosthesis, initial encounter: Secondary | ICD-10-CM | POA: Diagnosis not present

## 2022-10-05 NOTE — Progress Notes (Signed)
Post-Op Visit Note   Patient: Shelly [name]           Date of Birth: [birth year]           MRN: [id number] Visit Date: 10/05/2022 PCP: Tommie Sams, DO   Assessment & Plan:  Chief Complaint:  Chief Complaint  Patient presents with   Left Knee - Routine Post Op   Visit Diagnoses:  1. Status post revision of total replacement of left knee     Plan: Patient is 2 weeks status post resection arthroplasty and 1 stage revision.  She is doing well overall.  She is on IV Ancef.  She has follow-up with Dr. Ilsa Iha in the near future.  Examination of the left knee shows a healed surgical incision.  She has expected postoperative swelling.  Range of motion is progressing nicely.  Quad function is adequate.  Sutures removed Steri-Strips applied.  At this point she can begin to wean the knee immobilizer as tolerated and based on how she is doing with the physical therapy.  She will continue the antibiotics per Dr. Chaya Jan recommendations.  We will see her back in 4 weeks with repeat x-rays.  Follow-Up Instructions: Return in about 4 weeks (around 11/02/2022).   Orders:  Orders Placed This Encounter  Procedures   XR KNEE 3 VIEW LEFT   No orders of the defined types were placed in this encounter.   Imaging: No results found.  PMFS History: Patient Active Problem List   Diagnosis Date Noted   Status post total left knee replacement 09/17/2022   Status post revision of total replacement of left knee 09/17/2022   Infection of prosthetic left knee joint (HCC) 09/16/2022   Chronic pain of right wrist 07/18/2022   Anxiety and depression 06/04/2022   Memory changes 06/04/2022   B12 deficiency 04/17/2022   CKD (chronic kidney disease) stage 3, GFR 30-59 ml/min (HCC) 04/16/2022   Legally blind 04/16/2022   Malignant neoplasm of upper-outer quadrant of left breast in female, estrogen receptor positive (HCC) 09/18/2017   Osteoarthritis of left knee 07/03/2012   Hyperlipidemia     Diabetes mellitus (HCC)    PUD (peptic ulcer disease)    Degenerative joint disease    Essential hypertension 12/07/2008   Past Medical History:  Diagnosis Date   Arthritis    "qwhere" (07/01/2012)   Asthma    normal chest x-ray in 2010   Basal cell carcinoma of face    Breast cancer (HCC)    Cholelithiasis    Chronic lower back pain    Degenerative joint disease    Right TKA-2010; low back pain; hands and hips also affected   Exertional shortness of breath    "mostly from being too fat" (07/01/2012)   GERD (gastroesophageal reflux disease)    history   H/O hiatal hernia    Heart murmur    small   Hemorrhoid    HTN (hypertension)    Hyperlipidemia    Macular degeneration    "both eyes" (07/01/2012)   PUD (peptic ulcer disease) 2003   Upper GI bleed-gastric ulcer; gastroesophageal reflux disease   Seasonal allergies    Type II diabetes mellitus (HCC)     Family History  Problem Relation Age of Onset   Hodgkin's lymphoma Mother    Cervical cancer Sister        half sister mom's side   Lung cancer Sister    Breast cancer Neg Hx     Past Surgical History:  Procedure Laterality Date   APPENDECTOMY     BREAST LUMPECTOMY Left    BREAST LUMPECTOMY WITH RADIOACTIVE SEED LOCALIZATION Left 10/21/2017   Procedure: BREAST LUMPECTOMY WITH RADIOACTIVE SEED LOCALIZATION;  Surgeon: Claud Kelp, MD;  Location: [facility];  Service: General;  Laterality: Left;   CATARACT EXTRACTION BILATERAL W/ ANTERIOR VITRECTOMY Bilateral    EYE SURGERY Bilateral    "laser in eyes to stop bleeding and injections for macular degeneration" (07/01/2012)   FOOT SURGERY Bilateral    straighten 1st toe with a wedge.   KNEE ARTHROSCOPY Bilateral    SKIN CANCER EXCISION  2013   "face" (07/01/2012)   TOTAL KNEE ARTHROPLASTY Right 2011   TOTAL KNEE ARTHROPLASTY Left 07/01/2012   TOTAL KNEE ARTHROPLASTY Left 07/01/2012   Procedure: LEFT TOTAL KNEE ARTHROPLASTY;  Surgeon: Valeria Batman,  MD;  Location: [facility] OR;  Service: Orthopedics;  Laterality: Left;  Left Total Knee Arthroplasty   TOTAL KNEE REVISION Left 09/17/2022   Procedure: LEFT KNEE RESECTION ARTHROPLASTY, REVISION TOTAL KNEE ARTHROPLASTY;  Surgeon: Tarry Kos, MD;  Location: [facility] OR;  Service: Orthopedics;  Laterality: Left;   Social History   Occupational History   Not on file  Tobacco Use   Smoking status: Former    Current packs/day: 0.00    Average packs/day: 2.5 packs/day for 27.0 years (67.5 ttl pk-yrs)    Types: Cigarettes    Start date: 02/12/1954    Quit date: 02/12/1981    Years since quitting: 41.6   Smokeless tobacco: Never  Vaping Use   Vaping status: Never Used  Substance and Sexual Activity   Alcohol use: No   Drug use: No   Sexual activity: Not Currently

## 2022-10-12 DIAGNOSIS — T8454XA Infection and inflammatory reaction due to internal left knee prosthesis, initial encounter: Secondary | ICD-10-CM | POA: Diagnosis not present

## 2022-10-18 ENCOUNTER — Ambulatory Visit: Payer: PPO | Admitting: Family Medicine

## 2022-10-19 DIAGNOSIS — T8454XA Infection and inflammatory reaction due to internal left knee prosthesis, initial encounter: Secondary | ICD-10-CM | POA: Diagnosis not present

## 2022-10-26 DIAGNOSIS — T8454XA Infection and inflammatory reaction due to internal left knee prosthesis, initial encounter: Secondary | ICD-10-CM | POA: Diagnosis not present

## 2022-10-30 DIAGNOSIS — E7849 Other hyperlipidemia: Secondary | ICD-10-CM | POA: Diagnosis not present

## 2022-10-30 DIAGNOSIS — I10 Essential (primary) hypertension: Secondary | ICD-10-CM | POA: Diagnosis not present

## 2022-10-30 DIAGNOSIS — E119 Type 2 diabetes mellitus without complications: Secondary | ICD-10-CM | POA: Diagnosis not present

## 2022-10-30 DIAGNOSIS — F330 Major depressive disorder, recurrent, mild: Secondary | ICD-10-CM | POA: Diagnosis not present

## 2022-10-31 ENCOUNTER — Telehealth: Payer: Self-pay | Admitting: Family Medicine

## 2022-10-31 DIAGNOSIS — Z87891 Personal history of nicotine dependence: Secondary | ICD-10-CM | POA: Diagnosis not present

## 2022-10-31 DIAGNOSIS — E11319 Type 2 diabetes mellitus with unspecified diabetic retinopathy without macular edema: Secondary | ICD-10-CM | POA: Diagnosis not present

## 2022-10-31 DIAGNOSIS — F32A Depression, unspecified: Secondary | ICD-10-CM | POA: Diagnosis not present

## 2022-10-31 DIAGNOSIS — H3530 Unspecified macular degeneration: Secondary | ICD-10-CM | POA: Diagnosis not present

## 2022-10-31 DIAGNOSIS — T8453XD Infection and inflammatory reaction due to internal right knee prosthesis, subsequent encounter: Secondary | ICD-10-CM | POA: Diagnosis not present

## 2022-10-31 DIAGNOSIS — T8454XA Infection and inflammatory reaction due to internal left knee prosthesis, initial encounter: Secondary | ICD-10-CM | POA: Diagnosis not present

## 2022-10-31 DIAGNOSIS — Z556 Problems related to health literacy: Secondary | ICD-10-CM | POA: Diagnosis not present

## 2022-10-31 DIAGNOSIS — Z7982 Long term (current) use of aspirin: Secondary | ICD-10-CM | POA: Diagnosis not present

## 2022-10-31 DIAGNOSIS — E1121 Type 2 diabetes mellitus with diabetic nephropathy: Secondary | ICD-10-CM | POA: Diagnosis not present

## 2022-10-31 DIAGNOSIS — E785 Hyperlipidemia, unspecified: Secondary | ICD-10-CM | POA: Diagnosis not present

## 2022-10-31 DIAGNOSIS — E1151 Type 2 diabetes mellitus with diabetic peripheral angiopathy without gangrene: Secondary | ICD-10-CM | POA: Diagnosis not present

## 2022-10-31 DIAGNOSIS — M6281 Muscle weakness (generalized): Secondary | ICD-10-CM | POA: Diagnosis not present

## 2022-10-31 DIAGNOSIS — K219 Gastro-esophageal reflux disease without esophagitis: Secondary | ICD-10-CM | POA: Diagnosis not present

## 2022-10-31 DIAGNOSIS — Z7984 Long term (current) use of oral hypoglycemic drugs: Secondary | ICD-10-CM | POA: Diagnosis not present

## 2022-10-31 DIAGNOSIS — G8929 Other chronic pain: Secondary | ICD-10-CM | POA: Diagnosis not present

## 2022-10-31 DIAGNOSIS — Z96651 Presence of right artificial knee joint: Secondary | ICD-10-CM | POA: Diagnosis not present

## 2022-10-31 DIAGNOSIS — Z6835 Body mass index (BMI) 35.0-35.9, adult: Secondary | ICD-10-CM | POA: Diagnosis not present

## 2022-10-31 DIAGNOSIS — H548 Legal blindness, as defined in USA: Secondary | ICD-10-CM | POA: Diagnosis not present

## 2022-10-31 DIAGNOSIS — I10 Essential (primary) hypertension: Secondary | ICD-10-CM | POA: Diagnosis not present

## 2022-10-31 DIAGNOSIS — E1139 Type 2 diabetes mellitus with other diabetic ophthalmic complication: Secondary | ICD-10-CM | POA: Diagnosis not present

## 2022-10-31 DIAGNOSIS — Z8711 Personal history of peptic ulcer disease: Secondary | ICD-10-CM | POA: Diagnosis not present

## 2022-10-31 DIAGNOSIS — M545 Low back pain, unspecified: Secondary | ICD-10-CM | POA: Diagnosis not present

## 2022-10-31 DIAGNOSIS — J45909 Unspecified asthma, uncomplicated: Secondary | ICD-10-CM | POA: Diagnosis not present

## 2022-10-31 NOTE — Telephone Encounter (Signed)
[facility] ([place]) called for verbal orders for PT/OT next week one week one ,one week 2 times a week for 4 weeks if needed . [place] [phone]

## 2022-11-01 ENCOUNTER — Ambulatory Visit: Payer: PPO | Admitting: Internal Medicine

## 2022-11-01 ENCOUNTER — Encounter: Payer: Self-pay | Admitting: Internal Medicine

## 2022-11-01 ENCOUNTER — Other Ambulatory Visit: Payer: Self-pay

## 2022-11-01 VITALS — BP 126/68 | HR 84 | Resp 16 | Ht 63.0 in | Wt 208.2 lb

## 2022-11-01 DIAGNOSIS — T8454XD Infection and inflammatory reaction due to internal left knee prosthesis, subsequent encounter: Secondary | ICD-10-CM

## 2022-11-01 DIAGNOSIS — D509 Iron deficiency anemia, unspecified: Secondary | ICD-10-CM | POA: Diagnosis not present

## 2022-11-01 DIAGNOSIS — H3530 Unspecified macular degeneration: Secondary | ICD-10-CM | POA: Diagnosis not present

## 2022-11-01 MED ORDER — CEFADROXIL 500 MG PO CAPS: 1000.0000 mg | ORAL_CAPSULE | Freq: Two times a day (BID) | ORAL | 4 refills | Status: DC

## 2022-11-01 NOTE — Progress Notes (Signed)
Patient ID: Shelly [name], female   DOB: [birth year], 82 y.o.   MRN: [id number]  HPI Patient was admitted on 09/17/22 for left knee resection arthroplasty, and revision of TKA(femoral,, tibial, patellar components) c/b MSSA pji, she has finished 6 wk of iv cefazolin which finished this week and plans to complete course of therapy with cefadroxil. She is glad to be home, only 2 days thus far, getting back to her routine. She is planning to participate with PT to improve strengthening.  Outpatient Encounter Medications as of 11/01/2022  Medication Sig   albuterol (VENTOLIN HFA) 108 (90 Base) MCG/ACT inhaler Inhale 2 puffs into the lungs every 6 (six) hours as needed for shortness of breath.    aspirin 81 MG chewable tablet Chew 1 tablet (81 mg total) by mouth 2 (two) times daily. To be taken after surgery to prevent blood clots   carvedilol (COREG) 3.125 MG tablet Take 3.125 mg by mouth 2 (two) times daily. Dose reduction per patient   cetirizine (ZYRTEC) 10 MG tablet Take 10 mg by mouth daily as needed for allergies.   cholecalciferol (VITAMIN D3) 25 MCG (1000 UNIT) tablet Take 1,000 Units by mouth daily.   Cyanocobalamin (B-12 COMPLIANCE INJECTION) 1000 MCG/ML KIT Inject 1 mL as directed every 30 (thirty) days.   cyclobenzaprine (FLEXERIL) 5 MG tablet Take 5 mg by mouth 3 (three) times daily as needed for muscle spasms.   furosemide (LASIX) 20 MG tablet Take 1 tablet (20 mg total) by mouth daily as needed for edema. (Patient taking differently: Take 20 mg by mouth 2 (two) times a week. Mon and Thurs)   glipiZIDE (GLUCOTROL XL) 10 MG 24 hr tablet Take 10 mg by mouth 2 (two) times daily.   HYDROcodone-acetaminophen (NORCO/VICODIN) 5-325 MG tablet Take 1-2 tablets by mouth every 6 (six) hours as needed for moderate pain (pain score 4-6).   lidocaine (XYLOCAINE) 5 % ointment Apply 1 Application topically 3 (three) times daily as needed.   linagliptin (TRADJENTA) 5 MG TABS tablet Take 1 tablet (5  mg total) by mouth daily.   Multiple Vitamins-Minerals (PRESERVISION AREDS PO) Take 1 tablet by mouth 2 (two) times daily.    pravastatin (PRAVACHOL) 10 MG tablet Take 10 mg by mouth every evening.   sertraline (ZOLOFT) 50 MG tablet Take 1 tablet (50 mg total) by mouth daily.   Turmeric 450 MG CAPS Take 450 mg by mouth daily.   valsartan (DIOVAN) 80 MG tablet Take 1 tablet (80 mg total) by mouth daily.   No facility-administered encounter medications on file as of 11/01/2022.     Patient Active Problem List   Diagnosis Date Noted   Status post total left knee replacement 09/17/2022   Status post revision of total replacement of left knee 09/17/2022   Infection of prosthetic left knee joint (HCC) 09/16/2022   Chronic pain of right wrist 07/18/2022   Anxiety and depression 06/04/2022   Memory changes 06/04/2022   B12 deficiency 04/17/2022   CKD (chronic kidney disease) stage 3, GFR 30-59 ml/min (HCC) 04/16/2022   Legally blind 04/16/2022   Malignant neoplasm of upper-outer quadrant of left breast in female, estrogen receptor positive (HCC) 09/18/2017   Osteoarthritis of left knee 07/03/2012   Hyperlipidemia    Diabetes mellitus (HCC)    PUD (peptic ulcer disease)    Degenerative joint disease    Essential hypertension 12/07/2008     Health Maintenance Due  Topic Date Due   FOOT EXAM  Never done  DTaP/Tdap/Td (1 - Tdap) Never done   Zoster Vaccines- Shingrix (1 of 2) Never done   Pneumonia Vaccine 7+ Years old (1 of 1 - PCV) Never done     Review of Systems  Constitutional: Negative for fever, chills, diaphoresis, activity change, appetite change, fatigue and unexpected weight change.  HENT: Negative for congestion, sore throat, rhinorrhea, sneezing, trouble swallowing and sinus pressure.  Eyes: Negative for photophobia and visual disturbance.  Respiratory: Negative for cough, chest tightness, shortness of breath, wheezing and stridor.  Cardiovascular: Negative for chest  pain, palpitations and leg swelling.  Gastrointestinal: Negative for nausea, vomiting, abdominal pain, diarrhea, constipation, blood in stool, abdominal distention and anal bleeding.  Genitourinary: Negative for dysuria, hematuria, flank pain and difficulty urinating.  Musculoskeletal: Negative for myalgias, back pain, joint swelling, arthralgias and gait problem.  Skin: Negative for color change, pallor, rash and wound.  Neurological: Negative for dizziness, tremors, weakness and light-headedness.  Hematological: Negative for adenopathy. Does not bruise/bleed easily.  Psychiatric/Behavioral: Negative for behavioral problems, confusion, sleep disturbance, dysphoric mood, decreased concentration and agitation.   Physical Exam  BP 126/68   Pulse 84   Resp 16   Ht 5\' 3"  (1.6 m)   Wt 208 lb 3.2 oz (94.4 kg)   BMI 36.88 kg/m  Physical Exam  Constitutional:  oriented to person, place, and time. appears well-developed and well-nourished. No distress.  HENT: [place]/AT, PERRLA, no scleral icterus Mouth/Throat: Oropharynx is clear and moist. No oropharyngeal exudate.  Cardiovascular: Normal rate, regular rhythm and normal heart sounds. Exam reveals no gallop and no friction rub.  No murmur heard.  Pulmonary/Chest: Effort normal and breath sounds normal. No respiratory distress.  has no wheezes.  Ext: knee incision is well healing Neurological: alert and oriented to person, place, and time.  Skin: Skin is warm and dry. No rash noted. No erythema.  Psychiatric: a normal mood and affect.  behavior is normal.    CBC Lab Results  Component Value Date   WBC 6.7 09/21/2022   RBC 3.61 (L) 09/21/2022   HGB 7.9 (L) 09/21/2022   HCT 27.0 (L) 09/21/2022   PLT 429 (H) 09/21/2022   MCV 74.8 (L) 09/21/2022   MCH 21.9 (L) 09/21/2022   MCHC 29.3 (L) 09/21/2022   RDW 18.1 (H) 09/21/2022   LYMPHSABS 1.2 09/21/2022   MONOABS 0.5 09/21/2022   EOSABS 0.2 09/21/2022    BMET Lab Results  Component Value  Date   NA 135 09/21/2022   K 4.9 09/21/2022   CL 104 09/21/2022   CO2 25 09/21/2022   GLUCOSE 178 (H) 09/21/2022   BUN 34 (H) 09/21/2022   CREATININE 1.25 (H) 09/21/2022   CALCIUM 8.2 (L) 09/21/2022   GFRNONAA 43 (L) 09/21/2022   GFRAA >60 09/25/2017    Lab Results  Component Value Date   ESRSEDRATE 32 (H) 09/19/2022     Assessment and Plan  Microcytic anemia = will let know if need iron supplement  Pji = will start cefadroxil 1000mg  po bid x 4.5 months  Macular degeneration = now legally blind, but is able to have her daugther dispense meds in pill box

## 2022-11-01 NOTE — Telephone Encounter (Signed)
Shelly Sams, DO   ? ?Okay to give verbal.   ? ?

## 2022-11-02 ENCOUNTER — Other Ambulatory Visit (INDEPENDENT_AMBULATORY_CARE_PROVIDER_SITE_OTHER): Payer: Self-pay

## 2022-11-02 ENCOUNTER — Ambulatory Visit: Payer: PPO | Admitting: Physician Assistant

## 2022-11-02 DIAGNOSIS — Z96652 Presence of left artificial knee joint: Secondary | ICD-10-CM | POA: Diagnosis not present

## 2022-11-02 LAB — IRON,TIBC AND FERRITIN PANEL
%SAT: 6 % (calc) — ABNORMAL LOW (ref 16–45)
Ferritin: 20 ng/mL (ref 16–288)
Iron: 19 ug/dL — ABNORMAL LOW (ref 45–160)
TIBC: 344 mcg/dL (calc) (ref 250–450)

## 2022-11-02 NOTE — Progress Notes (Signed)
Post-Op Visit Note   Patient: Shelly [name]           Date of Birth: [birth year]           MRN: [id number] Visit Date: 11/02/2022 PCP: Tommie Sams, DO   Assessment & Plan:  Chief Complaint:  Chief Complaint  Patient presents with   Left Knee - Follow-up   Visit Diagnoses:  1. Status post revision of total replacement of left knee     Plan: Patient is a pleasant 82 year old female who comes in today 6 weeks status post left knee revision, date of surgery 09/17/2022.  She has been doing well.  She has a minimal pain.  She has recently finished antibiotics via PICC line and is now on p.o. cefadroxil.  She is being followed by Dr. Ilsa Iha who she saw recently.  She anticipates that she will be on this for 8 weeks but up to a year.  She has to follow-up with Dr. Ilsa Iha in 8 weeks.  She has been getting home health physical therapy and is ambulating with a walker.  She has been compliant taking a baby aspirin twice daily for DVT prophylaxis.  Examination left knee reveals a healing surgical incision.  She does have a few areas where there is a scab.  She does have moderate swelling to the left lower extremity with pitting edema and erythema.  I think this is more from underlying lymphedema.  We have provided her with ace wraps and a prescription for compression socks.  We would like for her to stay on oral antibiotics for 6 months.  Follow up in 6 weeks with [place] for recheck.    Follow-Up Instructions: Return in about 6 weeks (around 12/14/2022).   Orders:  Orders Placed This Encounter  Procedures   XR Knee 1-2 Views Left   No orders of the defined types were placed in this encounter.   Imaging: XR Knee 1-2 Views Left  Result Date: 11/02/2022 Well-seated prosthesis without complication   PMFS History: Patient Active Problem List   Diagnosis Date Noted   Status post total left knee replacement 09/17/2022   Status post revision of total replacement of left knee 09/17/2022    Infection of prosthetic left knee joint (HCC) 09/16/2022   Chronic pain of right wrist 07/18/2022   Anxiety and depression 06/04/2022   Memory changes 06/04/2022   B12 deficiency 04/17/2022   CKD (chronic kidney disease) stage 3, GFR 30-59 ml/min (HCC) 04/16/2022   Legally blind 04/16/2022   Malignant neoplasm of upper-outer quadrant of left breast in female, estrogen receptor positive (HCC) 09/18/2017   Osteoarthritis of left knee 07/03/2012   Hyperlipidemia    Diabetes mellitus (HCC)    PUD (peptic ulcer disease)    Degenerative joint disease    Essential hypertension 12/07/2008   Past Medical History:  Diagnosis Date   Arthritis    "qwhere" (07/01/2012)   Asthma    normal chest x-ray in 2010   Basal cell carcinoma of face    Breast cancer (HCC)    Cholelithiasis    Chronic lower back pain    Degenerative joint disease    Right TKA-2010; low back pain; hands and hips also affected   Exertional shortness of breath    "mostly from being too fat" (07/01/2012)   GERD (gastroesophageal reflux disease)    history   H/O hiatal hernia    Heart murmur    small   Hemorrhoid    HTN (hypertension)  Hyperlipidemia    Macular degeneration    "both eyes" (07/01/2012)   PUD (peptic ulcer disease) 2003   Upper GI bleed-gastric ulcer; gastroesophageal reflux disease   Seasonal allergies    Type II diabetes mellitus (HCC)     Family History  Problem Relation Age of Onset   Hodgkin's lymphoma Mother    Cervical cancer Sister        half sister mom's side   Lung cancer Sister    Breast cancer Neg Hx     Past Surgical History:  Procedure Laterality Date   APPENDECTOMY     BREAST LUMPECTOMY Left    BREAST LUMPECTOMY WITH RADIOACTIVE SEED LOCALIZATION Left 10/21/2017   Procedure: BREAST LUMPECTOMY WITH RADIOACTIVE SEED LOCALIZATION;  Surgeon: Claud Kelp, MD;  Location: [facility];  Service: General;  Laterality: Left;   CATARACT EXTRACTION BILATERAL W/ ANTERIOR  VITRECTOMY Bilateral    EYE SURGERY Bilateral    "laser in eyes to stop bleeding and injections for macular degeneration" (07/01/2012)   FOOT SURGERY Bilateral    straighten 1st toe with a wedge.   KNEE ARTHROSCOPY Bilateral    SKIN CANCER EXCISION  2013   "face" (07/01/2012)   TOTAL KNEE ARTHROPLASTY Right 2011   TOTAL KNEE ARTHROPLASTY Left 07/01/2012   TOTAL KNEE ARTHROPLASTY Left 07/01/2012   Procedure: LEFT TOTAL KNEE ARTHROPLASTY;  Surgeon: Valeria Batman, MD;  Location: [facility] OR;  Service: Orthopedics;  Laterality: Left;  Left Total Knee Arthroplasty   TOTAL KNEE REVISION Left 09/17/2022   Procedure: LEFT KNEE RESECTION ARTHROPLASTY, REVISION TOTAL KNEE ARTHROPLASTY;  Surgeon: Tarry Kos, MD;  Location: [facility] OR;  Service: Orthopedics;  Laterality: Left;   Social History   Occupational History   Not on file  Tobacco Use   Smoking status: Former    Current packs/day: 0.00    Average packs/day: 2.5 packs/day for 27.0 years (67.5 ttl pk-yrs)    Types: Cigarettes    Start date: 02/12/1954    Quit date: 02/12/1981    Years since quitting: 41.7   Smokeless tobacco: Never  Vaping Use   Vaping status: Never Used  Substance and Sexual Activity   Alcohol use: No   Drug use: No   Sexual activity: Not Currently

## 2022-11-06 NOTE — Telephone Encounter (Signed)
[place] [facility] home health notified

## 2022-11-09 ENCOUNTER — Ambulatory Visit (INDEPENDENT_AMBULATORY_CARE_PROVIDER_SITE_OTHER): Payer: PPO

## 2022-11-09 VITALS — Ht 63.0 in | Wt 202.0 lb

## 2022-11-09 DIAGNOSIS — Z0000 Encounter for general adult medical examination without abnormal findings: Secondary | ICD-10-CM | POA: Diagnosis not present

## 2022-11-09 NOTE — Progress Notes (Signed)
Because this visit was a virtual/telehealth visit,  certain criteria was not obtained, such a blood pressure, CBG if applicable, and timed get up and go. Any medications not marked as "taking" were not mentioned during the medication reconciliation part of the visit. Any vitals not documented were not able to be obtained due to this being a telehealth visit or patient was unable to self-report a recent blood pressure reading due to a lack of equipment at home via telehealth. Vitals that have been documented are verbally provided by the patient.   Subjective:   Shelly [name] is a 82 y.o. female who presents for an Initial Medicare Annual Wellness Visit.  Visit Complete: Virtual  I connected with  Shelly [name] on 11/09/22 by a audio enabled telemedicine application and verified that I am speaking with the correct person using two identifiers.  Patient Location: Home  Provider Location: Home Office  I discussed the limitations of evaluation and management by telemedicine. The patient expressed understanding and agreed to proceed.  Patient Medicare AWV questionnaire was completed by the patient on na; I have confirmed that all information answered by patient is correct and no changes since this date.  Cardiac Risk Factors include: advanced age (>28men, >58 women);diabetes mellitus;dyslipidemia;hypertension;obesity (BMI >30kg/m2);sedentary lifestyle     Objective:    Today's Vitals   11/09/22 1344 11/09/22 1349  Weight: 202 lb (91.6 kg)   Height: 5\' 3"  (1.6 m)   PainSc:  0-No pain   Body mass index is 35.78 kg/m.     11/09/2022    1:48 PM 09/18/2022    1:09 AM 09/17/2022    2:17 PM 02/24/2018   11:29 AM 10/21/2017    6:36 AM 10/16/2017   11:05 AM 09/12/2017    3:38 PM  Advanced Directives  Does Patient Have a Medical Advance Directive? No;Yes Yes Yes Yes No No No  Type of Estate agent of [place];Living will Healthcare Power of [employer] of  [place];Living will     Does patient want to make changes to medical advance directive?  No - Patient declined       Copy of Healthcare Power of Attorney in Chart? Yes - validated most recent copy scanned in chart (See row information)        Would patient like information on creating a medical advance directive? No - Patient declined No - Patient declined --  No - Patient declined No - Patient declined No - Patient declined    Current Medications (verified) Outpatient Encounter Medications as of 11/09/2022  Medication Sig   albuterol (VENTOLIN HFA) 108 (90 Base) MCG/ACT inhaler Inhale 2 puffs into the lungs every 6 (six) hours as needed for shortness of breath.   aspirin 81 MG chewable tablet Chew 1 tablet (81 mg total) by mouth 2 (two) times daily. To be taken after surgery to prevent blood clots   carvedilol (COREG) 3.125 MG tablet Take 3.125 mg by mouth 2 (two) times daily. Dose reduction per patient   cefadroxil (DURICEF) 500 MG capsule Take 2 capsules (1,000 mg total) by mouth 2 (two) times daily.   cetirizine (ZYRTEC) 10 MG tablet Take 10 mg by mouth daily as needed for allergies.   cholecalciferol (VITAMIN D3) 25 MCG (1000 UNIT) tablet Take 1,000 Units by mouth daily.   Cyanocobalamin (B-12 COMPLIANCE INJECTION) 1000 MCG/ML KIT Inject 1 mL as directed every 30 (thirty) days.   cyclobenzaprine (FLEXERIL) 5 MG tablet Take 5 mg by mouth 3 (  three) times daily as needed for muscle spasms.   furosemide (LASIX) 20 MG tablet Take 1 tablet (20 mg total) by mouth daily as needed for edema.   glipiZIDE (GLUCOTROL XL) 10 MG 24 hr tablet Take 10 mg by mouth 2 (two) times daily.   HYDROcodone-acetaminophen (NORCO/VICODIN) 5-325 MG tablet Take 1-2 tablets by mouth every 6 (six) hours as needed for moderate pain (pain score 4-6).   lidocaine (XYLOCAINE) 5 % ointment Apply 1 Application topically 3 (three) times daily as needed.   linagliptin (TRADJENTA) 5 MG TABS tablet Take 1 tablet (5 mg total) by  mouth daily.   Multiple Vitamins-Minerals (PRESERVISION AREDS PO) Take 1 tablet by mouth 2 (two) times daily.   pravastatin (PRAVACHOL) 10 MG tablet Take 10 mg by mouth every evening.   sertraline (ZOLOFT) 50 MG tablet Take 1 tablet (50 mg total) by mouth daily.   Turmeric 450 MG CAPS Take 450 mg by mouth daily.   valsartan (DIOVAN) 80 MG tablet Take 1 tablet (80 mg total) by mouth daily.   No facility-administered encounter medications on file as of 11/09/2022.    Allergies (verified) Celebrex [celecoxib], Claritin [loratadine], Penicillins, and Percocet [oxycodone-acetaminophen]   History: Past Medical History:  Diagnosis Date   Arthritis    "qwhere" (07/01/2012)   Asthma    normal chest x-ray in 2010   Basal cell carcinoma of face    Breast cancer (HCC)    Cholelithiasis    Chronic lower back pain    Degenerative joint disease    Right TKA-2010; low back pain; hands and hips also affected   Exertional shortness of breath    "mostly from being too fat" (07/01/2012)   GERD (gastroesophageal reflux disease)    history   H/O hiatal hernia    Heart murmur    small   Hemorrhoid    HTN (hypertension)    Hyperlipidemia    Macular degeneration    "both eyes" (07/01/2012)   PUD (peptic ulcer disease) 2003   Upper GI bleed-gastric ulcer; gastroesophageal reflux disease   Seasonal allergies    Type II diabetes mellitus (HCC)    Past Surgical History:  Procedure Laterality Date   APPENDECTOMY     BREAST LUMPECTOMY Left    BREAST LUMPECTOMY WITH RADIOACTIVE SEED LOCALIZATION Left 10/21/2017   Procedure: BREAST LUMPECTOMY WITH RADIOACTIVE SEED LOCALIZATION;  Surgeon: Claud Kelp, MD;  Location: [facility];  Service: General;  Laterality: Left;   CATARACT EXTRACTION BILATERAL W/ ANTERIOR VITRECTOMY Bilateral    EYE SURGERY Bilateral    "laser in eyes to stop bleeding and injections for macular degeneration" (07/01/2012)   FOOT SURGERY Bilateral    straighten 1st  toe with a wedge.   KNEE ARTHROSCOPY Bilateral    SKIN CANCER EXCISION  2013   "face" (07/01/2012)   TOTAL KNEE ARTHROPLASTY Right 2011   TOTAL KNEE ARTHROPLASTY Left 07/01/2012   TOTAL KNEE ARTHROPLASTY Left 07/01/2012   Procedure: LEFT TOTAL KNEE ARTHROPLASTY;  Surgeon: Valeria Batman, MD;  Location[facility];  Service: Orthopedics;  Laterality: Left;  Left Total Knee Arthroplasty   TOTAL KNEE REVISION Left 09/17/2022   Procedure: LEFT KNEE RESECTION ARTHROPLASTY, REVISION TOTAL KNEE ARTHROPLASTY;  Surgeon: Tarry Kos, MD;  Location: [facility] OR;  Service: Orthopedics;  Laterality: Left;   Family History  Problem Relation Age of Onset   Hodgkin's lymphoma Mother    Cervical cancer Sister        half sister mom's side  Lung cancer Sister    Breast cancer Neg Hx    Social History   Socioeconomic History   Marital status: Divorced    Spouse name: Not on file   Number of children: 2   Years of education: Not on file   Highest education level: Not on file  Occupational History   Not on file  Tobacco Use   Smoking status: Former    Current packs/day: 0.00    Average packs/day: 2.5 packs/day for 27.0 years (67.5 ttl pk-yrs)    Types: Cigarettes    Start date: 02/12/1954    Quit date: 02/12/1981    Years since quitting: 41.7   Smokeless tobacco: Never  Vaping Use   Vaping status: Never Used  Substance and Sexual Activity   Alcohol use: No   Drug use: No   Sexual activity: Not Currently  Other Topics Concern   Not on file  Social History Narrative   Lives locally. Works part time at the [employer].    Social Determinants of Health   Financial Resource Strain: Low Risk  (11/09/2022)   Overall Financial Resource Strain (CARDIA)    Difficulty of Paying Living Expenses: Not hard at all  Food Insecurity: No Food Insecurity (11/09/2022)   Hunger Vital Sign    Worried About Running Out of Food in the Last Year: Never true    Ran Out of Food in the Last Year:  Never true  Transportation Needs: No Transportation Needs (11/09/2022)   PRAPARE - Administrator, Civil Service (Medical): No    Lack of Transportation (Non-Medical): No  Physical Activity: Inactive (11/09/2022)   Exercise Vital Sign    Days of Exercise per Week: 0 days    Minutes of Exercise per Session: 0 min  Stress: No Stress Concern Present (11/09/2022)   [employer] of Occupational Health - Occupational Stress Questionnaire    Feeling of Stress : Not at all  Social Connections: Moderately Isolated (11/09/2022)   Social Connection and Isolation Panel [NHANES]    Frequency of Communication with Friends and Family: More than three times a week    Frequency of Social Gatherings with Friends and Family: More than three times a week    Attends Religious Services: More than 4 times per year    Active Member of [employer] or Organizations: No    Attends Engineer, structural: Never    Marital Status: Divorced    Tobacco Counseling Counseling given: Yes   Clinical Intake:  Pre-visit preparation completed: Yes  Pain : No/denies pain Pain Score: 0-No pain     BMI - recorded: 35.78 Nutritional Risks: None Diabetes: Yes CBG done?: No (telehealth visit) Did pt. bring in CBG monitor from home?: No  How often do you need to have someone help you when you read instructions, pamphlets, or other written materials from your doctor or pharmacy?: 5 - Always (legally blind)  Interpreter Needed?: No  Information entered by :: Abby Tashyra Adduci, CMA   Activities of Daily Living    11/09/2022    2:04 PM 09/18/2022    1:11 AM  In your present state of health, do you have any difficulty performing the following activities:  Hearing? 0   Vision? 1   Comment legally blind   Difficulty concentrating or making decisions? 0   Walking or climbing stairs? 1   Comment uses walker   Dressing or bathing? 1   Comment can do most independently but daughter  helps her with washing  her hair   Doing errands, shopping? 0 0  Preparing Food and eating ? Y   Comment due to vision loss patient no longer cooks, she is able to make tv dinners.   Using the Toilet? N   In the past six months, have you accidently leaked urine? N   Do you have problems with loss of bowel control? N   Managing your Medications? Y   Comment daughters help manage her medications and put themn in a pill box monthly   Managing your Finances? Y   Comment daughter has POA and takes care of patients finances   Housekeeping or managing your Housekeeping? Y   Comment daughters help her     Patient Care Team: Tommie Sams, DO as PCP - General (Family Medicine) Wyline Mood Dorothe Pea, MD as PCP - Cardiology (Cardiology) Valeria Batman, MD (Inactive) (Orthopedic Surgery) Gean Birchwood, MD (Orthopedic Surgery) Claud Kelp, MD as Consulting Physician (General Surgery) Serena Croissant, MD as Consulting Physician (Hematology and Oncology) Dorothy Puffer, MD as Consulting Physician (Radiation Oncology) Axel Filler Larna Daughters, NP as Nurse Practitioner (Hematology and Oncology)  Indicate any recent Medical Services you may have received from other than [facility] providers in the past year (date may be approximate).     Assessment:   This is a routine wellness examination for Shelly [name].  Hearing/Vision screen Hearing Screening - Comments:: Patient denies any hearing difficulties.   Vision Screening - Comments:: Patient is legally blind due to macular degeneration and sees Dr. Ashley Royalty in [place]   Goals Addressed             This Visit's Progress    Patient Stated       To live to be a 89       Depression Screen    11/09/2022    1:57 PM 11/01/2022    1:46 PM 07/17/2022    2:58 PM 06/04/2022    3:37 PM 09/12/2017    3:40 PM  PHQ 2/9 Scores  PHQ - 2 Score 0 0 2 4 0  PHQ- 9 Score 0  7 18     Fall Risk    11/09/2022    2:04 PM 11/01/2022    1:46 PM 06/04/2022    3:36 PM 09/12/2018   12:41 PM   Fall Risk   Falls in the past year? 1 1 1 1   Comment    Emmi Telephone Survey: data to providers prior to load  Number falls in past yr: 1 1 1 1   Comment    Emmi Telephone Survey Actual Response = 3  Injury with Fall? 1 0 1 1  Risk for fall due to : Impaired balance/gait;Orthopedic patient;Impaired mobility;Impaired vision;History of fall(s)  History of fall(s)   Follow up Education provided;Falls prevention discussed;Falls evaluation completed  Falls evaluation completed     MEDICARE RISK AT HOME: Medicare Risk at Home Any stairs in or around the home?: No If so, are there any without handrails?: No Home free of loose throw rugs in walkways, pet beds, electrical cords, etc?: Yes Adequate lighting in your home to reduce risk of falls?: Yes Life alert?: No Use of a cane, walker or w/c?: Yes Grab bars in the bathroom?: No Shower chair or bench in shower?: Yes Elevated toilet seat or a handicapped toilet?: Yes  TIMED UP AND GO:  Was the test performed? No    Cognitive Function:        11/09/2022  1:56 PM  6CIT Screen  What Year? 0 points  What month? 0 points  What time? 0 points  Count back from 20 0 points  Months in reverse 0 points  Repeat phrase 0 points  Total Score 0 points    Immunizations Immunization History  Administered Date(s) Administered   Influenza-Unspecified 11/15/2021   Pfizer Covid-19 Vaccine Bivalent Booster 8yrs & up 11/15/2021    TDAP status: Due, Education has been provided regarding the importance of this vaccine. Advised may receive this vaccine at local pharmacy or Health Dept. Aware to provide a copy of the vaccination record if obtained from local pharmacy or Health Dept. Verbalized acceptance and understanding.  Flu Vaccine status: Up to date  Pneumococcal vaccine status: Due, Education has been provided regarding the importance of this vaccine. Advised may receive this vaccine at local pharmacy or Health Dept. Aware to provide a  copy of the vaccination record if obtained from local pharmacy or Health Dept. Verbalized acceptance and understanding.  Covid-19 vaccine status: Information provided on how to obtain vaccines.   Qualifies for Shingles Vaccine? Yes   Zostavax completed No   Shingrix Completed?: No.    Education has been provided regarding the importance of this vaccine. Patient has been advised to call insurance company to determine out of pocket expense if they have not yet received this vaccine. Advised may also receive vaccine at local pharmacy or Health Dept. Verbalized acceptance and understanding.  Screening Tests Health Maintenance  Topic Date Due   FOOT EXAM  Never done   DTaP/Tdap/Td (1 - Tdap) Never done   Zoster Vaccines- Shingrix (1 of 2) Never done   Pneumonia Vaccine 72+ Years old (1 of 1 - PCV) Never done   Medicare Annual Wellness (AWV)  01/04/2023   COVID-19 Vaccine (2 - Pfizer risk series) 07/17/2023 (Originally 12/06/2021)   HEMOGLOBIN A1C  01/31/2023   OPHTHALMOLOGY EXAM  03/16/2023   Diabetic kidney evaluation - Urine ACR  04/16/2023   Diabetic kidney evaluation - eGFR measurement  09/21/2023   INFLUENZA VACCINE  Completed   DEXA SCAN  Completed   HPV VACCINES  Aged Out    Health Maintenance  Health Maintenance Due  Topic Date Due   FOOT EXAM  Never done   DTaP/Tdap/Td (1 - Tdap) Never done   Zoster Vaccines- Shingrix (1 of 2) Never done   Pneumonia Vaccine 69+ Years old (1 of 1 - PCV) Never done   Medicare Annual Wellness (AWV)  01/04/2023    Colorectal cancer screening: No longer required.   Mammogram status: No longer required due to age.  Bone Density Screening: Not age appropriate for this patient.   Lung Cancer Screening: (Low Dose CT Chest recommended if Age 43-80 years, 20 pack-year currently smoking OR have quit w/in 15years.) does not qualify.   Lung Cancer Screening Referral: na  Additional Screening:  Hepatitis C Screening: does not qualify;   Vision  Screening: Recommended annual ophthalmology exams for early detection of glaucoma and other disorders of the eye. Is the patient up to date with their annual eye exam?  Yes  Who is the provider or what is the name of the office in which the patient attends annual eye exams? Dr. Alan Mulder If pt is not established with a provider, would they like to be referred to a provider to establish care? No .   Dental Screening: Recommended annual dental exams for proper oral hygiene  Diabetic Foot Exam: Diabetic Foot Exam: Overdue, Pt  has been advised about the importance in completing this exam. Pt is scheduled for diabetic foot exam on Provider notified.  Community Resource Referral / Chronic Care Management: CRR required this visit?  No   CCM required this visit?  No     Plan:     I have personally reviewed and noted the following in the patient's chart:   Medical and social history Use of alcohol, tobacco or illicit drugs  Current medications and supplements including opioid prescriptions. Patient is not currently taking opioid prescriptions. Functional ability and status Nutritional status Physical activity Advanced directives List of other physicians Hospitalizations, surgeries, and ER visits in previous 12 months Vitals Screenings to include cognitive, depression, and falls Referrals and appointments  In addition, I have reviewed and discussed with patient certain preventive protocols, quality metrics, and best practice recommendations. A written personalized care plan for preventive services as well as general preventive health recommendations were provided to patient.     Jordan Hawks Winston Misner, CMA   11/09/2022   After Visit Summary: (MyChart) Due to this being a telephonic visit, the after visit summary with patients personalized plan was offered to patient via MyChart   Nurse Notes: patient is due for a diabetic foot exam.

## 2022-11-09 NOTE — Patient Instructions (Signed)
Shelly [name] , Thank you for taking time to come for your Medicare Wellness Visit. I appreciate your ongoing commitment to your health goals. Please review the following plan we discussed and let me know if I can assist you in the future.   Referrals/Orders/Follow-Ups/Clinician Recommendations:  Next Medicare Annual Wellness Visit: November 15, 2023 at 9:20 virtual visit   This is a list of the screening recommended for you and due dates:  Health Maintenance  Topic Date Due   Complete foot exam   Never done   DTaP/Tdap/Td vaccine (1 - Tdap) Never done   Zoster (Shingles) Vaccine (1 of 2) Never done   Pneumonia Vaccine (1 of 1 - PCV) Never done   COVID-19 Vaccine (2 - Pfizer risk series) 07/17/2023*   Hemoglobin A1C  01/31/2023   Eye exam for diabetics  03/16/2023   Yearly kidney health urinalysis for diabetes  04/16/2023   Yearly kidney function blood test for diabetes  09/21/2023   Medicare Annual Wellness Visit  11/09/2023   Flu Shot  Completed   DEXA scan (bone density measurement)  Completed   HPV Vaccine  Aged Out  *Topic was postponed. The date shown is not the original due date.    Advanced directives: (In Chart) A copy of your advanced directives are scanned into your chart should your provider ever need it.  Next Medicare Annual Wellness Visit scheduled for next year: Yes  Preventive Care 13 Years and Older, Female Preventive care refers to lifestyle choices and visits with your health care provider that can promote health and wellness. Preventive care visits are also called wellness exams. What can I expect for my preventive care visit? Counseling Your health care provider may ask you questions about your: Medical history, including: Past medical problems. Family medical history. Pregnancy and menstrual history. History of falls. Current health, including: Memory and ability to understand (cognition). Emotional well-being. Home life and relationship  well-being. Sexual activity and sexual health. Lifestyle, including: Alcohol, nicotine or tobacco, and drug use. Access to firearms. Diet, exercise, and sleep habits. Work and work Astronomer. Sunscreen use. Safety issues such as seatbelt and bike helmet use. Physical exam Your health care provider will check your: Height and weight. These may be used to calculate your BMI (body mass index). BMI is a measurement that tells if you are at a healthy weight. Waist circumference. This measures the distance around your waistline. This measurement also tells if you are at a healthy weight and may help predict your risk of certain diseases, such as type 2 diabetes and high blood pressure. Heart rate and blood pressure. Body temperature. Skin for abnormal spots. What immunizations do I need?  Vaccines are usually given at various ages, according to a schedule. Your health care provider will recommend vaccines for you based on your age, medical history, and lifestyle or other factors, such as travel or where you work. What tests do I need? Screening Your health care provider may recommend screening tests for certain conditions. This may include: Lipid and cholesterol levels. Hepatitis C test. Hepatitis B test. HIV (human immunodeficiency virus) test. STI (sexually transmitted infection) testing, if you are at risk. Lung cancer screening. Colorectal cancer screening. Diabetes screening. This is done by checking your blood sugar (glucose) after you have not eaten for a while (fasting). Mammogram. Talk with your health care provider about how often you should have regular mammograms. BRCA-related cancer screening. This may be done if you have a family history of breast, ovarian,  tubal, or peritoneal cancers. Bone density scan. This is done to screen for osteoporosis. Talk with your health care provider about your test results, treatment options, and if necessary, the need for more tests. Follow  these instructions at home: Eating and drinking  Eat a diet that includes fresh fruits and vegetables, whole grains, lean protein, and low-fat dairy products. Limit your intake of foods with high amounts of sugar, saturated fats, and salt. Take vitamin and mineral supplements as recommended by your health care provider. Do not drink alcohol if your health care provider tells you not to drink. If you drink alcohol: Limit how much you have to 0-1 drink a day. Know how much alcohol is in your drink. In the [place] one drink equals one 12 oz bottle of beer (355 mL), one 5 oz glass of wine (148 mL), or one 1 oz glass of hard liquor (44 mL). Lifestyle Brush your teeth every morning and night with fluoride toothpaste. Floss one time each day. Exercise for at least 30 minutes 5 or more days each week. Do not use any products that contain nicotine or tobacco. These products include cigarettes, chewing tobacco, and vaping devices, such as e-cigarettes. If you need help quitting, ask your health care provider. Do not use drugs. If you are sexually active, practice safe sex. Use a condom or other form of protection in order to prevent STIs. Take aspirin only as told by your health care provider. Make sure that you understand how much to take and what form to take. Work with your health care provider to find out whether it is safe and beneficial for you to take aspirin daily. Ask your health care provider if you need to take a cholesterol-lowering medicine (statin). Find healthy ways to manage stress, such as: Meditation, yoga, or listening to music. Journaling. Talking to a trusted person. Spending time with friends and family. Minimize exposure to UV radiation to reduce your risk of skin cancer. Safety Always wear your seat belt while driving or riding in a vehicle. Do not drive: If you have been drinking alcohol. Do not ride with someone who has been drinking. When you are tired or distracted. While  texting. If you have been using any mind-altering substances or drugs. Wear a helmet and other protective equipment during sports activities. If you have firearms in your house, make sure you follow all gun safety procedures. What's next? Visit your health care provider once a year for an annual wellness visit. Ask your health care provider how often you should have your eyes and teeth checked. Stay up to date on all vaccines. This information is not intended to replace advice given to you by your health care provider. Make sure you discuss any questions you have with your health care provider. Document Revised: 07/27/2020 Document Reviewed: 07/27/2020 Elsevier Patient Education  2024 [employer]. Understanding Your Risk for Falls Millions of people have serious injuries from falls each year. It is important to understand your risk of falling. Talk with your health care provider about your risk and what you can do to lower it. If you do have a serious fall, make sure to tell your provider. Falling once raises your risk of falling again. How can falls affect me? Serious injuries from falls are common. These include: Broken bones, such as hip fractures. Head injuries, such as traumatic brain injuries (TBI) or concussions. A fear of falling can cause you to avoid activities and stay at home. This can make your  muscles weaker and raise your risk for a fall. What can increase my risk? There are a number of risk factors that increase your risk for falling. The more risk factors you have, the higher your risk of falling. Serious injuries from a fall happen most often to people who are older than 81 years old. Teenagers and young adults ages 61-29 are also at higher risk. Common risk factors include: Weakness in the lower body. Being generally weak or confused due to long-term (chronic) illness. Dizziness or balance problems. Poor vision. Medicines that cause dizziness or drowsiness. These may  include: Medicines for your blood pressure, heart, anxiety, insomnia, or swelling (edema). Pain medicines. Muscle relaxants. Other risk factors include: Drinking alcohol. Having had a fall in the past. Having foot pain or wearing improper footwear. Working at a dangerous job. Having any of the following in your home: Tripping hazards, such as floor clutter or loose rugs. Poor lighting. Pets. Having dementia or memory loss. What actions can I take to lower my risk of falling?     Physical activity Stay physically fit. Do strength and balance exercises. Consider taking a regular class to build strength and balance. Yoga and tai chi are good options. Vision Have your eyes checked every year and your prescription for glasses or contacts updated as needed. Shoes and walking aids Wear non-skid shoes. Wear shoes that have rubber soles and low heels. Do not wear high heels. Do not walk around the house in socks or slippers. Use a cane or walker as told by your provider. Home safety Attach secure railings on both sides of your stairs. Install grab bars for your bathtub, shower, and toilet. Use a non-skid mat in your bathtub or shower. Attach bath mats securely with double-sided, non-slip rug tape. Use good lighting in all rooms. Keep a flashlight near your bed. Make sure there is a clear path from your bed to the bathroom. Use night-lights. Do not use throw rugs. Make sure all carpeting is taped or tacked down securely. Remove all clutter from walkways and stairways, including extension cords. Repair uneven or broken steps and floors. Avoid walking on icy or slippery surfaces. Walk on the grass instead of on icy or slick sidewalks. Use ice melter to get rid of ice on walkways in the winter. Use a cordless phone. Questions to ask your health care provider Can you help me check my risk for a fall? Do any of my medicines make me more likely to fall? Should I take a vitamin D  supplement? What exercises can I do to improve my strength and balance? Should I make an appointment to have my vision checked? Do I need a bone density test to check for weak bones (osteoporosis)? Would it help to use a cane or a walker? Where to find more information Centers for Disease Control and Prevention, STEADI: TonerPromos.no Community-Based Fall Prevention Programs: TonerPromos.no [employer] on Aging: BaseRingTones.pl Contact a health care provider if: You fall at home. You are afraid of falling at home. You feel weak, drowsy, or dizzy. This information is not intended to replace advice given to you by your health care provider. Make sure you discuss any questions you have with your health care provider. Document Revised: 10/02/2021 Document Reviewed: 10/02/2021 Elsevier Patient Education  2024 [employer].

## 2022-11-16 ENCOUNTER — Ambulatory Visit (INDEPENDENT_AMBULATORY_CARE_PROVIDER_SITE_OTHER): Payer: PPO | Admitting: Family Medicine

## 2022-11-16 VITALS — BP 122/85 | Ht 63.0 in | Wt 202.4 lb

## 2022-11-16 DIAGNOSIS — F419 Anxiety disorder, unspecified: Secondary | ICD-10-CM

## 2022-11-16 DIAGNOSIS — D509 Iron deficiency anemia, unspecified: Secondary | ICD-10-CM | POA: Diagnosis not present

## 2022-11-16 DIAGNOSIS — E1122 Type 2 diabetes mellitus with diabetic chronic kidney disease: Secondary | ICD-10-CM | POA: Diagnosis not present

## 2022-11-16 DIAGNOSIS — I10 Essential (primary) hypertension: Secondary | ICD-10-CM | POA: Diagnosis not present

## 2022-11-16 DIAGNOSIS — Z7984 Long term (current) use of oral hypoglycemic drugs: Secondary | ICD-10-CM

## 2022-11-16 DIAGNOSIS — E11319 Type 2 diabetes mellitus with unspecified diabetic retinopathy without macular edema: Secondary | ICD-10-CM | POA: Diagnosis not present

## 2022-11-16 DIAGNOSIS — N183 Chronic kidney disease, stage 3 unspecified: Secondary | ICD-10-CM

## 2022-11-16 DIAGNOSIS — F32A Depression, unspecified: Secondary | ICD-10-CM

## 2022-11-16 DIAGNOSIS — M6281 Muscle weakness (generalized): Secondary | ICD-10-CM | POA: Diagnosis not present

## 2022-11-16 DIAGNOSIS — R262 Difficulty in walking, not elsewhere classified: Secondary | ICD-10-CM | POA: Diagnosis not present

## 2022-11-16 MED ORDER — IRON (FERROUS SULFATE) 325 (65 FE) MG PO TABS: 325.0000 mg | ORAL_TABLET | ORAL | 1 refills | Status: DC

## 2022-11-16 MED ORDER — SERTRALINE HCL 100 MG PO TABS: 100.0000 mg | ORAL_TABLET | Freq: Every day | ORAL | 3 refills | Status: DC

## 2022-11-16 NOTE — Progress Notes (Unsigned)
-+  ifo 

## 2022-11-16 NOTE — Patient Instructions (Signed)
Lab ordered.  Referral placed.  Follow up in 1 month.

## 2022-11-17 LAB — HEMOGLOBIN A1C
Est. average glucose Bld gHb Est-mCnc: 111 mg/dL
Hgb A1c MFr Bld: 5.5 % (ref 4.8–5.6)

## 2022-11-18 DIAGNOSIS — D509 Iron deficiency anemia, unspecified: Secondary | ICD-10-CM | POA: Insufficient documentation

## 2022-11-18 NOTE — Assessment & Plan Note (Signed)
Increasing Zoloft.

## 2022-11-18 NOTE — Assessment & Plan Note (Signed)
A1C return @ 5.5. Stopping Tradjenta. Discontinuing Glipizide.

## 2022-11-18 NOTE — Assessment & Plan Note (Signed)
Avoid use of lasix. Continue Coreg and Valsartan.

## 2022-11-18 NOTE — Assessment & Plan Note (Addendum)
Starting PO iron. Referring to Hematology for IV iron. FOBT stool testing.

## 2022-12-05 ENCOUNTER — Inpatient Hospital Stay: Payer: PPO

## 2022-12-05 ENCOUNTER — Inpatient Hospital Stay: Payer: PPO | Attending: Hematology | Admitting: Hematology

## 2022-12-05 VITALS — BP 135/62 | HR 82 | Temp 98.0°F | Resp 19 | Ht 63.0 in | Wt 192.8 lb

## 2022-12-05 DIAGNOSIS — N189 Chronic kidney disease, unspecified: Secondary | ICD-10-CM | POA: Insufficient documentation

## 2022-12-05 DIAGNOSIS — Z801 Family history of malignant neoplasm of trachea, bronchus and lung: Secondary | ICD-10-CM | POA: Diagnosis not present

## 2022-12-05 DIAGNOSIS — Z853 Personal history of malignant neoplasm of breast: Secondary | ICD-10-CM | POA: Diagnosis not present

## 2022-12-05 DIAGNOSIS — D509 Iron deficiency anemia, unspecified: Secondary | ICD-10-CM | POA: Diagnosis not present

## 2022-12-05 DIAGNOSIS — Z87891 Personal history of nicotine dependence: Secondary | ICD-10-CM | POA: Diagnosis not present

## 2022-12-05 DIAGNOSIS — K921 Melena: Secondary | ICD-10-CM | POA: Diagnosis not present

## 2022-12-05 DIAGNOSIS — Z8049 Family history of malignant neoplasm of other genital organs: Secondary | ICD-10-CM | POA: Insufficient documentation

## 2022-12-05 DIAGNOSIS — Z807 Family history of other malignant neoplasms of lymphoid, hematopoietic and related tissues: Secondary | ICD-10-CM | POA: Diagnosis not present

## 2022-12-05 DIAGNOSIS — D631 Anemia in chronic kidney disease: Secondary | ICD-10-CM | POA: Diagnosis not present

## 2022-12-05 DIAGNOSIS — D508 Other iron deficiency anemias: Secondary | ICD-10-CM

## 2022-12-05 NOTE — Progress Notes (Signed)
[facility] [address] [place] [address]   Clinic Day:  12/05/2022  Referring physician: Tommie Sams, DO  Patient Care Team: Tommie Sams, DO as PCP - General (Family Medicine) Wyline Mood Dorothe Pea, MD as PCP - Cardiology (Cardiology) Valeria Batman, MD (Inactive) (Orthopedic Surgery) Gean Birchwood, MD (Orthopedic Surgery) Claud Kelp, MD as Consulting Physician (General Surgery) Serena Croissant, MD as Consulting Physician (Hematology and Oncology) Dorothy Puffer, MD as Consulting Physician (Radiation Oncology) Axel Filler, Larna Daughters, NP as Nurse Practitioner (Hematology and Oncology) Sherrie George, MD as Consulting Physician (Ophthalmology)   ASSESSMENT & PLAN:   Assessment:  1.  Iron deficiency anemia: - Patient seen at the request of Dr. Adriana Simas - Anemia from combination of CKD and iron deficiency. - Recent ferritin was 20.  She has been on iron pills for many months.  Denies any bleeding per rectum but has occasional dark stools.  No prior transfusion.  No ice pica. - She has a history of left breast cancer and completed antiestrogen therapy  2.  Social/family history: - She lives by herself at home.  She is legally blind and cannot drive.  Quit smoking at age 68. - Mother had cancer, type not known to the patient.  Sister had smoldering myeloma.   Plan:  1.  Iron deficiency anemia: - She is taking stool sample to be checked for occult blood to Dr. Patsey Berthold clinic today. - We discussed role of parenteral iron therapy as she does not have improvement in her hemoglobin despite taking iron tablet for the last few months. - We will give her Monoferric 1 g x 1.  We discussed side effects including rare chance of anaphylactic reaction. - Recommend follow-up in 6 weeks with repeat CBC, ferritin and iron panel.  Will also check for other nutritional deficiencies including B12, folic acid, MMA, copper levels and bone marrow infiltrative process with  SPEP, free light chains and immunofixation.   Orders Placed This Encounter  Procedures   CBC    Standing Status:   Future    Standing Expiration Date:   12/05/2023   Iron and TIBC ([facility] ONLY)    Standing Status:   Future    Standing Expiration Date:   12/05/2023   Ferritin    Standing Status:   Future    Standing Expiration Date:   12/05/2023   Lactate dehydrogenase    Standing Status:   Future    Standing Expiration Date:   12/05/2023   Reticulocytes    Standing Status:   Future    Standing Expiration Date:   12/05/2023   Methylmalonic acid, serum    Standing Status:   Future    Standing Expiration Date:   12/05/2023   Vitamin B12    Standing Status:   Future    Standing Expiration Date:   12/05/2023   Folate    Standing Status:   Future    Standing Expiration Date:   12/05/2023   Immunofixation electrophoresis    Standing Status:   Future    Standing Expiration Date:   12/05/2023   Protein electrophoresis, serum    Standing Status:   Future    Standing Expiration Date:   12/05/2023   Kappa/lambda light chains    Standing Status:   Future    Standing Expiration Date:   12/05/2023   Copper, serum    Standing Status:   Future    Standing Expiration Date:   12/05/2023  I,Katie Daubenspeck,acting as a Neurosurgeon for Doreatha Massed, MD.,have documented all relevant documentation on the behalf of Doreatha Massed, MD,as directed by  Doreatha Massed, MD while in the presence of Doreatha Massed, MD.   I, Doreatha Massed MD, have reviewed the above documentation for accuracy and completeness, and I agree with the above.   Doreatha Massed, MD   10/23/20244:29 PM  CHIEF COMPLAINT/PURPOSE OF CONSULT:   Diagnosis: Iron deficiency anemia  Current Therapy: Parenteral iron therapy  HISTORY OF PRESENT ILLNESS:   Shelly [name] is a 82 y.o. female presenting to clinic today for evaluation of iron deficiency anemia at the request of  Dr. Adriana Simas.  Of note, this patient has a personal history of breast cancer-- diagnosed in 08/2017, s/p left lumpectomy on 10/31/17, s/p 4.5 years of tamoxifen (discontinued 03/13/22 due to leg swelling).  Today, she states that she is doing well overall. Her appetite level is at 100%. Her energy level is at 75%.   PAST MEDICAL HISTORY:   Past Medical History: Past Medical History:  Diagnosis Date   Arthritis    "qwhere" (07/01/2012)   Asthma    normal chest x-ray in 2010   Basal cell carcinoma of face    Breast cancer (HCC)    Cholelithiasis    Chronic lower back pain    Degenerative joint disease    Right TKA-2010; low back pain; hands and hips also affected   Exertional shortness of breath    "mostly from being too fat" (07/01/2012)   GERD (gastroesophageal reflux disease)    history   H/O hiatal hernia    Heart murmur    small   Hemorrhoid    HTN (hypertension)    Hyperlipidemia    Macular degeneration    "both eyes" (07/01/2012)   PUD (peptic ulcer disease) 2003   Upper GI bleed-gastric ulcer; gastroesophageal reflux disease   Seasonal allergies    Type II diabetes mellitus (HCC)     Surgical History: Past Surgical History:  Procedure Laterality Date   APPENDECTOMY     BREAST LUMPECTOMY Left    BREAST LUMPECTOMY WITH RADIOACTIVE SEED LOCALIZATION Left 10/21/2017   Procedure: BREAST LUMPECTOMY WITH RADIOACTIVE SEED LOCALIZATION;  Surgeon: Claud Kelp, MD;  Location: [facility];  Service: General;  Laterality: Left;   CATARACT EXTRACTION BILATERAL W/ ANTERIOR VITRECTOMY Bilateral    EYE SURGERY Bilateral    "laser in eyes to stop bleeding and injections for macular degeneration" (07/01/2012)   FOOT SURGERY Bilateral    straighten 1st toe with a wedge.   KNEE ARTHROSCOPY Bilateral    SKIN CANCER EXCISION  2013   "face" (07/01/2012)   TOTAL KNEE ARTHROPLASTY Right 2011   TOTAL KNEE ARTHROPLASTY Left 07/01/2012   TOTAL KNEE ARTHROPLASTY Left 07/01/2012    Procedure: LEFT TOTAL KNEE ARTHROPLASTY;  Surgeon: Valeria Batman, MD;  Location: [facility];  Service: Orthopedics;  Laterality: Left;  Left Total Knee Arthroplasty   TOTAL KNEE REVISION Left 09/17/2022   Procedure: LEFT KNEE RESECTION ARTHROPLASTY, REVISION TOTAL KNEE ARTHROPLASTY;  Surgeon: Tarry Kos, MD;  Location: [facility] OR;  Service: Orthopedics;  Laterality: Left;    Social History: Social History   Socioeconomic History   Marital status: Divorced    Spouse name: Not on file   Number of children: 2   Years of education: Not on file   Highest education level: Not on file  Occupational History   Not on file  Tobacco Use   Smoking status: Former  Current packs/day: 0.00    Average packs/day: 2.5 packs/day for 27.0 years (67.5 ttl pk-yrs)    Types: Cigarettes    Start date: 02/12/1954    Quit date: 02/12/1981    Years since quitting: 41.8   Smokeless tobacco: Never  Vaping Use   Vaping status: Never Used  Substance and Sexual Activity   Alcohol use: No   Drug use: No   Sexual activity: Not Currently  Other Topics Concern   Not on file  Social History Narrative   Lives locally. Works part time at the [employer].    Social Determinants of Health   Financial Resource Strain: Low Risk  (11/09/2022)   Overall Financial Resource Strain (CARDIA)    Difficulty of Paying Living Expenses: Not hard at all  Food Insecurity: No Food Insecurity (11/09/2022)   Hunger Vital Sign    Worried About Running Out of Food in the Last Year: Never true    Ran Out of Food in the Last Year: Never true  Transportation Needs: No Transportation Needs (11/09/2022)   PRAPARE - Administrator, Civil Service (Medical): No    Lack of Transportation (Non-Medical): No  Physical Activity: Inactive (11/09/2022)   Exercise Vital Sign    Days of Exercise per Week: 0 days    Minutes of Exercise per Session: 0 min  Stress: No Stress Concern Present (11/09/2022)   [employer] of Occupational Health - Occupational Stress Questionnaire    Feeling of Stress : Not at all  Social Connections: Moderately Isolated (11/09/2022)   Social Connection and Isolation Panel [NHANES]    Frequency of Communication with Friends and Family: More than three times a week    Frequency of Social Gatherings with Friends and Family: More than three times a week    Attends Religious Services: More than 4 times per year    Active Member of [employer] or Organizations: No    Attends Banker Meetings: Never    Marital Status: Divorced  Catering manager Violence: Not At Risk (11/09/2022)   Humiliation, Afraid, Rape, and Kick questionnaire    Fear of Current or Ex-Partner: No    Emotionally Abused: No    Physically Abused: No    Sexually Abused: No    Family History: Family History  Problem Relation Age of Onset   Hodgkin's lymphoma Mother    Cervical cancer Sister        half sister mom's side   Lung cancer Sister    Breast cancer Neg Hx     Current Medications:  Current Outpatient Medications:    albuterol (VENTOLIN HFA) 108 (90 Base) MCG/ACT inhaler, Inhale 2 puffs into the lungs every 6 (six) hours as needed for shortness of breath., Disp: , Rfl:    aspirin 81 MG chewable tablet, Chew 1 tablet (81 mg total) by mouth 2 (two) times daily. To be taken after surgery to prevent blood clots, Disp: 84 tablet, Rfl: 0   carvedilol (COREG) 3.125 MG tablet, Take 3.125 mg by mouth 2 (two) times daily. Dose reduction per patient, Disp: , Rfl:    cefadroxil (DURICEF) 500 MG capsule, Take 2 capsules (1,000 mg total) by mouth 2 (two) times daily., Disp: 120 capsule, Rfl: 4   cetirizine (ZYRTEC) 10 MG tablet, Take 10 mg by mouth daily as needed for allergies., Disp: , Rfl:    cholecalciferol (VITAMIN D3) 25 MCG (1000 UNIT) tablet, Take 1,000 Units by mouth daily., Disp: , Rfl:  Cyanocobalamin (B-12 COMPLIANCE INJECTION) 1000 MCG/ML KIT, Inject 1 mL as directed every 30  (thirty) days., Disp: 3 kit, Rfl: 4   cyclobenzaprine (FLEXERIL) 5 MG tablet, Take 5 mg by mouth 3 (three) times daily as needed for muscle spasms., Disp: , Rfl:    Iron, Ferrous Sulfate, 325 (65 Fe) MG TABS, Take 325 mg by mouth every other day., Disp: 45 tablet, Rfl: 1   lidocaine (XYLOCAINE) 5 % ointment, Apply 1 Application topically 3 (three) times daily as needed., Disp: 50 g, Rfl: 0   Multiple Vitamins-Minerals (PRESERVISION AREDS PO), Take 1 tablet by mouth 2 (two) times daily., Disp: , Rfl:    pravastatin (PRAVACHOL) 10 MG tablet, Take 10 mg by mouth every evening., Disp: , Rfl:    sertraline (ZOLOFT) 100 MG tablet, Take 1 tablet (100 mg total) by mouth daily., Disp: 90 tablet, Rfl: 3   valsartan (DIOVAN) 80 MG tablet, Take 1 tablet (80 mg total) by mouth daily., Disp: 90 tablet, Rfl: 3   Allergies: Allergies  Allergen Reactions   Celebrex [Celecoxib] Other (See Comments)    Abdominal pain; history of upper GI bleed    Claritin [Loratadine] Hives and Swelling    blisters   Penicillins Hives   Percocet [Oxycodone-Acetaminophen] Other (See Comments)    Severe constipation    REVIEW OF SYSTEMS:   Review of Systems  Respiratory:  Positive for shortness of breath.   Neurological:  Positive for headaches.  All other systems reviewed and are negative.    VITALS:   Blood pressure 135/62, pulse 82, temperature 98 F (36.7 C), temperature source Oral, resp. rate 19, height 5\' 3"  (1.6 m), weight 192 lb 12.8 oz (87.5 kg), SpO2 95%.  Wt Readings from Last 3 Encounters:  12/05/22 192 lb 12.8 oz (87.5 kg)  11/16/22 202 lb 6.4 oz (91.8 kg)  11/09/22 202 lb (91.6 kg)    Body mass index is 34.15 kg/m.   PHYSICAL EXAM:   Physical Exam Vitals reviewed.  Constitutional:      Appearance: Normal appearance.  Cardiovascular:     Rate and Rhythm: Normal rate and regular rhythm.     Pulses: Normal pulses.     Heart sounds: Normal heart sounds.  Pulmonary:     Effort: Pulmonary  effort is normal.     Breath sounds: Normal breath sounds.  Abdominal:     General: There is no distension.     Palpations: Abdomen is soft.  Neurological:     Mental Status: She is alert.  Psychiatric:        Mood and Affect: Mood normal.        Behavior: Behavior normal.     LABS:      Latest Ref Rng & Units 09/21/2022    9:40 AM 09/20/2022   12:16 PM 09/19/2022   12:48 AM  CBC  WBC 4.0 - 10.5 K/uL 6.7   13.6   Hemoglobin 12.0 - 15.0 g/dL 7.9  8.3  7.5   Hematocrit 36.0 - 46.0 % 27.0  28.5  26.1   Platelets 150 - 400 K/uL 429   435       Latest Ref Rng & Units 09/21/2022    9:40 AM 09/20/2022    3:15 AM 09/19/2022   12:48 AM  CMP  Glucose 70 - 99 mg/dL 161  54  096   BUN 8 - 23 mg/dL 34  50  45   Creatinine 0.44 - 1.00 mg/dL 0.45  4.09  8.11  Sodium 135 - 145 mmol/L 135  136  131   Potassium 3.5 - 5.1 mmol/L 4.9  5.1  5.3   Chloride 98 - 111 mmol/L 104  106  96   CO2 22 - 32 mmol/L 25  24  23    Calcium 8.9 - 10.3 mg/dL 8.2  8.2  8.9      No results found for: "CEA1", "CEA" / No results found for: "CEA1", "CEA" No results found for: "PSA1" No results found for: "JOA416" No results found for: "CAN125"  No results found for: "TOTALPROTELP", "ALBUMINELP", "A1GS", "A2GS", "BETS", "BETA2SER", "GAMS", "MSPIKE", "SPEI" Lab Results  Component Value Date   TIBC 344 11/01/2022   FERRITIN 20 11/01/2022   IRONPCTSAT 6 (L) 11/01/2022   No results found for: "LDH"   STUDIES:   No results found.

## 2022-12-05 NOTE — Patient Instructions (Signed)
You were seen and examined today by Dr. Ellin Saba. Dr. Ellin Saba is a hematologist, meaning that he specializes in blood abnormalities. Dr. Ellin Saba discussed your past medical history, family history of cancers/blood conditions and the events that led to you being here today.  You were referred to Dr. Ellin Saba due to iron deficiency anemia.  We will arrange for you to have an IV iron infusion in the clinic.   We will see you back in 6-8 weeks. We will repeat lab work prior to this visit.   Follow-up as scheduled.

## 2022-12-06 ENCOUNTER — Other Ambulatory Visit (INDEPENDENT_AMBULATORY_CARE_PROVIDER_SITE_OTHER): Payer: PPO

## 2022-12-06 DIAGNOSIS — D509 Iron deficiency anemia, unspecified: Secondary | ICD-10-CM | POA: Diagnosis not present

## 2022-12-06 LAB — IFOBT (OCCULT BLOOD): IFOBT: POSITIVE

## 2022-12-11 ENCOUNTER — Other Ambulatory Visit: Payer: Self-pay

## 2022-12-11 ENCOUNTER — Encounter: Payer: Self-pay | Admitting: Internal Medicine

## 2022-12-11 ENCOUNTER — Ambulatory Visit: Payer: PPO | Admitting: Internal Medicine

## 2022-12-11 VITALS — BP 118/73 | HR 87 | Temp 98.4°F | Ht 63.0 in | Wt 207.0 lb

## 2022-12-11 DIAGNOSIS — B9561 Methicillin susceptible Staphylococcus aureus infection as the cause of diseases classified elsewhere: Secondary | ICD-10-CM

## 2022-12-11 DIAGNOSIS — T8454XD Infection and inflammatory reaction due to internal left knee prosthesis, subsequent encounter: Secondary | ICD-10-CM

## 2022-12-12 ENCOUNTER — Other Ambulatory Visit: Payer: Self-pay

## 2022-12-12 DIAGNOSIS — R195 Other fecal abnormalities: Secondary | ICD-10-CM

## 2022-12-12 LAB — SEDIMENTATION RATE: Sed Rate: 2 mm/h (ref 0–30)

## 2022-12-12 LAB — C-REACTIVE PROTEIN: CRP: <3 mg/L (ref <8.0)

## 2022-12-13 ENCOUNTER — Ambulatory Visit: Payer: PPO

## 2022-12-14 ENCOUNTER — Inpatient Hospital Stay: Payer: PPO | Attending: Hematology

## 2022-12-14 VITALS — BP 129/81 | HR 77 | Temp 97.9°F | Resp 19

## 2022-12-14 DIAGNOSIS — D509 Iron deficiency anemia, unspecified: Secondary | ICD-10-CM | POA: Insufficient documentation

## 2022-12-14 DIAGNOSIS — D631 Anemia in chronic kidney disease: Secondary | ICD-10-CM | POA: Insufficient documentation

## 2022-12-14 DIAGNOSIS — N189 Chronic kidney disease, unspecified: Secondary | ICD-10-CM | POA: Insufficient documentation

## 2022-12-14 MED ORDER — SODIUM CHLORIDE 0.9 % IV SOLN: INTRAVENOUS | Status: DC

## 2022-12-14 MED ORDER — SODIUM CHLORIDE 0.9 % IV SOLN
1000.0000 mg | Freq: Once | INTRAVENOUS | Status: AC
  Administered 2022-12-14: 1000 mg via INTRAVENOUS
  Filled 2022-12-14: qty 1000

## 2022-12-14 NOTE — Progress Notes (Signed)
Patient tolerated iron infusion with no complaints voiced.  Peripheral IV site clean and dry with good blood return noted before and after infusion.  Band aid applied.  VSS with discharge and left in satisfactory condition with no s/s of distress noted.   

## 2022-12-14 NOTE — Patient Instructions (Signed)
[facility]  Discharge Instructions: Thank you for choosing [facility] to provide your oncology and hematology care.  If you have a lab appointment with the Cancer Center - please note that after April 8th, 2024, all labs will be drawn in the cancer center.  You do not have to check in or register with the main entrance as you have in the past but will complete your check-in in the cancer center.  Wear comfortable clothing and clothing appropriate for easy access to any Portacath or PICC line.   We strive to give you quality time with your provider. You may need to reschedule your appointment if you arrive late (15 or more minutes).  Arriving late affects you and other patients whose appointments are after yours.  Also, if you miss three or more appointments without notifying the office, you may be dismissed from the clinic at the provider's discretion.      For prescription refill requests, have your pharmacy contact our office and allow 72 hours for refills to b   To help prevent nausea and vomiting after your treatment, we encourage you to take your nausea medication as directed.  BELOW ARE SYMPTOMS THAT SHOULD BE REPORTED IMMEDIATELY: *FEVER GREATER THAN 100.4 F (38 C) OR HIGHER *CHILLS OR SWEATING *NAUSEA AND VOMITING THAT IS NOT CONTROLLED WITH YOUR NAUSEA MEDICATION *UNUSUAL SHORTNESS OF BREATH *UNUSUAL BRUISING OR BLEEDING *URINARY PROBLEMS (pain or burning when urinating, or frequent urination) *BOWEL PROBLEMS (unusual diarrhea, constipation, pain near the anus) TENDERNESS IN MOUTH AND THROAT WITH OR WITHOUT PRESENCE OF ULCERS (sore throat, sores in mouth, or a toothache) UNUSUAL RASH, SWELLING OR PAIN  UNUSUAL VAGINAL DISCHARGE OR ITCHING   Items with * indicate a potential emergency and should be followed up as soon as possible or go to the Emergency Department if any problems should occur.  Please show the CHEMOTHERAPY ALERT CARD or  IMMUNOTHERAPY ALERT CARD at check-in to the Emergency Department and triage nurse.  Should you have questions after your visit or need to cancel or reschedule your appointment, please contact [facility] [phone]  and follow the prompts.  Office hours are 8:00 a.m. to 4:30 p.m. Monday - Friday. Please note that voicemails left after 4:00 p.m. may not be returned until the following business day.  We are closed weekends and major holidays. You have access to a nurse at all times for urgent questions. Please call the main number to the clinic [phone] and follow the prompts.  For any non-urgent questions, you may also contact your provider using MyChart. We now offer e-Visits for anyone 87 and older to request care online for non-urgent symptoms. For details visit [email or web address].   Also download the MyChart app! Go to the app store, search "MyChart", open the app, select [facility], and log in with your MyChart username and password.

## 2022-12-17 ENCOUNTER — Ambulatory Visit (INDEPENDENT_AMBULATORY_CARE_PROVIDER_SITE_OTHER): Payer: PPO | Admitting: Family Medicine

## 2022-12-17 VITALS — BP 139/76 | HR 74 | Temp 97.3°F | Ht 63.0 in | Wt 209.0 lb

## 2022-12-17 DIAGNOSIS — I10 Essential (primary) hypertension: Secondary | ICD-10-CM

## 2022-12-17 DIAGNOSIS — L304 Erythema intertrigo: Secondary | ICD-10-CM | POA: Insufficient documentation

## 2022-12-17 DIAGNOSIS — E785 Hyperlipidemia, unspecified: Secondary | ICD-10-CM | POA: Diagnosis not present

## 2022-12-17 MED ORDER — FLUCONAZOLE 150 MG PO TABS
150.0000 mg | ORAL_TABLET | ORAL | 0 refills | Status: DC
Start: 2022-12-17 — End: 2023-11-13

## 2022-12-17 MED ORDER — CARVEDILOL 3.125 MG PO TABS: 3.1250 mg | ORAL_TABLET | Freq: Two times a day (BID) | ORAL | 1 refills | Status: DC

## 2022-12-17 MED ORDER — NYSTATIN 100000 UNIT/GM EX POWD: 1.0000 | Freq: Two times a day (BID) | CUTANEOUS | 1 refills | Status: DC

## 2022-12-17 MED ORDER — PRAVASTATIN SODIUM 10 MG PO TABS: 10.0000 mg | ORAL_TABLET | Freq: Every evening | ORAL | 1 refills | Status: DC

## 2022-12-17 NOTE — Progress Notes (Signed)
Subjective:  Patient ID: Shelly [name], female    DOB: 1940/05/07  Age: 82 y.o. MRN: [id number]  CC:   Chief Complaint  Patient presents with   Diabetes   Hypertension    Leg swelling    HPI:  82 year old female presents for follow-up.  Patient recently had positive fecal occult blood.  Referral to GI exam.  Following with hematology.  Has received IV iron.  Patient reports that she is having ongoing rash underneath her breasts and underneath her pannus.  Also affects the upper thighs.  It is very red and irritated.  It burns.  Patient remains on antibiotic therapy per ID given infection of prosthetic left knee joint.  Hypertension is stable.  Needs refill on carvedilol.  Lipids at goal on Pravastatin. Needs refill.    Patient Active Problem List   Diagnosis Date Noted   Intertrigo 12/17/2022   Iron deficiency anemia 11/18/2022   Status post revision of total replacement of left knee 09/17/2022   Infection of prosthetic left knee joint (HCC) 09/16/2022   Chronic pain of right wrist 07/18/2022   Anxiety and depression 06/04/2022   Memory changes 06/04/2022   B12 deficiency 04/17/2022   CKD (chronic kidney disease) stage 3, GFR 30-59 ml/min (HCC) 04/16/2022   Legally blind 04/16/2022   Malignant neoplasm of upper-outer quadrant of left breast in female, estrogen receptor positive (HCC) 09/18/2017   Hyperlipidemia    Diabetes mellitus (HCC)    PUD (peptic ulcer disease)    Essential hypertension 12/07/2008    Social Hx   Social History   Socioeconomic History   Marital status: Divorced    Spouse name: Not on file   Number of children: 2   Years of education: Not on file   Highest education level: Some college, no degree  Occupational History   Not on file  Tobacco Use   Smoking status: Former    Current packs/day: 0.00    Average packs/day: 2.5 packs/day for 27.0 years (67.5 ttl pk-yrs)    Types: Cigarettes    Start date: 02/12/1954    Quit date:  02/12/1981    Years since quitting: 41.8   Smokeless tobacco: Never  Vaping Use   Vaping status: Never Used  Substance and Sexual Activity   Alcohol use: No   Drug use: No   Sexual activity: Not Currently  Other Topics Concern   Not on file  Social History Narrative   Lives locally. Works part time at the [employer].    Social Determinants of Health   Financial Resource Strain: Low Risk  (12/17/2022)   Overall Financial Resource Strain (CARDIA)    Difficulty of Paying Living Expenses: Not very hard  Food Insecurity: No Food Insecurity (12/17/2022)   Hunger Vital Sign    Worried About Running Out of Food in the Last Year: Never true    Ran Out of Food in the Last Year: Never true  Transportation Needs: No Transportation Needs (12/17/2022)   PRAPARE - Administrator, Civil Service (Medical): No    Lack of Transportation (Non-Medical): No  Physical Activity: Unknown (12/17/2022)   Exercise Vital Sign    Days of Exercise per Week: Patient declined    Minutes of Exercise per Session: 0 min  Recent Concern: Physical Activity - Inactive (11/09/2022)   Exercise Vital Sign    Days of Exercise per Week: 0 days    Minutes of Exercise per Session: 0 min  Stress:  No Stress Concern Present (12/17/2022)   [employer] of Occupational Health - Occupational Stress Questionnaire    Feeling of Stress : Only a little  Social Connections: Moderately Isolated (12/17/2022)   Social Connection and Isolation Panel [NHANES]    Frequency of Communication with Friends and Family: More than three times a week    Frequency of Social Gatherings with Friends and Family: Three times a week    Attends Religious Services: 1 to 4 times per year    Active Member of Clubs or Organizations: No    Attends Banker Meetings: Never    Marital Status: Divorced    Review of Systems Per HPI  Objective:  BP 139/76   Pulse 74   Temp (!) 97.3 F (36.3 C)   Ht 5'  3" (1.6 m)   Wt 209 lb (94.8 kg)   SpO2 96%   BMI 37.02 kg/m      12/17/2022    3:14 PM 12/14/2022    2:37 PM 12/14/2022    1:27 PM  BP/Weight  Systolic BP 139 129 138  Diastolic BP 76 81 79  Wt. (Lbs) 209    BMI 37.02 kg/m2      Physical Exam Vitals and nursing note reviewed.  Constitutional:      Appearance: Normal appearance. She is obese.  HENT:     Head: Normocephalic and atraumatic.  Eyes:     General:        Right eye: No discharge.        Left eye: No discharge.     Conjunctiva/sclera: Conjunctivae normal.  Cardiovascular:     Rate and Rhythm: Normal rate and regular rhythm.  Pulmonary:     Effort: Pulmonary effort is normal.     Breath sounds: Normal breath sounds. No wheezing or rales.  Abdominal:     General: There is no distension.     Palpations: Abdomen is soft.     Tenderness: There is no abdominal tenderness.  Skin:    Comments: Beefy, erythematous rash underneath the abdominal pannus.   Neurological:     Mental Status: She is alert.  Psychiatric:        Mood and Affect: Mood normal.        Behavior: Behavior normal.     Lab Results  Component Value Date   WBC 6.7 09/21/2022   HGB 7.9 (L) 09/21/2022   HCT 27.0 (L) 09/21/2022   PLT 429 (H) 09/21/2022   GLUCOSE 178 (H) 09/21/2022   CHOL 156 04/16/2022   TRIG 330 (H) 04/16/2022   HDL 47 04/16/2022   LDLCALC 58 04/16/2022   ALT 11 04/16/2022   AST 16 04/16/2022   NA 135 09/21/2022   K 4.9 09/21/2022   CL 104 09/21/2022   CREATININE 1.25 (H) 09/21/2022   BUN 34 (H) 09/21/2022   CO2 25 09/21/2022   INR 0.98 06/25/2012   HGBA1C 5.5 11/16/2022     Assessment & Plan:   Problem List Items Addressed This Visit       Cardiovascular and Mediastinum   Essential hypertension - Primary    Stable.  Continue current medications.  Carvedilol refilled.      Relevant Medications   pravastatin (PRAVACHOL) 10 MG tablet   carvedilol (COREG) 3.125 MG tablet     Musculoskeletal and Integument    Intertrigo    Treating with nystatin powder and oral Diflucan.        Other   Hyperlipidemia  Stable.  Pravastatin refilled.      Relevant Medications   pravastatin (PRAVACHOL) 10 MG tablet   carvedilol (COREG) 3.125 MG tablet    Meds ordered this encounter  Medications   pravastatin (PRAVACHOL) 10 MG tablet    Sig: Take 1 tablet (10 mg total) by mouth every evening. Take 10 mg by mouth every evening.    Dispense:  90 tablet    Refill:  1   carvedilol (COREG) 3.125 MG tablet    Sig: Take 1 tablet (3.125 mg total) by mouth 2 (two) times daily. Dose reduction per patientTake 3.125 mg by mouth 2 (two) times daily. Dose reduction per patient    Dispense:  180 tablet    Refill:  1   nystatin (MYCOSTATIN/NYSTOP) powder    Sig: Apply 1 Application topically 2 (two) times daily.    Dispense:  60 g    Refill:  1   fluconazole (DIFLUCAN) 150 MG tablet    Sig: Take 1 tablet (150 mg total) by mouth once a week.    Dispense:  4 tablet    Refill:  0    Follow-up:  Return in about 3 months (around 03/19/2023) for Follow up Chronic medical issues.  Everlene Other DO [facility]

## 2022-12-17 NOTE — Patient Instructions (Signed)
Medication as prescribed.  Healthy diet.  Follow up in 3 months.

## 2022-12-17 NOTE — Assessment & Plan Note (Signed)
Stable.  Pravastatin refilled.

## 2022-12-17 NOTE — Assessment & Plan Note (Signed)
Stable.  Continue current medications.  Carvedilol refilled.

## 2022-12-17 NOTE — Assessment & Plan Note (Signed)
Treating with nystatin powder and oral Diflucan.

## 2022-12-18 ENCOUNTER — Encounter: Payer: Self-pay | Admitting: Orthopaedic Surgery

## 2022-12-18 ENCOUNTER — Ambulatory Visit (INDEPENDENT_AMBULATORY_CARE_PROVIDER_SITE_OTHER): Payer: Self-pay

## 2022-12-18 ENCOUNTER — Ambulatory Visit (INDEPENDENT_AMBULATORY_CARE_PROVIDER_SITE_OTHER): Payer: PPO | Admitting: Orthopaedic Surgery

## 2022-12-18 DIAGNOSIS — Z96652 Presence of left artificial knee joint: Secondary | ICD-10-CM

## 2022-12-18 NOTE — Progress Notes (Signed)
Post-Op Visit Note   Patient: Shelly [name]           Date of Birth: [birth year]           MRN: [id number] Visit Date: 12/18/2022 PCP: Tommie Sams, DO   Assessment & Plan:  Chief Complaint:  Chief Complaint  Patient presents with   Left Knee - Follow-up    Revision 09/17/2022   Visit Diagnoses:  1. Status post revision of total replacement of left knee     Plan: Patient is 3 months status post resection arthroplasty.  She is doing well overall.  She has completed IV antibiotics and is now on oral cefadroxil.  She recently saw Dr. Ilsa Iha who plans to keep her on this until February.  She is not reporting any problems with the knee.  She is not reporting any constitutional symptoms.  Exam of the left knee shows fully healed surgical scar.  Painless range of motion.  Collaterals are stable.  Ambulates with a rollator.  X-rays are stable.  Continue oral antibiotics.  Her inflammatory markers have normalized.  Recheck in 3 months with two-view x-rays of the left knee.  Follow-Up Instructions: Return in about 3 months (around 03/20/2023).   Orders:  Orders Placed This Encounter  Procedures   XR Knee 1-2 Views Left   No orders of the defined types were placed in this encounter.   Imaging: XR Knee 1-2 Views Left  Result Date: 12/18/2022 X-rays of the left knee demonstrate no acute abnormalities to the spacer.   PMFS History: Patient Active Problem List   Diagnosis Date Noted   Intertrigo 12/17/2022   Iron deficiency anemia 11/18/2022   Status post revision of total replacement of left knee 09/17/2022   Infection of prosthetic left knee joint (HCC) 09/16/2022   Chronic pain of right wrist 07/18/2022   Anxiety and depression 06/04/2022   Memory changes 06/04/2022   B12 deficiency 04/17/2022   CKD (chronic kidney disease) stage 3, GFR 30-59 ml/min (HCC) 04/16/2022   Legally blind 04/16/2022   Malignant neoplasm of upper-outer quadrant of left breast in female, estrogen  receptor positive (HCC) 09/18/2017   Hyperlipidemia    Diabetes mellitus (HCC)    PUD (peptic ulcer disease)    Essential hypertension 12/07/2008   Past Medical History:  Diagnosis Date   Arthritis    "qwhere" (07/01/2012)   Asthma    normal chest x-ray in 2010   Basal cell carcinoma of face    Breast cancer (HCC)    Cholelithiasis    Chronic lower back pain    Degenerative joint disease    Right TKA-2010; low back pain; hands and hips also affected   Exertional shortness of breath    "mostly from being too fat" (07/01/2012)   GERD (gastroesophageal reflux disease)    history   H/O hiatal hernia    Heart murmur    small   Hemorrhoid    HTN (hypertension)    Hyperlipidemia    Macular degeneration    "both eyes" (07/01/2012)   PUD (peptic ulcer disease) 2003   Upper GI bleed-gastric ulcer; gastroesophageal reflux disease   Seasonal allergies    Type II diabetes mellitus (HCC)     Family History  Problem Relation Age of Onset   Hodgkin's lymphoma Mother    Cervical cancer Sister        half sister mom's side   Lung cancer Sister    Breast cancer Neg Hx  Past Surgical History:  Procedure Laterality Date   APPENDECTOMY     BREAST LUMPECTOMY Left    BREAST LUMPECTOMY WITH RADIOACTIVE SEED LOCALIZATION Left 10/21/2017   Procedure: BREAST LUMPECTOMY WITH RADIOACTIVE SEED LOCALIZATION;  Surgeon: Claud Kelp, MD;  Location: [facility];  Service: General;  Laterality: Left;   CATARACT EXTRACTION BILATERAL W/ ANTERIOR VITRECTOMY Bilateral    EYE SURGERY Bilateral    "laser in eyes to stop bleeding and injections for macular degeneration" (07/01/2012)   FOOT SURGERY Bilateral    straighten 1st toe with a wedge.   KNEE ARTHROSCOPY Bilateral    SKIN CANCER EXCISION  2013   "face" (07/01/2012)   TOTAL KNEE ARTHROPLASTY Right 2011   TOTAL KNEE ARTHROPLASTY Left 07/01/2012   TOTAL KNEE ARTHROPLASTY Left 07/01/2012   Procedure: LEFT TOTAL KNEE ARTHROPLASTY;   Surgeon: Valeria Batman, MD;  Location[facility];  Service: Orthopedics;  Laterality: Left;  Left Total Knee Arthroplasty   TOTAL KNEE REVISION Left 09/17/2022   Procedure: LEFT KNEE RESECTION ARTHROPLASTY, REVISION TOTAL KNEE ARTHROPLASTY;  Surgeon: Tarry Kos, MD;  Location: [facility] OR;  Service: Orthopedics;  Laterality: Left;   Social History   Occupational History   Not on file  Tobacco Use   Smoking status: Former    Current packs/day: 0.00    Average packs/day: 2.5 packs/day for 27.0 years (67.5 ttl pk-yrs)    Types: Cigarettes    Start date: 02/12/1954    Quit date: 02/12/1981    Years since quitting: 41.8   Smokeless tobacco: Never  Vaping Use   Vaping status: Never Used  Substance and Sexual Activity   Alcohol use: No   Drug use: No   Sexual activity: Not Currently

## 2022-12-19 ENCOUNTER — Ambulatory Visit (INDEPENDENT_AMBULATORY_CARE_PROVIDER_SITE_OTHER): Payer: PPO | Admitting: Gastroenterology

## 2022-12-19 ENCOUNTER — Encounter: Payer: Self-pay | Admitting: Gastroenterology

## 2022-12-19 VITALS — BP 134/83 | HR 75 | Temp 98.5°F | Ht 63.0 in | Wt 207.6 lb

## 2022-12-19 DIAGNOSIS — R195 Other fecal abnormalities: Secondary | ICD-10-CM | POA: Diagnosis not present

## 2022-12-19 DIAGNOSIS — D509 Iron deficiency anemia, unspecified: Secondary | ICD-10-CM | POA: Diagnosis not present

## 2022-12-19 NOTE — Patient Instructions (Signed)
We will be in touch in a few days with further recommendations from Dr. Jena Gauss regarding work up for blood in the stool, anemia.

## 2022-12-19 NOTE — Progress Notes (Signed)
GI Office Note    Referring Provider: Tommie Sams, DO Primary Care Physician:  Tommie Sams, DO  Primary Gastroenterologist: Roetta Sessions, MD   Chief Complaint   Chief Complaint  Patient presents with   New Patient (Initial Visit)    POS hemoccult pos stool     History of Present Illness   Shelly [name] is a 82 y.o. female presenting today at the request of Dr. Adriana Simas for further evaluation of Hemoccult positive stool.  Patient is followed by infectious disease for history of left knee MSSA PJI with revision on August 5 where she has finished IV cefazolin and now on cefadroxil, with plans to continue through the beginning of February at current dose with potential for lower dose for chronic suppression.  Recently seen by hematology December 05, 2022 for IDA with combination of CKD.  Ferritin of 20.  On iron pills for many months.  Plans for parenteral iron, with first infusion November 1.  In March 2024 her hemoglobin was 13.5.  In July, labs by her nephrologist showed hemoglobin 9.3, MCV 76, TIBC 244, iron 12, iron sat 5%.  She had surgery on August 5 for revision of both femoral, tibial, patellar components of the left total knee arthroplasty due to chronic prosthetic joint infection.  Postoperatively her hemoglobin was 9.7.  At time of discharge her hemoglobin was 7.9.  September 2024 her hemoglobin was 8.5, ferritin 20, iron sat 6%, iron 19, TIBC 344.  Recent stool Hemoccult was positive.   Cologuard in 2020 which was negative.  Today: Remote colonoscopy and EGD. Had stomach ulcers over two decades ago, medication related. BM regular. No melena, brbpr. Patient legally blind. Grandson collected stool, it was dark but not black. No abdominal pain. No heartburn, no dysphagia. No n/v. Appetite good. She had had weight loss this year. Eats when she is hungry. States her daughter, Milagros Loll, stays on her to eat regularly. No NSAIDs/ASA. No blood transfusions.  Wt Readings from Last  12 Encounters:  12/19/22 207 lb 9.6 oz (94.2 kg)  12/17/22 209 lb (94.8 kg)  12/11/22 207 lb (93.9 kg)  12/05/22 192 lb 12.8 oz (87.5 kg)  11/16/22 202 lb 6.4 oz (91.8 kg)  11/09/22 202 lb (91.6 kg)  11/01/22 208 lb 3.2 oz (94.4 kg)  09/18/22 202 lb (91.6 kg)  08/01/22 211 lb 6.4 oz (95.9 kg)  07/17/22 214 lb (97.1 kg)  06/04/22 223 lb (101.2 kg)  04/16/22 230 lb (104.3 kg)     Medications   Current Outpatient Medications  Medication Sig Dispense Refill   acetaminophen (TYLENOL) 650 MG CR tablet Take 650 mg by mouth every 8 (eight) hours as needed for pain.     albuterol (VENTOLIN HFA) 108 (90 Base) MCG/ACT inhaler Inhale 2 puffs into the lungs every 6 (six) hours as needed for shortness of breath.     carvedilol (COREG) 3.125 MG tablet Take 1 tablet (3.125 mg total) by mouth 2 (two) times daily. Dose reduction per patientTake 3.125 mg by mouth 2 (two) times daily. Dose reduction per patient 180 tablet 1   cefadroxil (DURICEF) 500 MG capsule Take 2 capsules (1,000 mg total) by mouth 2 (two) times daily. 120 capsule 4   cetirizine (ZYRTEC) 10 MG tablet Take 10 mg by mouth daily as needed for allergies.     cholecalciferol (VITAMIN D3) 25 MCG (1000 UNIT) tablet Take 1,000 Units by mouth daily.     Cyanocobalamin (B-12 COMPLIANCE INJECTION) 1000  MCG/ML KIT Inject 1 mL as directed every 30 (thirty) days. 3 kit 4   cyclobenzaprine (FLEXERIL) 5 MG tablet Take 5 mg by mouth 3 (three) times daily as needed for muscle spasms.     fluconazole (DIFLUCAN) 150 MG tablet Take 1 tablet (150 mg total) by mouth once a week. 4 tablet 0   Iron, Ferrous Sulfate, 325 (65 Fe) MG TABS Take 325 mg by mouth every other day. 45 tablet 1   lidocaine (XYLOCAINE) 5 % ointment Apply 1 Application topically 3 (three) times daily as needed. 50 g 0   Multiple Vitamins-Minerals (PRESERVISION AREDS PO) Take 1 tablet by mouth 2 (two) times daily.     nystatin (MYCOSTATIN/NYSTOP) powder Apply 1 Application topically 2  (two) times daily. 60 g 1   pravastatin (PRAVACHOL) 10 MG tablet Take 1 tablet (10 mg total) by mouth every evening. Take 10 mg by mouth every evening. 90 tablet 1   sertraline (ZOLOFT) 100 MG tablet Take 1 tablet (100 mg total) by mouth daily. 90 tablet 3   valsartan (DIOVAN) 80 MG tablet Take 1 tablet (80 mg total) by mouth daily. 90 tablet 3   No current facility-administered medications for this visit.    Allergies   Allergies as of 12/19/2022 - Review Complete 12/19/2022  Allergen Reaction Noted   Celebrex [celecoxib] Other (See Comments) 08/22/2010   Claritin [loratadine] Hives and Swelling 06/20/2012   Penicillins Hives 12/07/2008   Percocet [oxycodone-acetaminophen] Other (See Comments) 10/21/2017    Past Medical History   Past Medical History:  Diagnosis Date   Arthritis    "qwhere" (07/01/2012)   Asthma    normal chest x-ray in 2010   Basal cell carcinoma of face    Breast cancer (HCC)    Cholelithiasis    Chronic lower back pain    Degenerative joint disease    Right TKA-2010; low back pain; hands and hips also affected   Exertional shortness of breath    "mostly from being too fat" (07/01/2012)   GERD (gastroesophageal reflux disease)    history   H/O hiatal hernia    Heart murmur    small   Hemorrhoid    HTN (hypertension)    Hyperlipidemia    Macular degeneration    "both eyes" (07/01/2012)   PUD (peptic ulcer disease) 2003   Upper GI bleed-gastric ulcer; gastroesophageal reflux disease   Seasonal allergies    Type II diabetes mellitus (HCC)     Past Surgical History   Past Surgical History:  Procedure Laterality Date   APPENDECTOMY     BREAST LUMPECTOMY Left    BREAST LUMPECTOMY WITH RADIOACTIVE SEED LOCALIZATION Left 10/21/2017   Procedure: BREAST LUMPECTOMY WITH RADIOACTIVE SEED LOCALIZATION;  Surgeon: Claud Kelp, MD;  Location: [facility];  Service: General;  Laterality: Left;   CATARACT EXTRACTION BILATERAL W/ ANTERIOR  VITRECTOMY Bilateral    EYE SURGERY Bilateral    "laser in eyes to stop bleeding and injections for macular degeneration" (07/01/2012)   FOOT SURGERY Bilateral    straighten 1st toe with a wedge.   KNEE ARTHROSCOPY Bilateral    SKIN CANCER EXCISION  2013   "face" (07/01/2012)   TOTAL KNEE ARTHROPLASTY Right 2011   TOTAL KNEE ARTHROPLASTY Left 07/01/2012   TOTAL KNEE ARTHROPLASTY Left 07/01/2012   Procedure: LEFT TOTAL KNEE ARTHROPLASTY;  Surgeon: Valeria Batman, MD;  Location: [facility] OR;  Service: Orthopedics;  Laterality: Left;  Left Total Knee Arthroplasty   TOTAL KNEE REVISION  Left 09/17/2022   Procedure: LEFT KNEE RESECTION ARTHROPLASTY, REVISION TOTAL KNEE ARTHROPLASTY;  Surgeon: Tarry Kos, MD;  Location: [facility] OR;  Service: Orthopedics;  Laterality: Left;    Past Family History   Family History  Problem Relation Age of Onset   Hodgkin's lymphoma Mother    Cervical cancer Sister        half sister mom's side   Lung cancer Sister    Colon cancer Sister    Breast cancer Neg Hx     Past Social History   Social History   Socioeconomic History   Marital status: Divorced    Spouse name: Not on file   Number of children: 2   Years of education: Not on file   Highest education level: Some college, no degree  Occupational History   Not on file  Tobacco Use   Smoking status: Former    Current packs/day: 0.00    Average packs/day: 2.5 packs/day for 27.0 years (67.5 ttl pk-yrs)    Types: Cigarettes    Start date: 02/12/1954    Quit date: 02/12/1981    Years since quitting: 41.8   Smokeless tobacco: Never  Vaping Use   Vaping status: Never Used  Substance and Sexual Activity   Alcohol use: No   Drug use: No   Sexual activity: Not Currently  Other Topics Concern   Not on file  Social History Narrative   Lives locally. Works part time at the [employer].    Social Determinants of Health   Financial Resource Strain: Low Risk  (12/17/2022)   Overall  Financial Resource Strain (CARDIA)    Difficulty of Paying Living Expenses: Not very hard  Food Insecurity: No Food Insecurity (12/17/2022)   Hunger Vital Sign    Worried About Running Out of Food in the Last Year: Never true    Ran Out of Food in the Last Year: Never true  Transportation Needs: No Transportation Needs (12/17/2022)   PRAPARE - Administrator, Civil Service (Medical): No    Lack of Transportation (Non-Medical): No  Physical Activity: Unknown (12/17/2022)   Exercise Vital Sign    Days of Exercise per Week: Patient declined    Minutes of Exercise per Session: 0 min  Recent Concern: Physical Activity - Inactive (11/09/2022)   Exercise Vital Sign    Days of Exercise per Week: 0 days    Minutes of Exercise per Session: 0 min  Stress: No Stress Concern Present (12/17/2022)   [employer] of Occupational Health - Occupational Stress Questionnaire    Feeling of Stress : Only a little  Social Connections: Moderately Isolated (12/17/2022)   Social Connection and Isolation Panel [NHANES]    Frequency of Communication with Friends and Family: More than three times a week    Frequency of Social Gatherings with Friends and Family: Three times a week    Attends Religious Services: 1 to 4 times per year    Active Member of Clubs or Organizations: No    Attends Banker Meetings: Never    Marital Status: Divorced  Catering manager Violence: Not At Risk (11/09/2022)   Humiliation, Afraid, Rape, and Kick questionnaire    Fear of Current or Ex-Partner: No    Emotionally Abused: No    Physically Abused: No    Sexually Abused: No    Review of Systems   General: Negative for anorexia, weight loss, fever, chills, fatigue, +weakness. Eyes: Negative for vision changes. Legally  blind. ENT: Negative for hoarseness, difficulty swallowing , nasal congestion. CV: Negative for chest pain, angina, palpitations, dyspnea on exertion, peripheral edema.  Respiratory:  Negative for dyspnea at rest, dyspnea on exertion, cough, sputum, wheezing.  GI: See history of present illness. GU:  Negative for dysuria, hematuria, urinary incontinence, urinary frequency, nocturnal urination.  MS: Negative for joint pain, low back pain.  Derm: Negative for rash or itching.  Neuro: Negative for weakness, abnormal sensation, seizure, frequent headaches, memory loss,  confusion.  Psych: Negative for anxiety, depression, suicidal ideation, hallucinations.  Endo: Negative for unusual weight change.  Heme: Negative for bruising or bleeding. Allergy: Negative for rash or hives.  Physical Exam   BP 134/83   Pulse 75   Temp 98.5 F (36.9 C)   Ht 5\' 3"  (1.6 m)   Wt 207 lb 9.6 oz (94.2 kg)   BMI 36.77 kg/m    General:elderly female in no acute distress.  Head: Normocephalic, atraumatic.   Eyes: Conjunctiva pale, no icterus. Mouth: Oropharyngeal mucosa moist and pink   Neck: Supple without thyromegaly, masses, or lymphadenopathy.  Lungs: Clear to auscultation bilaterally.  Heart: Regular rate and rhythm, no murmurs rubs or gallops.  Abdomen: Bowel sounds are normal, nontender, nondistended, no hepatosplenomegaly or masses,  no abdominal bruits or hernia, no rebound or guarding.   Rectal: not performed Extremities: 2+ lower extremity edema. No clubbing or deformities.  Neuro: Alert and oriented x 4 , grossly normal neurologically.  Skin: Warm and dry, no rash or jaundice.   Psych: Alert and cooperative, normal mood and affect.  Labs   Lab Results  Component Value Date   NA 135 09/21/2022   CL 104 09/21/2022   K 4.9 09/21/2022   CO2 25 09/21/2022   BUN 34 (H) 09/21/2022   CREATININE 1.25 (H) 09/21/2022   GFRNONAA 43 (L) 09/21/2022   CALCIUM 8.2 (L) 09/21/2022   ALBUMIN 4.3 04/16/2022   GLUCOSE 178 (H) 09/21/2022   Lab Results  Component Value Date   ALT 11 04/16/2022   AST 16 04/16/2022   ALKPHOS 140 (H) 04/16/2022   BILITOT 0.2 04/16/2022   Lab  Results  Component Value Date   WBC 6.7 09/21/2022   HGB 7.9 (L) 09/21/2022   HCT 27.0 (L) 09/21/2022   MCV 74.8 (L) 09/21/2022   PLT 429 (H) 09/21/2022   Lab Results  Component Value Date   IRON 19 (L) 11/01/2022   TIBC 344 11/01/2022   FERRITIN 20 11/01/2022   Lab Results  Component Value Date   VITAMINB12 478 04/16/2022   No results found for: "FOLATE" Lab Results  Component Value Date   HGBA1C 5.5 11/16/2022   Labs from October 26, 2022: Hemoglobin 8.5, hematocrit 29.6, MCV 78.7 Imaging Studies   XR Knee 1-2 Views Left  Result Date: 12/18/2022 X-rays of the left knee demonstrate no acute abnormalities to the spacer.   Assessment/Plan:   Microcytic anemia:  -Hgb normal 04/2022, in July she was noted to have microcytic anemia with labs consistent with IDA.  Some decline postoperatively as outlined above.  -Stool hemoccult positive, no overt GI bleeding -received one iron infusion with additional infusions planned -remote colonoscopy. Cologuard four years ago -For iron deficiency anemia would offer colonoscopy with possible upper endoscopy for further evaluation.  To discuss timing with Dr. Jena Gauss given recent surgical intervention for infected knee prosthesis. We will reach out to ID as well. -patient requesting small volume prep if colonoscopy pursued.  Leanna Battles. Melvyn Neth, MHS, PA-C [facility]

## 2022-12-20 ENCOUNTER — Encounter: Payer: Self-pay | Admitting: Gastroenterology

## 2023-01-02 ENCOUNTER — Telehealth: Payer: Self-pay | Admitting: Gastroenterology

## 2023-01-02 NOTE — Telephone Encounter (Signed)
Pt's daughter was made aware and is ready to move forward with scheduling.

## 2023-01-02 NOTE — Telephone Encounter (Signed)
Will schedule once we get providers schedule for January

## 2023-01-02 NOTE — Telephone Encounter (Signed)
Spoke with infectious disease and Dr. Jena Gauss.  OK to proceed with colonoscopy and possible EGD for IDA, heme positive stool. ASA 3. She will need to hold iron 7 days before.  She is requesting clenpiq    Would recommend updating CBC before procedures. She is set for labs 12/13 with oncology so if procedure after that we should be fine with those labs.

## 2023-01-07 DIAGNOSIS — F32A Depression, unspecified: Secondary | ICD-10-CM

## 2023-01-07 DIAGNOSIS — T8454XA Infection and inflammatory reaction due to internal left knee prosthesis, initial encounter: Secondary | ICD-10-CM

## 2023-01-07 DIAGNOSIS — H548 Legal blindness, as defined in USA: Secondary | ICD-10-CM

## 2023-01-07 DIAGNOSIS — E785 Hyperlipidemia, unspecified: Secondary | ICD-10-CM

## 2023-01-07 DIAGNOSIS — E1121 Type 2 diabetes mellitus with diabetic nephropathy: Secondary | ICD-10-CM

## 2023-01-07 DIAGNOSIS — Z6835 Body mass index (BMI) 35.0-35.9, adult: Secondary | ICD-10-CM

## 2023-01-07 DIAGNOSIS — I10 Essential (primary) hypertension: Secondary | ICD-10-CM

## 2023-01-07 DIAGNOSIS — E1139 Type 2 diabetes mellitus with other diabetic ophthalmic complication: Secondary | ICD-10-CM

## 2023-01-07 DIAGNOSIS — J45909 Unspecified asthma, uncomplicated: Secondary | ICD-10-CM

## 2023-01-07 DIAGNOSIS — E11319 Type 2 diabetes mellitus with unspecified diabetic retinopathy without macular edema: Secondary | ICD-10-CM

## 2023-01-07 DIAGNOSIS — E1151 Type 2 diabetes mellitus with diabetic peripheral angiopathy without gangrene: Secondary | ICD-10-CM

## 2023-01-15 ENCOUNTER — Encounter: Payer: Self-pay | Admitting: *Deleted

## 2023-01-15 MED ORDER — CLENPIQ 10-3.5-12 MG-GM -GM/175ML PO SOLN: 1.0000 | ORAL | 0 refills | Status: DC

## 2023-01-15 NOTE — Addendum Note (Signed)
Addended by: Armstead Peaks on: 01/15/2023 09:51 AM   Modules accepted: Orders

## 2023-01-15 NOTE — Telephone Encounter (Signed)
Called and spoke with pt daughter. She is scheduled for TCS/EGD with Dr. Jena Gauss ASA 3 1/8. Aware will send instructions/pre-op via mychart as patient will lose instructions. Rx for prep sent to pharmacy.

## 2023-01-18 ENCOUNTER — Ambulatory Visit: Payer: PPO

## 2023-01-18 ENCOUNTER — Ambulatory Visit: Payer: PPO | Attending: Cardiology | Admitting: Cardiology

## 2023-01-18 ENCOUNTER — Encounter: Payer: Self-pay | Admitting: Cardiology

## 2023-01-18 VITALS — BP 158/90 | HR 87 | Ht 63.0 in | Wt 213.0 lb

## 2023-01-18 DIAGNOSIS — I10 Essential (primary) hypertension: Secondary | ICD-10-CM | POA: Diagnosis not present

## 2023-01-18 DIAGNOSIS — E782 Mixed hyperlipidemia: Secondary | ICD-10-CM | POA: Diagnosis not present

## 2023-01-18 DIAGNOSIS — R0609 Other forms of dyspnea: Secondary | ICD-10-CM

## 2023-01-18 NOTE — Progress Notes (Signed)
Clinical Summary Shelly [name] is a 82 y.o.female seen today for follow up of the following medical problems.      1. HTN - she is compliant with meds - multiple recent provider visits 130s/80s despite being elevated today     2. Hyperlipidemia    09/2020 TC 160 TG 164 HDL 52 LDL 80  01/2021 TC 295 TG 188 HDL 42 LDL 78  04/2022 TC 156 TG 330 HDL 47 LDL 58. HgbA1c 8.3. Since this time A1c down to 5.5 , has not had repeat lipid panel.  -labs followed by pcp   3. Aortic sclerosis -05/2013 echo mean grade 10, AVA VTI 1.97 03/2021 echo mean grad 7, AVA VTI 2.34. No significant stenosis     4. CKD - followed by Dr Wolfgang Phoenix   5. DOE - some recent symptoms of DOE with short distances - no chest pains - has had some prior LE edema, controlled with lasix mg 20mg  qod - sedentary lifestule due to visual limitations, transporation issues - sister with CHF, mother with CHF.    2013 PFTs: moderate obstruction, diagnosed with asthma.  03/2021 echo: LVEF 60-65%, no WMAs, grade I DD, normal RV, no signficant valve pathology    - breathing is improving, has some chronic cough.    6. Legally blind       SH: oldest daughter works at [employer] Past Medical History:  Diagnosis Date   Arthritis    "qwhere" (07/01/2012)   Asthma    normal chest x-ray in 2010   Basal cell carcinoma of face    Breast cancer (HCC)    Cholelithiasis    Chronic lower back pain    Degenerative joint disease    Right TKA-2010; low back pain; hands and hips also affected   Exertional shortness of breath    "mostly from being too fat" (07/01/2012)   GERD (gastroesophageal reflux disease)    history   H/O hiatal hernia    Heart murmur    small   Hemorrhoid    HTN (hypertension)    Hyperlipidemia    Macular degeneration    "both eyes" (07/01/2012)   PUD (peptic ulcer disease) 2003   Upper GI bleed-gastric ulcer; gastroesophageal reflux disease   Seasonal allergies    Type II  diabetes mellitus (HCC)      Allergies  Allergen Reactions   Celebrex [Celecoxib] Other (See Comments)    Abdominal pain; history of upper GI bleed    Claritin [Loratadine] Hives and Swelling    blisters   Penicillins Hives   Percocet [Oxycodone-Acetaminophen] Other (See Comments)    Severe constipation     Current Outpatient Medications  Medication Sig Dispense Refill   acetaminophen (TYLENOL) 650 MG CR tablet Take 650 mg by mouth every 8 (eight) hours as needed for pain.     albuterol (VENTOLIN HFA) 108 (90 Base) MCG/ACT inhaler Inhale 2 puffs into the lungs every 6 (six) hours as needed for shortness of breath.     carvedilol (COREG) 3.125 MG tablet Take 1 tablet (3.125 mg total) by mouth 2 (two) times daily. Dose reduction per patientTake 3.125 mg by mouth 2 (two) times daily. Dose reduction per patient 180 tablet 1   cefadroxil (DURICEF) 500 MG capsule Take 2 capsules (1,000 mg total) by mouth 2 (two) times daily. 120 capsule 4   cetirizine (ZYRTEC) 10 MG tablet Take 10 mg by mouth daily as needed for allergies.  cholecalciferol (VITAMIN D3) 25 MCG (1000 UNIT) tablet Take 1,000 Units by mouth daily.     Cyanocobalamin (B-12 COMPLIANCE INJECTION) 1000 MCG/ML KIT Inject 1 mL as directed every 30 (thirty) days. 3 kit 4   cyclobenzaprine (FLEXERIL) 5 MG tablet Take 5 mg by mouth 3 (three) times daily as needed for muscle spasms.     fluconazole (DIFLUCAN) 150 MG tablet Take 1 tablet (150 mg total) by mouth once a week. 4 tablet 0   Iron, Ferrous Sulfate, 325 (65 Fe) MG TABS Take 325 mg by mouth every other day. 45 tablet 1   lidocaine (XYLOCAINE) 5 % ointment Apply 1 Application topically 3 (three) times daily as needed. 50 g 0   Multiple Vitamins-Minerals (PRESERVISION AREDS PO) Take 1 tablet by mouth 2 (two) times daily.     nystatin (MYCOSTATIN/NYSTOP) powder Apply 1 Application topically 2 (two) times daily. 60 g 1   pravastatin (PRAVACHOL) 10 MG tablet Take 1 tablet (10 mg  total) by mouth every evening. Take 10 mg by mouth every evening. 90 tablet 1   sertraline (ZOLOFT) 100 MG tablet Take 1 tablet (100 mg total) by mouth daily. 90 tablet 3   Sod Picosulfate-Mag Ox-Cit Acd (CLENPIQ) 10-3.5-12 MG-GM -GM/175ML SOLN Take 1 kit by mouth as directed. 175 mL 0   valsartan (DIOVAN) 80 MG tablet Take 1 tablet (80 mg total) by mouth daily. 90 tablet 3   No current facility-administered medications for this visit.     Past Surgical History:  Procedure Laterality Date   APPENDECTOMY     BREAST LUMPECTOMY Left    BREAST LUMPECTOMY WITH RADIOACTIVE SEED LOCALIZATION Left 10/21/2017   Procedure: BREAST LUMPECTOMY WITH RADIOACTIVE SEED LOCALIZATION;  Surgeon: Claud Kelp, MD;  Location: [facility];  Service: General;  Laterality: Left;   CATARACT EXTRACTION BILATERAL W/ ANTERIOR VITRECTOMY Bilateral    EYE SURGERY Bilateral    "laser in eyes to stop bleeding and injections for macular degeneration" (07/01/2012)   FOOT SURGERY Bilateral    straighten 1st toe with a wedge.   KNEE ARTHROSCOPY Bilateral    SKIN CANCER EXCISION  2013   "face" (07/01/2012)   TOTAL KNEE ARTHROPLASTY Right 2011   TOTAL KNEE ARTHROPLASTY Left 07/01/2012   TOTAL KNEE ARTHROPLASTY Left 07/01/2012   Procedure: LEFT TOTAL KNEE ARTHROPLASTY;  Surgeon: Valeria Batman, MD;  Location: [facility];  Service: Orthopedics;  Laterality: Left;  Left Total Knee Arthroplasty   TOTAL KNEE REVISION Left 09/17/2022   Procedure: LEFT KNEE RESECTION ARTHROPLASTY, REVISION TOTAL KNEE ARTHROPLASTY;  Surgeon: Tarry Kos, MD;  Location: [facility] OR;  Service: Orthopedics;  Laterality: Left;     Allergies  Allergen Reactions   Celebrex [Celecoxib] Other (See Comments)    Abdominal pain; history of upper GI bleed    Claritin [Loratadine] Hives and Swelling    blisters   Penicillins Hives   Percocet [Oxycodone-Acetaminophen] Other (See Comments)    Severe constipation      Family History  Problem  Relation Age of Onset   Hodgkin's lymphoma Mother    Cervical cancer Sister        half sister mom's side   Lung cancer Sister    Colon cancer Sister    Breast cancer Neg Hx      Social History Ms. Duffer reports that she quit smoking about 41 years ago. Her smoking use included cigarettes. She started smoking about 68 years ago. She has a 67.5 pack-year smoking history. She has  never used smokeless tobacco. Ms. Sobh reports no history of alcohol use.   Review of Systems CONSTITUTIONAL: No weight loss, fever, chills, weakness or fatigue.  HEENT: Eyes: No visual loss, blurred vision, double vision or yellow sclerae.No hearing loss, sneezing, congestion, runny nose or sore throat.  SKIN: No rash or itching.  CARDIOVASCULAR: per hpi RESPIRATORY: No shortness of breath, cough or sputum.  GASTROINTESTINAL: No anorexia, nausea, vomiting or diarrhea. No abdominal pain or blood.  GENITOURINARY: No burning on urination, no polyuria NEUROLOGICAL: No headache, dizziness, syncope, paralysis, ataxia, numbness or tingling in the extremities. No change in bowel or bladder control.  MUSCULOSKELETAL: No muscle, back pain, joint pain or stiffness.  LYMPHATICS: No enlarged nodes. No history of splenectomy.  PSYCHIATRIC: No history of depression or anxiety.  ENDOCRINOLOGIC: No reports of sweating, cold or heat intolerance. No polyuria or polydipsia.  Marland Kitchen   Physical Examination Today's Vitals   01/18/23 1259  BP: (!) 158/84  Pulse: 87  SpO2: 94%  Weight: 213 lb (96.6 kg)  Height: 5\' 3"  (1.6 m)   Body mass index is 37.73 kg/m.  Gen: resting comfortably, no acute distress HEENT: no scleral icterus, pupils equal round and reactive, no palptable cervical adenopathy,  CV: RRR, no m/rg, no jvd Resp: Clear to auscultation bilaterally GI: abdomen is soft, non-tender, non-distended, normal bowel sounds, no hepatosplenomegaly MSK: extremities are warm, no edema.  Skin: warm, no rash Neuro:  no  focal deficits Psych: appropriate affect   Diagnostic Studies  05/2012 Carotid US IMPRESSION:  Minimal amount of bilateral intimal wall thickening, not definitely resulting in hemodynamically significant stenosis.     06/2012 Venous LE [place] No evidence of deep vein thrombosis involving the left lower extremity. - No evidence of Baker's cyst on the left. Other specific details can be found in the table(s) above. Prepared and Electronically Authenticated by     05/2013 echo Study Conclusions   - Left ventricle: The cavity size was normal. Wall thickness    was increased in a pattern of mild LVH. Systolic function    was normal. The estimated ejection fraction was in the    range of 60% to 65%. Wall motion was normal; there were no    regional wall motion abnormalities. Left ventricular    diastolic function parameters were normal.  - Aortic valve: Mildly calcified annulus. Trileaflet; mildly    thickened leaflets. Mildly increased transvalvular    velocity. Trivial regurgitation. Peak velocity:211cm/s    (S). Mean gradient:66mm Hg (S). Valve area: 1.97cm^2(VTI).  - Mitral valve: Mild mitral annular calcification.  - Left atrium: The atrium was mildly dilated.      03/24/2021 echo  1. Left ventricular ejection fraction, by estimation, is 60 to 65%. The  left ventricle has normal function. The left ventricle has no regional  wall motion abnormalities. There is mild asymmetric left ventricular  hypertrophy of the basal-septal segment.  Left ventricular diastolic parameters are consistent with Grade I  diastolic dysfunction (impaired relaxation).   2. Right ventricular systolic function is normal. The right ventricular  size is normal. There is normal pulmonary artery systolic pressure.   3. The mitral valve is normal in structure. No evidence of mitral valve  regurgitation. No evidence of mitral stenosis.   4. The aortic valve is normal in structure. Aortic valve regurgitation  is  not visualized. No aortic stenosis is present.   5. The inferior vena cava is normal in size with greater than 50%  respiratory variability, suggesting  right atrial pressure of 3 mmHg.    Assessment and Plan  1. DOE -benign cardiac testing - likely multifactorial with asthma, obesity, deconditioning as activities are limited by severe vision deficit.  - reports in general breathing has been improving, continue to monitor   2.HTN - - multiple recent provider visits 130s/80s despite being elevated today - come Dec 13 for bp check, if remains elevated would increase her valsartan.    3. Hyperlipidemia - LDL at goal, TGs were elevated in setting of HgbA1c up to 8. This has since come down to 5.5, would think on repeat lipids that TGs will improve - continue current meds  EKG today shows SR, PACs    Antoine Poche, M.D.

## 2023-01-18 NOTE — Patient Instructions (Signed)

## 2023-01-25 ENCOUNTER — Inpatient Hospital Stay: Payer: PPO | Attending: Hematology

## 2023-01-25 ENCOUNTER — Ambulatory Visit: Payer: PPO | Attending: Internal Medicine

## 2023-01-25 VITALS — BP 136/78

## 2023-01-25 DIAGNOSIS — R944 Abnormal results of kidney function studies: Secondary | ICD-10-CM | POA: Insufficient documentation

## 2023-01-25 DIAGNOSIS — Z87891 Personal history of nicotine dependence: Secondary | ICD-10-CM | POA: Insufficient documentation

## 2023-01-25 DIAGNOSIS — D509 Iron deficiency anemia, unspecified: Secondary | ICD-10-CM | POA: Insufficient documentation

## 2023-01-25 DIAGNOSIS — I10 Essential (primary) hypertension: Secondary | ICD-10-CM

## 2023-01-25 DIAGNOSIS — Z800 Family history of malignant neoplasm of digestive organs: Secondary | ICD-10-CM | POA: Insufficient documentation

## 2023-01-25 DIAGNOSIS — Z853 Personal history of malignant neoplasm of breast: Secondary | ICD-10-CM | POA: Insufficient documentation

## 2023-01-25 DIAGNOSIS — Z807 Family history of other malignant neoplasms of lymphoid, hematopoietic and related tissues: Secondary | ICD-10-CM | POA: Insufficient documentation

## 2023-01-25 DIAGNOSIS — Z801 Family history of malignant neoplasm of trachea, bronchus and lung: Secondary | ICD-10-CM | POA: Insufficient documentation

## 2023-01-25 DIAGNOSIS — D631 Anemia in chronic kidney disease: Secondary | ICD-10-CM | POA: Insufficient documentation

## 2023-01-25 DIAGNOSIS — D508 Other iron deficiency anemias: Secondary | ICD-10-CM

## 2023-01-25 DIAGNOSIS — N1832 Chronic kidney disease, stage 3b: Secondary | ICD-10-CM | POA: Diagnosis not present

## 2023-01-25 DIAGNOSIS — Z8049 Family history of malignant neoplasm of other genital organs: Secondary | ICD-10-CM | POA: Diagnosis not present

## 2023-01-25 LAB — RETICULOCYTES
Immature Retic Fract: 17.2 % — ABNORMAL HIGH (ref 2.3–15.9)
RBC.: 5.26 MIL/uL — ABNORMAL HIGH (ref 3.87–5.11)
Retic Count, Absolute: 54.2 10*3/uL (ref 19.0–186.0)
Retic Ct Pct: 1 % (ref 0.4–3.1)

## 2023-01-25 LAB — CBC
HCT: 43.8 % (ref 36.0–46.0)
Hemoglobin: 13.3 g/dL (ref 12.0–15.0)
MCH: 25.7 pg — ABNORMAL LOW (ref 26.0–34.0)
MCHC: 30.4 g/dL (ref 30.0–36.0)
MCV: 84.6 fL (ref 80.0–100.0)
Platelets: 225 10*3/uL (ref 150–400)
RBC: 5.18 MIL/uL — ABNORMAL HIGH (ref 3.87–5.11)
RDW: 19.9 % — ABNORMAL HIGH (ref 11.5–15.5)
WBC: 5.9 10*3/uL (ref 4.0–10.5)
nRBC: 0 % (ref 0.0–0.2)

## 2023-01-25 LAB — FERRITIN: Ferritin: 147 ng/mL (ref 11–307)

## 2023-01-25 LAB — IRON AND TIBC
Iron: 65 ug/dL (ref 28–170)
Saturation Ratios: 25 % (ref 10.4–31.8)
TIBC: 258 ug/dL (ref 250–450)
UIBC: 193 ug/dL

## 2023-01-25 LAB — VITAMIN B12: Vitamin B-12: 584 pg/mL (ref 180–914)

## 2023-01-25 LAB — FOLATE: Folate: 27.9 ng/mL (ref >5.9)

## 2023-01-25 LAB — LACTATE DEHYDROGENASE: LDH: 131 U/L (ref 98–192)

## 2023-01-25 NOTE — Progress Notes (Signed)
Patient presents today for nurse visit-bp check per provider. Pt stated she is compliant with medication.  Pt's bp today in office: 136/78   Pt stated that when she checks bp at home it is fine and she had no had any problems at home with bp control.  Pt stated she has SOB but has asthma and allergies and is not concerned with it from a cardiac perspective.

## 2023-01-25 NOTE — Patient Instructions (Signed)
Medication Instructions:  Your physician recommends that you continue on your current medications as directed. Please refer to the Current Medication list given to you today.  *If you need a refill on your cardiac medications before your next appointment, please call your pharmacy*   Lab Work: None If you have labs (blood work) drawn today and your tests are completely normal, you will receive your results only by: MyChart Message (if you have MyChart) OR A paper copy in the mail If you have any lab test that is abnormal or we need to change your treatment, we will call you to review the results.   Testing/Procedures: None   Follow-Up: At [facility], you and your health needs are our priority.  As part of our continuing mission to provide you with exceptional heart care, we have created designated Provider Care Teams.  These Care Teams include your primary Cardiologist (physician) and Advanced Practice Providers (APPs -  Physician Assistants and Nurse Practitioners) who all work together to provide you with the care you need, when you need it.  We recommend signing up for the patient portal called "MyChart".  Sign up information is provided on this After Visit Summary.  MyChart is used to connect with patients for Virtual Visits (Telemedicine).  Patients are able to view lab/test results, encounter notes, upcoming appointments, etc.  Non-urgent messages can be sent to your provider as well.   To learn more about what you can do with MyChart, go to [email or web address].    Your next appointment:    Keep scheduled follow up.   Other Instructions

## 2023-01-26 LAB — KAPPA/LAMBDA LIGHT CHAINS
Kappa free light chain: 52.6 mg/L — ABNORMAL HIGH (ref 3.3–19.4)
Kappa, lambda light chain ratio: 1.83 — ABNORMAL HIGH (ref 0.26–1.65)
Lambda free light chains: 28.8 mg/L — ABNORMAL HIGH (ref 5.7–26.3)

## 2023-01-28 LAB — COPPER, SERUM: Copper: 91 ug/dL (ref 80–158)

## 2023-01-28 NOTE — Progress Notes (Signed)
BP improved from our clinic visit and essentially at goal, would not make med change at this time  Dominga Ferry MD

## 2023-01-29 LAB — METHYLMALONIC ACID, SERUM: Methylmalonic Acid, Quantitative: 594 nmol/L — ABNORMAL HIGH (ref 0–378)

## 2023-01-31 NOTE — Progress Notes (Signed)
[facility] [address], [place] [address]   CLINIC:  Medical Oncology/Hematology  PCP:  Shelly Sams, DO [address] [name] [address] [phone]   REASON FOR VISIT:  Follow-up for iron deficient anemia + anemia of CKD  CURRENT THERAPY: Intermittent IV iron  INTERVAL HISTORY:   Shelly [name] 82 y.o. female returns for routine follow-up of normocytic anemia second iron deficiency and CKD.  She was last seen by Dr. Ellin Saba on 12/05/2022.  She received Monoferric 1000 mg on 12/14/2022.  At today's visit, she reports feeling fair.  No recent hospitalizations, surgeries, or changes in baseline health status.  She reports feeling some improved energy after IV Monoferric, but still has some residual fatigue.  She reports occasional dark stools, but denies any bright red blood per rectum.   No pica, headaches, lightheadedness, or syncope. She has some mild dyspnea on exertion, as well as some shortness of breather from asthma.  She denies any chest pain.  She has 25% energy and 100% appetite. She endorses that she is maintaining a stable weight.   ASSESSMENT & PLAN:  1.  Normocytic anemia, secondary to iron deficiency and CKD - Patient seen at the request of Dr. Adriana Simas - Hgb from 09/21/2022 as low as 7.9, with ferritin around 10 - Anemia from combination of CKD and iron deficiency. - No history of blood transfusions. - She receives monthly B12 injections at home, given by her daughter - Iron levels failed to improve despite oral iron supplementation. - She received IV Monoferric on 12/14/2022 - FOBT positive per PCP office on 12/06/2022.  Was referred to GI per PCP, with plans for upcoming EGD/colonoscopy (scheduled for 02/20/2023) - She reports occasional dark stools, but denies any bright red blood per rectum.   - Most recent labs including full hematology workup (01/25/2023): Hgb 13.3/MCV 84.6. Ferritin 147, iron saturation 25% Reticulocytes 1.0%.   Normal LDH. Vitamin B12 584, MMA elevated at 594.  Normal folate, copper. Immunofixation and SPEP are PENDING Mildly elevated FLC ratio 1.83 with kappa 52.6, lambda 28.8.  Likely secondary to CKD (pending results of SPEP and IFE) Most recent creatinine from 09/21/2022 mildly elevated 1.25 with GFR 44 (CKD IIIa/b -f ollows with Dr. Wolfgang Phoenix) - PLAN: No indication for IV iron at this time.  Continue taking daily iron supplement. - Continue monthly B12 injections at home - Will follow SPEP and immunofixation.  If normal, we will plan on rechecking MGUS/myeloma panel and possible UPEP in about 6 months to rule out any developing plasma cell dyscrasia. - Repeat labs (CBC/D, ferritin, iron/TIBC) and RTC in 3 months (will check B12/MMA in 6 months)  2.  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 Pamelia Hoit.  Most recently seen by him on 03/13/2022, with  recommendations for as needed follow-up only. - Annual mammograms and breast exams via PCP  3.  Social/family history: - She lives by herself at home.  She is legally blind and cannot drive.  Quit smoking at age 57. - Mother had cancer, type not known to the patient.  Sister had smoldering myeloma.  PLAN SUMMARY: >> Labs in 3 months = CBC/D, ferritin, iron/TIBC >> OFFICE visit  in 3 months (same day as labs okay, depending on patient preference)     REVIEW OF SYSTEMS:   Review of Systems  Constitutional:  Positive for fatigue. Negative for appetite change, chills, diaphoresis, fever and unexpected weight change.  HENT:   Negative for lump/mass and nosebleeds.   Eyes:  Negative for eye problems.  Respiratory:  Positive for cough and shortness of breath (asthma). Negative for hemoptysis.   Cardiovascular:  Negative for chest pain, leg swelling and palpitations.  Gastrointestinal:  Negative for abdominal pain, blood in stool, constipation, diarrhea, nausea and vomiting.  Genitourinary:  Negative for hematuria.   Musculoskeletal:  Positive for arthralgias.  Skin: Negative.   Neurological:  Negative for dizziness, headaches and light-headedness.  Hematological:  Does not bruise/bleed easily.  Psychiatric/Behavioral:  The patient is nervous/anxious.      PHYSICAL EXAM:  ECOG PERFORMANCE STATUS: 2 - Symptomatic, <50% confined to bed  Vitals:   02/01/23 1307  BP: 132/62  Pulse: 77  Resp: 20  Temp: 97.7 F (36.5 C)  SpO2: 97%   Physical Exam Constitutional:      Appearance: Normal appearance. She is obese.  Cardiovascular:     Heart sounds: Normal heart sounds.  Pulmonary:     Breath sounds: Normal breath sounds.  Neurological:     General: No focal deficit present.     Mental Status: Mental status is at baseline.  Psychiatric:        Behavior: Behavior normal. Behavior is cooperative.     PAST MEDICAL/SURGICAL HISTORY:  Past Medical History:  Diagnosis Date   Arthritis    "qwhere" (07/01/2012)   Asthma    normal chest x-ray in 2010   Basal cell carcinoma of face    Breast cancer (HCC)    Cholelithiasis    Chronic lower back pain    Degenerative joint disease    Right TKA-2010; low back pain; hands and hips also affected   Exertional shortness of breath    "mostly from being too fat" (07/01/2012)   GERD (gastroesophageal reflux disease)     history   H/O hiatal hernia    Heart murmur    small   Hemorrhoid    HTN (hypertension)    Hyperlipidemia    Macular degeneration    "both eyes" (07/01/2012)   PUD (peptic ulcer disease) 2003   Upper GI bleed-gastric ulcer; gastroesophageal reflux disease   Seasonal allergies    Type II diabetes mellitus (HCC)    Past Surgical History:  Procedure Laterality Date   APPENDECTOMY     BREAST LUMPECTOMY Left    BREAST LUMPECTOMY WITH RADIOACTIVE SEED LOCALIZATION Left 10/21/2017   Procedure: BREAST LUMPECTOMY WITH RADIOACTIVE SEED LOCALIZATION;  Surgeon: Claud Kelp, MD;  Location: [facility];  Service: General;  Laterality: Left;   CATARACT EXTRACTION BILATERAL W/ ANTERIOR VITRECTOMY Bilateral    EYE SURGERY Bilateral    "laser in eyes to stop bleeding and injections for macular degeneration" (07/01/2012)   FOOT SURGERY Bilateral    straighten 1st toe with a wedge.   KNEE ARTHROSCOPY Bilateral  SKIN CANCER EXCISION  2013   "face" (07/01/2012)   TOTAL KNEE ARTHROPLASTY Right 2011   TOTAL KNEE ARTHROPLASTY Left 07/01/2012   TOTAL KNEE ARTHROPLASTY Left 07/01/2012   Procedure: LEFT TOTAL KNEE ARTHROPLASTY;  Surgeon: Valeria Batman, MD;  Location: [facility] OR;  Service: Orthopedics;  Laterality: Left;  Left Total Knee Arthroplasty   TOTAL KNEE REVISION Left 09/17/2022   Procedure: LEFT KNEE RESECTION ARTHROPLASTY, REVISION TOTAL KNEE ARTHROPLASTY;  Surgeon: Tarry Kos, MD;  Location: [facility] OR;  Service: Orthopedics;  Laterality: Left;    SOCIAL HISTORY:  Social History   Socioeconomic History   Marital status: Divorced    Spouse name: Not on file   Number of children: 2   Years of education: Not on file   Highest education level: Some college, no degree  Occupational History   Not on file  Tobacco Use   Smoking status: Former    Current packs/day: 0.00    Average packs/day: 2.5 packs/day for 27.0 years (67.5 ttl pk-yrs)    Types: Cigarettes    Start date: 02/12/1954     Quit date: 02/12/1981    Years since quitting: 41.9   Smokeless tobacco: Never  Vaping Use   Vaping status: Never Used  Substance and Sexual Activity   Alcohol use: No   Drug use: No   Sexual activity: Not Currently  Other Topics Concern   Not on file  Social History Narrative   Lives locally. Works part time at the [employer].    Social Drivers of Corporate investment banker Strain: Low Risk  (12/17/2022)   Overall Financial Resource Strain (CARDIA)    Difficulty of Paying Living Expenses: Not very hard  Food Insecurity: No Food Insecurity (12/17/2022)   Hunger Vital Sign    Worried About Running Out of Food in the Last Year: Never true    Ran Out of Food in the Last Year: Never true  Transportation Needs: No Transportation Needs (12/17/2022)   PRAPARE - Administrator, Civil Service (Medical): No    Lack of Transportation (Non-Medical): No  Physical Activity: Unknown (12/17/2022)   Exercise Vital Sign    Days of Exercise per Week: Patient declined    Minutes of Exercise per Session: 0 min  Recent Concern: Physical Activity - Inactive (11/09/2022)   Exercise Vital Sign    Days of Exercise per Week: 0 days    Minutes of Exercise per Session: 0 min  Stress: No Stress Concern Present (12/17/2022)   [employer] of Occupational Health - Occupational Stress Questionnaire    Feeling of Stress : Only a little  Social Connections: Moderately Isolated (12/17/2022)   Social Connection and Isolation Panel [NHANES]    Frequency of Communication with Friends and Family: More than three times a week    Frequency of Social Gatherings with Friends and Family: Three times a week    Attends Religious Services: 1 to 4 times per year    Active Member of Clubs or Organizations: No    Attends Banker Meetings: Never    Marital Status: Divorced  Catering manager Violence: Not At Risk (11/09/2022)   Humiliation, Afraid, Rape, and Kick  questionnaire    Fear of Current or Ex-Partner: No    Emotionally Abused: No    Physically Abused: No    Sexually Abused: No    FAMILY HISTORY:  Family History  Problem Relation Age of Onset   Hodgkin's lymphoma Mother  Cervical cancer Sister        half sister mom's side   Lung cancer Sister    Colon cancer Sister    Breast cancer Neg Hx     CURRENT MEDICATIONS:  Outpatient Encounter Medications as of 02/01/2023  Medication Sig   acetaminophen (TYLENOL) 650 MG CR tablet Take 650 mg by mouth every 8 (eight) hours as needed for pain.   albuterol (VENTOLIN HFA) 108 (90 Base) MCG/ACT inhaler Inhale 2 puffs into the lungs every 6 (six) hours as needed for shortness of breath.   carvedilol (COREG) 3.125 MG tablet Take 1 tablet (3.125 mg total) by mouth 2 (two) times daily. Dose reduction per patientTake 3.125 mg by mouth 2 (two) times daily. Dose reduction per patient   cefadroxil (DURICEF) 500 MG capsule Take 2 capsules (1,000 mg total) by mouth 2 (two) times daily.   cetirizine (ZYRTEC) 10 MG tablet Take 10 mg by mouth daily as needed for allergies.   cholecalciferol (VITAMIN D3) 25 MCG (1000 UNIT) tablet Take 1,000 Units by mouth daily.   Cyanocobalamin (B-12 COMPLIANCE INJECTION) 1000 MCG/ML KIT Inject 1 mL as directed every 30 (thirty) days.   cyclobenzaprine (FLEXERIL) 5 MG tablet Take 5 mg by mouth 3 (three) times daily as needed for muscle spasms.   fluconazole (DIFLUCAN) 150 MG tablet Take 1 tablet (150 mg total) by mouth once a week.   Iron, Ferrous Sulfate, 325 (65 Fe) MG TABS Take 325 mg by mouth every other day.   lidocaine (XYLOCAINE) 5 % ointment Apply 1 Application topically 3 (three) times daily as needed.   Multiple Vitamins-Minerals (PRESERVISION AREDS PO) Take 1 tablet by mouth 2 (two) times daily.   nystatin (MYCOSTATIN/NYSTOP) powder Apply 1 Application topically 2 (two) times daily.   pravastatin (PRAVACHOL) 10 MG tablet Take 1 tablet (10 mg total) by mouth every  evening. Take 10 mg by mouth every evening.   sertraline (ZOLOFT) 100 MG tablet Take 1 tablet (100 mg total) by mouth daily.   Sod Picosulfate-Mag Ox-Cit Acd (CLENPIQ) 10-3.5-12 MG-GM -GM/175ML SOLN Take 1 kit by mouth as directed.   valsartan (DIOVAN) 80 MG tablet Take 1 tablet (80 mg total) by mouth daily.   No facility-administered encounter medications on file as of 02/01/2023.    ALLERGIES:  Allergies  Allergen Reactions   Celebrex [Celecoxib] Other (See Comments)    Abdominal pain; history of upper GI bleed    Claritin [Loratadine] Hives and Swelling    blisters   Penicillins Hives   Percocet [Oxycodone-Acetaminophen] Other (See Comments)    Severe constipation    LABORATORY DATA:  I have reviewed the labs as listed.  CBC    Component Value Date/Time   WBC 5.9 01/25/2023 1118   RBC 5.26 (H) 01/25/2023 1119   RBC 5.18 (H) 01/25/2023 1118   HGB 13.3 01/25/2023 1118   HGB 13.5 04/16/2022 1600   HCT 43.8 01/25/2023 1118   HCT 43.5 04/16/2022 1600   PLT 225 01/25/2023 1118   PLT 272 04/16/2022 1600   MCV 84.6 01/25/2023 1118   MCV 84 04/16/2022 1600   MCH 25.7 (L) 01/25/2023 1118   MCHC 30.4 01/25/2023 1118   RDW 19.9 (H) 01/25/2023 1118   RDW 13.3 04/16/2022 1600   LYMPHSABS 1.2 09/21/2022 0940   MONOABS 0.5 09/21/2022 0940   EOSABS 0.2 09/21/2022 0940   BASOSABS 0.1 09/21/2022 0940      Latest Ref Rng & Units 09/21/2022    9:40 AM 09/20/2022  3:15 AM 09/19/2022   12:48 AM  CMP  Glucose 70 - 99 mg/dL 161  54  096   BUN 8 - 23 mg/dL 34  50  45   Creatinine 0.44 - 1.00 mg/dL 0.45  4.09  8.11   Sodium 135 - 145 mmol/L 135  136  131   Potassium 3.5 - 5.1 mmol/L 4.9  5.1  5.3   Chloride 98 - 111 mmol/L 104  106  96   CO2 22 - 32 mmol/L 25  24  23    Calcium 8.9 - 10.3 mg/dL 8.2  8.2  8.9     DIAGNOSTIC IMAGING:  I have independently reviewed the relevant imaging and discussed with the patient.   WRAP UP:  All questions were answered. The patient knows to  call the clinic with any problems, questions or concerns.  Medical decision making: Moderate  Time spent on visit: I spent 20 minutes counseling the patient face to face. The total time spent in the appointment was 30 minutes and more than 50% was on counseling.  Carnella Guadalajara, PA-C  02/01/23 10:17 PM

## 2023-02-01 ENCOUNTER — Inpatient Hospital Stay (HOSPITAL_BASED_OUTPATIENT_CLINIC_OR_DEPARTMENT_OTHER): Payer: PPO | Admitting: Physician Assistant

## 2023-02-01 ENCOUNTER — Ambulatory Visit: Payer: PPO

## 2023-02-01 VITALS — BP 132/62 | HR 77 | Temp 97.7°F | Resp 20

## 2023-02-01 DIAGNOSIS — D509 Iron deficiency anemia, unspecified: Secondary | ICD-10-CM | POA: Diagnosis not present

## 2023-02-01 NOTE — Patient Instructions (Signed)
[facility] at [facility] **VISIT SUMMARY & IMPORTANT INSTRUCTIONS **   You were seen today by Rojelio Brenner PA-C for your follow-up visit.    ANEMIA: Your blood counts look MUCH better after receiving IV iron, so I suspect that that was the main reason behind your anemia, although your chronic kidney disease may have been a small contributing factor. Your iron deficiency is most likely related to blood loss from your stomach or intestines. Follow-up with [facility] for your EGD/colonoscopy.  To confirm when that is scheduled, you can reach their office at [phone].  You do not need any IV iron at this time. Continue taking daily iron supplement. Continue monthly B12 injections.  FOLLOW-UP APPOINTMENT: Labs and office visit in 3 months  ** Thank you for trusting me with your healthcare!  I strive to provide all of my patients with quality care at each visit.  If you receive a survey for this visit, I would be so grateful to you for taking the time to provide feedback.  Thank you in advance!  ~ Kasheena Sambrano                   Dr. Doreatha Massed   &   Rojelio Brenner, PA-C   - - - - - - - - - - - - - - - - - -    Thank you for choosing [facility] at [facility] to provide your oncology and hematology care.  To afford each patient quality time with our provider, please arrive at least 15 minutes before your scheduled appointment time.   If you have a lab appointment with the Cancer Center please come in thru the Main Entrance and check in at the main information desk.  You need to re-schedule your appointment should you arrive 10 or more minutes late.  We strive to give you quality time with our providers, and arriving late affects you and other patients whose appointments are after yours.  Also, if you no show three or more times for appointments you may be dismissed from the clinic at the providers  discretion.     Again, thank you for choosing [facility].  Our hope is that these requests will decrease the amount of time that you wait before being seen by our physicians.       _____________________________________________________________  Should you have questions after your visit to [facility], please contact our office at [phone] and follow the prompts.  Our office hours are 8:00 a.m. and 4:30 p.m. Monday - Friday.  Please note that voicemails left after 4:00 p.m. may not be returned until the following business day.  We are closed weekends and major holidays.  You do have access to a nurse 24-7, just call the main number to the clinic [phone] and do not press any options, hold on the line and a nurse will answer the phone.    For prescription refill requests, have your pharmacy contact our office and allow 72 hours.

## 2023-02-03 LAB — PROTEIN ELECTROPHORESIS, SERUM
A/G Ratio: 1.3 (ref 0.7–1.7)
Albumin ELP: 3.7 g/dL (ref 2.9–4.4)
Alpha-1-Globulin: 0.2 g/dL (ref 0.0–0.4)
Alpha-2-Globulin: 0.7 g/dL (ref 0.4–1.0)
Beta Globulin: 0.9 g/dL (ref 0.7–1.3)
Gamma Globulin: 1.1 g/dL (ref 0.4–1.8)
Globulin, Total: 2.9 g/dL (ref 2.2–3.9)
Total Protein ELP: 6.6 g/dL (ref 6.0–8.5)

## 2023-02-08 LAB — IMMUNOFIXATION ELECTROPHORESIS
IgA: 237 mg/dL (ref 64–422)
IgG (Immunoglobin G), Serum: 1215 mg/dL (ref 586–1602)
IgM (Immunoglobulin M), Srm: 61 mg/dL (ref 26–217)
Total Protein ELP: 6.5 g/dL (ref 6.0–8.5)

## 2023-02-10 ENCOUNTER — Other Ambulatory Visit: Payer: Self-pay | Admitting: Family Medicine

## 2023-02-14 NOTE — Patient Instructions (Signed)
 Shelly [name]  02/14/2023     @PREFPERIOPPHARMACY @   Your procedure is scheduled on 02/20/2023.   [facility] to [facility] at 8:15 am A.M.   Call this number if you have problems the morning of surgery:   [phone]  If you experience any cold or flu symptoms such as cough, fever, chills, shortness of breath, etc. between now and your scheduled surgery, please notify us  at the above number.   Remember:   Please Follow the diet and prep instructions given to you by Dr Ivonne office.    You may drink clear liquids until 6:15am .  Clear liquids allowed are:                    Water , Carbonated beverages (diabetics please choose diet or no sugar options), Clear Tea (No creamer, milk, or cream, including half & half and powdered creamer), and Clear Sports drink (No red color; diabetics please choose diet or no sugar options)    Take these medicines the morning of surgery with A SIP OF WATER  : Carvedilol  Zyrtec and Zoloft    Please use your Albuterol  Inhaler before coming to the hospital.     Do not wear jewelry, make-up or nail polish, including gel polish,  artificial nails, or any other type of covering on natural nails (fingers and  toes).  Do not wear lotions, powders, or perfumes, or deodorant.  Do not shave 48 hours prior to surgery.  Men may shave face and neck.  Do not bring valuables to the hospital.  [facility] is not responsible for any belongings or valuables.  Contacts, dentures or bridgework may not be worn into surgery.  Leave your suitcase in the car.  After surgery it may be brought to your room.  For patients admitted to the hospital, discharge time will be determined by your treatment team.  Patients discharged the day of surgery will not be allowed to drive home.   Name and phone number of your driver:   Family Special instructions:  N/A  Please read over the following fact sheets that you were given. Care and Recovery After Surgery  Colonoscopy,  Adult A colonoscopy is a procedure to look at the entire large intestine. This procedure is done using a long, thin, flexible tube that has a camera on the end. You may have a colonoscopy: As a part of normal colorectal screening. If you have certain symptoms, such as: A low number of red blood cells in your blood (anemia). Diarrhea that does not go away. Pain in your abdomen. Blood in your stool. A colonoscopy can help screen for and diagnose medical problems, including: An abnormal growth of cells or tissue (tumor). Abnormal growths within the lining of your intestine (polyps). Inflammation. Areas of bleeding. Tell your health care provider about: Any allergies you have. All medicines you are taking, including vitamins, herbs, eye drops, creams, and over-the-counter medicines. Any problems you or family members have had with anesthetic medicines. Any bleeding problems you have. Any surgeries you have had. Any medical conditions you have. Any problems you have had with having bowel movements. Whether you are pregnant or may be pregnant. What are the risks? Generally, this is a safe procedure. However, problems may occur, including: Bleeding. Damage to your intestine. Allergic reactions to medicines given during the procedure. Infection. This is rare. What happens before the procedure? Eating and drinking restrictions Follow instructions from your health care provider about eating or drinking restrictions, which  may include: A few days before the procedure: Follow a low-fiber diet. Avoid nuts, seeds, dried fruit, raw fruits, and vegetables. 1-3 days before the procedure: Eat only gelatin dessert or ice pops. Drink only clear liquids, such as water , clear juice, clear broth or bouillon, black coffee or tea, or clear soft drinks or sports drinks. Avoid liquids that contain red or purple dye. The day of the procedure: Do not eat solid foods. You may continue to drink clear  liquids until up to 2 hours before the procedure. Do not eat or drink anything starting 2 hours before the procedure, or within the time period that your health care provider recommends. Bowel prep If you were prescribed a bowel prep to take by mouth (orally) to clean out your colon: Take it as told by your health care provider. Starting the day before your procedure, you will need to drink a large amount of liquid medicine. The liquid will cause you to have many bowel movements of loose stool until your stool becomes almost clear or light green. If your skin or the opening between the buttocks (anus) gets irritated from diarrhea, you may relieve the irritation using: Wipes with medicine in them, such as adult wet wipes with aloe and vitamin E. A product to soothe skin, such as petroleum jelly. If you vomit while drinking the bowel prep: Take a break for up to 60 minutes. Begin the bowel prep again. Call your health care provider if you keep vomiting or you cannot take the bowel prep without vomiting. To clean out your colon, you may also be given: Laxative medicines. These help you have a bowel movement. Instructions for enema use. An enema is liquid medicine injected into your rectum. Medicines Ask your health care provider about: Changing or stopping your regular medicines or supplements. This is especially important if you are taking iron  supplements, diabetes medicines, or blood thinners. Taking medicines such as aspirin  and ibuprofen. These medicines can thin your blood. Do not take these medicines unless your health care provider tells you to take them. Taking over-the-counter medicines, vitamins, herbs, and supplements. General instructions Ask your health care provider what steps will be taken to help prevent infection. These may include washing skin with a germ-killing soap. If you will be going home right after the procedure, plan to have a responsible adult: Take you home from the  hospital or clinic. You will not be allowed to drive. Care for you for the time you are told. What happens during the procedure?  An IV will be inserted into one of your veins. You will be given a medicine to make you fall asleep (general anesthetic). You will lie on your side with your knees bent. A lubricant will be put on the tube. Then the tube will be: Inserted into your anus. Gently eased through all parts of your large intestine. Air will be sent into your colon to keep it open. This may cause some pressure or cramping. Images will be taken with the camera and will appear on a screen. A small tissue sample may be removed to be looked at under a microscope (biopsy). The tissue may be sent to a lab for testing if any signs of problems are found. If small polyps are found, they may be removed and checked for cancer cells. When the procedure is finished, the tube will be removed. The procedure may vary among health care providers and hospitals. What happens after the procedure? Your blood pressure, heart rate, breathing  rate, and blood oxygen level will be monitored until you leave the hospital or clinic. You may have a small amount of blood in your stool. You may pass gas and have mild cramping or bloating in your abdomen. This is caused by the air that was used to open your colon during the exam. If you were given a sedative during the procedure, it can affect you for several hours. Do not drive or operate machinery until your health care provider says that it is safe. It is up to you to get the results of your procedure. Ask your health care provider, or the department that is doing the procedure, when your results will be ready. Summary A colonoscopy is a procedure to look at the entire large intestine. Follow instructions from your health care provider about eating and drinking before the procedure. If you were prescribed an oral bowel prep to clean out your colon, take it as told by  your health care provider. During the colonoscopy, a flexible tube with a camera on its end is inserted into the anus and then passed into all parts of the large intestine. This information is not intended to replace advice given to you by your health care provider. Make sure you discuss any questions you have with your health care provider. Document Revised: 03/13/2022 Document Reviewed: 09/21/2020 Elsevier Patient Education  2024 [employer]  Upper Endoscopy, Adult Upper endoscopy is a procedure to look inside the upper GI (gastrointestinal) tract. The upper GI tract is made up of: The esophagus. This is the part of the body that moves food from your mouth to your stomach. The stomach. The duodenum. This is the first part of your small intestine. This procedure is also called esophagogastroduodenoscopy (EGD) or gastroscopy. In this procedure, your health care provider passes a thin, flexible tube (endoscope) through your mouth and down your esophagus into your stomach and into your duodenum. A small camera is attached to the end of the tube. Images from the camera appear on a monitor in the exam room. During this procedure, your health care provider may also remove a small piece of tissue to be sent to a lab and examined under a microscope (biopsy). Your health care provider may do an upper endoscopy to diagnose cancers of the upper GI tract. You may also have this procedure to find the cause of other conditions, such as: Stomach pain. Heartburn. Pain or problems when swallowing. Nausea and vomiting. Stomach bleeding. Stomach ulcers. Tell a health care provider about: Any allergies you have. All medicines you are taking, including vitamins, herbs, eye drops, creams, and over-the-counter medicines. Any problems you or family members have had with anesthetic medicines. Any bleeding problems you have. Any surgeries you have had. Any medical conditions you have. Whether you are pregnant or  may be pregnant. What are the risks? Your healthcare provider will talk with you about risks. These may include: Infection. Bleeding. Allergic reactions to medicines. A tear or hole (perforation) in the esophagus, stomach, or duodenum. What happens before the procedure? When to stop eating and drinking Follow instructions from your health care provider about what you may eat and drink. These may include: 8 hours before your procedure Stop eating most foods. Do not eat meat, fried foods, or fatty foods. Eat only light foods, such as toast or crackers. All liquids are okay except energy drinks and alcohol. 6 hours before your procedure Stop eating. Drink only clear liquids, such as water , clear fruit juice, black  coffee, plain tea, and sports drinks. Do not drink energy drinks or alcohol. 2 hours before your procedure Stop drinking all liquids. You may be allowed to take medicines with small sips of water . If you do not follow your health care provider's instructions, your procedure may be delayed or canceled. Medicines Ask your health care provider about: Changing or stopping your regular medicines. This is especially important if you are taking diabetes medicines or blood thinners. Taking medicines such as aspirin  and ibuprofen. These medicines can thin your blood. Do not take these medicines unless your health care provider tells you to take them. Taking over-the-counter medicines, vitamins, herbs, and supplements. General instructions If you will be going home right after the procedure, plan to have a responsible adult: Take you home from the hospital or clinic. You will not be allowed to drive. Care for you for the time you are told. What happens during the procedure?  An IV will be inserted into one of your veins. You may be given one or more of the following: A medicine to help you relax (sedative). A medicine to numb the throat (local anesthetic). You will lie on your left  side on an exam table. Your health care provider will pass the endoscope through your mouth and down your esophagus. Your health care provider will use the scope to check the inside of your esophagus, stomach, and duodenum. Biopsies may be taken. The endoscope will be removed. The procedure may vary among health care providers and hospitals. What happens after the procedure? Your blood pressure, heart rate, breathing rate, and blood oxygen level will be monitored until you leave the hospital or clinic. When your throat is no longer numb, you may be given some fluids to drink. If you were given a sedative during the procedure, it can affect you for several hours. Do not drive or operate machinery until your health care provider says that it is safe. It is up to you to get the results of your procedure. Ask your health care provider, or the department that is doing the procedure, when your results will be ready. Contact a health care provider if you: Have a sore throat that lasts longer than 1 day. Have a fever. Get help right away if you: Vomit blood or your vomit looks like coffee grounds. Have bloody, black, or tarry stools. Have a very bad sore throat or you cannot swallow. Have difficulty breathing or very bad pain in your chest or abdomen. These symptoms may be an emergency. Get help right away. Call 911. Do not wait to see if the symptoms will go away. Do not drive yourself to the hospital. Summary Upper endoscopy is a procedure to look inside the upper GI tract. During the procedure, an IV will be inserted into one of your veins. You may be given a medicine to help you relax. The endoscope will be passed through your mouth and down your esophagus. Follow instructions from your health care provider about what you can eat and drink. This information is not intended to replace advice given to you by your health care provider. Make sure you discuss any questions you have with your health  care provider. Document Revised: 05/10/2021 Document Reviewed: 05/10/2021 Elsevier Patient Education  2024 [employer]  Monitored Anesthesia Care Anesthesia refers to the techniques, procedures, and medicines that help a person stay safe and comfortable during surgery. Monitored anesthesia care, or sedation, is one type of anesthesia. You may have sedation if you do  not need to be asleep for your procedure. Procedures that use sedation may include: Surgery to remove cataracts from your eyes. A dental procedure. A biopsy. This is when a tissue sample is removed and looked at under a microscope. You will be watched closely during your procedure. Your level of sedation or type of anesthesia may be changed to fit your needs. Tell a health care provider about: Any allergies you have. All medicines you are taking, including vitamins, herbs, eye drops, creams, and over-the-counter medicines. Any problems you or family members have had with anesthesia. Any bleeding problems you have. Any surgeries you have had. Any medical conditions or illnesses you have. This includes sleep apnea, cough, fever, or the flu. Whether you are pregnant or may be pregnant. Whether you use cigarettes, alcohol, or drugs. Any use of steroids, whether by mouth or as a cream. What are the risks? Your health care provider will talk with you about risks. These may include: Getting too much medicine (oversedation). Nausea. Allergic reactions to medicines. Trouble breathing. If this happens, a breathing tube may be used to help you breathe. It will be removed when you are awake and breathing on your own. Heart trouble. Lung trouble. Confusion that gets better with time (emergence delirium). What happens before the procedure? When to stop eating and drinking Follow instructions from your health care provider about what you may eat and drink. These may include: 8 hours before your procedure Stop eating most foods. Do  not eat meat, fried foods, or fatty foods. Eat only light foods, such as toast or crackers. All liquids are okay except energy drinks and alcohol. 6 hours before your procedure Stop eating. Drink only clear liquids, such as water , clear fruit juice, black coffee, plain tea, and sports drinks. Do not drink energy drinks or alcohol. 2 hours before your procedure Stop drinking all liquids. You may be allowed to take medicines with small sips of water . If you do not follow your health care provider's instructions, your procedure may be delayed or canceled. Medicines Ask your health care provider about: Changing or stopping your regular medicines. These include any diabetes medicines or blood thinners you take. Taking medicines such as aspirin  and ibuprofen. These medicines can thin your blood. Do not take them unless your health care provider tells you to. Taking over-the-counter medicines, vitamins, herbs, and supplements. Testing You may have an exam or testing. You may have a blood or urine sample taken. General instructions Do not use any products that contain nicotine or tobacco for at least 4 weeks before the procedure. These products include cigarettes, chewing tobacco, and vaping devices, such as e-cigarettes. If you need help quitting, ask your health care provider. If you will be going home right after the procedure, plan to have a responsible adult: Take you home from the hospital or clinic. You will not be allowed to drive. Care for you for the time you are told. What happens during the procedure?  Your blood pressure, heart rate, breathing, level of pain, and blood oxygen level will be monitored. An IV will be inserted into one of your veins. You may be given: A sedative. This helps you relax. Anesthesia. This will: Numb certain areas of your body. Make you fall asleep for surgery. You will be given medicines as needed to keep you comfortable. The more medicine you are  given, the deeper your level of sedation will be. Your level of sedation may be changed to fit your needs. There are  three levels of sedation: Mild sedation. At this level, you may feel awake and relaxed. You will be able to follow directions. Moderate sedation. At this level, you will be sleepy. You may not remember the procedure. Deep sedation. At this level, you will be asleep. You will not remember the procedure. How you get the medicines will depend on your age and the procedure. They may be given as: A pill. This may be taken by mouth (orally) or inserted into the rectum. An injection. This may be into a vein or muscle. A spray through the nose. After your procedure is over, the medicine will be stopped. The procedure may vary among health care providers and hospitals. What happens after the procedure? Your blood pressure, heart rate, breathing rate, and blood oxygen level will be monitored until you leave the hospital or clinic. You may feel sleepy, clumsy, or nauseous. You may not remember what happened during or after the procedure. Sedation can affect you for several hours. Do not drive or use machinery until your health care provider says that it is safe. This information is not intended to replace advice given to you by your health care provider. Make sure you discuss any questions you have with your health care provider. Document Revised: 06/26/2021 Document Reviewed: 06/26/2021 Elsevier Patient Education  2024 [employer].

## 2023-02-15 ENCOUNTER — Encounter (HOSPITAL_COMMUNITY): Payer: Self-pay

## 2023-02-15 ENCOUNTER — Encounter (HOSPITAL_COMMUNITY)
Admission: RE | Admit: 2023-02-15 | Discharge: 2023-02-15 | Disposition: A | Discharge status: Home / Self Care | Payer: PPO | Source: Ambulatory Visit | Attending: Internal Medicine | Admitting: Internal Medicine

## 2023-02-15 VITALS — BP 132/62 | HR 77 | Temp 97.7°F | Resp 18 | Ht 63.0 in | Wt 213.0 lb

## 2023-02-15 DIAGNOSIS — Z01812 Encounter for preprocedural laboratory examination: Secondary | ICD-10-CM | POA: Insufficient documentation

## 2023-02-15 DIAGNOSIS — Z01818 Encounter for other preprocedural examination: Secondary | ICD-10-CM | POA: Diagnosis present

## 2023-02-15 DIAGNOSIS — E0800 Diabetes mellitus due to underlying condition with hyperosmolarity without nonketotic hyperglycemic-hyperosmolar coma (NKHHC): Secondary | ICD-10-CM

## 2023-02-15 DIAGNOSIS — E119 Type 2 diabetes mellitus without complications: Secondary | ICD-10-CM | POA: Insufficient documentation

## 2023-02-15 LAB — BASIC METABOLIC PANEL
Anion gap: 6 (ref 5–15)
BUN: 48 mg/dL — ABNORMAL HIGH (ref 8–23)
CO2: 26 mmol/L (ref 22–32)
Calcium: 8.8 mg/dL — ABNORMAL LOW (ref 8.9–10.3)
Chloride: 104 mmol/L (ref 98–111)
Creatinine, Ser: 1.12 mg/dL — ABNORMAL HIGH (ref 0.44–1.00)
GFR, Estimated: 49 mL/min — ABNORMAL LOW (ref >60)
Glucose, Bld: 152 mg/dL — ABNORMAL HIGH (ref 70–99)
Potassium: 5.1 mmol/L (ref 3.5–5.1)
Sodium: 136 mmol/L (ref 135–145)

## 2023-02-20 ENCOUNTER — Encounter (HOSPITAL_COMMUNITY): Payer: Self-pay | Admitting: Internal Medicine

## 2023-02-20 ENCOUNTER — Ambulatory Visit (HOSPITAL_COMMUNITY): Payer: PPO | Admitting: Anesthesiology

## 2023-02-20 ENCOUNTER — Ambulatory Visit (HOSPITAL_COMMUNITY)
Admission: RE | Admit: 2023-02-20 | Discharge: 2023-02-20 | Disposition: A | Discharge status: Home / Self Care | Payer: PPO | Attending: Internal Medicine | Admitting: Internal Medicine

## 2023-02-20 ENCOUNTER — Encounter (HOSPITAL_COMMUNITY)
Admission: RE | Disposition: A | Discharge status: Home / Self Care | Payer: Self-pay | Source: Home / Self Care | Attending: Internal Medicine

## 2023-02-20 DIAGNOSIS — K5730 Diverticulosis of large intestine without perforation or abscess without bleeding: Secondary | ICD-10-CM | POA: Diagnosis not present

## 2023-02-20 DIAGNOSIS — K5790 Diverticulosis of intestine, part unspecified, without perforation or abscess without bleeding: Secondary | ICD-10-CM

## 2023-02-20 DIAGNOSIS — K259 Gastric ulcer, unspecified as acute or chronic, without hemorrhage or perforation: Secondary | ICD-10-CM | POA: Insufficient documentation

## 2023-02-20 DIAGNOSIS — K2970 Gastritis, unspecified, without bleeding: Secondary | ICD-10-CM | POA: Diagnosis not present

## 2023-02-20 DIAGNOSIS — K635 Polyp of colon: Secondary | ICD-10-CM | POA: Diagnosis not present

## 2023-02-20 DIAGNOSIS — E119 Type 2 diabetes mellitus without complications: Secondary | ICD-10-CM | POA: Diagnosis not present

## 2023-02-20 DIAGNOSIS — Z87891 Personal history of nicotine dependence: Secondary | ICD-10-CM | POA: Diagnosis not present

## 2023-02-20 DIAGNOSIS — I10 Essential (primary) hypertension: Secondary | ICD-10-CM | POA: Diagnosis not present

## 2023-02-20 DIAGNOSIS — J45909 Unspecified asthma, uncomplicated: Secondary | ICD-10-CM | POA: Diagnosis not present

## 2023-02-20 DIAGNOSIS — K319 Disease of stomach and duodenum, unspecified: Secondary | ICD-10-CM | POA: Diagnosis not present

## 2023-02-20 DIAGNOSIS — R195 Other fecal abnormalities: Secondary | ICD-10-CM | POA: Diagnosis not present

## 2023-02-20 DIAGNOSIS — K449 Diaphragmatic hernia without obstruction or gangrene: Secondary | ICD-10-CM

## 2023-02-20 DIAGNOSIS — K640 First degree hemorrhoids: Secondary | ICD-10-CM

## 2023-02-20 DIAGNOSIS — D509 Iron deficiency anemia, unspecified: Secondary | ICD-10-CM | POA: Diagnosis not present

## 2023-02-20 DIAGNOSIS — K219 Gastro-esophageal reflux disease without esophagitis: Secondary | ICD-10-CM | POA: Insufficient documentation

## 2023-02-20 DIAGNOSIS — K3189 Other diseases of stomach and duodenum: Secondary | ICD-10-CM | POA: Diagnosis not present

## 2023-02-20 DIAGNOSIS — D120 Benign neoplasm of cecum: Secondary | ICD-10-CM | POA: Diagnosis not present

## 2023-02-20 HISTORY — PX: BIOPSY: SHX5522

## 2023-02-20 HISTORY — PX: COLONOSCOPY WITH PROPOFOL: SHX5780

## 2023-02-20 HISTORY — PX: ESOPHAGOGASTRODUODENOSCOPY (EGD) WITH PROPOFOL: SHX5813

## 2023-02-20 HISTORY — PX: POLYPECTOMY: SHX5525

## 2023-02-20 LAB — GLUCOSE, CAPILLARY: Glucose-Capillary: 128 mg/dL — ABNORMAL HIGH (ref 70–99)

## 2023-02-20 SURGERY — COLONOSCOPY WITH PROPOFOL
Anesthesia: General

## 2023-02-20 MED ORDER — LACTATED RINGERS IV SOLN: INTRAVENOUS | Status: DC | PRN

## 2023-02-20 MED ORDER — PROPOFOL 500 MG/50ML IV EMUL
INTRAVENOUS | Status: DC | PRN
  Administered 2023-02-20: 150 ug/kg/min via INTRAVENOUS

## 2023-02-20 MED ORDER — PROPOFOL 10 MG/ML IV BOLUS
INTRAVENOUS | Status: DC | PRN
  Administered 2023-02-20: 50 mg via INTRAVENOUS
  Administered 2023-02-20: 30 mg via INTRAVENOUS

## 2023-02-20 MED ORDER — LIDOCAINE HCL (PF) 2 % IJ SOLN
INTRAMUSCULAR | Status: AC
  Filled 2023-02-20: qty 5

## 2023-02-20 MED ORDER — PHENYLEPHRINE 80 MCG/ML (10ML) SYRINGE FOR IV PUSH (FOR BLOOD PRESSURE SUPPORT)
PREFILLED_SYRINGE | INTRAVENOUS | Status: DC | PRN
  Administered 2023-02-20 (×2): 160 ug via INTRAVENOUS
  Administered 2023-02-20: 80 ug via INTRAVENOUS

## 2023-02-20 MED ORDER — PHENYLEPHRINE 80 MCG/ML (10ML) SYRINGE FOR IV PUSH (FOR BLOOD PRESSURE SUPPORT)
PREFILLED_SYRINGE | INTRAVENOUS | Status: AC
  Filled 2023-02-20: qty 10

## 2023-02-20 MED ORDER — LIDOCAINE HCL (CARDIAC) PF 100 MG/5ML IV SOSY
PREFILLED_SYRINGE | INTRAVENOUS | Status: DC | PRN
  Administered 2023-02-20: 60 mg via INTRAVENOUS

## 2023-02-20 NOTE — Op Note (Signed)
 [facility] Patient Name: Shelly [name] Procedure Date: 02/20/2023 10:48 AM MRN: [id number] Date of Birth: 09-13-1940 Attending MD: Lamar Ozell Hollingshead , MD, [id number] CSN: [id number] Age: 83 Admit Type: Outpatient Procedure:                Upper GI endoscopy Indications:              Heme positive stool Providers:                Lamar Ozell Hollingshead, MD, Jon LABOR. Museum/gallery Exhibitions Officer, [employer]ITY FUNDRAISER,                            Mickel CROME. Shirlean Balm, Technician Referring MD:              Medicines:                Propofol  per Anesthesia Complications:            No immediate complications. Estimated Blood Loss:     Estimated blood loss was minimal. Procedure:                Pre-Anesthesia Assessment:                           - Prior to the procedure, a History and Physical                            was performed, and patient medications and                            allergies were reviewed. The patient's tolerance of                            previous anesthesia was also reviewed. The risks                            and benefits of the procedure and the sedation                            options and risks were discussed with the patient.                            All questions were answered, and informed consent                            was obtained. Prior Anticoagulants: The patient has                            taken no anticoagulant or antiplatelet agents. ASA                            Grade Assessment: III - A patient with severe                            systemic disease. After reviewing the risks and  benefits, the patient was deemed in satisfactory                            condition to undergo the procedure.                           After obtaining informed consent, the endoscope was                            passed under direct vision. Throughout the                            procedure, the patient's blood pressure, pulse, and                             oxygen saturations were monitored continuously. The                            GIF-H190 ([id number]) scope was introduced through the                            mouth, and advanced to the second part of duodenum.                            The upper GI endoscopy was accomplished without                            difficulty. The patient tolerated the procedure                            well. Scope In: 11:40:15 AM Scope Out: 11:44:25 AM Total Procedure Duration: 0 hours 4 minutes 10 seconds  Findings:      The examined esophagus was normal. Gastric cavity empty. Scattered       hemorrhagic erosions. No ulcer or infiltrating process. Patent pylorus.      The duodenal bulb and second portion of the duodenum were normal.       Gastric mucosal biopsies taken. Impression:               - Normal esophagus. Gastric hemorrhagic erosions.                            Status post biopsy.                           - Normal duodenal bulb and second portion of the                            duodenum. Moderate Sedation:      Moderate (conscious) sedation was personally administered by an       anesthesia professional. The following parameters were monitored: oxygen       saturation, heart rate, blood pressure, respiratory rate, EKG, adequacy       of pulmonary ventilation, and response to care. Recommendation:           - Patient has a contact number  available for                            emergencies. The signs and symptoms of potential                            delayed complications were discussed with the                            patient. Return to normal activities tomorrow.                            Written discharge instructions were provided to the                            patient.                           - Advance diet as tolerated. Follow-up on                            pathology. Further recommendations to follow. See                            colonoscopy report. Procedure  Code(s):        --- Professional ---                           [phone], Esophagogastroduodenoscopy, flexible,                            transoral; diagnostic, including collection of                            specimen(s) by brushing or washing, when performed                            (separate procedure) Diagnosis Code(s):        --- Professional ---                           R19.5, Other fecal abnormalities CPT copyright 2022 [employer]. All rights reserved. The codes documented in this report are preliminary and upon coder review may  be revised to meet current compliance requirements. Lamar HERO. Lelynd Poer, MD Lamar Ozell Hollingshead, MD 02/20/2023 11:51:57 AM This report has been signed electronically. Number of Addenda: 0

## 2023-02-20 NOTE — Anesthesia Preprocedure Evaluation (Signed)
 Anesthesia Evaluation  Patient identified by MRN, date of birth, ID band Patient awake    Reviewed: Allergy & Precautions, H&P , NPO status , Patient's Chart, lab work & pertinent test results, reviewed documented beta blocker date and time   Airway Mallampati: II  TM Distance: >3 FB Neck ROM: full    Dental no notable dental hx.    Pulmonary neg pulmonary ROS, asthma , former smoker   Pulmonary exam normal breath sounds clear to auscultation       Cardiovascular Exercise Tolerance: Good hypertension, negative cardio ROS + Valvular Problems/Murmurs  Rhythm:regular Rate:Normal     Neuro/Psych  PSYCHIATRIC DISORDERS Anxiety Depression    negative neurological ROS  negative psych ROS   GI/Hepatic negative GI ROS, Neg liver ROS, hiatal hernia, PUD,GERD  ,,  Endo/Other  negative endocrine ROSdiabetes    Renal/GU Renal diseasenegative Renal ROS  negative genitourinary   Musculoskeletal   Abdominal   Peds  Hematology negative hematology ROS (+) Blood dyscrasia, anemia   Anesthesia Other Findings   Reproductive/Obstetrics negative OB ROS                             Anesthesia Physical Anesthesia Plan  ASA: 3  Anesthesia Plan: General   Post-op Pain Management:    Induction:   PONV Risk Score and Plan: Propofol  infusion  Airway Management Planned:   Additional Equipment:   Intra-op Plan:   Post-operative Plan:   Informed Consent: I have reviewed the patients History and Physical, chart, labs and discussed the procedure including the risks, benefits and alternatives for the proposed anesthesia with the patient or authorized representative who has indicated his/her understanding and acceptance.     Dental Advisory Given  Plan Discussed with: CRNA  Anesthesia Plan Comments:        Anesthesia Quick Evaluation

## 2023-02-20 NOTE — Transfer of Care (Addendum)
 Immediate Anesthesia Transfer of Care Note  Patient: Shelly [name]  Procedure(s) Performed: COLONOSCOPY WITH PROPOFOL  ESOPHAGOGASTRODUODENOSCOPY (EGD) WITH PROPOFOL  POLYPECTOMY BIOPSY  Patient Location: Short Stay  Anesthesia Type:General  Level of Consciousness: drowsy and patient cooperative  Airway & Oxygen Therapy: Patient Spontanous Breathing  Post-op Assessment: Report given to RN and Post -op Vital signs reviewed and stable  Post vital signs: Reviewed and stable  Last Vitals:  Vitals Value Taken Time  BP 116/52 02/19/22   1152  Temp 36.3 02/19/22   1152  Pulse 75 02/19/22   1152  Resp 19 02/19/22   1152  SpO2 99% 02/19/22   1152    Last Pain:  Vitals:   02/20/23 1115  TempSrc:   PainSc: 0-No pain      Patients Stated Pain Goal: 6 (02/20/23 0904)  Complications: No notable events documented.

## 2023-02-20 NOTE — Op Note (Signed)
 [facility] Patient Name: Shelly [name] Procedure Date: 02/20/2023 11:10 AM MRN: [id number] Date of Birth: 1940/11/04 Attending MD: Lamar Ozell Hollingshead , MD, [id number] CSN: [id number] Age: 83 Admit Type: Outpatient Procedure:                Colonoscopy Indications:              Heme positive stool Providers:                Lamar Ozell Hollingshead, MD, Crystal Page, Kristine L.                            Shirlean Balm, Technician Referring MD:              Medicines:                Propofol  per Anesthesia Complications:            No immediate complications. Estimated Blood Loss:     Estimated blood loss was minimal. Procedure:                Pre-Anesthesia Assessment:                           - Prior to the procedure, a History and Physical                            was performed, and patient medications and                            allergies were reviewed. The patient's tolerance of                            previous anesthesia was also reviewed. The risks                            and benefits of the procedure and the sedation                            options and risks were discussed with the patient.                            All questions were answered, and informed consent                            was obtained. Prior Anticoagulants: The patient has                            taken no anticoagulant or antiplatelet agents. ASA                            Grade Assessment: III - A patient with severe                            systemic disease. After reviewing the risks and  benefits, the patient was deemed in satisfactory                            condition to undergo the procedure.                           After obtaining informed consent, the colonoscope                            was passed under direct vision. Throughout the                            procedure, the patient's blood pressure, pulse, and                            oxygen  saturations were monitored continuously. The                            [id number]) scope was introduced through                            the anus and advanced to the 5 cm into the ileum.                            The colonoscopy was performed without difficulty.                            The patient tolerated the procedure well. The                            quality of the bowel preparation was adequate. The                            entire colon was well visualized. Scope In: 11:21:54 AM Scope Out: 11:35:26 AM Scope Withdrawal Time: 0 hours 10 minutes 21 seconds  Total Procedure Duration: 0 hours 13 minutes 32 seconds  Findings:      The perianal and digital rectal examinations were normal.      Multiple large-mouthed and medium-mouthed diverticula were found in the       entire colon.      Two sessile polyps were found in the cecum. The polyps were 3 to 4 mm in       size. These polyps were removed with a cold snare. Resection and       retrieval were complete. Estimated blood loss was minimal.      Non-bleeding internal hemorrhoids were found during retroflexion. The       hemorrhoids were mild, small and Grade I (internal hemorrhoids that do       not prolapse).      The exam was otherwise without abnormality on direct and retroflexion       views. Distal 5 cm of TI appeared normal. Impression:               - Diverticulosis in the entire examined colon.                           -  Two 3 to 4 mm polyps in the cecum, removed with a                            cold snare. Resected and retrieved.                           - Non-bleeding internal hemorrhoids.                           - The examination was otherwise normal on direct                            and retroflexion views. Moderate Sedation:      Moderate (conscious) sedation was personally administered by an       anesthesia professional. The following parameters were monitored: oxygen       saturation, heart  rate, blood pressure, respiratory rate, EKG, adequacy       of pulmonary ventilation, and response to care. Recommendation:           -Follow-up on pathology. Proceed with the EGD per                            plan.. Procedure Code(s):        --- Professional ---                           [phone], Colonoscopy, flexible; with removal of                            tumor(s), polyp(s), or other lesion(s) by snare                            technique Diagnosis Code(s):        --- Professional ---                           K64.0, First degree hemorrhoids                           D12.0, Benign neoplasm of cecum                           R19.5, Other fecal abnormalities                           K57.30, Diverticulosis of large intestine without                            perforation or abscess without bleeding CPT copyright 2022 American Medical Association. All rights reserved. The codes documented in this report are preliminary and upon coder review may  be revised to meet current compliance requirements. Lamar HERO. Wade Sigala, MD Lamar Ozell Hollingshead, MD 02/20/2023 11:47:03 AM This report has been signed electronically. Number of Addenda: 0

## 2023-02-20 NOTE — H&P (Signed)
 @LOGO @   Primary Care Physician:  Cook, Jayce G, DO Primary Gastroenterologist:  Dr. Shaaron  Pre-Procedure History & Physical: HPI:  Shelly [name] is a 83 y.o. female here for   Further evaluation of microcytic anemia Hemoccult positive stool.  She is essentially devoid of any GI symptoms.  Clear no nausea or vomiting odynophagia or dysphagia.  Denies rectal bleeding or melena.  Per plan, we are going to perform a diagnostic colonoscopy today.  If negative, we will proceed with an EGD in the same setting.  Past Medical History:  Diagnosis Date   Arthritis    qwhere (07/01/2012)   Asthma    normal chest x-ray in 2010   Basal cell carcinoma of face    Breast cancer (HCC)    Cholelithiasis    Chronic lower back pain    Degenerative joint disease    Right TKA-2010; low back pain; hands and hips also affected   Exertional shortness of breath    mostly from being too fat (07/01/2012)   GERD (gastroesophageal reflux disease)    history   H/O hiatal hernia    Heart murmur    small   Hemorrhoid    HTN (hypertension)    Hyperlipidemia    Macular degeneration    both eyes (07/01/2012)   PUD (peptic ulcer disease) 2003   Upper GI bleed-gastric ulcer; gastroesophageal reflux disease   Seasonal allergies    Type II diabetes mellitus (HCC)     Past Surgical History:  Procedure Laterality Date   APPENDECTOMY     BREAST LUMPECTOMY Left    BREAST LUMPECTOMY WITH RADIOACTIVE SEED LOCALIZATION Left 10/21/2017   Procedure: BREAST LUMPECTOMY WITH RADIOACTIVE SEED LOCALIZATION;  Surgeon: Gail Favorite, MD;  Location: [facility];  Service: General;  Laterality: Left;   CATARACT EXTRACTION BILATERAL W/ ANTERIOR VITRECTOMY Bilateral    EYE SURGERY Bilateral    laser in eyes to stop bleeding and injections for macular degeneration (07/01/2012)   FOOT SURGERY Bilateral    straighten 1st toe with a wedge.   KNEE ARTHROSCOPY Bilateral    SKIN CANCER EXCISION  2013   face  (07/01/2012)   TOTAL KNEE ARTHROPLASTY Right 2011   TOTAL KNEE ARTHROPLASTY Left 07/01/2012   TOTAL KNEE ARTHROPLASTY Left 07/01/2012   Procedure: LEFT TOTAL KNEE ARTHROPLASTY;  Surgeon: Maude LELON Right, MD;  Location: [facility] OR;  Service: Orthopedics;  Laterality: Left;  Left Total Knee Arthroplasty   TOTAL KNEE REVISION Left 09/17/2022   Procedure: LEFT KNEE RESECTION ARTHROPLASTY, REVISION TOTAL KNEE ARTHROPLASTY;  Surgeon: Jerri [name] HERO, MD;  Location: [facility] OR;  Service: Orthopedics;  Laterality: Left;    Prior to Admission medications   Medication Sig Start Date End Date Taking? Authorizing Provider  acetaminophen  (TYLENOL ) 650 MG CR tablet Take 650 mg by mouth every 8 (eight) hours as needed for pain.   Yes [provider]  albuterol  (VENTOLIN  HFA) 108 (90 Base) MCG/ACT inhaler Inhale 2 puffs into the lungs every 6 (six) hours as needed for shortness of breath.   Yes [provider]  carvedilol  (COREG ) 3.125 MG tablet Take 1 tablet (3.125 mg total) by mouth 2 (two) times daily. Dose reduction per patientTake 3.125 mg by mouth 2 (two) times daily. Dose reduction per patient 12/17/22  Yes Cook, Jayce G, DO  cefadroxil  (DURICEF) 500 MG capsule Take 2 capsules (1,000 mg total) by mouth 2 (two) times daily. 11/01/22  Yes Luiz Channel, MD  cetirizine (ZYRTEC) 10 MG tablet  Take 10 mg by mouth daily as needed for allergies.   Yes [provider]  cholecalciferol (VITAMIN D3) 25 MCG (1000 UNIT) tablet Take 1,000 Units by mouth daily.   Yes [provider]  Cyanocobalamin  (B-12 COMPLIANCE INJECTION) 1000 MCG/ML KIT Inject 1 mL as directed every 30 (thirty) days. 04/17/22  Yes Cook, Jayce G, DO  cyclobenzaprine (FLEXERIL) 5 MG tablet Take 5 mg by mouth 3 (three) times daily as needed for muscle spasms.   Yes [provider]  Iron , Ferrous Sulfate , 325 (65 Fe) MG TABS Take 325 mg by mouth every other day. 11/16/22  Yes Cook, Jayce G, DO  Multiple Vitamins-Minerals  (PRESERVISION AREDS PO) Take 1 tablet by mouth 2 (two) times daily.   Yes [provider]  nystatin  (MYCOSTATIN /NYSTOP ) powder Apply 1 Application topically 2 (two) times daily. 02/11/23  Yes Cook, Jayce G, DO  pravastatin  (PRAVACHOL ) 10 MG tablet Take 1 tablet (10 mg total) by mouth every evening. Take 10 mg by mouth every evening. 12/17/22  Yes Cook, Jayce G, DO  sertraline  (ZOLOFT ) 100 MG tablet Take 1 tablet (100 mg total) by mouth daily. 11/16/22  Yes Cook, Jayce G, DO  valsartan  (DIOVAN ) 80 MG tablet Take 1 tablet (80 mg total) by mouth daily. 07/17/22  Yes Cook, Jayce G, DO  fluconazole  (DIFLUCAN ) 150 MG tablet Take 1 tablet (150 mg total) by mouth once a week. 12/17/22   Cook, Jayce G, DO  lidocaine  (XYLOCAINE ) 5 % ointment Apply 1 Application topically 3 (three) times daily as needed. 05/21/22   Raspet, Erin K, PA-C  Sod Picosulfate-Mag Ox-Cit Acd (CLENPIQ ) 10-3.5-12 MG-GM -GM/175ML SOLN Take 1 kit by mouth as directed. 01/15/23   Shaaron Lamar HERO, MD    Allergies as of 01/15/2023 - Review Complete 12/19/2022  Allergen Reaction Noted   Celebrex [celecoxib] Other (See Comments) 08/22/2010   Claritin [loratadine] Hives and Swelling 06/20/2012   Penicillins Hives 12/07/2008   Percocet [oxycodone -acetaminophen ] Other (See Comments) 10/21/2017    Family History  Problem Relation Age of Onset   Hodgkin's lymphoma Mother    Cervical cancer Sister        half sister mom's side   Lung cancer Sister    Colon cancer Sister    Breast cancer Neg Hx     Social History   Socioeconomic History   Marital status: Divorced    Spouse name: Not on file   Number of children: 2   Years of education: Not on file   Highest education level: Some college, no degree  Occupational History   Not on file  Tobacco Use   Smoking status: Former    Current packs/day: 0.00    Average packs/day: 2.5 packs/day for 27.0 years (67.5 ttl pk-yrs)    Types: Cigarettes    Start date: 02/12/1954    Quit date:  02/12/1981    Years since quitting: 42.0   Smokeless tobacco: Never  Vaping Use   Vaping status: Never Used  Substance and Sexual Activity   Alcohol use: No   Drug use: No   Sexual activity: Not Currently  Other Topics Concern   Not on file  Social History Narrative   Lives locally. Works part time at the [employer].    Social Drivers of Corporate Investment Banker Strain: Low Risk  (12/17/2022)   Overall Financial Resource Strain (CARDIA)    Difficulty of Paying Living Expenses: Not very hard  Food Insecurity: No Food Insecurity (  12/17/2022)   Hunger Vital Sign    Worried About Running Out of Food in the Last Year: Never true    Ran Out of Food in the Last Year: Never true  Transportation Needs: No Transportation Needs (12/17/2022)   PRAPARE - Administrator, Civil Service (Medical): No    Lack of Transportation (Non-Medical): No  Physical Activity: Unknown (12/17/2022)   Exercise Vital Sign    Days of Exercise per Week: Patient declined    Minutes of Exercise per Session: 0 min  Recent Concern: Physical Activity - Inactive (11/09/2022)   Exercise Vital Sign    Days of Exercise per Week: 0 days    Minutes of Exercise per Session: 0 min  Stress: No Stress Concern Present (12/17/2022)   [employer] of Occupational Health - Occupational Stress Questionnaire    Feeling of Stress : Only a little  Social Connections: Moderately Isolated (12/17/2022)   Social Connection and Isolation Panel [NHANES]    Frequency of Communication with Friends and Family: More than three times a week    Frequency of Social Gatherings with Friends and Family: Three times a week    Attends Religious Services: 1 to 4 times per year    Active Member of Clubs or Organizations: No    Attends Banker Meetings: Never    Marital Status: Divorced  Catering Manager Violence: Not At Risk (11/09/2022)   Humiliation, Afraid, Rape, and Kick questionnaire    Fear  of Current or Ex-Partner: No    Emotionally Abused: No    Physically Abused: No    Sexually Abused: No    Review of Systems: See HPI, otherwise negative ROS  Physical Exam: BP (!) 156/81   Pulse 80   Temp 97.7 F (36.5 C) (Oral)   Resp 17   SpO2 97%  General:   Alert,  Well-developed, well-nourished, pleasant and cooperative in NAD Neck:  Supple; no masses or thyromegaly. No significant cervical adenopathy. Lungs:  Clear throughout to auscultation.   No wheezes, crackles, or rhonchi. No acute distress. Heart:  Regular rate and rhythm; no murmurs, clicks, rubs,  or gallops. Abdomen: Non-distended, normal bowel sounds.  Soft and nontender without appreciable mass or hepatosplenomegaly.   Impression/Plan:    83 year old lady with microcytic anemia occult blood positive stool.  No overt bleeding and no focal GI symptoms.  To further evaluate, follow-up with the patient a diagnostic colonoscopy today.  If unrevealing as to the cause of Hemoccult positive stool, we will proceed with a diagnostic EGD per plan. The risks, benefits, limitations, imponderables and alternatives regarding both EGD and colonoscopy have been reviewed with the patient. Questions have been answered. All parties agreeable.       Notice: This dictation was prepared with Dragon dictation along with smaller phrase technology. Any transcriptional errors that result from this process are unintentional and may not be corrected upon review.

## 2023-02-20 NOTE — Discharge Instructions (Addendum)
 EGD Discharge instructions Please read the instructions outlined below and refer to this sheet in the next few weeks. These discharge instructions provide you with general information on caring for yourself after you leave the hospital. Your doctor may also give you specific instructions. While your treatment has been planned according to the most current medical practices available, unavoidable complications occasionally occur. If you have any problems or questions after discharge, please call your doctor. ACTIVITY You may resume your regular activity but move at a slower pace for the next 24 hours.  Take frequent rest periods for the next 24 hours.  Walking will help expel (get rid of) the air and reduce the bloated feeling in your abdomen.  No driving for 24 hours (because of the anesthesia (medicine) used during the test).  You may shower.  Do not sign any important legal documents or operate any machinery for 24 hours (because of the anesthesia used during the test).  NUTRITION Drink plenty of fluids.  You may resume your normal diet.  Begin with a light meal and progress to your normal diet.  Avoid alcoholic beverages for 24 hours or as instructed by your caregiver.  MEDICATIONS You may resume your normal medications unless your caregiver tells you otherwise.  WHAT YOU CAN EXPECT TODAY You may experience abdominal discomfort such as a feeling of fullness or "gas" pains.  FOLLOW-UP Your doctor will discuss the results of your test with you.  SEEK IMMEDIATE MEDICAL ATTENTION IF ANY OF THE FOLLOWING OCCUR: Excessive nausea (feeling sick to your stomach) and/or vomiting.  Severe abdominal pain and distention (swelling).  Trouble swallowing.  Temperature over 101 F (37.8 C).  Rectal bleeding or vomiting of blood.      Colonoscopy Discharge Instructions  Read the instructions outlined below and refer to this sheet in the next few weeks. These discharge instructions provide you  with general information on caring for yourself after you leave the hospital. Your doctor may also give you specific instructions. While your treatment has been planned according to the most current medical practices available, unavoidable complications occasionally occur. If you have any problems or questions after discharge, call Dr. Shaaron at [phone]. ACTIVITY You may resume your regular activity, but move at a slower pace for the next 24 hours.  Take frequent rest periods for the next 24 hours.  Walking will help get rid of the air and reduce the bloated feeling in your belly (abdomen).  No driving for 24 hours (because of the medicine (anesthesia) used during the test).   Do not sign any important legal documents or operate any machinery for 24 hours (because of the anesthesia used during the test).  NUTRITION Drink plenty of fluids.  You may resume your normal diet as instructed by your doctor.  Begin with a light meal and progress to your normal diet. Heavy or fried foods are harder to digest and may make you feel sick to your stomach (nauseated).  Avoid alcoholic beverages for 24 hours or as instructed.  MEDICATIONS You may resume your normal medications unless your doctor tells you otherwise.  WHAT YOU CAN EXPECT TODAY Some feelings of bloating in the abdomen.  Passage of more gas than usual.  Spotting of blood in your stool or on the toilet paper.  IF YOU HAD POLYPS REMOVED DURING THE COLONOSCOPY: No aspirin  products for 7 days or as instructed.  No alcohol for 7 days or as instructed.  Eat a soft diet for the next 24 hours.  FINDING OUT THE RESULTS OF YOUR TEST Not all test results are available during your visit. If your test results are not back during the visit, make an appointment with your caregiver to find out the results. Do not assume everything is normal if you have not heard from your caregiver or the medical facility. It is important for you to follow up on all of your  test results.  SEEK IMMEDIATE MEDICAL ATTENTION IF: You have more than a spotting of blood in your stool.  Your belly is swollen (abdominal distention).  You are nauseated or vomiting.  You have a temperature over 101.  You have abdominal pain or discomfort that is severe or gets worse throughout the day.     EGD performed.  Inflammation in your stomach noted.  Biopsies taken.    2 small polyps found in your colon and removed  Diverticulosis information  provided.    Further recommendations to follow pending review of pathology report   at patient request, called Shelly [name] grandson - [phone] -  reviewed findings and recommendations

## 2023-02-21 LAB — SURGICAL PATHOLOGY

## 2023-02-22 ENCOUNTER — Encounter: Payer: Self-pay | Admitting: Internal Medicine

## 2023-02-22 ENCOUNTER — Encounter (HOSPITAL_COMMUNITY): Payer: Self-pay | Admitting: Internal Medicine

## 2023-02-22 NOTE — Anesthesia Postprocedure Evaluation (Signed)
 Anesthesia Post Note  Patient: PEARLE WANDLER  Procedure(s) Performed: COLONOSCOPY WITH PROPOFOL  ESOPHAGOGASTRODUODENOSCOPY (EGD) WITH PROPOFOL  POLYPECTOMY BIOPSY  Patient location during evaluation: Phase II Anesthesia Type: General Level of consciousness: awake Pain management: pain level controlled Vital Signs Assessment: post-procedure vital signs reviewed and stable Respiratory status: spontaneous breathing and respiratory function stable Cardiovascular status: blood pressure returned to baseline and stable Postop Assessment: no headache and no apparent nausea or vomiting Anesthetic complications: no Comments: Late entry   No notable events documented.   Last Vitals:  Vitals:   02/20/23 0904 02/20/23 1152  BP: (!) 156/81 (!) 116/52  Pulse: 80 75  Resp: 17 19  Temp: 36.5 C (!) 36.3 C  SpO2: 97% 99%    Last Pain:  Vitals:   02/21/23 1206  TempSrc:   PainSc: 0-No pain                 Yvonna JINNY Bosworth

## 2023-03-10 ENCOUNTER — Other Ambulatory Visit: Payer: Self-pay | Admitting: Internal Medicine

## 2023-03-11 NOTE — Telephone Encounter (Signed)
Appt 1/29

## 2023-03-13 ENCOUNTER — Ambulatory Visit: Payer: PPO | Admitting: Internal Medicine

## 2023-03-13 ENCOUNTER — Ambulatory Visit: Payer: PPO | Admitting: Orthopaedic Surgery

## 2023-03-19 ENCOUNTER — Encounter: Payer: Self-pay | Admitting: Hematology

## 2023-03-19 ENCOUNTER — Ambulatory Visit: Payer: PPO | Admitting: Family Medicine

## 2023-03-25 NOTE — Progress Notes (Signed)
Office Visit Note   Patient: Shelly [name]           Date of Birth: 05/22/1940           MRN: [id number] Visit Date: 03/26/2023              Requested by: Tommie Sams, DO [address] [place],  [place] [address] PCP: Tommie Sams, DO   Assessment & Plan: Visit Diagnoses:  1. Status post revision of total replacement of left knee     Plan: Shelly [name] is now 6 months postop.  She is functioning well overall.  Uses a rollator for ADLs.  She has no complaints.  She has an upcoming appointment with Dr. Ilsa Iha to talk about chronic suppression.  Recheck in 6 months with repeat radiographs of the left knee.  Follow-Up Instructions: Return in about 6 months (around 09/23/2023).   Orders:  Orders Placed This Encounter  Procedures   XR Knee 1-2 Views Left   No orders of the defined types were placed in this encounter.     Procedures: No procedures performed   Clinical Data: No additional findings.   Subjective: Chief Complaint  Patient presents with   Left Knee - Follow-up    HPI Shelly [name] is a 83 year old female who is 6 months postop.  She comes in for follow-up appointment.  She is doing well overall. Review of Systems   Objective: Vital Signs: There were no vitals taken for this visit.  Physical Exam  Ortho Exam Exam of the left knee shows fully healed surgical scar.  Functional range of motion.  Collaterals are stable. Specialty Comments:  No specialty comments available.  Imaging: XR Knee 1-2 Views Left Result Date: 03/26/2023 X-rays of the left knee show stable cemented femoral component with a cemented all poly tibial component.    PMFS History: Patient Active Problem List   Diagnosis Date Noted   Heme positive stool 12/19/2022   Intertrigo 12/17/2022   Iron deficiency anemia 11/18/2022   Status post revision of total replacement of left knee 09/17/2022   Infection of prosthetic left knee joint (HCC) 09/16/2022   Chronic pain of right wrist  07/18/2022   Anxiety and depression 06/04/2022   Memory changes 06/04/2022   B12 deficiency 04/17/2022   CKD (chronic kidney disease) stage 3, GFR 30-59 ml/min (HCC) 04/16/2022   Legally blind 04/16/2022   Malignant neoplasm of upper-outer quadrant of left breast in female, estrogen receptor positive (HCC) 09/18/2017   Hyperlipidemia    Diabetes mellitus (HCC)    PUD (peptic ulcer disease)    Essential hypertension 12/07/2008   Past Medical History:  Diagnosis Date   Arthritis    "qwhere" (07/01/2012)   Asthma    normal chest x-ray in 2010   Basal cell carcinoma of face    Breast cancer (HCC)    Cholelithiasis    Chronic lower back pain    Degenerative joint disease    Right TKA-2010; low back pain; hands and hips also affected   Exertional shortness of breath    "mostly from being too fat" (07/01/2012)   GERD (gastroesophageal reflux disease)    history   H/O hiatal hernia    Heart murmur    small   Hemorrhoid    HTN (hypertension)    Hyperlipidemia    Macular degeneration    "both eyes" (07/01/2012)   PUD (peptic ulcer disease) 2003   Upper GI bleed-gastric ulcer; gastroesophageal reflux disease  Seasonal allergies    Type II diabetes mellitus (HCC)     Family History  Problem Relation Age of Onset   Hodgkin's lymphoma Mother    Cervical cancer Sister        half sister mom's side   Lung cancer Sister    Colon cancer Sister    Breast cancer Neg Hx     Past Surgical History:  Procedure Laterality Date   APPENDECTOMY     BIOPSY  02/20/2023   Procedure: BIOPSY;  Surgeon: Corbin Ade, MD;  [facility]: [facility];  Service: Endoscopy;;   BREAST LUMPECTOMY Left    BREAST LUMPECTOMY WITH RADIOACTIVE SEED LOCALIZATION Left 10/21/2017   Procedure: BREAST LUMPECTOMY WITH RADIOACTIVE SEED LOCALIZATION;  Surgeon: Claud Kelp, MD;  Location: [facility];  Service: General;  Laterality: Left;   CATARACT EXTRACTION BILATERAL W/ ANTERIOR VITRECTOMY  Bilateral    COLONOSCOPY WITH PROPOFOL N/A 02/20/2023   Procedure: COLONOSCOPY WITH PROPOFOL;  Surgeon: Corbin Ade, MD;  [facility]: [facility];  Service: Endoscopy;  Laterality: N/A;  10:00am, asa 3   ESOPHAGOGASTRODUODENOSCOPY (EGD) WITH PROPOFOL N/A 02/20/2023   Procedure: ESOPHAGOGASTRODUODENOSCOPY (EGD) WITH PROPOFOL;  Surgeon: Corbin Ade, MD;  [facility];  Service: Endoscopy;  Laterality: N/A;   EYE SURGERY Bilateral    "laser in eyes to stop bleeding and injections for macular degeneration" (07/01/2012)   FOOT SURGERY Bilateral    straighten 1st toe with a wedge.   KNEE ARTHROSCOPY Bilateral    POLYPECTOMY  02/20/2023   Procedure: POLYPECTOMY;  Surgeon: Corbin Ade, MD;  [facility]: [facility];  Service: Endoscopy;;   SKIN CANCER EXCISION  2013   "face" (07/01/2012)   TOTAL KNEE ARTHROPLASTY Right 2011   TOTAL KNEE ARTHROPLASTY Left 07/01/2012   TOTAL KNEE ARTHROPLASTY Left 07/01/2012   Procedure: LEFT TOTAL KNEE ARTHROPLASTY;  Surgeon: Valeria Batman, MD;  Location: [facility] OR;  Service: Orthopedics;  Laterality: Left;  Left Total Knee Arthroplasty   TOTAL KNEE REVISION Left 09/17/2022   Procedure: LEFT KNEE RESECTION ARTHROPLASTY, REVISION TOTAL KNEE ARTHROPLASTY;  Surgeon: Tarry Kos, MD;  Location: [facility];  Service: Orthopedics;  Laterality: Left;   Social History   Occupational History   Not on file  Tobacco Use   Smoking status: Former    Current packs/day: 0.00    Average packs/day: 2.5 packs/day for 27.0 years (67.5 ttl pk-yrs)    Types: Cigarettes    Start date: 02/12/1954    Quit date: 02/12/1981    Years since quitting: 42.1   Smokeless tobacco: Never  Vaping Use   Vaping status: Never Used  Substance and Sexual Activity   Alcohol use: No   Drug use: No   Sexual activity: Not Currently

## 2023-03-26 ENCOUNTER — Other Ambulatory Visit (INDEPENDENT_AMBULATORY_CARE_PROVIDER_SITE_OTHER): Payer: HMO

## 2023-03-26 ENCOUNTER — Encounter: Payer: Self-pay | Admitting: Hematology

## 2023-03-26 ENCOUNTER — Ambulatory Visit: Payer: PPO | Admitting: Orthopaedic Surgery

## 2023-03-26 DIAGNOSIS — Z96652 Presence of left artificial knee joint: Secondary | ICD-10-CM

## 2023-04-03 DIAGNOSIS — H04123 Dry eye syndrome of bilateral lacrimal glands: Secondary | ICD-10-CM | POA: Diagnosis not present

## 2023-05-02 ENCOUNTER — Telehealth: Payer: Self-pay

## 2023-05-02 ENCOUNTER — Encounter: Payer: Self-pay | Admitting: Physician Assistant

## 2023-05-02 DIAGNOSIS — T8454XD Infection and inflammatory reaction due to internal left knee prosthesis, subsequent encounter: Secondary | ICD-10-CM

## 2023-05-02 NOTE — Addendum Note (Signed)
 Addended by: Juanita Laster on: 05/02/2023 02:53 PM   Modules accepted: Orders

## 2023-05-02 NOTE — Telephone Encounter (Signed)
 Called pt's daughter Milagros Loll in response to MyChart appt req to reschedule f/u from Jan. 2025. She is coordinating with her nephew to assist with bringing pt to her appt.   She asked if it was possible if she could complete any labs that may be needed for her visit when she goes to [facility] on 05/14/2023 (see appt desk). Asked if orders could just be sent there for draw.   She did express as well if in the future if she could coordinate doing MyChart VV as well due to some of the troubles they run into coordinating her transportation.

## 2023-05-02 NOTE — Telephone Encounter (Signed)
 Per Dr. Drue Second just sed rate and crp. Will enter lab orders and send message to pt on mychart. Juanita Laster, RMA

## 2023-05-04 ENCOUNTER — Other Ambulatory Visit: Payer: Self-pay | Admitting: Internal Medicine

## 2023-05-06 ENCOUNTER — Inpatient Hospital Stay: Payer: PPO

## 2023-05-06 ENCOUNTER — Inpatient Hospital Stay: Payer: PPO | Admitting: Physician Assistant

## 2023-05-06 NOTE — Telephone Encounter (Signed)
 Secure chat sent to Dr.Snider for clarification.

## 2023-05-13 NOTE — Progress Notes (Unsigned)
 [facility] [address], [place] [address]   CLINIC:  Medical Oncology/Hematology  PCP:  Tommie Sams, DO [address] [place] [address] [phone]   REASON FOR VISIT:  Follow-up for iron deficient anemia + anemia of CKD  CURRENT THERAPY: Intermittent IV iron (most recently IV Monoferric 1000 mg on 12/14/2022)  INTERVAL HISTORY:   Ms. Kinker 83 y.o. female returns for routine follow-up of normocytic anemia second iron deficiency and CKD.  She was last seen by Rojelio Brenner PA-C on 02/01/2023.  At today's visit, she reports feeling fairly well.  No recent hospitalizations, surgeries, or changes in baseline health status.  She was treated with IV antibiotics for left knee MSSA PJI from August 2024 through February 2025.  She continues to follow with infectious disease, and takes Cefadroxil for ongoing infection suppression.   She reports occasional positional lightheadedness without syncope.  She denies any abnormal fatigue, pica, headaches, or chest pain.  She has baseline dyspnea on exertion and cough, which she relates to her underlying asthma.  She denies any recent rectal bleeding or melena.  She has 70% energy and 100% appetite. She endorses that she is maintaining a stable weight.  ASSESSMENT & PLAN:  1.  Normocytic anemia, secondary to iron deficiency and CKD - Patient seen at the request of Dr. Adriana Simas, due to Hgb from 09/21/2022 as low as 7.9, ferritin 10 - No history of blood transfusions. - Hematology workup showed normal SPEP/immunofixation with minimally elevated FLC ratio in the setting of CKD.  Vitamin B12 normal, but MMA elevated at 594.  Normal folate and copper. - She has CKD stage IIIa/b, following with Dr. Wolfgang Phoenix. - She receives monthly B12 injections at home, given by her daughter - Iron levels failed to improve despite oral iron supplementation.  Received IV Monoferric 12/14/2022. - FOBT positive per PCP office on  12/06/2022. - EGD/colonoscopy (02/20/2023): Normal esophagus and duodenum.  Gastric hemorrhagic erosions (biopsy showed mild nonspecific reactive gastropathy).  Colon with diverticulosis, polyps x 2 (tubular adenomas), nonbleeding internal hemorrhoids. - She reports occasional dark stools, but denies any bright red blood per rectum.   - Most recent labs (05/14/2023): Hgb 13.3/MCV 89.0, ferritin 168, iron saturation 28% -DIFFERENTIAL DIAGNOSIS favors anemia combination of CKD and iron deficiency  - PLAN: No indication for IV iron at this time. - Continue daily iron supplement ("Flintstones Multivitamin with Iron") - Labs (CBC/D, BMP, ferritin, iron/TIBC) + RTC in 6 months - Continue monthly B12 injections at home (recheck B12/MMA at follow-up in 6 months)  - Due to minimally elevated FLC ratio, check 24-hour urine/UPEP.  We will repeat MGUS/myeloma panel 1 year after original labs were checked (due around December 2025) to rule out any developing plasma cell disorder.    2.  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 Pamelia Hoit.  Most recently seen by him on 03/13/2022, with recommendations for as needed follow-up only. - Annual mammograms and breast exams via PCP  3.  Social/family history: - She lives by herself at  home.  She is legally blind and cannot drive.  Quit smoking at age 57. - Mother had cancer, type not known to the patient.  Sister had smoldering myeloma.  PLAN SUMMARY:  >> Labs in 6 months = CBC/D, BMP, ferritin, iron/TIBC, B12, MMA  >> OFFICE visit in 6 months (1 week after labs)     REVIEW OF SYSTEMS:   Review of Systems  Constitutional:  Negative for appetite change, chills, diaphoresis, fatigue, fever and unexpected weight change.  HENT:   Negative for lump/mass and nosebleeds.   Eyes:  Negative for eye problems.  Respiratory:  Positive for cough and shortness of breath (asthma). Negative for hemoptysis.   Cardiovascular:  Negative for chest pain, leg swelling and palpitations.  Gastrointestinal:  Negative for abdominal pain, blood in stool, constipation, diarrhea, nausea and vomiting.  Genitourinary:  Negative for hematuria.   Musculoskeletal:  Positive for arthralgias.  Skin: Negative.   Neurological:  Positive for light-headedness. Negative for dizziness and headaches.  Hematological:  Does not bruise/bleed easily.  Psychiatric/Behavioral:  The patient is nervous/anxious.      PHYSICAL EXAM:  ECOG PERFORMANCE STATUS: 2 - Symptomatic, <50% confined to bed  Vitals:   05/14/23 1358  BP: 137/64  Pulse: 72  Resp: 18  Temp: 98 F (36.7 C)  SpO2: 94%    Physical Exam Constitutional:      Appearance: Normal appearance. She is obese.  Cardiovascular:     Heart sounds: Normal heart sounds.  Pulmonary:     Breath sounds: Normal breath sounds.  Neurological:     General: No focal deficit present.     Mental Status: Mental status is at baseline.  Psychiatric:        Behavior: Behavior normal. Behavior is cooperative.     PAST MEDICAL/SURGICAL HISTORY:  Past Medical History:  Diagnosis Date   Arthritis    "qwhere" (07/01/2012)   Asthma    normal chest x-ray in 2010   Basal cell carcinoma of face    Breast cancer (HCC)    Cholelithiasis    Chronic lower back  pain    Degenerative joint disease    Right TKA-2010; low back pain; hands and hips also affected   Exertional shortness of breath    "mostly from being too fat" (07/01/2012)   GERD (gastroesophageal reflux disease)    history   H/O hiatal hernia    Heart murmur    small   Hemorrhoid    HTN (hypertension)    Hyperlipidemia    Macular degeneration    "both eyes" (07/01/2012)   PUD (peptic ulcer disease) 2003   Upper GI bleed-gastric ulcer; gastroesophageal reflux disease   Seasonal allergies    Type II diabetes mellitus (HCC)    Past Surgical History:  Procedure Laterality Date   APPENDECTOMY     BIOPSY  02/20/2023   Procedure: BIOPSY;  Surgeon: Corbin Ade, MD;  [facility]: [facility];  Service: Endoscopy;;   BREAST LUMPECTOMY Left    BREAST LUMPECTOMY WITH RADIOACTIVE SEED LOCALIZATION Left 10/21/2017   Procedure: BREAST LUMPECTOMY WITH RADIOACTIVE SEED LOCALIZATION;  Surgeon: Claud Kelp, MD;  Location: [facility];  Service: General;  Laterality: Left;   CATARACT EXTRACTION BILATERAL W/ ANTERIOR VITRECTOMY  Bilateral    COLONOSCOPY WITH PROPOFOL N/A 02/20/2023   Procedure: COLONOSCOPY WITH PROPOFOL;  Surgeon: Corbin Ade, MD;  [facility]: [facility];  Service: Endoscopy;  Laterality: N/A;  10:00am, asa 3   ESOPHAGOGASTRODUODENOSCOPY (EGD) WITH PROPOFOL N/A 02/20/2023   Procedure: ESOPHAGOGASTRODUODENOSCOPY (EGD) WITH PROPOFOL;  Surgeon: Corbin Ade, MD;  [facility]: [facility];  Service: Endoscopy;  Laterality: N/A;   EYE SURGERY Bilateral    "laser in eyes to stop bleeding and injections for macular degeneration" (07/01/2012)   FOOT SURGERY Bilateral    straighten 1st toe with a wedge.   KNEE ARTHROSCOPY Bilateral    POLYPECTOMY  02/20/2023   Procedure: POLYPECTOMY;  Surgeon: Corbin Ade, MD;  [facility]: [facility];  Service: Endoscopy;;   SKIN CANCER EXCISION  2013   "face" (07/01/2012)   TOTAL KNEE ARTHROPLASTY Right 2011   TOTAL KNEE  ARTHROPLASTY Left 07/01/2012   TOTAL KNEE ARTHROPLASTY Left 07/01/2012   Procedure: LEFT TOTAL KNEE ARTHROPLASTY;  Surgeon: Valeria Batman, MD;  Location[facility];  Service: Orthopedics;  Laterality: Left;  Left Total Knee Arthroplasty   TOTAL KNEE REVISION Left 09/17/2022   Procedure: LEFT KNEE RESECTION ARTHROPLASTY, REVISION TOTAL KNEE ARTHROPLASTY;  Surgeon: Tarry Kos, MD;  Location: [facility];  Service: Orthopedics;  Laterality: Left;    SOCIAL HISTORY:  Social History   Socioeconomic History   Marital status: Divorced    Spouse name: Not on file   Number of children: 2   Years of education: Not on file   Highest education level: Some college, no degree  Occupational History   Not on file  Tobacco Use   Smoking status: Former    Current packs/day: 0.00    Average packs/day: 2.5 packs/day for 27.0 years (67.5 ttl pk-yrs)    Types: Cigarettes    Start date: 02/12/1954    Quit date: 02/12/1981    Years since quitting: 42.2   Smokeless tobacco: Never  Vaping Use   Vaping status: Never Used  Substance and Sexual Activity   Alcohol use: No   Drug use: No   Sexual activity: Not Currently  Other Topics Concern   Not on file  Social History Narrative   Lives locally. Works part time at the [employer].    Social Drivers of Corporate investment banker Strain: Low Risk  (12/17/2022)   Overall Financial Resource Strain (CARDIA)    Difficulty of Paying Living Expenses: Not very hard  Food Insecurity: No Food Insecurity (12/17/2022)   Hunger Vital Sign    Worried About Running Out of Food in the Last Year: Never true    Ran Out of Food in the Last Year: Never true  Transportation Needs: No Transportation Needs (12/17/2022)   PRAPARE - Administrator, Civil Service (Medical): No    Lack of Transportation (Non-Medical): No  Physical Activity: Unknown (12/17/2022)   Exercise Vital Sign    Days of Exercise per Week: Patient declined    Minutes of  Exercise per Session: 0 min  Recent Concern: Physical Activity - Inactive (11/09/2022)   Exercise Vital Sign    Days of Exercise per Week: 0 days    Minutes of Exercise per Session: 0 min  Stress: No Stress Concern Present (12/17/2022)   [employer] of Occupational Health - Occupational Stress Questionnaire    Feeling of Stress : Only a little  Social Connections: Moderately Isolated (12/17/2022)   Social Connection and  Isolation Panel [NHANES]    Frequency of Communication with Friends and Family: More than three times a week    Frequency of Social Gatherings with Friends and Family: Three times a week    Attends Religious Services: 1 to 4 times per year    Active Member of Clubs or Organizations: No    Attends Banker Meetings: Never    Marital Status: Divorced  Catering manager Violence: Not At Risk (11/09/2022)   Humiliation, Afraid, Rape, and Kick questionnaire    Fear of Current or Ex-Partner: No    Emotionally Abused: No    Physically Abused: No    Sexually Abused: No    FAMILY HISTORY:  Family History  Problem Relation Age of Onset   Hodgkin's lymphoma Mother    Cervical cancer Sister        half sister mom's side   Lung cancer Sister    Colon cancer Sister    Breast cancer Neg Hx     CURRENT MEDICATIONS:  Outpatient Encounter Medications as of 05/14/2023  Medication Sig   acetaminophen (TYLENOL) 650 MG CR tablet Take 650 mg by mouth every 8 (eight) hours as needed for pain.   albuterol (VENTOLIN HFA) 108 (90 Base) MCG/ACT inhaler Inhale 2 puffs into the lungs every 6 (six) hours as needed for shortness of breath.   carvedilol (COREG) 3.125 MG tablet Take 1 tablet (3.125 mg total) by mouth 2 (two) times daily. Dose reduction per patientTake 3.125 mg by mouth 2 (two) times daily. Dose reduction per patient   cefadroxil (DURICEF) 500 MG capsule Take 2 capsules (1,000 mg total) by mouth 2 (two) times daily.   cetirizine (ZYRTEC) 10 MG tablet Take 10 mg by  mouth daily as needed for allergies.   cholecalciferol (VITAMIN D3) 25 MCG (1000 UNIT) tablet Take 1,000 Units by mouth daily.   Cyanocobalamin (B-12 COMPLIANCE INJECTION) 1000 MCG/ML KIT Inject 1 mL as directed every 30 (thirty) days.   cyclobenzaprine (FLEXERIL) 5 MG tablet Take 5 mg by mouth 3 (three) times daily as needed for muscle spasms.   fluconazole (DIFLUCAN) 150 MG tablet Take 1 tablet (150 mg total) by mouth once a week.   Iron, Ferrous Sulfate, 325 (65 Fe) MG TABS Take 325 mg by mouth every other day.   lidocaine (XYLOCAINE) 5 % ointment Apply 1 Application topically 3 (three) times daily as needed.   Multiple Vitamins-Minerals (PRESERVISION AREDS PO) Take 1 tablet by mouth 2 (two) times daily.   nystatin (MYCOSTATIN/NYSTOP) powder Apply 1 Application topically 2 (two) times daily.   pravastatin (PRAVACHOL) 10 MG tablet Take 1 tablet (10 mg total) by mouth every evening. Take 10 mg by mouth every evening.   sertraline (ZOLOFT) 100 MG tablet Take 1 tablet (100 mg total) by mouth daily.   Sod Picosulfate-Mag Ox-Cit Acd (CLENPIQ) 10-3.5-12 MG-GM -GM/175ML SOLN Take 1 kit by mouth as directed.   valsartan (DIOVAN) 80 MG tablet Take 1 tablet (80 mg total) by mouth daily.   No facility-administered encounter medications on file as of 05/14/2023.    ALLERGIES:  Allergies  Allergen Reactions   Celebrex [Celecoxib] Other (See Comments)    Abdominal pain; history of upper GI bleed    Claritin [Loratadine] Hives and Swelling    blisters   Penicillins Hives   Percocet [Oxycodone-Acetaminophen] Other (See Comments)    Severe constipation    LABORATORY DATA:  I have reviewed the labs as listed.  CBC    Component Value Date/Time  WBC 6.3 05/14/2023 1259   RBC 4.92 05/14/2023 1259   HGB 13.3 05/14/2023 1259   HGB 13.5 04/16/2022 1600   HCT 43.8 05/14/2023 1259   HCT 43.5 04/16/2022 1600   PLT 211 05/14/2023 1259   PLT 272 04/16/2022 1600   MCV 89.0 05/14/2023 1259   MCV 84  04/16/2022 1600   MCH 27.0 05/14/2023 1259   MCHC 30.4 05/14/2023 1259   RDW 14.1 05/14/2023 1259   RDW 13.3 04/16/2022 1600   LYMPHSABS 0.9 05/14/2023 1259   MONOABS 0.5 05/14/2023 1259   EOSABS 0.2 05/14/2023 1259   BASOSABS 0.1 05/14/2023 1259      Latest Ref Rng & Units 02/15/2023   11:22 AM 09/21/2022    9:40 AM 09/20/2022    3:15 AM  CMP  Glucose 70 - 99 mg/dL 409  811  54   BUN 8 - 23 mg/dL 48  34  50   Creatinine 0.44 - 1.00 mg/dL 9.14  7.82  9.56   Sodium 135 - 145 mmol/L 136  135  136   Potassium 3.5 - 5.1 mmol/L 5.1  4.9  5.1   Chloride 98 - 111 mmol/L 104  104  106   CO2 22 - 32 mmol/L 26  25  24    Calcium 8.9 - 10.3 mg/dL 8.8  8.2  8.2     DIAGNOSTIC IMAGING:  I have independently reviewed the relevant imaging and discussed with the patient.   WRAP UP:  All questions were answered. The patient knows to call the clinic with any problems, questions or concerns.  Medical decision making: Low  Time spent on visit: I spent 15 minutes counseling the patient face to face. The total time spent in the appointment was 22 minutes and more than 50% was on counseling.  Carnella Guadalajara, PA-C  05/14/23 3:57 PM

## 2023-05-14 ENCOUNTER — Inpatient Hospital Stay: Payer: Self-pay | Admitting: Physician Assistant

## 2023-05-14 ENCOUNTER — Inpatient Hospital Stay: Payer: Self-pay | Attending: Physician Assistant | Admitting: Oncology

## 2023-05-14 ENCOUNTER — Ambulatory Visit (INDEPENDENT_AMBULATORY_CARE_PROVIDER_SITE_OTHER): Admitting: Family Medicine

## 2023-05-14 VITALS — BP 138/67 | HR 70 | Temp 98.1°F | Ht 63.0 in | Wt 221.4 lb

## 2023-05-14 VITALS — BP 137/64 | HR 72 | Temp 98.0°F | Resp 18 | Wt 222.0 lb

## 2023-05-14 DIAGNOSIS — Z801 Family history of malignant neoplasm of trachea, bronchus and lung: Secondary | ICD-10-CM | POA: Diagnosis not present

## 2023-05-14 DIAGNOSIS — Z87891 Personal history of nicotine dependence: Secondary | ICD-10-CM | POA: Insufficient documentation

## 2023-05-14 DIAGNOSIS — E1122 Type 2 diabetes mellitus with diabetic chronic kidney disease: Secondary | ICD-10-CM | POA: Diagnosis not present

## 2023-05-14 DIAGNOSIS — Z807 Family history of other malignant neoplasms of lymphoid, hematopoietic and related tissues: Secondary | ICD-10-CM | POA: Diagnosis not present

## 2023-05-14 DIAGNOSIS — Z8049 Family history of malignant neoplasm of other genital organs: Secondary | ICD-10-CM | POA: Diagnosis not present

## 2023-05-14 DIAGNOSIS — D631 Anemia in chronic kidney disease: Secondary | ICD-10-CM | POA: Diagnosis not present

## 2023-05-14 DIAGNOSIS — E785 Hyperlipidemia, unspecified: Secondary | ICD-10-CM | POA: Diagnosis not present

## 2023-05-14 DIAGNOSIS — I10 Essential (primary) hypertension: Secondary | ICD-10-CM | POA: Diagnosis not present

## 2023-05-14 DIAGNOSIS — Z853 Personal history of malignant neoplasm of breast: Secondary | ICD-10-CM | POA: Diagnosis not present

## 2023-05-14 DIAGNOSIS — Z800 Family history of malignant neoplasm of digestive organs: Secondary | ICD-10-CM | POA: Insufficient documentation

## 2023-05-14 DIAGNOSIS — N183 Chronic kidney disease, stage 3 unspecified: Secondary | ICD-10-CM | POA: Diagnosis not present

## 2023-05-14 DIAGNOSIS — T8454XD Infection and inflammatory reaction due to internal left knee prosthesis, subsequent encounter: Secondary | ICD-10-CM

## 2023-05-14 DIAGNOSIS — N1832 Chronic kidney disease, stage 3b: Secondary | ICD-10-CM | POA: Insufficient documentation

## 2023-05-14 DIAGNOSIS — D509 Iron deficiency anemia, unspecified: Secondary | ICD-10-CM | POA: Insufficient documentation

## 2023-05-14 DIAGNOSIS — E538 Deficiency of other specified B group vitamins: Secondary | ICD-10-CM | POA: Diagnosis not present

## 2023-05-14 DIAGNOSIS — D500 Iron deficiency anemia secondary to blood loss (chronic): Secondary | ICD-10-CM

## 2023-05-14 LAB — CBC WITH DIFFERENTIAL/PLATELET
Abs Immature Granulocytes: 0.04 10*3/uL (ref 0.00–0.07)
Basophils Absolute: 0.1 10*3/uL (ref 0.0–0.1)
Basophils Relative: 1 %
Eosinophils Absolute: 0.2 10*3/uL (ref 0.0–0.5)
Eosinophils Relative: 3 %
HCT: 43.8 % (ref 36.0–46.0)
Hemoglobin: 13.3 g/dL (ref 12.0–15.0)
Immature Granulocytes: 1 %
Lymphocytes Relative: 14 %
Lymphs Abs: 0.9 10*3/uL (ref 0.7–4.0)
MCH: 27 pg (ref 26.0–34.0)
MCHC: 30.4 g/dL (ref 30.0–36.0)
MCV: 89 fL (ref 80.0–100.0)
Monocytes Absolute: 0.5 10*3/uL (ref 0.1–1.0)
Monocytes Relative: 8 %
Neutro Abs: 4.6 10*3/uL (ref 1.7–7.7)
Neutrophils Relative %: 73 %
Platelets: 211 10*3/uL (ref 150–400)
RBC: 4.92 MIL/uL (ref 3.87–5.11)
RDW: 14.1 % (ref 11.5–15.5)
WBC: 6.3 10*3/uL (ref 4.0–10.5)
nRBC: 0 % (ref 0.0–0.2)

## 2023-05-14 LAB — FERRITIN: Ferritin: 168 ng/mL (ref 11–307)

## 2023-05-14 LAB — IRON AND TIBC
Iron: 76 ug/dL (ref 28–170)
Saturation Ratios: 28 % (ref 10.4–31.8)
TIBC: 274 ug/dL (ref 250–450)
UIBC: 198 ug/dL

## 2023-05-14 LAB — C-REACTIVE PROTEIN: CRP: 0.6 mg/dL (ref <1.0)

## 2023-05-14 LAB — SEDIMENTATION RATE: Sed Rate: 1 mm/h (ref 0–22)

## 2023-05-14 NOTE — Patient Instructions (Signed)
Continue your medications.  Follow up in 3-6 months.  Take care  Dr. Lacinda Axon

## 2023-05-14 NOTE — Patient Instructions (Signed)
 [facility] **VISIT SUMMARY & IMPORTANT INSTRUCTIONS **   You were seen today by Rojelio Brenner PA-C for your follow-up visit.    ANEMIA: Your blood and iron levels look great! You do not need any IV iron at this time. Continue taking daily iron supplement. Continue monthly B12 injections.  FOLLOW-UP APPOINTMENT: Labs and office visit in 6 months  ** Thank you for trusting me with your healthcare!  I strive to provide all of my patients with quality care at each visit.  If you receive a survey for this visit, I would be so grateful to you for taking the time to provide feedback.  Thank you in advance!  ~ Anothy Bufano                   Dr. Doreatha Massed   &   Rojelio Brenner, PA-C   - - - - - - - - - - - - - - - - - -    Thank you for choosing  Cancer Center at [facility] to provide your oncology and hematology care.  To afford each patient quality time with our provider, please arrive at least 15 minutes before your scheduled appointment time.   If you have a lab appointment with the Cancer Center please come in thru the Main Entrance and check in at the main information desk.  You need to re-schedule your appointment should you arrive 10 or more minutes late.  We strive to give you quality time with our providers, and arriving late affects you and other patients whose appointments are after yours.  Also, if you no show three or more times for appointments you may be dismissed from the clinic at the providers discretion.     Again, thank you for choosing [facility].  Our hope is that these requests will decrease the amount of time that you wait before being seen by our physicians.       _____________________________________________________________  Should you have questions after your visit to [facility], please contact our office at [phone] and follow the prompts.  Our office hours are 8:00  a.m. and 4:30 p.m. Monday - Friday.  Please note that voicemails left after 4:00 p.m. may not be returned until the following business day.  We are closed weekends and major holidays.  You do have access to a nurse 24-7, just call the main number to the clinic [phone] and do not press any options, hold on the line and a nurse will answer the phone.    For prescription refill requests, have your pharmacy contact our office and allow 72 hours.

## 2023-05-15 MED ORDER — CARVEDILOL 3.125 MG PO TABS
3.1250 mg | ORAL_TABLET | Freq: Two times a day (BID) | ORAL | 1 refills | Status: DC
Start: 2023-05-15 — End: 2023-12-12

## 2023-05-15 MED ORDER — VALSARTAN 80 MG PO TABS: 80.0000 mg | ORAL_TABLET | Freq: Every day | ORAL | 3 refills | Status: AC

## 2023-05-15 MED ORDER — PRAVASTATIN SODIUM 10 MG PO TABS: 10.0000 mg | ORAL_TABLET | Freq: Every evening | ORAL | 3 refills | Status: AC

## 2023-05-15 NOTE — Assessment & Plan Note (Signed)
 Well-controlled.  Planning for labs including urine ACR as well as foot exam at next visit.

## 2023-05-15 NOTE — Assessment & Plan Note (Signed)
At goal on pravastatin.  Continue.

## 2023-05-15 NOTE — Assessment & Plan Note (Signed)
 Stable.  Continue current medications.

## 2023-05-15 NOTE — Assessment & Plan Note (Signed)
CKD stable °

## 2023-05-15 NOTE — Progress Notes (Signed)
 Subjective:  Patient ID: Shelly [name], female    DOB: 01-16-1941  Age: 83 y.o. MRN: [id number]  CC:  Follow up   HPI:  83 year old female presents for follow-up.  Patient states that overall she is doing fairly well.  BP stable on carvedilol and valsartan.  Follows with hematology/oncology regarding iron deficiency anemia and B12 deficiency.  Stable.  Type 2 diabetes has been stable.  Last A1c 5.5.  She is on no pharmacotherapy regarding this at this time.  Patient Active Problem List   Diagnosis Date Noted   Iron deficiency anemia 11/18/2022   Status post revision of total replacement of left knee 09/17/2022   Infection of prosthetic left knee joint (HCC) 09/16/2022   Chronic pain of right wrist 07/18/2022   Anxiety and depression 06/04/2022   Memory changes 06/04/2022   B12 deficiency 04/17/2022   CKD (chronic kidney disease) stage 3, GFR 30-59 ml/min (HCC) 04/16/2022   Legally blind 04/16/2022   Malignant neoplasm of upper-outer quadrant of left breast in female, estrogen receptor positive (HCC) 09/18/2017   Hyperlipidemia    Diabetes mellitus (HCC)    PUD (peptic ulcer disease)    Essential hypertension 12/07/2008    Social Hx   Social History   Socioeconomic History   Marital status: Divorced    Spouse name: Not on file   Number of children: 2   Years of education: Not on file   Highest education level: Some college, no degree  Occupational History   Not on file  Tobacco Use   Smoking status: Former    Current packs/day: 0.00    Average packs/day: 2.5 packs/day for 27.0 years (67.5 ttl pk-yrs)    Types: Cigarettes    Start date: 02/12/1954    Quit date: 02/12/1981    Years since quitting: 42.2   Smokeless tobacco: Never  Vaping Use   Vaping status: Never Used  Substance and Sexual Activity   Alcohol use: No   Drug use: No   Sexual activity: Not Currently  Other Topics Concern   Not on file  Social History Narrative   Lives locally. Works part  time at the [employer].    Social Drivers of Corporate investment banker Strain: Low Risk  (12/17/2022)   Overall Financial Resource Strain (CARDIA)    Difficulty of Paying Living Expenses: Not very hard  Food Insecurity: No Food Insecurity (12/17/2022)   Hunger Vital Sign    Worried About Running Out of Food in the Last Year: Never true    Ran Out of Food in the Last Year: Never true  Transportation Needs: No Transportation Needs (12/17/2022)   PRAPARE - Administrator, Civil Service (Medical): No    Lack of Transportation (Non-Medical): No  Physical Activity: Unknown (12/17/2022)   Exercise Vital Sign    Days of Exercise per Week: Patient declined    Minutes of Exercise per Session: 0 min  Recent Concern: Physical Activity - Inactive (11/09/2022)   Exercise Vital Sign    Days of Exercise per Week: 0 days    Minutes of Exercise per Session: 0 min  Stress: No Stress Concern Present (12/17/2022)   [employer] of Occupational Health - Occupational Stress Questionnaire    Feeling of Stress : Only a little  Social Connections: Moderately Isolated (12/17/2022)   Social Connection and Isolation Panel [NHANES]    Frequency of Communication with Friends and Family: More than three times a week  Frequency of Social Gatherings with Friends and Family: Three times a week    Attends Religious Services: 1 to 4 times per year    Active Member of Clubs or Organizations: No    Attends Banker Meetings: Never    Marital Status: Divorced    Review of Systems  Respiratory:  Positive for shortness of breath.   Cardiovascular: Negative.    Objective:  BP 138/67   Pulse 70   Temp 98.1 F (36.7 C)   Ht 5\' 3"  (1.6 m)   Wt 221 lb 6.4 oz (100.4 kg)   SpO2 93%   BMI 39.22 kg/m      05/14/2023    3:06 PM 05/14/2023    1:58 PM 02/20/2023   11:52 AM  BP/Weight  Systolic BP 138 137 116  Diastolic BP 67 64 52  Wt. (Lbs) 221.4 222   BMI 39.22  kg/m2 39.33 kg/m2     Physical Exam Vitals and nursing note reviewed.  Constitutional:      General: She is not in acute distress.    Appearance: She is obese.  HENT:     Head: Normocephalic and atraumatic.  Cardiovascular:     Rate and Rhythm: Normal rate and regular rhythm.  Pulmonary:     Effort: Pulmonary effort is normal.     Breath sounds: Normal breath sounds. No wheezing, rhonchi or rales.  Neurological:     Mental Status: She is alert.  Psychiatric:        Mood and Affect: Mood normal.        Behavior: Behavior normal.     Lab Results  Component Value Date   WBC 6.3 05/14/2023   HGB 13.3 05/14/2023   HCT 43.8 05/14/2023   PLT 211 05/14/2023   GLUCOSE 152 (H) 02/15/2023   CHOL 156 04/16/2022   TRIG 330 (H) 04/16/2022   HDL 47 04/16/2022   LDLCALC 58 04/16/2022   ALT 11 04/16/2022   AST 16 04/16/2022   NA 136 02/15/2023   K 5.1 02/15/2023   CL 104 02/15/2023   CREATININE 1.12 (H) 02/15/2023   BUN 48 (H) 02/15/2023   CO2 26 02/15/2023   INR 0.98 06/25/2012   HGBA1C 5.5 11/16/2022     Assessment & Plan:  Essential hypertension Assessment & Plan: Stable.  Continue current medications.  Orders: -     Carvedilol; Take 1 tablet (3.125 mg total) by mouth 2 (two) times daily. Dose reduction per patientTake 3.125 mg by mouth 2 (two) times daily. Dose reduction per patient  Dispense: 180 tablet; Refill: 1 -     Valsartan; Take 1 tablet (80 mg total) by mouth daily.  Dispense: 90 tablet; Refill: 3  Type 2 diabetes mellitus with stage 3 chronic kidney disease, without long-term current use of insulin, unspecified whether stage 3a or 3b CKD (HCC) Assessment & Plan: Well-controlled.  Planning for labs including urine ACR as well as foot exam at next visit.   Stage 3b chronic kidney disease (HCC) Assessment & Plan: CKD stable.   Hyperlipidemia, unspecified hyperlipidemia type Assessment & Plan: At goal on pravastatin.  Continue.  Orders: -      Pravastatin Sodium; Take 1 tablet (10 mg total) by mouth every evening. Take 10 mg by mouth every evening.  Dispense: 90 tablet; Refill: 3    Follow-up: 3 to 6 months  Thaily Hackworth Adriana Simas DO [facility]

## 2023-05-20 ENCOUNTER — Other Ambulatory Visit: Payer: Self-pay

## 2023-05-20 MED ORDER — B-12 COMPLIANCE INJECTION 1000 MCG/ML IJ KIT: 1.0000 mL | PACK | INTRAMUSCULAR | 4 refills | Status: DC

## 2023-05-22 ENCOUNTER — Other Ambulatory Visit: Payer: Self-pay

## 2023-05-22 ENCOUNTER — Telehealth: Payer: Self-pay

## 2023-05-22 ENCOUNTER — Ambulatory Visit: Admitting: Internal Medicine

## 2023-05-22 ENCOUNTER — Encounter: Payer: Self-pay | Admitting: Internal Medicine

## 2023-05-22 VITALS — BP 150/94 | HR 75 | Temp 97.3°F | Ht 63.0 in | Wt 214.0 lb

## 2023-05-22 DIAGNOSIS — T8454XD Infection and inflammatory reaction due to internal left knee prosthesis, subsequent encounter: Secondary | ICD-10-CM | POA: Diagnosis not present

## 2023-05-22 DIAGNOSIS — S39012A Strain of muscle, fascia and tendon of lower back, initial encounter: Secondary | ICD-10-CM

## 2023-05-22 DIAGNOSIS — T148XXA Other injury of unspecified body region, initial encounter: Secondary | ICD-10-CM

## 2023-05-22 MED ORDER — CEFADROXIL 500 MG PO CAPS: 500.0000 mg | ORAL_CAPSULE | Freq: Two times a day (BID) | ORAL | 0 refills | Status: DC

## 2023-05-22 MED ORDER — METHOCARBAMOL 500 MG PO TABS: 500.0000 mg | ORAL_TABLET | Freq: Three times a day (TID) | ORAL | 0 refills | Status: AC | PRN

## 2023-05-22 NOTE — Progress Notes (Signed)
 Patient ID: Shelly [name], female   DOB: 1940-12-07, 83 y.o.   MRN: [id number]  HPI Shelly [name] is an 83 yo F with left knee MSSA PJI with revision on 8/5 where she has finished IV cefazolin and now on cefadroxil for which she is tolerating. , but may need to do chronic suppression thereafter.  Patient is legally blind due to macular degeneration. She is at the 6-7 month mark since left knee I x D with antibiotic articulating spacer. No plans for further surgery. She does report that she   Pulled back muscle in the shower this morning. Doing well with abtx    Lab Results  Component Value Date   ESRSEDRATE 1 05/14/2023     Outpatient Encounter Medications as of 05/22/2023  Medication Sig   acetaminophen (TYLENOL) 650 MG CR tablet Take 650 mg by mouth every 8 (eight) hours as needed for pain.   albuterol (VENTOLIN HFA) 108 (90 Base) MCG/ACT inhaler Inhale 2 puffs into the lungs every 6 (six) hours as needed for shortness of breath.   carvedilol (COREG) 3.125 MG tablet Take 1 tablet (3.125 mg total) by mouth 2 (two) times daily. Dose reduction per patientTake 3.125 mg by mouth 2 (two) times daily. Dose reduction per patient   cefadroxil (DURICEF) 500 MG capsule Take 2 capsules (1,000 mg total) by mouth 2 (two) times daily.   cetirizine (ZYRTEC) 10 MG tablet Take 10 mg by mouth daily as needed for allergies.   cholecalciferol (VITAMIN D3) 25 MCG (1000 UNIT) tablet Take 1,000 Units by mouth daily.   Cyanocobalamin (B-12 COMPLIANCE INJECTION) 1000 MCG/ML KIT Inject 1 mL as directed every 30 (thirty) days.   cyclobenzaprine (FLEXERIL) 5 MG tablet Take 5 mg by mouth 3 (three) times daily as needed for muscle spasms.   fluconazole (DIFLUCAN) 150 MG tablet Take 1 tablet (150 mg total) by mouth once a week.   Iron, Ferrous Sulfate, 325 (65 Fe) MG TABS Take 325 mg by mouth every other day.   lidocaine (XYLOCAINE) 5 % ointment Apply 1 Application topically 3 (three) times daily as needed.    Multiple Vitamins-Minerals (PRESERVISION AREDS PO) Take 1 tablet by mouth 2 (two) times daily.   nystatin (MYCOSTATIN/NYSTOP) powder Apply 1 Application topically 2 (two) times daily.   pravastatin (PRAVACHOL) 10 MG tablet Take 1 tablet (10 mg total) by mouth every evening. Take 10 mg by mouth every evening.   sertraline (ZOLOFT) 100 MG tablet Take 1 tablet (100 mg total) by mouth daily.   Sod Picosulfate-Mag Ox-Cit Acd (CLENPIQ) 10-3.5-12 MG-GM -GM/175ML SOLN Take 1 kit by mouth as directed.   valsartan (DIOVAN) 80 MG tablet Take 1 tablet (80 mg total) by mouth daily.   No facility-administered encounter medications on file as of 05/22/2023.     Patient Active Problem List   Diagnosis Date Noted   Iron deficiency anemia 11/18/2022   Status post revision of total replacement of left knee 09/17/2022   Infection of prosthetic left knee joint (HCC) 09/16/2022   Chronic pain of right wrist 07/18/2022   Anxiety and depression 06/04/2022   Memory changes 06/04/2022   B12 deficiency 04/17/2022   CKD (chronic kidney disease) stage 3, GFR 30-59 ml/min (HCC) 04/16/2022   Legally blind 04/16/2022   Malignant neoplasm of upper-outer quadrant of left breast in female, estrogen receptor positive (HCC) 09/18/2017   Hyperlipidemia    Diabetes mellitus (HCC)    PUD (peptic ulcer disease)    Essential hypertension 12/07/2008  Health Maintenance Due  Topic Date Due   FOOT EXAM  Never done   DTaP/Tdap/Td (1 - Tdap) Never done   Zoster Vaccines- Shingrix (1 of 2) Never done   Diabetic kidney evaluation - Urine ACR  04/16/2023   HEMOGLOBIN A1C  05/17/2023     Review of Systems 12 point ros except what is mentioned in hpi Physical Exam   BP (!) 150/94   Pulse 75   Temp (!) 97.3 F (36.3 C) (Temporal)   Ht 5\' 3"  (1.6 m)   Wt 214 lb (97.1 kg)   BMI 37.91 kg/m    Physical Exam  Constitutional:  oriented to person, place, and time. appears well-developed and well-nourished. No distress.   HENT: [place]/AT, PERRLA, no scleral icterus Mouth/Throat: Oropharynx is clear and moist. No oropharyngeal exudate.  Cardiovascular: Normal rate, regular rhythm and normal heart sounds. Exam reveals no gallop and no friction rub.  No murmur heard.  Pulmonary/Chest: Effort normal and breath sounds normal. No respiratory distress.  has no wheezes.  Neck = supple, no nuchal rigidity Abdominal: Soft. Bowel sounds are normal.  exhibits no distension. There is no tenderness.  Lymphadenopathy: no cervical adenopathy. No axillary adenopathy Neurological: alert and oriented to person, place, and time.  Skin: Skin is warm and dry. No rash noted. No erythema.  Psychiatric: a normal mood and affect.  behavior is normal.    CBC Lab Results  Component Value Date   WBC 6.3 05/14/2023   RBC 4.92 05/14/2023   HGB 13.3 05/14/2023   HCT 43.8 05/14/2023   PLT 211 05/14/2023   MCV 89.0 05/14/2023   MCH 27.0 05/14/2023   MCHC 30.4 05/14/2023   RDW 14.1 05/14/2023   LYMPHSABS 0.9 05/14/2023   MONOABS 0.5 05/14/2023   EOSABS 0.2 05/14/2023    BMET Lab Results  Component Value Date   NA 136 02/15/2023   K 5.1 02/15/2023   CL 104 02/15/2023   CO2 26 02/15/2023   GLUCOSE 152 (H) 02/15/2023   BUN 48 (H) 02/15/2023   CREATININE 1.12 (H) 02/15/2023   CALCIUM 8.8 (L) 02/15/2023   GFRNONAA 49 (L) 02/15/2023   GFRAA >60 09/25/2017      Assessment and Plan Left knee pji = Continue on cefadroxil; now at 6 month.. drop down cefadroxil 500mg  po bid; see back in  2 months; sed rate is at [facility] still  Muscle strain = will do a short trial of robaxin  I have personally spent 32 minutes involved in face-to-face and non-face-to-face activities for this patient on the day of the visit. Professional time spent includes the following activities: Preparing to see the patient (review of tests), Obtaining and/or reviewing separately obtained history (admission/discharge record), Performing a medically appropriate  examination and/or evaluation , Ordering medications/tests/procedures, referring and communicating with other health care professionals, Documenting clinical information in the EMR, Independently interpreting results (not separately reported), Communicating results to the patient/family/caregiver, Counseling and educating the patient/family/caregiver and Care coordination (not separately reported).

## 2023-05-22 NOTE — Telephone Encounter (Signed)
 Received call from [facility] wanting to confirm #[phone] (60 day supply) for cefadroxil. Relayed that provider's note from today states to continue cefadroxil and follow up in 2 months.   Linna Hoff, BSN, RN

## 2023-07-15 ENCOUNTER — Encounter: Payer: Self-pay | Admitting: Hematology

## 2023-07-17 ENCOUNTER — Encounter (INDEPENDENT_AMBULATORY_CARE_PROVIDER_SITE_OTHER): Payer: PPO | Admitting: Ophthalmology

## 2023-07-17 DIAGNOSIS — H35033 Hypertensive retinopathy, bilateral: Secondary | ICD-10-CM

## 2023-07-17 DIAGNOSIS — I10 Essential (primary) hypertension: Secondary | ICD-10-CM | POA: Diagnosis not present

## 2023-07-17 DIAGNOSIS — H353231 Exudative age-related macular degeneration, bilateral, with active choroidal neovascularization: Secondary | ICD-10-CM

## 2023-07-17 DIAGNOSIS — H43813 Vitreous degeneration, bilateral: Secondary | ICD-10-CM

## 2023-07-24 ENCOUNTER — Ambulatory Visit: Payer: PPO | Admitting: Cardiology

## 2023-08-01 ENCOUNTER — Ambulatory Visit: Admitting: Internal Medicine

## 2023-08-05 ENCOUNTER — Ambulatory Visit: Admitting: Family Medicine

## 2023-08-06 ENCOUNTER — Ambulatory Visit: Admitting: Family Medicine

## 2023-08-06 ENCOUNTER — Encounter: Payer: Self-pay | Admitting: Family Medicine

## 2023-08-06 VITALS — BP 142/68 | HR 83 | Temp 97.6°F | Ht 63.0 in | Wt 222.0 lb

## 2023-08-06 DIAGNOSIS — L97909 Non-pressure chronic ulcer of unspecified part of unspecified lower leg with unspecified severity: Secondary | ICD-10-CM | POA: Diagnosis not present

## 2023-08-06 DIAGNOSIS — I83009 Varicose veins of unspecified lower extremity with ulcer of unspecified site: Secondary | ICD-10-CM | POA: Diagnosis not present

## 2023-08-06 NOTE — Patient Instructions (Signed)
 RN will come to your home.  Call with concerns.  Take care  Dr. Bluford

## 2023-08-07 DIAGNOSIS — L97909 Non-pressure chronic ulcer of unspecified part of unspecified lower leg with unspecified severity: Secondary | ICD-10-CM | POA: Insufficient documentation

## 2023-08-07 DIAGNOSIS — I83009 Varicose veins of unspecified lower extremity with ulcer of unspecified site: Secondary | ICD-10-CM | POA: Insufficient documentation

## 2023-08-07 NOTE — Assessment & Plan Note (Addendum)
 Arranging home health RN for wound care.   Patient will be a good candidate for an Radio broadcast assistant.  Referring to wound care center. Advised elevation.

## 2023-08-07 NOTE — Progress Notes (Signed)
 Subjective:  Patient ID: Shelly [name], female    DOB: Feb 09, 1941  Age: 83 y.o. MRN: [id number]  CC:   Chief Complaint  Patient presents with   Leg Swelling    Swelling, weeping and burning x's 1 month     HPI:  83 year old female presents for ration of the above.  Over the past month patient has had swelling in her right lower extremity.  She developed small blisters which have now ruptured leaving a large open wound to her right lower extremity.  Serous drainage.  No purulent drainage.  Wound has not healed.  No fever.  Patient on chronic antibiotic therapy due to infection associated with left knee prosthesis.  Patient Active Problem List   Diagnosis Date Noted   Venous ulcer (HCC) 08/07/2023   Iron  deficiency anemia 11/18/2022   Status post revision of total replacement of left knee 09/17/2022   Infection of prosthetic left knee joint (HCC) 09/16/2022   Chronic pain of right wrist 07/18/2022   Anxiety and depression 06/04/2022   Memory changes 06/04/2022   B12 deficiency 04/17/2022   CKD (chronic kidney disease) stage 3, GFR 30-59 ml/min (HCC) 04/16/2022   Legally blind 04/16/2022   Malignant neoplasm of upper-outer quadrant of left breast in female, estrogen receptor positive (HCC) 09/18/2017   Hyperlipidemia    Diabetes mellitus (HCC)    PUD (peptic ulcer disease)    Essential hypertension 12/07/2008    Social Hx   Social History   Socioeconomic History   Marital status: Divorced    Spouse name: Not on file   Number of children: 2   Years of education: Not on file   Highest education level: 12th grade  Occupational History   Not on file  Tobacco Use   Smoking status: Former    Current packs/day: 0.00    Average packs/day: 2.5 packs/day for 27.0 years (67.5 ttl pk-yrs)    Types: Cigarettes    Start date: 02/12/1954    Quit date: 02/12/1981    Years since quitting: 42.5   Smokeless tobacco: Never  Vaping Use   Vaping status: Never Used  Substance and  Sexual Activity   Alcohol use: No   Drug use: No   Sexual activity: Not Currently  Other Topics Concern   Not on file  Social History Narrative   Lives locally. Works part time at the [employer].    Social Drivers of Corporate investment banker Strain: Low Risk  (08/05/2023)   Overall Financial Resource Strain (CARDIA)    Difficulty of Paying Living Expenses: Not very hard  Food Insecurity: No Food Insecurity (08/05/2023)   Hunger Vital Sign    Worried About Running Out of Food in the Last Year: Never true    Ran Out of Food in the Last Year: Never true  Transportation Needs: No Transportation Needs (12/17/2022)   PRAPARE - Administrator, Civil Service (Medical): No    Lack of Transportation (Non-Medical): No  Physical Activity: Inactive (08/05/2023)   Exercise Vital Sign    Days of Exercise per Week: 0 days    Minutes of Exercise per Session: Not on file  Stress: No Stress Concern Present (12/17/2022)   [employer] of Occupational Health - Occupational Stress Questionnaire    Feeling of Stress : Only a little  Social Connections: Unknown (08/05/2023)   Social Connection and Isolation Panel    Frequency of Communication with Friends and Family: More than three  times a week    Frequency of Social Gatherings with Friends and Family: Three times a week    Attends Religious Services: Not on file    Active Member of Clubs or Organizations: No    Attends Banker Meetings: Not on file    Marital Status: Not on file    Review of Systems Per HPI  Objective:  BP (!) 142/68   Pulse 83   Temp 97.6 F (36.4 C)   Ht 5' 3 (1.6 m)   Wt 222 lb (100.7 kg)   SpO2 95%   BMI 39.33 kg/m      08/06/2023    4:08 PM 05/22/2023   11:03 AM 05/14/2023    3:06 PM  BP/Weight  Systolic BP 142 150 138  Diastolic BP 68 94 67  Wt. (Lbs) 222 214 221.4  BMI 39.33 kg/m2 37.91 kg/m2 39.22 kg/m2    Physical Exam Constitutional:       Appearance: Normal appearance. She is obese.  HENT:     Head: Normocephalic and atraumatic.  Pulmonary:     Effort: Pulmonary effort is normal. No respiratory distress.   Musculoskeletal:       Legs:     Comments: Open wound at the labeled location.  No purulent drainage.  Tender to palpation.   Neurological:     Mental Status: She is alert.     Lab Results  Component Value Date   WBC 6.3 05/14/2023   HGB 13.3 05/14/2023   HCT 43.8 05/14/2023   PLT 211 05/14/2023   GLUCOSE 152 (H) 02/15/2023   CHOL 156 04/16/2022   TRIG 330 (H) 04/16/2022   HDL 47 04/16/2022   LDLCALC 58 04/16/2022   ALT 11 04/16/2022   AST 16 04/16/2022   NA 136 02/15/2023   K 5.1 02/15/2023   CL 104 02/15/2023   CREATININE 1.12 (H) 02/15/2023   BUN 48 (H) 02/15/2023   CO2 26 02/15/2023   INR 0.98 06/25/2012   HGBA1C 5.5 11/16/2022     Assessment & Plan:  Venous ulcer (HCC) Assessment & Plan: Arranging home health RN for wound care.   Patient will be a good candidate for an Radio broadcast assistant.  Referring to wound care center. Advised elevation.  Orders: -     Ambulatory referral to Home Health    Follow-up:  As previously scheduled.   Jacqulyn Ahle DO [facility]

## 2023-08-10 DIAGNOSIS — F32A Depression, unspecified: Secondary | ICD-10-CM | POA: Diagnosis not present

## 2023-08-10 DIAGNOSIS — N183 Chronic kidney disease, stage 3 unspecified: Secondary | ICD-10-CM | POA: Diagnosis not present

## 2023-08-10 DIAGNOSIS — L97811 Non-pressure chronic ulcer of other part of right lower leg limited to breakdown of skin: Secondary | ICD-10-CM | POA: Diagnosis not present

## 2023-08-10 DIAGNOSIS — Z87891 Personal history of nicotine dependence: Secondary | ICD-10-CM | POA: Diagnosis not present

## 2023-08-10 DIAGNOSIS — Z6841 Body Mass Index (BMI) 40.0 and over, adult: Secondary | ICD-10-CM | POA: Diagnosis not present

## 2023-08-10 DIAGNOSIS — Z556 Problems related to health literacy: Secondary | ICD-10-CM | POA: Diagnosis not present

## 2023-08-10 DIAGNOSIS — E538 Deficiency of other specified B group vitamins: Secondary | ICD-10-CM | POA: Diagnosis not present

## 2023-08-10 DIAGNOSIS — D509 Iron deficiency anemia, unspecified: Secondary | ICD-10-CM | POA: Diagnosis not present

## 2023-08-10 DIAGNOSIS — Z604 Social exclusion and rejection: Secondary | ICD-10-CM | POA: Diagnosis not present

## 2023-08-10 DIAGNOSIS — E785 Hyperlipidemia, unspecified: Secondary | ICD-10-CM | POA: Diagnosis not present

## 2023-08-10 DIAGNOSIS — E669 Obesity, unspecified: Secondary | ICD-10-CM | POA: Diagnosis not present

## 2023-08-10 DIAGNOSIS — I872 Venous insufficiency (chronic) (peripheral): Secondary | ICD-10-CM | POA: Diagnosis not present

## 2023-08-10 DIAGNOSIS — F419 Anxiety disorder, unspecified: Secondary | ICD-10-CM | POA: Diagnosis not present

## 2023-08-10 DIAGNOSIS — E1122 Type 2 diabetes mellitus with diabetic chronic kidney disease: Secondary | ICD-10-CM | POA: Diagnosis not present

## 2023-08-10 DIAGNOSIS — H548 Legal blindness, as defined in USA: Secondary | ICD-10-CM | POA: Diagnosis not present

## 2023-08-10 DIAGNOSIS — Z853 Personal history of malignant neoplasm of breast: Secondary | ICD-10-CM | POA: Diagnosis not present

## 2023-08-10 DIAGNOSIS — I129 Hypertensive chronic kidney disease with stage 1 through stage 4 chronic kidney disease, or unspecified chronic kidney disease: Secondary | ICD-10-CM | POA: Diagnosis not present

## 2023-08-14 ENCOUNTER — Other Ambulatory Visit: Payer: Self-pay

## 2023-08-14 MED ORDER — CEFADROXIL 500 MG PO CAPS
500.0000 mg | ORAL_CAPSULE | Freq: Two times a day (BID) | ORAL | 0 refills | Status: DC
Start: 2023-08-14 — End: 2023-10-10

## 2023-08-14 MED ORDER — CEFADROXIL 500 MG PO CAPS: 500.0000 mg | ORAL_CAPSULE | Freq: Two times a day (BID) | ORAL | 0 refills | Status: DC

## 2023-08-28 ENCOUNTER — Encounter: Attending: Physician Assistant | Admitting: Physician Assistant

## 2023-08-28 DIAGNOSIS — T8454XA Infection and inflammatory reaction due to internal left knee prosthesis, initial encounter: Secondary | ICD-10-CM | POA: Insufficient documentation

## 2023-08-28 DIAGNOSIS — L97812 Non-pressure chronic ulcer of other part of right lower leg with fat layer exposed: Secondary | ICD-10-CM | POA: Insufficient documentation

## 2023-08-28 DIAGNOSIS — E11622 Type 2 diabetes mellitus with other skin ulcer: Secondary | ICD-10-CM | POA: Insufficient documentation

## 2023-08-28 DIAGNOSIS — E1122 Type 2 diabetes mellitus with diabetic chronic kidney disease: Secondary | ICD-10-CM | POA: Insufficient documentation

## 2023-08-28 DIAGNOSIS — I87331 Chronic venous hypertension (idiopathic) with ulcer and inflammation of right lower extremity: Secondary | ICD-10-CM | POA: Insufficient documentation

## 2023-08-28 DIAGNOSIS — Z792 Long term (current) use of antibiotics: Secondary | ICD-10-CM | POA: Diagnosis not present

## 2023-08-28 DIAGNOSIS — Z853 Personal history of malignant neoplasm of breast: Secondary | ICD-10-CM | POA: Insufficient documentation

## 2023-08-28 DIAGNOSIS — N183 Chronic kidney disease, stage 3 unspecified: Secondary | ICD-10-CM | POA: Diagnosis not present

## 2023-08-28 DIAGNOSIS — I129 Hypertensive chronic kidney disease with stage 1 through stage 4 chronic kidney disease, or unspecified chronic kidney disease: Secondary | ICD-10-CM | POA: Diagnosis not present

## 2023-08-28 DIAGNOSIS — L03115 Cellulitis of right lower limb: Secondary | ICD-10-CM | POA: Diagnosis not present

## 2023-09-04 ENCOUNTER — Encounter: Admitting: Physician Assistant

## 2023-09-04 DIAGNOSIS — E11622 Type 2 diabetes mellitus with other skin ulcer: Secondary | ICD-10-CM | POA: Diagnosis not present

## 2023-09-04 DIAGNOSIS — L03115 Cellulitis of right lower limb: Secondary | ICD-10-CM | POA: Diagnosis not present

## 2023-09-04 DIAGNOSIS — L97812 Non-pressure chronic ulcer of other part of right lower leg with fat layer exposed: Secondary | ICD-10-CM | POA: Diagnosis not present

## 2023-09-11 ENCOUNTER — Encounter: Admitting: Physician Assistant

## 2023-09-11 DIAGNOSIS — L03115 Cellulitis of right lower limb: Secondary | ICD-10-CM | POA: Diagnosis not present

## 2023-09-11 DIAGNOSIS — L97812 Non-pressure chronic ulcer of other part of right lower leg with fat layer exposed: Secondary | ICD-10-CM | POA: Diagnosis not present

## 2023-09-11 DIAGNOSIS — E11622 Type 2 diabetes mellitus with other skin ulcer: Secondary | ICD-10-CM | POA: Diagnosis not present

## 2023-09-13 DIAGNOSIS — I890 Lymphedema, not elsewhere classified: Secondary | ICD-10-CM | POA: Diagnosis not present

## 2023-09-16 DIAGNOSIS — I890 Lymphedema, not elsewhere classified: Secondary | ICD-10-CM | POA: Diagnosis not present

## 2023-09-18 ENCOUNTER — Encounter: Attending: Physician Assistant | Admitting: Physician Assistant

## 2023-09-18 DIAGNOSIS — I129 Hypertensive chronic kidney disease with stage 1 through stage 4 chronic kidney disease, or unspecified chronic kidney disease: Secondary | ICD-10-CM | POA: Diagnosis not present

## 2023-09-18 DIAGNOSIS — Z96652 Presence of left artificial knee joint: Secondary | ICD-10-CM | POA: Diagnosis not present

## 2023-09-18 DIAGNOSIS — Z872 Personal history of diseases of the skin and subcutaneous tissue: Secondary | ICD-10-CM | POA: Diagnosis not present

## 2023-09-18 DIAGNOSIS — N183 Chronic kidney disease, stage 3 unspecified: Secondary | ICD-10-CM | POA: Diagnosis not present

## 2023-09-18 DIAGNOSIS — Z853 Personal history of malignant neoplasm of breast: Secondary | ICD-10-CM | POA: Diagnosis not present

## 2023-09-18 DIAGNOSIS — H3530 Unspecified macular degeneration: Secondary | ICD-10-CM | POA: Diagnosis not present

## 2023-09-18 DIAGNOSIS — Z09 Encounter for follow-up examination after completed treatment for conditions other than malignant neoplasm: Secondary | ICD-10-CM | POA: Diagnosis not present

## 2023-09-18 DIAGNOSIS — L97812 Non-pressure chronic ulcer of other part of right lower leg with fat layer exposed: Secondary | ICD-10-CM | POA: Diagnosis not present

## 2023-09-18 DIAGNOSIS — L03115 Cellulitis of right lower limb: Secondary | ICD-10-CM | POA: Diagnosis not present

## 2023-09-18 DIAGNOSIS — I890 Lymphedema, not elsewhere classified: Secondary | ICD-10-CM | POA: Insufficient documentation

## 2023-09-24 ENCOUNTER — Other Ambulatory Visit (INDEPENDENT_AMBULATORY_CARE_PROVIDER_SITE_OTHER): Payer: Self-pay

## 2023-09-24 ENCOUNTER — Ambulatory Visit: Payer: HMO | Admitting: Orthopaedic Surgery

## 2023-09-24 DIAGNOSIS — Z96652 Presence of left artificial knee joint: Secondary | ICD-10-CM

## 2023-09-24 DIAGNOSIS — T8454XA Infection and inflammatory reaction due to internal left knee prosthesis, initial encounter: Secondary | ICD-10-CM

## 2023-09-24 NOTE — Progress Notes (Signed)
 Post-Op Visit Note   Patient: Shelly [name]           Date of Birth: January 04, 1941           MRN: [id number] Visit Date: 09/24/2023 PCP: Cook, Jayce G, DO   Assessment & Plan:  Chief Complaint:  Chief Complaint  Patient presents with   Left Knee - Routine Post Op   Visit Diagnoses:  1. Status post revision of total replacement of left knee   2. 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 She was referred by Dr. Juli for follow-up on her knee implant and chronic antibiotic use.  She is a year post-surgery for her knee implant and is on chronic antibiotics. She experiences a 'knocking' sensation in the knee, attributed to the metal hitting the plastic, but it does not cause pain. No other issues with the knee are present. A sore on her leg developed after wearing an ankle brace, which is almost healed.  Physical Exam MUSCULOSKELETAL: Left knee shows fully healed surgical scar.  Unchanged from prior exam.  Assessment and Plan Status post right knee arthroplasty with chronic antibiotic suppression One year post-surgery, knee implant functioning well, scar healed, leg functional. - Continue chronic antibiotic suppression as per Dr. Valene recommendation. - No further scheduled follow-up with orthopaedics unless new issues arise.  Follow-Up Instructions: Return if symptoms worsen or fail to improve.   Orders:  Orders Placed This Encounter  Procedures   XR Knee 1-2 Views Left   No orders of the defined types were placed in this encounter.   Imaging: No results found.  PMFS History: Patient Active Problem List   Diagnosis Date Noted   Venous ulcer (HCC) 08/07/2023   Iron  deficiency anemia 11/18/2022   Status post revision of total replacement of left knee 09/17/2022    Infection of prosthetic left knee joint (HCC) 09/16/2022   Chronic pain of right wrist 07/18/2022   Anxiety and depression 06/04/2022   Memory changes 06/04/2022   B12 deficiency 04/17/2022   CKD (chronic kidney disease) stage 3, GFR 30-59 ml/min (HCC) 04/16/2022   Legally blind 04/16/2022   Malignant neoplasm of upper-outer quadrant of left breast in female, estrogen receptor positive (HCC) 09/18/2017   Hyperlipidemia    Diabetes mellitus (HCC)    PUD (peptic ulcer disease)    Essential hypertension 12/07/2008   Past Medical History:  Diagnosis Date   Arthritis    qwhere (07/01/2012)   Asthma    normal chest x-ray in 2010   Basal cell carcinoma of face    Breast cancer (HCC)    Cholelithiasis    Chronic lower back pain    Degenerative joint disease    Right TKA-2010; low back pain; hands and hips also affected   Exertional shortness of breath    mostly from being too fat (07/01/2012)   GERD (gastroesophageal reflux disease)    history   H/O hiatal hernia    Heart murmur    small   Hemorrhoid    HTN (hypertension)    Hyperlipidemia    Macular degeneration    both eyes (07/01/2012)   PUD (peptic ulcer disease) 2003   Upper GI bleed-gastric ulcer; gastroesophageal reflux disease   Seasonal allergies    Type II diabetes mellitus (HCC)  Family History  Problem Relation Age of Onset   Hodgkin's lymphoma Mother    Cervical cancer Sister        half sister mom's side   Lung cancer Sister    Colon cancer Sister    Breast cancer Neg Hx     Past Surgical History:  Procedure Laterality Date   APPENDECTOMY     BIOPSY  02/20/2023   Procedure: BIOPSY;  Surgeon: Shaaron Lamar HERO, MD;  Location: [facility];  Service: Endoscopy;;   BREAST LUMPECTOMY Left    BREAST LUMPECTOMY WITH RADIOACTIVE SEED LOCALIZATION Left 10/21/2017   Procedure: BREAST LUMPECTOMY WITH RADIOACTIVE SEED LOCALIZATION;  Surgeon: Gail Favorite, MD;  Location: [facility];  Service:  General;  Laterality: Left;   CATARACT EXTRACTION BILATERAL W/ ANTERIOR VITRECTOMY Bilateral    COLONOSCOPY WITH PROPOFOL  N/A 02/20/2023   Procedure: COLONOSCOPY WITH PROPOFOL ;  Surgeon: Shaaron Lamar HERO, MD;  Location: [facility];  Service: Endoscopy;  Laterality: N/A;  10:00am, asa 3   ESOPHAGOGASTRODUODENOSCOPY (EGD) WITH PROPOFOL  N/A 02/20/2023   Procedure: ESOPHAGOGASTRODUODENOSCOPY (EGD) WITH PROPOFOL ;  Surgeon: Shaaron Lamar HERO, MD;  Location: [facility];  Service: Endoscopy;  Laterality: N/A;   EYE SURGERY Bilateral    laser in eyes to stop bleeding and injections for macular degeneration (07/01/2012)   FOOT SURGERY Bilateral    straighten 1st toe with a wedge.   KNEE ARTHROSCOPY Bilateral    POLYPECTOMY  02/20/2023   Procedure: POLYPECTOMY;  Surgeon: Shaaron Lamar HERO, MD;  Location: [facility];  Service: Endoscopy;;   SKIN CANCER EXCISION  2013   face (07/01/2012)   TOTAL KNEE ARTHROPLASTY Right 2011   TOTAL KNEE ARTHROPLASTY Left 07/01/2012   TOTAL KNEE ARTHROPLASTY Left 07/01/2012   Procedure: LEFT TOTAL KNEE ARTHROPLASTY;  Surgeon: Maude LELON Right, MD;  Location: [facility];  Service: Orthopedics;  Laterality: Left;  Left Total Knee Arthroplasty   TOTAL KNEE REVISION Left 09/17/2022   Procedure: LEFT KNEE RESECTION ARTHROPLASTY, REVISION TOTAL KNEE ARTHROPLASTY;  Surgeon: Jerri [name] HERO, MD;  Location: [facility];  Service: Orthopedics;  Laterality: Left;   Social History   Occupational History   Not on file  Tobacco Use   Smoking status: Former    Current packs/day: 0.00    Average packs/day: 2.5 packs/day for 27.0 years (67.5 ttl pk-yrs)    Types: Cigarettes    Start date: 02/12/1954    Quit date: 02/12/1981    Years since quitting: 42.6   Smokeless tobacco: Never  Vaping Use   Vaping status: Never Used  Substance and Sexual Activity   Alcohol use: No   Drug use: No   Sexual activity: Not Currently

## 2023-09-24 NOTE — Progress Notes (Signed)
 Post-Op Visit Note   Patient: Shelly [name]           Date of Birth: 09-02-1940           MRN: [id number] Visit Date: 09/24/2023 PCP: Cook, Jayce G, DO   Assessment & Plan:  Chief Complaint:  Chief Complaint  Patient presents with   Left Knee - Routine Post Op   Visit Diagnoses:  1. Status post revision of total replacement of left knee   2. 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 She was referred by Dr. Juli for follow-up on her knee implant and chronic antibiotic use.  She is a year post-surgery for her knee implant and is on chronic antibiotics. She experiences a 'knocking' sensation in the knee, attributed to the metal hitting the plastic, but it does not cause pain. No other issues with the knee are present. A sore on her leg developed after wearing an ankle brace, which is almost healed.  Physical Exam MUSCULOSKELETAL: Left knee shows fully healed surgical scar.  Unchanged from prior exam.  Assessment and Plan Status post right knee arthroplasty with chronic antibiotic suppression One year post-surgery, knee implant functioning well, scar healed, leg functional. - Continue chronic antibiotic suppression as per Dr. Valene recommendation. - No further scheduled follow-up with orthopaedics unless new issues arise.  Follow-Up Instructions: Return if symptoms worsen or fail to improve.   Orders:  Orders Placed This Encounter  Procedures   XR Knee 1-2 Views Left   No orders of the defined types were placed in this encounter.   Imaging: No results found.  PMFS History: Patient Active Problem List   Diagnosis Date Noted   Venous ulcer (HCC) 08/07/2023   Iron  deficiency anemia 11/18/2022   Status post revision of total replacement of left knee 09/17/2022    Infection of prosthetic left knee joint (HCC) 09/16/2022   Chronic pain of right wrist 07/18/2022   Anxiety and depression 06/04/2022   Memory changes 06/04/2022   B12 deficiency 04/17/2022   CKD (chronic kidney disease) stage 3, GFR 30-59 ml/min (HCC) 04/16/2022   Legally blind 04/16/2022   Malignant neoplasm of upper-outer quadrant of left breast in female, estrogen receptor positive (HCC) 09/18/2017   Hyperlipidemia    Diabetes mellitus (HCC)    PUD (peptic ulcer disease)    Essential hypertension 12/07/2008   Past Medical History:  Diagnosis Date   Arthritis    qwhere (07/01/2012)   Asthma    normal chest x-ray in 2010   Basal cell carcinoma of face    Breast cancer (HCC)    Cholelithiasis    Chronic lower back pain    Degenerative joint disease    Right TKA-2010; low back pain; hands and hips also affected   Exertional shortness of breath    mostly from being too fat (07/01/2012)   GERD (gastroesophageal reflux disease)    history   H/O hiatal hernia    Heart murmur    small   Hemorrhoid    HTN (hypertension)    Hyperlipidemia    Macular degeneration    both eyes (07/01/2012)   PUD (peptic ulcer disease) 2003   Upper GI bleed-gastric ulcer; gastroesophageal reflux disease   Seasonal allergies    Type II diabetes mellitus (HCC)  Family History  Problem Relation Age of Onset   Hodgkin's lymphoma Mother    Cervical cancer Sister        half sister mom's side   Lung cancer Sister    Colon cancer Sister    Breast cancer Neg Hx     Past Surgical History:  Procedure Laterality Date   APPENDECTOMY     BIOPSY  02/20/2023   Procedure: BIOPSY;  Surgeon: Shaaron Lamar HERO, MD;  Location: [facility];  Service: Endoscopy;;   BREAST LUMPECTOMY Left    BREAST LUMPECTOMY WITH RADIOACTIVE SEED LOCALIZATION Left 10/21/2017   Procedure: BREAST LUMPECTOMY WITH RADIOACTIVE SEED LOCALIZATION;  Surgeon: Gail Favorite, MD;  Location: [facility];  Service:  General;  Laterality: Left;   CATARACT EXTRACTION BILATERAL W/ ANTERIOR VITRECTOMY Bilateral    COLONOSCOPY WITH PROPOFOL  N/A 02/20/2023   Procedure: COLONOSCOPY WITH PROPOFOL ;  Surgeon: Shaaron Lamar HERO, MD;  Location: [facility];  Service: Endoscopy;  Laterality: N/A;  10:00am, asa 3   ESOPHAGOGASTRODUODENOSCOPY (EGD) WITH PROPOFOL  N/A 02/20/2023   Procedure: ESOPHAGOGASTRODUODENOSCOPY (EGD) WITH PROPOFOL ;  Surgeon: Shaaron Lamar HERO, MD;  [facility]: [facility];  Service: Endoscopy;  Laterality: N/A;   EYE SURGERY Bilateral    laser in eyes to stop bleeding and injections for macular degeneration (07/01/2012)   FOOT SURGERY Bilateral    straighten 1st toe with a wedge.   KNEE ARTHROSCOPY Bilateral    POLYPECTOMY  02/20/2023   Procedure: POLYPECTOMY;  Surgeon: Shaaron Lamar HERO, MD;  [facility]: [facility];  Service: Endoscopy;;   SKIN CANCER EXCISION  2013   face (07/01/2012)   TOTAL KNEE ARTHROPLASTY Right 2011   TOTAL KNEE ARTHROPLASTY Left 07/01/2012   TOTAL KNEE ARTHROPLASTY Left 07/01/2012   Procedure: LEFT TOTAL KNEE ARTHROPLASTY;  Surgeon: Maude LELON Right, MD;  Location: [facility] OR;  Service: Orthopedics;  Laterality: Left;  Left Total Knee Arthroplasty   TOTAL KNEE REVISION Left 09/17/2022   Procedure: LEFT KNEE RESECTION ARTHROPLASTY, REVISION TOTAL KNEE ARTHROPLASTY;  Surgeon: Jerri [name] HERO, MD;  Location: [facility];  Service: Orthopedics;  Laterality: Left;   Social History   Occupational History   Not on file  Tobacco Use   Smoking status: Former    Current packs/day: 0.00    Average packs/day: 2.5 packs/day for 27.0 years (67.5 ttl pk-yrs)    Types: Cigarettes    Start date: 02/12/1954    Quit date: 02/12/1981    Years since quitting: 42.6   Smokeless tobacco: Never  Vaping Use   Vaping status: Never Used  Substance and Sexual Activity   Alcohol use: No   Drug use: No   Sexual activity: Not Currently

## 2023-09-25 ENCOUNTER — Encounter: Admitting: Physician Assistant

## 2023-09-25 DIAGNOSIS — Z09 Encounter for follow-up examination after completed treatment for conditions other than malignant neoplasm: Secondary | ICD-10-CM | POA: Diagnosis not present

## 2023-09-25 DIAGNOSIS — I890 Lymphedema, not elsewhere classified: Secondary | ICD-10-CM | POA: Diagnosis not present

## 2023-09-25 DIAGNOSIS — L97812 Non-pressure chronic ulcer of other part of right lower leg with fat layer exposed: Secondary | ICD-10-CM | POA: Diagnosis not present

## 2023-10-10 ENCOUNTER — Encounter: Payer: Self-pay | Admitting: Internal Medicine

## 2023-10-10 ENCOUNTER — Ambulatory Visit: Admitting: Internal Medicine

## 2023-10-10 ENCOUNTER — Other Ambulatory Visit: Payer: Self-pay

## 2023-10-10 VITALS — BP 163/73 | HR 86 | Temp 97.4°F | Wt 231.0 lb

## 2023-10-10 DIAGNOSIS — Z23 Encounter for immunization: Secondary | ICD-10-CM

## 2023-10-10 DIAGNOSIS — T8454XD Infection and inflammatory reaction due to internal left knee prosthesis, subsequent encounter: Secondary | ICD-10-CM | POA: Diagnosis not present

## 2023-10-10 MED ORDER — CEFADROXIL 500 MG PO CAPS: 500.0000 mg | ORAL_CAPSULE | Freq: Two times a day (BID) | ORAL | 3 refills | Status: AC

## 2023-10-10 NOTE — Progress Notes (Signed)
 Patient ID: Shelly [name], female   DOB: May 27, 1940, 83 y.o.   MRN: [id number]  HPI [name] is a 83yo F who is being treated for left knee pji on chronic suppression with cefadroxil .  She we last saw her she had an Ulcer on leg - hoyt. Now improved. Edema is slightly improved with compression socks  Otherwise 12 point ros is otherwise negative   Outpatient Encounter Medications as of 10/10/2023  Medication Sig   acetaminophen  (TYLENOL ) 650 MG CR tablet Take 650 mg by mouth every 8 (eight) hours as needed for pain.   albuterol  (VENTOLIN  HFA) 108 (90 Base) MCG/ACT inhaler Inhale 2 puffs into the lungs every 6 (six) hours as needed for shortness of breath.   carvedilol  (COREG ) 3.125 MG tablet Take 1 tablet (3.125 mg total) by mouth 2 (two) times daily. Dose reduction per patientTake 3.125 mg by mouth 2 (two) times daily. Dose reduction per patient   cefadroxil  (DURICEF) 500 MG capsule Take 1 capsule (500 mg total) by mouth 2 (two) times daily.   cetirizine (ZYRTEC) 10 MG tablet Take 10 mg by mouth daily as needed for allergies.   cholecalciferol (VITAMIN D3) 25 MCG (1000 UNIT) tablet Take 1,000 Units by mouth daily.   Cyanocobalamin  (B-12 COMPLIANCE INJECTION) 1000 MCG/ML KIT Inject 1 mL as directed every 30 (thirty) days.   fluconazole  (DIFLUCAN ) 150 MG tablet Take 1 tablet (150 mg total) by mouth once a week.   Iron , Ferrous Sulfate , 325 (65 Fe) MG TABS Take 325 mg by mouth every other day.   lidocaine  (XYLOCAINE ) 5 % ointment Apply 1 Application topically 3 (three) times daily as needed.   methocarbamol  (ROBAXIN ) 500 MG tablet Take 1 tablet (500 mg total) by mouth every 8 (eight) hours as needed for muscle spasms.   Multiple Vitamins-Minerals (PRESERVISION AREDS PO) Take 1 tablet by mouth 2 (two) times daily.   nystatin  (MYCOSTATIN /NYSTOP ) powder Apply 1 Application topically 2 (two) times daily.   pravastatin  (PRAVACHOL ) 10 MG tablet Take 1 tablet (10 mg total) by mouth every evening.  Take 10 mg by mouth every evening.   sertraline  (ZOLOFT ) 100 MG tablet Take 1 tablet (100 mg total) by mouth daily.   Sod Picosulfate-Mag Ox-Cit Acd (CLENPIQ ) 10-3.5-12 MG-GM -GM/175ML SOLN Take 1 kit by mouth as directed.   valsartan  (DIOVAN ) 80 MG tablet Take 1 tablet (80 mg total) by mouth daily.   No facility-administered encounter medications on file as of 10/10/2023.     Patient Active Problem List   Diagnosis Date Noted   Venous ulcer (HCC) 08/07/2023   Iron  deficiency anemia 11/18/2022   Status post revision of total replacement of left knee 09/17/2022   Infection of prosthetic left knee joint (HCC) 09/16/2022   Chronic pain of right wrist 07/18/2022   Anxiety and depression 06/04/2022   Memory changes 06/04/2022   B12 deficiency 04/17/2022   CKD (chronic kidney disease) stage 3, GFR 30-59 ml/min (HCC) 04/16/2022   Legally blind 04/16/2022   Malignant neoplasm of upper-outer quadrant of left breast in female, estrogen receptor positive (HCC) 09/18/2017   Hyperlipidemia    Diabetes mellitus (HCC)    PUD (peptic ulcer disease)    Essential hypertension 12/07/2008     Health Maintenance Due  Topic Date Due   FOOT EXAM  Never done   DTaP/Tdap/Td (1 - Tdap) Never done   Zoster Vaccines- Shingrix (1 of 2) Never done   COVID-19 Vaccine (2 - Pfizer risk series) 12/06/2021  Diabetic kidney evaluation - Urine ACR  04/16/2023   HEMOGLOBIN A1C  05/17/2023   INFLUENZA VACCINE  09/13/2023   Medicare Annual Wellness (AWV)  11/09/2023     Review of Systems 12 point ros is negative except what is mentioned above Physical Exam   BP (!) 163/73   Pulse 86   Temp (!) 97.4 F (36.3 C) (Oral)   Wt 231 lb (104.8 kg)   SpO2 90%   BMI 40.92 kg/m    Physical Exam  Constitutional:  oriented to person, place, and time. appears well-developed and well-nourished. No distress.  HENT: [place]/AT, PERRLA, no scleral icterus Mouth/Throat: Oropharynx is clear and moist. No oropharyngeal  exudate.  Cardiovascular: Normal rate, regular rhythm and normal heart sounds. Exam reveals no gallop and no friction rub.  No murmur heard.  Pulmonary/Chest: Effort normal and breath sounds normal. No respiratory distress.  has no wheezes.  Neck = supple, no nuchal rigidity Abdominal: Soft. Bowel sounds are normal.  exhibits no distension. There is no tenderness.  Lymphadenopathy: no cervical adenopathy. No axillary adenopathy Neurological: alert and oriented to person, place, and time.  Skin: Skin is warm and dry. No rash noted. No erythema.  Psychiatric: a normal mood and affect.  behavior is normal.    CBC Lab Results  Component Value Date   WBC 6.3 05/14/2023   RBC 4.92 05/14/2023   HGB 13.3 05/14/2023   HCT 43.8 05/14/2023   PLT 211 05/14/2023   MCV 89.0 05/14/2023   MCH 27.0 05/14/2023   MCHC 30.4 05/14/2023   RDW 14.1 05/14/2023   LYMPHSABS 0.9 05/14/2023   MONOABS 0.5 05/14/2023   EOSABS 0.2 05/14/2023    BMET Lab Results  Component Value Date   NA 136 02/15/2023   K 5.1 02/15/2023   CL 104 02/15/2023   CO2 26 02/15/2023   GLUCOSE 152 (H) 02/15/2023   BUN 48 (H) 02/15/2023   CREATININE 1.12 (H) 02/15/2023   CALCIUM 8.8 (L) 02/15/2023   GFRNONAA 49 (L) 02/15/2023   GFRAA >60 09/25/2017   Lab Results  Component Value Date   ESRSEDRATE 2 10/10/2023   Lab Results  Component Value Date   CRP 3.0 10/10/2023      Assessment and Plan Left knee pji = continue with cefadroxil  and will check Labs- sed rate and crp to see how she is doing.  Give refills  Lower extremity edema =  Has family to help with compression socks daily  Health maintenance = Will give pcv 20 today  3 months

## 2023-10-11 LAB — SEDIMENTATION RATE: Sed Rate: 2 mm/h (ref 0–30)

## 2023-10-11 LAB — C-REACTIVE PROTEIN: CRP: 3 mg/L (ref <8.0)

## 2023-10-20 ENCOUNTER — Encounter: Payer: Self-pay | Admitting: Oncology

## 2023-11-01 ENCOUNTER — Other Ambulatory Visit: Payer: Self-pay | Admitting: Family Medicine

## 2023-11-06 ENCOUNTER — Ambulatory Visit: Attending: Cardiology | Admitting: Cardiology

## 2023-11-06 ENCOUNTER — Inpatient Hospital Stay: Attending: Family Medicine

## 2023-11-06 NOTE — Progress Notes (Deleted)
 Clinical Summary Shelly [name] is a 83 y.o.female  seen today for follow up of the following medical problems.      1. HTN - she is compliant with meds - multiple recent provider visits 130s/80s despite being elevated today     2. Hyperlipidemia    09/2020 TC 160 TG 164 HDL 52 LDL 80  01/2021 TC 842 TG 778 HDL 42 LDL 78   04/2022 TC 843 TG 669 HDL 47 LDL 58. HgbA1c 8.3. Since this time A1c down to 5.5 , has not had repeat lipid panel.  -labs followed by pcp   3. Aortic sclerosis -05/2013 echo mean grade 10, AVA VTI 1.97 03/2021 echo mean grad 7, AVA VTI 2.34. No significant stenosis     4. CKD - followed by Dr Rachele   5. DOE - some recent symptoms of DOE with short distances - no chest pains - has had some prior LE edema, controlled with lasix  mg 20mg  qod - sedentary lifestule due to visual limitations, transporation issues - sister with CHF, mother with CHF.    2013 PFTs: moderate obstruction, diagnosed with asthma.  03/2021 echo: LVEF 60-65%, no WMAs, grade I DD, normal RV, no signficant valve pathology     - breathing is improving, has some chronic cough.    6. Legally blind       SH: oldest daughter works at [employer] Past Medical History:  Diagnosis Date   Arthritis    qwhere (07/01/2012)   Asthma    normal chest x-ray in 2010   Basal cell carcinoma of face    Breast cancer (HCC)    Cholelithiasis    Chronic lower back pain    Degenerative joint disease    Right TKA-2010; low back pain; hands and hips also affected   Exertional shortness of breath    mostly from being too fat (07/01/2012)   GERD (gastroesophageal reflux disease)    history   H/O hiatal hernia    Heart murmur    small   Hemorrhoid    HTN (hypertension)    Hyperlipidemia    Macular degeneration    both eyes (07/01/2012)   PUD (peptic ulcer disease) 2003   Upper GI bleed-gastric ulcer; gastroesophageal reflux disease   Seasonal allergies    Type II  diabetes mellitus (HCC)      Allergies  Allergen Reactions   Celebrex [Celecoxib] Other (See Comments)    Abdominal pain; history of upper GI bleed    Claritin [Loratadine] Hives and Swelling    blisters   Penicillins Hives   Percocet [Oxycodone -Acetaminophen ] Other (See Comments)    Severe constipation     Current Outpatient Medications  Medication Sig Dispense Refill   acetaminophen  (TYLENOL ) 650 MG CR tablet Take 650 mg by mouth every 8 (eight) hours as needed for pain.     albuterol  (VENTOLIN  HFA) 108 (90 Base) MCG/ACT inhaler Inhale 2 puffs into the lungs every 6 (six) hours as needed for shortness of breath.     carvedilol  (COREG ) 3.125 MG tablet Take 1 tablet (3.125 mg total) by mouth 2 (two) times daily. Dose reduction per patientTake 3.125 mg by mouth 2 (two) times daily. Dose reduction per patient 180 tablet 1   cefadroxil  (DURICEF) 500 MG capsule Take 1 capsule (500 mg total) by mouth 2 (two) times daily. 120 capsule 3   cetirizine (ZYRTEC) 10 MG tablet Take 10 mg by mouth daily as needed for allergies.  cholecalciferol (VITAMIN D3) 25 MCG (1000 UNIT) tablet Take 1,000 Units by mouth daily.     Cyanocobalamin  (B-12 COMPLIANCE INJECTION) 1000 MCG/ML KIT Inject 1 mL as directed every 30 (thirty) days. 3 kit 4   fluconazole  (DIFLUCAN ) 150 MG tablet Take 1 tablet (150 mg total) by mouth once a week. 4 tablet 0   Iron , Ferrous Sulfate , 325 (65 Fe) MG TABS Take 325 mg by mouth every other day. 45 tablet 1   lidocaine  (XYLOCAINE ) 5 % ointment Apply 1 Application topically 3 (three) times daily as needed. 50 g 0   methocarbamol  (ROBAXIN ) 500 MG tablet Take 1 tablet (500 mg total) by mouth every 8 (eight) hours as needed for muscle spasms. 20 tablet 0   Multiple Vitamins-Minerals (PRESERVISION AREDS PO) Take 1 tablet by mouth 2 (two) times daily.     nystatin  (MYCOSTATIN /NYSTOP ) powder Apply 1 Application topically 2 (two) times daily. 60 g 1   pravastatin  (PRAVACHOL ) 10 MG  tablet Take 1 tablet (10 mg total) by mouth every evening. Take 10 mg by mouth every evening. 90 tablet 3   sertraline  (ZOLOFT ) 100 MG tablet Take 1 tablet (100 mg total) by mouth daily. 90 tablet 3   Sod Picosulfate-Mag Ox-Cit Acd (CLENPIQ ) 10-3.5-12 MG-GM -GM/175ML SOLN Take 1 kit by mouth as directed. 175 mL 0   valsartan  (DIOVAN ) 80 MG tablet Take 1 tablet (80 mg total) by mouth daily. 90 tablet 3   No current facility-administered medications for this visit.     Past Surgical History:  Procedure Laterality Date   APPENDECTOMY     BIOPSY  02/20/2023   Procedure: BIOPSY;  Surgeon: Shaaron Lamar HERO, MD;  [facility]: [facility];  Service: Endoscopy;;   BREAST LUMPECTOMY Left    BREAST LUMPECTOMY WITH RADIOACTIVE SEED LOCALIZATION Left 10/21/2017   Procedure: BREAST LUMPECTOMY WITH RADIOACTIVE SEED LOCALIZATION;  Surgeon: Gail Favorite, MD;  Location: [facility];  Service: General;  Laterality: Left;   CATARACT EXTRACTION BILATERAL W/ ANTERIOR VITRECTOMY Bilateral    COLONOSCOPY WITH PROPOFOL  N/A 02/20/2023   Procedure: COLONOSCOPY WITH PROPOFOL ;  Surgeon: Shaaron Lamar HERO, MD;  [facility];  Service: Endoscopy;  Laterality: N/A;  10:00am, asa 3   ESOPHAGOGASTRODUODENOSCOPY (EGD) WITH PROPOFOL  N/A 02/20/2023   Procedure: ESOPHAGOGASTRODUODENOSCOPY (EGD) WITH PROPOFOL ;  Surgeon: Shaaron Lamar HERO, MD;  [facility]: [facility];  Service: Endoscopy;  Laterality: N/A;   EYE SURGERY Bilateral    laser in eyes to stop bleeding and injections for macular degeneration (07/01/2012)   FOOT SURGERY Bilateral    straighten 1st toe with a wedge.   KNEE ARTHROSCOPY Bilateral    POLYPECTOMY  02/20/2023   Procedure: POLYPECTOMY;  Surgeon: Shaaron Lamar HERO, MD;  [facility]: [facility];  Service: Endoscopy;;   SKIN CANCER EXCISION  2013   face (07/01/2012)   TOTAL KNEE ARTHROPLASTY Right 2011   TOTAL KNEE ARTHROPLASTY Left 07/01/2012   TOTAL KNEE ARTHROPLASTY Left 07/01/2012    Procedure: LEFT TOTAL KNEE ARTHROPLASTY;  Surgeon: Maude LELON Right, MD;  Location: [facility];  Service: Orthopedics;  Laterality: Left;  Left Total Knee Arthroplasty   TOTAL KNEE REVISION Left 09/17/2022   Procedure: LEFT KNEE RESECTION ARTHROPLASTY, REVISION TOTAL KNEE ARTHROPLASTY;  Surgeon: Jerri [name] HERO, MD;  Location: [facility];  Service: Orthopedics;  Laterality: Left;     Allergies  Allergen Reactions   Celebrex [Celecoxib] Other (See Comments)    Abdominal pain; history of upper GI bleed    Claritin [  Loratadine] Hives and Swelling    blisters   Penicillins Hives   Percocet [Oxycodone -Acetaminophen ] Other (See Comments)    Severe constipation      Family History  Problem Relation Age of Onset   Hodgkin's lymphoma Mother    Cervical cancer Sister        half sister mom's side   Lung cancer Sister    Colon cancer Sister    Breast cancer Neg Hx      Social History Ms. Sambrano reports that she quit smoking about 42 years ago. Her smoking use included cigarettes. She started smoking about 69 years ago. She has a 67.5 pack-year smoking history. She has never used smokeless tobacco. Ms. Lavin reports no history of alcohol use.   Review of Systems CONSTITUTIONAL: No weight loss, fever, chills, weakness or fatigue.  HEENT: Eyes: No visual loss, blurred vision, double vision or yellow sclerae.No hearing loss, sneezing, congestion, runny nose or sore throat.  SKIN: No rash or itching.  CARDIOVASCULAR:  RESPIRATORY: No shortness of breath, cough or sputum.  GASTROINTESTINAL: No anorexia, nausea, vomiting or diarrhea. No abdominal pain or blood.  GENITOURINARY: No burning on urination, no polyuria NEUROLOGICAL: No headache, dizziness, syncope, paralysis, ataxia, numbness or tingling in the extremities. No change in bowel or bladder control.  MUSCULOSKELETAL: No muscle, back pain, joint pain or stiffness.  LYMPHATICS: No enlarged nodes. No history of splenectomy.  PSYCHIATRIC: No  history of depression or anxiety.  ENDOCRINOLOGIC: No reports of sweating, cold or heat intolerance. No polyuria or polydipsia.  SABRA   Physical Examination There were no vitals filed for this visit. There were no vitals filed for this visit.  Gen: resting comfortably, no acute distress HEENT: no scleral icterus, pupils equal round and reactive, no palptable cervical adenopathy,  CV Resp: Clear to auscultation bilaterally GI: abdomen is soft, non-tender, non-distended, normal bowel sounds, no hepatosplenomegaly MSK: extremities are warm, no edema.  Skin: warm, no rash Neuro:  no focal deficits Psych: appropriate affect   Diagnostic Studies  05/2012 Carotid US  IMPRESSION:  Minimal amount of bilateral intimal wall thickening, not definitely resulting in hemodynamically significant stenosis.     06/2012 Venous LE US  No evidence of deep vein thrombosis involving the left lower extremity. - No evidence of Baker's cyst on the left. Other specific details can be found in the table(s) above. Prepared and Electronically Authenticated by     05/2013 echo Study Conclusions   - Left ventricle: The cavity size was normal. Wall thickness    was increased in a pattern of mild LVH. Systolic function    was normal. The estimated ejection fraction was in the    range of 60% to 65%. Wall motion was normal; there were no    regional wall motion abnormalities. Left ventricular    diastolic function parameters were normal.  - Aortic valve: Mildly calcified annulus. Trileaflet; mildly    thickened leaflets. Mildly increased transvalvular    velocity. Trivial regurgitation. Peak velocity:211cm/s    (S). Mean gradient:40mm Hg (S). Valve area: 1.97cm^2(VTI).  - Mitral valve: Mild mitral annular calcification.  - Left atrium: The atrium was mildly dilated.      03/24/2021 echo  1. Left ventricular ejection fraction, by estimation, is 60 to 65%. The  left ventricle has normal function. The left  ventricle has no regional  wall motion abnormalities. There is mild asymmetric left ventricular  hypertrophy of the basal-septal segment.  Left ventricular diastolic parameters are consistent with Grade I  diastolic dysfunction (impaired relaxation).   2. Right ventricular systolic function is normal. The right ventricular  size is normal. There is normal pulmonary artery systolic pressure.   3. The mitral valve is normal in structure. No evidence of mitral valve  regurgitation. No evidence of mitral stenosis.   4. The aortic valve is normal in structure. Aortic valve regurgitation is  not visualized. No aortic stenosis is present.   5. The inferior vena cava is normal in size with greater than 50%  respiratory variability, suggesting right atrial pressure of 3 mmHg.      Assessment and Plan   1. DOE -benign cardiac testing - likely multifactorial with asthma, obesity, deconditioning as activities are limited by severe vision deficit.  - reports in general breathing has been improving, continue to monitor   2.HTN - - multiple recent provider visits 130s/80s despite being elevated today - come Dec 13 for bp check, if remains elevated would increase her valsartan .    3. Hyperlipidemia - LDL at goal, TGs were elevated in setting of HgbA1c up to 8. This has since come down to 5.5, would think on repeat lipids that TGs will improve - continue current meds   EKG today shows SR, PACs     Dorn PHEBE Ross, M.D., F.A.C.C.

## 2023-11-09 ENCOUNTER — Emergency Department (HOSPITAL_COMMUNITY)
Admission: EM | Admit: 2023-11-09 | Discharge: 2023-11-10 | Disposition: A | Discharge status: Home / Self Care | Attending: Emergency Medicine | Admitting: Emergency Medicine

## 2023-11-09 ENCOUNTER — Other Ambulatory Visit: Payer: Self-pay

## 2023-11-09 ENCOUNTER — Encounter (HOSPITAL_COMMUNITY): Payer: Self-pay | Admitting: Emergency Medicine

## 2023-11-09 DIAGNOSIS — R040 Epistaxis: Secondary | ICD-10-CM | POA: Insufficient documentation

## 2023-11-09 DIAGNOSIS — Z79899 Other long term (current) drug therapy: Secondary | ICD-10-CM | POA: Insufficient documentation

## 2023-11-09 MED ORDER — OXYMETAZOLINE HCL 0.05 % NA SOLN
1.0000 | Freq: Once | NASAL | Status: AC
  Administered 2023-11-09: 1 via NASAL
  Filled 2023-11-09: qty 30

## 2023-11-09 NOTE — ED Notes (Signed)
 ED Provider at bedside.

## 2023-11-09 NOTE — ED Provider Notes (Signed)
 [place] EMERGENCY DEPARTMENT AT [facility]  Provider Note  CSN: [id number] Arrival date & time: 11/09/23 2317  History Chief Complaint  Patient presents with  . Epistaxis    ZAHRAH SUTHERLIN is a 83 y.o. female here with family for evaluation of nosebleed, primarily left side for the last 2 hours, not improved with tampon packing placed at home. Not on blood thinners. No trauma.    Home Medications Prior to Admission medications   Medication Sig Start Date End Date Taking? Authorizing Provider  acetaminophen  (TYLENOL ) 650 MG CR tablet Take 650 mg by mouth every 8 (eight) hours as needed for pain.    [provider]  albuterol  (VENTOLIN  HFA) 108 (90 Base) MCG/ACT inhaler Inhale 2 puffs into the lungs every 6 (six) hours as needed for shortness of breath.    [provider]  carvedilol  (COREG ) 3.125 MG tablet Take 1 tablet (3.125 mg total) by mouth 2 (two) times daily. Dose reduction per patientTake 3.125 mg by mouth 2 (two) times daily. Dose reduction per patient 05/15/23   Cook, Jayce G, DO  cefadroxil  (DURICEF) 500 MG capsule Take 1 capsule (500 mg total) by mouth 2 (two) times daily. 10/10/23   Luiz Channel, MD  cetirizine (ZYRTEC) 10 MG tablet Take 10 mg by mouth daily as needed for allergies.    [provider]  cholecalciferol (VITAMIN D3) 25 MCG (1000 UNIT) tablet Take 1,000 Units by mouth daily.    [provider]  Cyanocobalamin  (B-12 COMPLIANCE INJECTION) 1000 MCG/ML KIT Inject 1 mL as directed every 30 (thirty) days. 05/20/23   Cook, Jayce G, DO  fluconazole  (DIFLUCAN ) 150 MG tablet Take 1 tablet (150 mg total) by mouth once a week. 12/17/22   Cook, Jayce G, DO  Iron , Ferrous Sulfate , 325 (65 Fe) MG TABS Take 325 mg by mouth every other day. 11/16/22   Cook, Jayce G, DO  lidocaine  (XYLOCAINE ) 5 % ointment Apply 1 Application topically 3 (three) times daily as needed. 05/21/22   Raspet, Erin K, PA-C  methocarbamol  (ROBAXIN ) 500 MG  tablet Take 1 tablet (500 mg total) by mouth every 8 (eight) hours as needed for muscle spasms. 05/22/23   Luiz Channel, MD  Multiple Vitamins-Minerals (PRESERVISION AREDS PO) Take 1 tablet by mouth 2 (two) times daily.    [provider]  nystatin  (MYCOSTATIN /NYSTOP ) powder Apply 1 Application topically 2 (two) times daily. 02/11/23   Cook, Jayce G, DO  pravastatin  (PRAVACHOL ) 10 MG tablet Take 1 tablet (10 mg total) by mouth every evening. Take 10 mg by mouth every evening. 05/15/23   Cook, Jayce G, DO  sertraline  (ZOLOFT ) 100 MG tablet Take 1 tablet (100 mg total) by mouth daily. 11/04/23   Cook, Jayce G, DO  Sod Picosulfate-Mag Ox-Cit Acd (CLENPIQ ) 10-3.5-12 MG-GM -GM/175ML SOLN Take 1 kit by mouth as directed. 01/15/23   Rourk, Lamar HERO, MD  valsartan  (DIOVAN ) 80 MG tablet Take 1 tablet (80 mg total) by mouth daily. 05/15/23   Cook, Jayce G, DO     Allergies    Celebrex [celecoxib], Claritin [loratadine], Penicillins, and Percocet [oxycodone -acetaminophen ]   Review of Systems   Review of Systems Please see HPI for pertinent positives and negatives  Physical Exam BP (!) 145/71   Pulse 85   Temp 98.2 F (36.8 C) (Axillary)   Resp (!) 22   Ht 5' 3 (1.6 m)   Wt 104.8 kg   SpO2 90%   BMI 40.93 kg/m  Physical Exam Vitals and nursing note reviewed.  HENT:     Head: Normocephalic.     Nose:     Comments: Blood and clots in both nares but primarily in left side Eyes:     Extraocular Movements: Extraocular movements intact.  Pulmonary:     Effort: Pulmonary effort is normal.  Musculoskeletal:        General: Normal range of motion.     Cervical back: Neck supple.  Skin:    Findings: No rash (on exposed skin).  Neurological:     Mental Status: She is alert and oriented to person, place, and time.  Psychiatric:        Mood and Affect: Mood normal.     ED Results / Procedures / Treatments   EKG None  Procedures Procedures  Medications Ordered in the  ED Medications  oxymetazoline (AFRIN) 0.05 % nasal spray 1 spray (1 spray Each Nare Given 11/09/23 2354)  silver nitrate applicators applicator 1 Stick (1 Stick Topical Given 11/10/23 0031)  silver nitrate applicators 75-25 % applicator (  Given 11/10/23 0036)    Initial Impression and Plan  Patient here with anterior nose bleed, not on thinners. Initially BP mildly elevated, improved on recheck. Home packing removed, clots blown out, afrin instilled and pressure being held. Will recheck after 10-37min of pressure.   ED Course   Clinical Course as of 11/10/23 0107  [place] Nov 10, 2023  0017 On recheck, bleeding slowed, but the source appears to be left anterior septum. Cauterized with silver nitrate, will continue to monitor for re-bleeding.  [CS]  0105 Patient remains hemostatic and is ready to go home. Recommend home management if bleeding returns. If unable to get it to stop, RTED for re-evaluation. Patient with borderline low SpO2 but improves with deep breath and not complaining of SOB.  [CS]    Clinical Course User Index [CS] Roselyn Carlin NOVAK, MD     MDM Rules/Calculators/A&P Medical Decision Making Problems Addressed: Epistaxis: acute illness or injury  Risk OTC drugs. Prescription drug management.     Final Clinical Impression(s) / ED Diagnoses Final diagnoses:  Epistaxis    Rx / DC Orders ED Discharge Orders     None        Roselyn Carlin NOVAK, MD 11/10/23 [phone]

## 2023-11-09 NOTE — ED Triage Notes (Addendum)
 Pt BIB family from home c/o nosebleed for about the past 2 hours. Pt states she blew her nose and it just started bleeding. Pt denies thinners.

## 2023-11-10 MED ORDER — HYDROGEN PEROXIDE 3 % EX SOLN
CUTANEOUS | Status: AC
  Filled 2023-11-10: qty 473

## 2023-11-10 MED ORDER — SILVER NITRATE-POT NITRATE 75-25 % EX MISC
CUTANEOUS | Status: AC
  Filled 2023-11-10: qty 10

## 2023-11-10 MED ORDER — SILVER NITRATE-POT NITRATE 75-25 % EX MISC
1.0000 | Freq: Once | CUTANEOUS | Status: AC
Start: 2023-11-10 — End: 2023-11-10
  Administered 2023-11-10: 1 via TOPICAL
  Filled 2023-11-10: qty 10

## 2023-11-13 ENCOUNTER — Encounter: Payer: Self-pay | Admitting: Family Medicine

## 2023-11-13 ENCOUNTER — Inpatient Hospital Stay: Admitting: Physician Assistant

## 2023-11-13 ENCOUNTER — Ambulatory Visit (INDEPENDENT_AMBULATORY_CARE_PROVIDER_SITE_OTHER): Admitting: Family Medicine

## 2023-11-13 VITALS — BP 140/84 | HR 98 | Ht 63.0 in | Wt 236.0 lb

## 2023-11-13 DIAGNOSIS — R413 Other amnesia: Secondary | ICD-10-CM

## 2023-11-13 DIAGNOSIS — E785 Hyperlipidemia, unspecified: Secondary | ICD-10-CM

## 2023-11-13 DIAGNOSIS — D509 Iron deficiency anemia, unspecified: Secondary | ICD-10-CM

## 2023-11-13 DIAGNOSIS — R443 Hallucinations, unspecified: Secondary | ICD-10-CM | POA: Diagnosis not present

## 2023-11-13 DIAGNOSIS — T8454XS Infection and inflammatory reaction due to internal left knee prosthesis, sequela: Secondary | ICD-10-CM

## 2023-11-13 DIAGNOSIS — I878 Other specified disorders of veins: Secondary | ICD-10-CM | POA: Diagnosis not present

## 2023-11-13 DIAGNOSIS — N183 Chronic kidney disease, stage 3 unspecified: Secondary | ICD-10-CM

## 2023-11-13 DIAGNOSIS — R040 Epistaxis: Secondary | ICD-10-CM

## 2023-11-13 DIAGNOSIS — E1122 Type 2 diabetes mellitus with diabetic chronic kidney disease: Secondary | ICD-10-CM | POA: Diagnosis not present

## 2023-11-13 DIAGNOSIS — Z780 Asymptomatic menopausal state: Secondary | ICD-10-CM | POA: Diagnosis not present

## 2023-11-13 NOTE — Patient Instructions (Signed)
 Labs.  Referrals placed.  CT ordered.  Follow up in 1 month

## 2023-11-14 ENCOUNTER — Ambulatory Visit (HOSPITAL_COMMUNITY)

## 2023-11-14 ENCOUNTER — Ambulatory Visit: Payer: Self-pay | Admitting: Family Medicine

## 2023-11-14 DIAGNOSIS — R040 Epistaxis: Secondary | ICD-10-CM | POA: Insufficient documentation

## 2023-11-14 DIAGNOSIS — I878 Other specified disorders of veins: Secondary | ICD-10-CM | POA: Insufficient documentation

## 2023-11-14 DIAGNOSIS — R443 Hallucinations, unspecified: Secondary | ICD-10-CM | POA: Insufficient documentation

## 2023-11-14 NOTE — Progress Notes (Signed)
 Subjective:  Patient ID: Shelly [name], female    DOB: [birth year]  Age: 83 y.o. MRN: [id number]  CC:   Chief Complaint  Patient presents with   Medical Management of Chronic Issues    OPTUM CARE GAPS: Dm with Comp, arrhythmias, heart failure, breast cancer, morbid obesity, osteoporosis, use of albuterol    Hallucinations   Epistaxis    HPI:  83 year old female presents for follow up. Multiple issues/concerns today. She is accompanied by her daughter and granddaughter today.  Patient having recurrent nose bleeds. Was seen in the ED for this on 9/27. She has had 3 since then. Will discuss treatment and referral to ENT.  Has venous stasis. Did well after seeing wound care. Now issue has recurred. She is having LE edema and weeping. Wearing compression. Daughter would like her to see wound care at [facility]. Did well with Unna boots.  Per daughter and granddaughter, since earlier in the summer (~ August) patient has been reporting things that are odd and out of character. She told them recently that she saw 2 men that she did not know sleeping in a bed in her house. She has also told them that her neighbor has been on her property planting flowers/plants. Additionally, she recently was riding in the car with her grandson and states that she went to a house across town that was identical to hers. Interestingly, patient has very poor vision due to macular degeneration.  This is very concerning and seems that she is hallucinating.  Patient Active Problem List   Diagnosis Date Noted   Recurrent epistaxis 11/14/2023   Hallucinations 11/14/2023   Venous stasis 11/14/2023   Iron  deficiency anemia 11/18/2022   Arthritis 09/30/2022   Status post revision of total replacement of left knee 09/17/2022   Anxiety and depression 06/04/2022   Memory changes 06/04/2022   B12 deficiency 04/17/2022   CKD (chronic kidney disease) stage 3, GFR 30-59 ml/min (HCC) 04/16/2022   Legally blind 04/16/2022    Morbid obesity (HCC) 07/03/2020   Vitamin D deficiency 07/03/2020   Asthma 10/15/2019   Osteoarthritis 10/15/2019   Lichen sclerosus et atrophicus 10/15/2019   Malignant neoplasm of upper-outer quadrant of left breast in female, estrogen receptor positive (HCC) 09/18/2017   Hyperlipidemia    Diabetes mellitus (HCC)    PUD (peptic ulcer disease)    Essential hypertension 12/07/2008   Age-related macular degeneration 12/07/2008    Social Hx   Social History   Socioeconomic History   Marital status: Divorced    Spouse name: Not on file   Number of children: 2   Years of education: Not on file   Highest education level: 12th grade  Occupational History   Not on file  Tobacco Use   Smoking status: Former    Current packs/day: 0.00    Average packs/day: 2.5 packs/day for 27.0 years (67.5 ttl pk-yrs)    Types: Cigarettes    Start date: 02/12/1954    Quit date: 02/12/1981    Years since quitting: 42.7   Smokeless tobacco: Never  Vaping Use   Vaping status: Never Used  Substance and Sexual Activity   Alcohol use: No   Drug use: No   Sexual activity: Not Currently  Other Topics Concern   Not on file  Social History Narrative   Lives locally. Works part time at the [employer].    Social Drivers of Health   Financial Resource Strain: Low Risk  (08/05/2023)  Overall Financial Resource Strain (CARDIA)    Difficulty of Paying Living Expenses: Not very hard  Food Insecurity: No Food Insecurity (08/05/2023)   Hunger Vital Sign    Worried About Running Out of Food in the Last Year: Never true    Ran Out of Food in the Last Year: Never true  Transportation Needs: No Transportation Needs (12/17/2022)   PRAPARE - Administrator, Civil Service (Medical): No    Lack of Transportation (Non-Medical): No  Physical Activity: Inactive (08/05/2023)   Exercise Vital Sign    Days of Exercise per Week: 0 days    Minutes of Exercise per Session: Not on  file  Stress: No Stress Concern Present (12/17/2022)   [employer] of Occupational Health - Occupational Stress Questionnaire    Feeling of Stress : Only a little  Social Connections: Unknown (08/05/2023)   Social Connection and Isolation Panel    Frequency of Communication with Friends and Family: More than three times a week    Frequency of Social Gatherings with Friends and Family: Three times a week    Attends Religious Services: Not on file    Active Member of Clubs or Organizations: No    Attends Banker Meetings: Not on file    Marital Status: Not on file    Review of Systems Per HPI  Objective:  BP (!) 140/84   Pulse 98   Ht 5' 3 (1.6 m)   Wt 236 lb (107 kg)   BMI 41.81 kg/m      11/13/2023    1:56 PM 11/10/2023    1:00 AM 11/10/2023   12:30 AM  BP/Weight  Systolic BP 140 149 145  Diastolic BP 84 71 71  Wt. (Lbs) 236    BMI 41.81 kg/m2      Physical Exam Vitals and nursing note reviewed.  Constitutional:      Appearance: Normal appearance. She is obese.  HENT:     Head: Normocephalic and atraumatic.  Cardiovascular:     Rate and Rhythm: Normal rate and regular rhythm.  Pulmonary:     Effort: Pulmonary effort is normal.     Breath sounds: Normal breath sounds.  Neurological:     Mental Status: She is alert.     Comments: Alert.  Did not know the day of the week, month, or our current location.  Psychiatric:        Mood and Affect: Mood normal.        Behavior: Behavior normal.     Lab Results  Component Value Date   WBC 6.6 11/13/2023   HGB 12.5 11/13/2023   HCT 40.5 11/13/2023   PLT 246 11/13/2023   GLUCOSE 175 (H) 11/13/2023   CHOL 140 11/13/2023   TRIG 145 11/13/2023   HDL 49 11/13/2023   LDLCALC 66 11/13/2023   ALT 13 11/13/2023   AST 18 11/13/2023   NA 140 11/13/2023   K 5.7 (H) 11/13/2023   CL 102 11/13/2023   CREATININE 1.12 (H) 11/13/2023   BUN 47 (H) 11/13/2023   CO2 25 11/13/2023   INR 0.98 06/25/2012    HGBA1C 7.4 (H) 11/13/2023     Assessment & Plan:  Recurrent epistaxis Assessment & Plan: Advised compression with occurrence of nose bleed. Afrin as needed. Regular use of nasal saline.  Referring to ENT.  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 DDx: Dementia, related to visual impairment, mass/tumor, infection, metabolic derangement. Does not appear to be related to medications (no recent change).  Labs today. Urgent CT head.  Orders: -     CT HEAD WO CONTRAST ( ) -     Urinalysis -     Urine Culture  Memory changes -     CT HEAD WO CONTRAST ( ) -     Urinalysis -     Urine Culture  Venous stasis Assessment & Plan: Needs Unna boots. Referring to Wound care.  Orders: -     AMB referral to wound care center    Follow-up:  1 month (maybe sooner depending on labs and CT).  Jacqulyn Ahle DO [facility]

## 2023-11-14 NOTE — Assessment & Plan Note (Signed)
 New problem. DDx: Dementia, related to visual impairment, mass/tumor, infection, metabolic derangement. Does not appear to be related to medications (no recent change).  Labs today. Urgent CT head.

## 2023-11-14 NOTE — Assessment & Plan Note (Signed)
 Advised compression with occurrence of nose bleed. Afrin as needed. Regular use of nasal saline.  Referring to ENT.

## 2023-11-14 NOTE — Assessment & Plan Note (Signed)
 Needs Unna boots. Referring to Wound care.

## 2023-11-15 ENCOUNTER — Ambulatory Visit (HOSPITAL_COMMUNITY)
Admission: RE | Admit: 2023-11-15 | Discharge: 2023-11-15 | Disposition: A | Discharge status: Home / Self Care | Source: Ambulatory Visit | Attending: Family Medicine | Admitting: Family Medicine

## 2023-11-15 DIAGNOSIS — R413 Other amnesia: Secondary | ICD-10-CM | POA: Insufficient documentation

## 2023-11-15 DIAGNOSIS — R42 Dizziness and giddiness: Secondary | ICD-10-CM | POA: Diagnosis not present

## 2023-11-15 DIAGNOSIS — R443 Hallucinations, unspecified: Secondary | ICD-10-CM | POA: Diagnosis not present

## 2023-11-15 DIAGNOSIS — G9389 Other specified disorders of brain: Secondary | ICD-10-CM | POA: Diagnosis not present

## 2023-11-15 DIAGNOSIS — R4182 Altered mental status, unspecified: Secondary | ICD-10-CM | POA: Diagnosis not present

## 2023-11-15 LAB — LIPID PANEL
Chol/HDL Ratio: 2.9 ratio (ref 0.0–4.4)
Cholesterol, Total: 140 mg/dL (ref 100–199)
HDL: 49 mg/dL (ref >39)
LDL Chol Calc (NIH): 66 mg/dL (ref 0–99)
Triglycerides: 145 mg/dL (ref 0–149)
VLDL Cholesterol Cal: 25 mg/dL (ref 5–40)

## 2023-11-15 LAB — IRON,TIBC AND FERRITIN PANEL
Ferritin: 127 ng/mL (ref 15–150)
Iron Saturation: 13 % — ABNORMAL LOW (ref 15–55)
Iron: 34 ug/dL (ref 27–139)
Total Iron Binding Capacity: 270 ug/dL (ref 250–450)
UIBC: 236 ug/dL (ref 118–369)

## 2023-11-15 LAB — CMP14+EGFR
ALT: 13 IU/L (ref 0–32)
AST: 18 IU/L (ref 0–40)
Albumin: 4.1 g/dL (ref 3.7–4.7)
Alkaline Phosphatase: 133 IU/L — ABNORMAL HIGH (ref 48–129)
BUN/Creatinine Ratio: 42 — ABNORMAL HIGH (ref 12–28)
BUN: 47 mg/dL — ABNORMAL HIGH (ref 8–27)
Bilirubin Total: 0.2 mg/dL (ref 0.0–1.2)
CO2: 25 mmol/L (ref 20–29)
Calcium: 9.1 mg/dL (ref 8.7–10.3)
Chloride: 102 mmol/L (ref 96–106)
Creatinine, Ser: 1.12 mg/dL — ABNORMAL HIGH (ref 0.57–1.00)
Globulin, Total: 2.1 g/dL (ref 1.5–4.5)
Glucose: 175 mg/dL — ABNORMAL HIGH (ref 70–99)
Potassium: 5.7 mmol/L — ABNORMAL HIGH (ref 3.5–5.2)
Sodium: 140 mmol/L (ref 134–144)
Total Protein: 6.2 g/dL (ref 6.0–8.5)
eGFR: 49 mL/min/1.73 — ABNORMAL LOW (ref >59)

## 2023-11-15 LAB — MICROALBUMIN / CREATININE URINE RATIO
Creatinine, Urine: 29.1 mg/dL
Microalb/Creat Ratio: 54 mg/g{creat} — AB (ref 0–29)
Microalbumin, Urine: 15.6 ug/mL

## 2023-11-15 LAB — CBC
Hematocrit: 40.5 % (ref 34.0–46.6)
Hemoglobin: 12.5 g/dL (ref 11.1–15.9)
MCH: 27.5 pg (ref 26.6–33.0)
MCHC: 30.9 g/dL — ABNORMAL LOW (ref 31.5–35.7)
MCV: 89 fL (ref 79–97)
Platelets: 246 x10E3/uL (ref 150–450)
RBC: 4.54 x10E6/uL (ref 3.77–5.28)
RDW: 13.7 % (ref 11.7–15.4)
WBC: 6.6 x10E3/uL (ref 3.4–10.8)

## 2023-11-15 LAB — SEDIMENTATION RATE: Sed Rate: 7 mm/h (ref 0–40)

## 2023-11-15 LAB — HEMOGLOBIN A1C
Est. average glucose Bld gHb Est-mCnc: 166 mg/dL
Hgb A1c MFr Bld: 7.4 % — ABNORMAL HIGH (ref 4.8–5.6)

## 2023-11-16 LAB — URINE CULTURE

## 2023-11-18 NOTE — Telephone Encounter (Signed)
 Cook, Jayce G, DO     11/18/23  2:28 PM That is fine by me.

## 2023-11-20 ENCOUNTER — Other Ambulatory Visit: Payer: Self-pay | Admitting: Family Medicine

## 2023-11-20 DIAGNOSIS — I10 Essential (primary) hypertension: Secondary | ICD-10-CM

## 2023-11-20 DIAGNOSIS — R930 Abnormal findings on diagnostic imaging of skull and head, not elsewhere classified: Secondary | ICD-10-CM

## 2023-11-21 ENCOUNTER — Other Ambulatory Visit (HOSPITAL_COMMUNITY)
Admission: RE | Admit: 2023-11-21 | Discharge: 2023-11-21 | Disposition: A | Discharge status: Home / Self Care | Source: Ambulatory Visit | Attending: Family Medicine | Admitting: Family Medicine

## 2023-11-21 ENCOUNTER — Inpatient Hospital Stay: Attending: Oncology

## 2023-11-21 DIAGNOSIS — K649 Unspecified hemorrhoids: Secondary | ICD-10-CM | POA: Diagnosis not present

## 2023-11-21 DIAGNOSIS — E538 Deficiency of other specified B group vitamins: Secondary | ICD-10-CM | POA: Insufficient documentation

## 2023-11-21 DIAGNOSIS — N189 Chronic kidney disease, unspecified: Secondary | ICD-10-CM | POA: Insufficient documentation

## 2023-11-21 DIAGNOSIS — D500 Iron deficiency anemia secondary to blood loss (chronic): Secondary | ICD-10-CM | POA: Diagnosis not present

## 2023-11-21 DIAGNOSIS — D631 Anemia in chronic kidney disease: Secondary | ICD-10-CM | POA: Insufficient documentation

## 2023-11-21 DIAGNOSIS — Z853 Personal history of malignant neoplasm of breast: Secondary | ICD-10-CM | POA: Insufficient documentation

## 2023-11-21 DIAGNOSIS — Z79810 Long term (current) use of selective estrogen receptor modulators (SERMs): Secondary | ICD-10-CM | POA: Insufficient documentation

## 2023-11-21 DIAGNOSIS — K921 Melena: Secondary | ICD-10-CM | POA: Diagnosis not present

## 2023-11-21 DIAGNOSIS — Z23 Encounter for immunization: Secondary | ICD-10-CM | POA: Insufficient documentation

## 2023-11-21 LAB — CBC WITH DIFFERENTIAL/PLATELET
Abs Immature Granulocytes: 0.01 K/uL (ref 0.00–0.07)
Basophils Absolute: 0.1 K/uL (ref 0.0–0.1)
Basophils Relative: 2 %
Eosinophils Absolute: 0.2 K/uL (ref 0.0–0.5)
Eosinophils Relative: 4 %
HCT: 41.3 % (ref 36.0–46.0)
Hemoglobin: 12.3 g/dL (ref 12.0–15.0)
Immature Granulocytes: 0 %
Lymphocytes Relative: 15 %
Lymphs Abs: 0.7 K/uL (ref 0.7–4.0)
MCH: 27.1 pg (ref 26.0–34.0)
MCHC: 29.8 g/dL — ABNORMAL LOW (ref 30.0–36.0)
MCV: 91 fL (ref 80.0–100.0)
Monocytes Absolute: 0.4 K/uL (ref 0.1–1.0)
Monocytes Relative: 8 %
Neutro Abs: 3.4 K/uL (ref 1.7–7.7)
Neutrophils Relative %: 71 %
Platelets: 228 K/uL (ref 150–400)
RBC: 4.54 MIL/uL (ref 3.87–5.11)
RDW: 14.9 % (ref 11.5–15.5)
WBC: 4.8 K/uL (ref 4.0–10.5)
nRBC: 0 % (ref 0.0–0.2)

## 2023-11-21 LAB — IRON AND TIBC
Iron: 39 ug/dL (ref 28–170)
Saturation Ratios: 13 % (ref 10.4–31.8)
TIBC: 288 ug/dL (ref 250–450)
UIBC: 250 ug/dL

## 2023-11-21 LAB — VITAMIN B12: Vitamin B-12: 904 pg/mL (ref 180–914)

## 2023-11-21 LAB — BASIC METABOLIC PANEL WITH GFR
Anion gap: 11 (ref 5–15)
BUN: 47 mg/dL — ABNORMAL HIGH (ref 8–23)
CO2: 27 mmol/L (ref 22–32)
Calcium: 9 mg/dL (ref 8.9–10.3)
Chloride: 105 mmol/L (ref 98–111)
Creatinine, Ser: 1.22 mg/dL — ABNORMAL HIGH (ref 0.44–1.00)
GFR, Estimated: 44 mL/min — ABNORMAL LOW (ref >60)
Glucose, Bld: 153 mg/dL — ABNORMAL HIGH (ref 70–99)
Potassium: 5.1 mmol/L (ref 3.5–5.1)
Sodium: 142 mmol/L (ref 135–145)

## 2023-11-21 LAB — FERRITIN: Ferritin: 89 ng/mL (ref 11–307)

## 2023-11-22 ENCOUNTER — Ambulatory Visit (HOSPITAL_COMMUNITY)
Admission: RE | Admit: 2023-11-22 | Discharge: 2023-11-22 | Disposition: A | Discharge status: Home / Self Care | Source: Ambulatory Visit | Attending: Family Medicine | Admitting: Family Medicine

## 2023-11-22 DIAGNOSIS — Z780 Asymptomatic menopausal state: Secondary | ICD-10-CM | POA: Insufficient documentation

## 2023-11-22 NOTE — Telephone Encounter (Signed)
 Copied from CRM [id number]. Topic: Clinical - Medication Question >> Nov 22, 2023  9:31 AM Antwanette L wrote: Reason for CRM: Orland, the patient's daughter, called to inform us  that the patient is scheduled for an MRI at [facility] on 10/12. She is requesting medication for mild sedation to help the patient tolerate the procedure. Gia can be contacted at [phone]

## 2023-11-24 ENCOUNTER — Ambulatory Visit (HOSPITAL_COMMUNITY)
Admission: RE | Admit: 2023-11-24 | Discharge: 2023-11-24 | Disposition: A | Discharge status: Home / Self Care | Source: Ambulatory Visit | Attending: Family Medicine | Admitting: Family Medicine

## 2023-11-24 ENCOUNTER — Other Ambulatory Visit: Payer: Self-pay | Admitting: Family Medicine

## 2023-11-24 DIAGNOSIS — R930 Abnormal findings on diagnostic imaging of skull and head, not elsewhere classified: Secondary | ICD-10-CM | POA: Diagnosis not present

## 2023-11-24 DIAGNOSIS — D1779 Benign lipomatous neoplasm of other sites: Secondary | ICD-10-CM

## 2023-11-24 MED ORDER — DIAZEPAM 5 MG PO TABS: 5.0000 mg | ORAL_TABLET | Freq: Once | ORAL | 0 refills | Status: AC

## 2023-11-27 ENCOUNTER — Ambulatory Visit: Payer: Self-pay | Admitting: Family Medicine

## 2023-11-28 ENCOUNTER — Inpatient Hospital Stay: Admitting: Oncology

## 2023-11-28 VITALS — BP 135/77 | HR 82 | Temp 98.1°F | Resp 18 | Ht 65.0 in | Wt 230.0 lb

## 2023-11-28 DIAGNOSIS — R0602 Shortness of breath: Secondary | ICD-10-CM | POA: Diagnosis not present

## 2023-11-28 DIAGNOSIS — D500 Iron deficiency anemia secondary to blood loss (chronic): Secondary | ICD-10-CM

## 2023-11-28 DIAGNOSIS — E538 Deficiency of other specified B group vitamins: Secondary | ICD-10-CM

## 2023-11-28 LAB — METHYLMALONIC ACID, SERUM: Methylmalonic Acid, Quantitative: 465 nmol/L — ABNORMAL HIGH (ref 0–378)

## 2023-11-28 MED ORDER — ALBUTEROL SULFATE HFA 108 (90 BASE) MCG/ACT IN AERS: 2.0000 | INHALATION_SPRAY | Freq: Four times a day (QID) | RESPIRATORY_TRACT | 0 refills | Status: AC | PRN

## 2023-11-28 MED ORDER — CYANOCOBALAMIN 1000 MCG/ML IJ KIT: 1.0000 mL | PACK | INTRAMUSCULAR | 4 refills | Status: AC

## 2023-11-28 MED ORDER — CYANOCOBALAMIN 1000 MCG/ML IJ SOLN: 1000.0000 ug | INTRAMUSCULAR | 0 refills | Status: AC

## 2023-11-28 NOTE — Assessment & Plan Note (Addendum)
 Has received IV iron  in the past last given on 12/14/2022 Monoferric  1 g. Reports occasional bright red blood per rectum and feels like it is from her hemorrhoids and she has been having nosebleeds.  She was seen at [facility], ED for epistaxis she could not get to stop and needed to be cauterized. Patient recently had colonoscopy 02/20/2023 for heme positive stool which showed diverticuli several polyps in the cecum and nonbleeding internal hemorrhoids.  Pathology was unremarkable.  EGD showed gastric hemorrhage erosion. Reports she has noticed bright red blood per rectum when she wipes that is due to hemorrhoidal bleeding.  No melena or hematochezia. Repeat labs from 11/21/2023 show a decline in her ferritin with low iron  saturations.  Hemoglobin is normal at 12.3 normal differential. Recommend additional dose of IV iron  if patient is agreeable. Recheck labs in 3 months and see us  back in 6 months with lab work. Will continue to monitor her levels.  If she continues to significantly drop, would recommend follow-up with GI again.

## 2023-11-28 NOTE — Assessment & Plan Note (Addendum)
 Patient receives B12 shots monthly. Most recent labs show an elevated MMA with a B12 level of 900. Discussed increasing B12 shots to every other week for the next 2 months followed by monthly.  Will recheck her labs in approximately 3 months. New Rx sent to pharmacy.

## 2023-11-28 NOTE — Progress Notes (Signed)
 [facility] OFFICE PROGRESS NOTE  Cook, Shelly G, DO  ASSESSMENT & PLAN:   Assessment & Plan Iron  deficiency anemia due to chronic blood loss Has received IV iron  in the past last given on 12/14/2022 Monoferric  1 [name]. Reports occasional bright red blood per rectum and feels like it is from her hemorrhoids and she has been having nosebleeds.  She was seen at [facility], ED for epistaxis she could not get to stop and needed to be cauterized. Patient recently had colonoscopy 02/20/2023 for heme positive stool which showed diverticuli several polyps in the cecum and nonbleeding internal hemorrhoids.  Pathology was unremarkable.  EGD showed gastric hemorrhage erosion. Reports she has noticed bright red blood per rectum when she wipes that is due to hemorrhoidal bleeding.  No melena or hematochezia. Repeat labs from 11/21/2023 show a decline in her ferritin with low iron  saturations.  Hemoglobin is normal at 12.3 normal differential. Recommend additional dose of IV iron  if patient is agreeable. Recheck labs in 3 months and see us  back in 6 months with lab work. Will continue to monitor her levels.  If she continues to significantly drop, would recommend follow-up with GI again. SOB (shortness of breath) Hx of asthma.  Has been trying to get in touch with PCP but he has not responded.  Needs albuterol  inhaler refilled.  Will send prescription.  Vitamin B12 deficiency disease Patient receives B12 shots monthly. Most recent labs show an elevated MMA with a B12 level of 900. Discussed increasing B12 shots to every other week for the next 2 months followed by monthly.  Will recheck her labs in approximately 3 months. New Rx sent to pharmacy.  Orders Placed This Encounter  Procedures   Ferritin    Standing Status:   Future    Expected Date:   06/06/2024    Expiration Date:   11/27/2024    Release to patient:   Immediate   Iron  and TIBC    Standing Status:   Future    Expected Date:    06/06/2024    Expiration Date:   11/27/2024    Release to patient:   Immediate   CBC with Differential/Platelet    Standing Status:   Future    Expected Date:   06/06/2024    Expiration Date:   11/27/2024    Release to patient:   Immediate   Basic metabolic panel with GFR    Standing Status:   Future    Expected Date:   05/28/2024    Expiration Date:   11/27/2024   Methylmalonic acid, serum    Standing Status:   Future    Expected Date:   05/28/2024    Expiration Date:   11/27/2024   Vitamin B12    Standing Status:   Future    Expected Date:   05/28/2024    Expiration Date:   11/27/2024   Vitamin B12    Standing Status:   Future    Expected Date:   02/28/2024    Expiration Date:   05/28/2024   Methylmalonic acid, serum    Standing Status:   Future    Expected Date:   02/28/2024    Expiration Date:   05/28/2024   Basic metabolic panel with GFR    Standing Status:   Future    Expected Date:   02/28/2024    Expiration Date:   05/28/2024   CBC with Differential/Platelet    Standing Status:   Future  Expected Date:   02/28/2024    Expiration Date:   05/28/2024    Release to patient:   Immediate   Iron  and TIBC    Standing Status:   Future    Expected Date:   02/28/2024    Expiration Date:   05/28/2024    Release to patient:   Immediate   Ferritin    Standing Status:   Future    Expected Date:   02/28/2024    Expiration Date:   05/28/2024    Release to patient:   Immediate    INTERVAL HISTORY: Patient returns for follow-up for anemia secondary to iron  deficiency and CKD.  Patient denies recent hospitalizations, surgeries or changes to her baseline health.  She was evaluated on 11/09/2023 for epistaxis-left side.  Left anterior septum cauterized with silver nitrate.  Has had 3 additional nosebleeds since.  She was seen by her PCP who referred her to ENT.  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  She  continued with cefadroxil .  Had some mild lower extremity edema compression stockings.  This appears to be helping.  Patient has venous stasis ulcer and was referred back for wound care because of some new lower extremity swelling with weeping.  Patient has been having some hallucinations prompting CT of her brain and then MRI of brain which she had done yesterday.  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  No further evaluation or follow-up is needed.  There are multiple T2 hyperintensities with the cerebral white matter.  These are nonspecific and of uncertain etiology or clinical significance.  Patient presents today with her daughter who provides most of history.  Reports she thinks her mother has had dementia for going on 5 years now and it is slightly worsening and she is now having hallucinations.  Reports 2 men that sleep in her spare bedroom but she has not been able to talk to them.  Reports appetite 50% with very low energy.  She has 6 out of 10 pain in her lower extremities secondary to weeping and swelling.  Has shortness of breath due to asthma and needs refill on her albuterol  inhaler but is unable to get in touch with her PCP.  Has noticed bright red blood in her stools over the last week or so.  No rectal pain.  No additional nosebleeds.  She has also been using Afrin.  Has numbness and burning both feet.  We reviewed vitamin B12, MMA, CMP, CBC, iron  panel and ferritin.  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%, PR 75%, HER-2 negative ratio 1.27 copy #1.9 T1 a N0 stage Ia   09/25/2017 Cancer Staging   Staging form: Breast, AJCC 8th Edition - Clinical:  Stage IA (cT1a, cN0, cM0, G2, ER+, PR+, HER2-) - Signed by Odean Potts, MD on 03/13/2022 Method of lymph node assessment: Clinical Histologic grading system: 3 grade system Laterality: Left Tumor size (mm): 5   10/21/2017 Surgery   10/21/17: Left Lumpectomy: Encapsulated papillary carcinoma 0.8 cm, grade 2, Margins Neg, ER 95%, PR 75%, HER-2 negative ratio 1.27 copy #1.9 T1 a N0 stage Ia   12/2017 -  Anti-estrogen  oral therapy   Tamoxifen  daily      CBC    Component Value Date/Time   WBC 4.8 11/21/2023 1552   RBC 4.54 11/21/2023 1552   HGB 12.3 11/21/2023 1552   HGB 12.5 11/13/2023 1500   HCT 41.3 11/21/2023 1552   HCT 40.5 11/13/2023 1500   PLT 228 11/21/2023 1552   PLT 246 11/13/2023 1500   MCV 91.0 11/21/2023 1552   MCV 89 11/13/2023 1500   MCH 27.1 11/21/2023 1552   MCHC 29.8 (L) 11/21/2023 1552   RDW 14.9 11/21/2023 1552   RDW 13.7 11/13/2023 1500   LYMPHSABS 0.7 11/21/2023 1552   MONOABS 0.4 11/21/2023 1552   EOSABS 0.2 11/21/2023 1552   BASOSABS 0.1 11/21/2023 1552       Latest Ref Rng & Units 11/21/2023    3:52 PM 11/13/2023    3:00 PM 02/15/2023   11:22 AM  CMP  Glucose 70 - 99 mg/dL 846  824  847   BUN 8 - 23 mg/dL 47  47  48   Creatinine 0.44 - 1.00 mg/dL 8.77  8.87  8.87   Sodium 135 - 145 mmol/L 142  140  136   Potassium 3.5 - 5.1 mmol/L 5.1  5.7  5.1   Chloride 98 - 111 mmol/L 105  102  104   CO2 22 - 32 mmol/L 27  25  26    Calcium 8.9 - 10.3 mg/dL 9.0  9.1  8.8   Total Protein 6.0 - 8.5 [name]/dL  6.2    Total Bilirubin 0.0 - 1.2 mg/dL  0.2    Alkaline Phos 48 - 129 IU/L  133    AST 0 - 40 IU/L  18    ALT 0 - 32 IU/L  13       Lab Results  Component Value Date   FERRITIN 89 11/21/2023   VITAMINB12 904 11/21/2023    Vitals:   11/28/23 1403  BP: 135/77  Pulse: 82  Resp: 18  Temp: 98.1 F (36.7 C)  SpO2: 94%    Review of System:  Review of Systems  Constitutional:  Positive for malaise/fatigue. Negative for weight loss.  HENT:  Positive  for nosebleeds.   Respiratory:  Positive for cough and shortness of breath.   Cardiovascular:  Positive for leg swelling.  Gastrointestinal:  Positive for blood in stool.  Musculoskeletal:  Positive for joint pain. Negative for falls.  Psychiatric/Behavioral:  Positive for hallucinations and memory loss. The patient has insomnia.     Physical Exam: Physical Exam Vitals reviewed: Pleasant patient sitting in wheelchair..  Constitutional:      Appearance: Normal appearance.  HENT:     Head: Normocephalic and atraumatic.  Eyes:     Pupils: Pupils are equal, round, and reactive to light.  Cardiovascular:     Rate and Rhythm: Normal rate and regular rhythm.     Heart sounds: Normal heart sounds. No murmur heard. Pulmonary:     Effort: Pulmonary effort is normal.     Breath sounds: Normal breath sounds. No wheezing.  Abdominal:     General: Bowel sounds are normal. There is no distension.     Palpations: Abdomen is soft.     Tenderness: There is no abdominal tenderness.  Musculoskeletal:        General: Normal range of motion.     Cervical back: Normal range of motion.  Skin:    General: Skin is warm and dry.     Findings: No  rash.  Neurological:     Mental Status: She is alert and oriented to person, place, and time.     Gait: Gait is intact.  Psychiatric:        Mood and Affect: Mood and affect normal.        Cognition and Memory: Memory normal.        Judgment: Judgment normal.      I spent 20 minutes dedicated to the care of this patient (face-to-face and non-face-to-face) on the date of the encounter to include what is described in the assessment and plan.,  Delon Hope, NP 11/28/2023 2:39 PM

## 2023-11-28 NOTE — Assessment & Plan Note (Addendum)
 Hx of asthma.  Has been trying to get in touch with PCP but he has not responded.  Needs albuterol  inhaler refilled.  Will send prescription.

## 2023-12-03 ENCOUNTER — Inpatient Hospital Stay

## 2023-12-03 VITALS — BP 136/75 | HR 85 | Temp 97.7°F | Resp 18

## 2023-12-03 DIAGNOSIS — D500 Iron deficiency anemia secondary to blood loss (chronic): Secondary | ICD-10-CM | POA: Diagnosis not present

## 2023-12-03 DIAGNOSIS — D509 Iron deficiency anemia, unspecified: Secondary | ICD-10-CM

## 2023-12-03 MED ORDER — SODIUM CHLORIDE 0.9 % IV SOLN
1000.0000 mg | Freq: Once | INTRAVENOUS | Status: AC
  Administered 2023-12-03: 1000 mg via INTRAVENOUS
  Filled 2023-12-03: qty 1000

## 2023-12-03 MED ORDER — SODIUM CHLORIDE 0.9 % IV SOLN: Freq: Once | INTRAVENOUS | Status: AC

## 2023-12-03 MED ORDER — INFLUENZA VAC SPLIT HIGH-DOSE 0.5 ML IM SUSY
0.5000 mL | PREFILLED_SYRINGE | Freq: Once | INTRAMUSCULAR | Status: AC
  Administered 2023-12-03: 0.5 mL via INTRAMUSCULAR
  Filled 2023-12-03: qty 0.5

## 2023-12-03 NOTE — Progress Notes (Addendum)
 Patient presents today for iron  infusion of Monoferric . Patient is in satisfactory condition with no new complaints voiced.  Vital signs are stable.  We will proceed with infusion per provider orders.    Patient given flu vaccination per protocol/request. Patient tolerated injection with well in left deltoid with no complaints voiced.  Site clean and dry with no bruising or swelling noted.  No complaints of pain.    Patient tolerated treatment well with no complaints voiced.  Patient left via wheelchair in stable condition.  Vital signs stable at discharge.  Follow up as scheduled.

## 2023-12-03 NOTE — Patient Instructions (Signed)
 [facility] - A DEPT OF [facility]  Discharge Instructions: Thank you for choosing  Cancer Center to provide your oncology and hematology care.  If you have a lab appointment with the Cancer Center - please note that after April 8th, 2024, all labs will be drawn in the cancer center.  You do not have to check in or register with the main entrance as you have in the past but will complete your check-in in the cancer center.  Wear comfortable clothing and clothing appropriate for easy access to any Portacath or PICC line.   We strive to give you quality time with your provider. You may need to reschedule your appointment if you arrive late (15 or more minutes).  Arriving late affects you and other patients whose appointments are after yours.  Also, if you miss three or more appointments without notifying the office, you may be dismissed from the clinic at the provider's discretion.      For prescription refill requests, have your pharmacy contact our office and allow 72 hours for refills to be completed.    Today you received the following Monoferric  iron .  Ferric Derisomaltose  Injection What is this medication? FERRIC DERISOMALTOSE  (FER ik der EYE soe MAWL tose) treats low levels of iron  in your body (iron  deficiency anemia). Iron  is a mineral that plays an important role in making red blood cells, which carry oxygen from your lungs to the rest of your body. This medicine may be used for other purposes; ask your health care provider or pharmacist if you have questions. COMMON BRAND NAME(S): MONOFERRIC  What should I tell my care team before I take this medication? They need to know if you have any of these conditions: High levels of iron  in the blood An unusual or allergic reaction to iron , other medications, foods, dyes, or preservatives Pregnant or trying to get pregnant Breastfeeding How should I use this medication? This medication is injected  into a vein. It is given by your care team in a hospital or clinic setting. Talk to your care team about the use of this medication in children. Special care may be needed. Overdosage: If you think you have taken too much of this medicine contact a poison control center or emergency room at once. NOTE: This medicine is only for you. Do not share this medicine with others. What if I miss a dose? It is important not to miss your dose. Call your care team if you are unable to keep an appointment. What may interact with this medication? Do not take this medication with any of the following: Deferoxamine Dimercaprol Other iron  products This list may not describe all possible interactions. Give your health care provider a list of all the medicines, herbs, non-prescription drugs, or dietary supplements you use. Also tell them if you smoke, drink alcohol, or use illegal drugs. Some items may interact with your medicine. What should I watch for while using this medication? Visit your care team for regular checks on your progress. Tell your care team if your symptoms do not start to get better or if they get worse. You may need blood work done while you are taking this medication. You may need to eat more foods that contain iron . Talk to your care team. Foods that contain iron  include whole grains or cereals, dried fruits, beans, peas, leafy green vegetables, and organ meats (liver, kidney). What side effects may I notice from receiving this medication? Side  effects that you should report to your care team as soon as possible: Allergic reactions--skin rash, itching, hives, swelling of the face, lips, tongue, or throat Low blood pressure--dizziness, feeling faint or lightheaded, blurry vision Shortness of breath Side effects that usually do not require medical attention (report to your care team if they continue or are bothersome): Flushing Headache Joint pain Muscle pain Nausea Pain, redness, or  irritation at injection site This list may not describe all possible side effects. Call your doctor for medical advice about side effects. You may report side effects to FDA at 1-800-FDA-1088. Where should I keep my medication? This medication is given in a hospital or clinic. It will not be stored at home. NOTE: This sheet is a summary. It may not cover all possible information. If you have questions about this medicine, talk to your doctor, pharmacist, or health care provider.  2024 Elsevier/Gold Standard (2022-09-19 00:00:00)   To help prevent nausea and vomiting after your treatment, we encourage you to take your nausea medication as directed.  BELOW ARE SYMPTOMS THAT SHOULD BE REPORTED IMMEDIATELY: *FEVER GREATER THAN 100.4 F (38 C) OR HIGHER *CHILLS OR SWEATING *NAUSEA AND VOMITING THAT IS NOT CONTROLLED WITH YOUR NAUSEA MEDICATION *UNUSUAL SHORTNESS OF BREATH *UNUSUAL BRUISING OR BLEEDING *URINARY PROBLEMS (pain or burning when urinating, or frequent urination) *BOWEL PROBLEMS (unusual diarrhea, constipation, pain near the anus) TENDERNESS IN MOUTH AND THROAT WITH OR WITHOUT PRESENCE OF ULCERS (sore throat, sores in mouth, or a toothache) UNUSUAL RASH, SWELLING OR PAIN  UNUSUAL VAGINAL DISCHARGE OR ITCHING   Items with * indicate a potential emergency and should be followed up as soon as possible or go to the Emergency Department if any problems should occur.  Please show the CHEMOTHERAPY ALERT CARD or IMMUNOTHERAPY ALERT CARD at check-in to the Emergency Department and triage nurse.  Should you have questions after your visit or need to cancel or reschedule your appointment, please contact [facility] [place] - A DEPT OF [facility] [phone]  and follow the prompts.  Office hours are 8:00 a.m. to 4:30 p.m. Monday - Friday. Please note that voicemails left after 4:00 p.m. may not be returned until the following business day.  We are closed weekends and  major holidays. You have access to a nurse at all times for urgent questions. Please call the main number to the clinic [phone] and follow the prompts.  For any non-urgent questions, you may also contact your provider using MyChart. We now offer e-Visits for anyone 36 and older to request care online for non-urgent symptoms. For details visit [email or web address].   Also download the MyChart app! Go to the app store, search MyChart, open the app, select [place], and log in with your MyChart username and password.

## 2023-12-09 ENCOUNTER — Encounter (HOSPITAL_BASED_OUTPATIENT_CLINIC_OR_DEPARTMENT_OTHER): Attending: General Surgery | Admitting: General Surgery

## 2023-12-09 DIAGNOSIS — I872 Venous insufficiency (chronic) (peripheral): Secondary | ICD-10-CM | POA: Insufficient documentation

## 2023-12-09 DIAGNOSIS — L97812 Non-pressure chronic ulcer of other part of right lower leg with fat layer exposed: Secondary | ICD-10-CM | POA: Diagnosis not present

## 2023-12-09 DIAGNOSIS — E11622 Type 2 diabetes mellitus with other skin ulcer: Secondary | ICD-10-CM | POA: Diagnosis not present

## 2023-12-12 ENCOUNTER — Other Ambulatory Visit: Payer: Self-pay | Admitting: Family Medicine

## 2023-12-12 DIAGNOSIS — I10 Essential (primary) hypertension: Secondary | ICD-10-CM

## 2023-12-16 ENCOUNTER — Encounter: Payer: Self-pay | Admitting: Radiology

## 2023-12-17 ENCOUNTER — Encounter (HOSPITAL_BASED_OUTPATIENT_CLINIC_OR_DEPARTMENT_OTHER): Attending: General Surgery | Admitting: General Surgery

## 2023-12-17 DIAGNOSIS — B952 Enterococcus as the cause of diseases classified elsewhere: Secondary | ICD-10-CM | POA: Diagnosis not present

## 2023-12-17 DIAGNOSIS — L97812 Non-pressure chronic ulcer of other part of right lower leg with fat layer exposed: Secondary | ICD-10-CM | POA: Insufficient documentation

## 2023-12-17 DIAGNOSIS — I872 Venous insufficiency (chronic) (peripheral): Secondary | ICD-10-CM | POA: Diagnosis not present

## 2023-12-17 DIAGNOSIS — B9689 Other specified bacterial agents as the cause of diseases classified elsewhere: Secondary | ICD-10-CM | POA: Diagnosis not present

## 2023-12-17 DIAGNOSIS — E11622 Type 2 diabetes mellitus with other skin ulcer: Secondary | ICD-10-CM | POA: Diagnosis not present

## 2023-12-17 DIAGNOSIS — L97822 Non-pressure chronic ulcer of other part of left lower leg with fat layer exposed: Secondary | ICD-10-CM | POA: Diagnosis not present

## 2023-12-18 ENCOUNTER — Ambulatory Visit (INDEPENDENT_AMBULATORY_CARE_PROVIDER_SITE_OTHER): Admitting: Family Medicine

## 2023-12-18 VITALS — BP 120/73 | HR 106 | Ht 63.0 in | Wt 235.0 lb

## 2023-12-18 DIAGNOSIS — I10 Essential (primary) hypertension: Secondary | ICD-10-CM | POA: Diagnosis not present

## 2023-12-18 DIAGNOSIS — R040 Epistaxis: Secondary | ICD-10-CM | POA: Diagnosis not present

## 2023-12-18 DIAGNOSIS — R21 Rash and other nonspecific skin eruption: Secondary | ICD-10-CM

## 2023-12-18 DIAGNOSIS — R443 Hallucinations, unspecified: Secondary | ICD-10-CM

## 2023-12-18 MED ORDER — TRIAMCINOLONE ACETONIDE 0.1 % EX CREA: 1.0000 | TOPICAL_CREAM | Freq: Two times a day (BID) | CUTANEOUS | 0 refills | Status: AC

## 2023-12-18 NOTE — Patient Instructions (Signed)
 Follow up in 3 months  Medication as directed.  Take care  Dr. Bluford

## 2023-12-23 ENCOUNTER — Encounter (HOSPITAL_BASED_OUTPATIENT_CLINIC_OR_DEPARTMENT_OTHER): Admitting: General Surgery

## 2023-12-23 DIAGNOSIS — I872 Venous insufficiency (chronic) (peripheral): Secondary | ICD-10-CM | POA: Diagnosis not present

## 2023-12-23 DIAGNOSIS — L97811 Non-pressure chronic ulcer of other part of right lower leg limited to breakdown of skin: Secondary | ICD-10-CM | POA: Diagnosis not present

## 2023-12-23 DIAGNOSIS — R21 Rash and other nonspecific skin eruption: Secondary | ICD-10-CM | POA: Insufficient documentation

## 2023-12-23 NOTE — Assessment & Plan Note (Signed)
 Stable currently

## 2023-12-23 NOTE — Assessment & Plan Note (Signed)
Triamcinolone as directed.

## 2023-12-23 NOTE — Assessment & Plan Note (Signed)
 Stable.  Continue current medications.

## 2023-12-23 NOTE — Progress Notes (Signed)
 Subjective:  Patient ID: Shelly [name], female    DOB: December 29, 1940  Age: 83 y.o. MRN: [id number]  CC:   Chief Complaint  Patient presents with   Medical Management of Chronic Issues    One month follow up    HPI:  83 year old female presents for follow up. Her family members are with her today.   Hallucinations have improved. No new hallucinations. MRI brain was benign.  Patient having significant itching of the hands. Washes hands very frequently. Often picks at the open areas.   BP well controlled.   Following closely with wound care regarding venous stasis/lower extremity wounds.  Patient Active Problem List   Diagnosis Date Noted   Rash 12/23/2023   Recurrent epistaxis 11/14/2023   Hallucinations 11/14/2023   Venous stasis 11/14/2023   Iron  deficiency anemia 11/18/2022   Arthritis 09/30/2022   Status post revision of total replacement of left knee 09/17/2022   Anxiety and depression 06/04/2022   Memory changes 06/04/2022   Vitamin B12 deficiency disease 04/17/2022   CKD (chronic kidney disease) stage 3, GFR 30-59 ml/min (HCC) 04/16/2022   Legally blind 04/16/2022   Morbid obesity (HCC) 07/03/2020   Vitamin D deficiency 07/03/2020   Asthma 10/15/2019   Osteoarthritis 10/15/2019   Lichen sclerosus et atrophicus 10/15/2019   Malignant neoplasm of upper-outer quadrant of left breast in female, estrogen receptor positive (HCC) 09/18/2017   Hyperlipidemia    Diabetes mellitus (HCC)    PUD (peptic ulcer disease)    Essential hypertension 12/07/2008   Age-related macular degeneration 12/07/2008    Social Hx   Social History   Socioeconomic History   Marital status: Divorced    Spouse name: Not on file   Number of children: 2   Years of education: Not on file   Highest education level: Some college, no degree  Occupational History   Not on file  Tobacco Use   Smoking status: Former    Current packs/day: 0.00    Average packs/day: 2.5 packs/day for 27.0  years (67.5 ttl pk-yrs)    Types: Cigarettes    Start date: 02/12/1954    Quit date: 02/12/1981    Years since quitting: 42.8   Smokeless tobacco: Never  Vaping Use   Vaping status: Never Used  Substance and Sexual Activity   Alcohol use: No   Drug use: No   Sexual activity: Not Currently  Other Topics Concern   Not on file  Social History Narrative   Lives locally. Works part time at the [employer].    Social Drivers of Corporate Investment Banker Strain: Low Risk  (12/18/2023)   Overall Financial Resource Strain (CARDIA)    Difficulty of Paying Living Expenses: Not very hard  Food Insecurity: No Food Insecurity (12/18/2023)   Hunger Vital Sign    Worried About Running Out of Food in the Last Year: Never true    Ran Out of Food in the Last Year: Never true  Transportation Needs: Unknown (12/18/2023)   PRAPARE - Administrator, Civil Service (Medical): Patient declined    Lack of Transportation (Non-Medical): No  Physical Activity: Inactive (12/18/2023)   Exercise Vital Sign    Days of Exercise per Week: 0 days    Minutes of Exercise per Session: Not on file  Stress: No Stress Concern Present (12/18/2023)   [employer] of Occupational Health - Occupational Stress Questionnaire    Feeling of Stress: Only a little  Social Connections: Socially Isolated (12/18/2023)   Social Connection and Isolation Panel    Frequency of Communication with Friends and Family: More than three times a week    Frequency of Social Gatherings with Friends and Family: Three times a week    Attends Religious Services: Never    Active Member of Clubs or Organizations: No    Attends Engineer, Structural: Not on file    Marital Status: Divorced    Review of Systems Per HPI  Objective:  BP 120/73   Pulse (!) 106   Ht 5' 3 (1.6 m)   Wt 235 lb (106.6 kg)   SpO2 94%   BMI 41.63 kg/m      12/18/2023    2:14 PM 12/03/2023   10:39 AM 12/03/2023     8:50 AM  BP/Weight  Systolic BP 120 136 132  Diastolic BP 73 75 73  Wt. (Lbs) 235    BMI 41.63 kg/m2      Physical Exam Vitals and nursing note reviewed.  Constitutional:      Appearance: Normal appearance. She is obese.  HENT:     Head: Normocephalic and atraumatic.  Pulmonary:     Effort: Pulmonary effort is normal. No respiratory distress.  Skin:    Comments: Hands. Dry skin with scattered open areas.  Neurological:     General: No focal deficit present.     Mental Status: She is alert.  Psychiatric:        Mood and Affect: Mood normal.        Behavior: Behavior normal.     Lab Results  Component Value Date   WBC 4.8 11/21/2023   HGB 12.3 11/21/2023   HCT 41.3 11/21/2023   PLT 228 11/21/2023   GLUCOSE 153 (H) 11/21/2023   CHOL 140 11/13/2023   TRIG 145 11/13/2023   HDL 49 11/13/2023   LDLCALC 66 11/13/2023   ALT 13 11/13/2023   AST 18 11/13/2023   NA 142 11/21/2023   K 5.1 11/21/2023   CL 105 11/21/2023   CREATININE 1.22 (H) 11/21/2023   BUN 47 (H) 11/21/2023   CO2 27 11/21/2023   INR 0.98 06/25/2012   HGBA1C 7.4 (H) 11/13/2023     Assessment & Plan:  Essential hypertension Assessment & Plan: Stable. Continue current medications.   Recurrent epistaxis Assessment & Plan: Stable currently.   Hallucinations Assessment & Plan: Multifactorial. Improved.   Rash Assessment & Plan: Triamcinolone as directed.   Other orders -     Triamcinolone Acetonide; Apply 1 Application topically 2 (two) times daily.  Dispense: 30 g; Refill: 0    Follow-up:  3 months  Latiffany Harwick Bluford DO [facility]

## 2023-12-23 NOTE — Assessment & Plan Note (Signed)
 Multifactorial  ?Improved ?

## 2023-12-30 ENCOUNTER — Encounter (HOSPITAL_BASED_OUTPATIENT_CLINIC_OR_DEPARTMENT_OTHER): Admitting: General Surgery

## 2023-12-30 DIAGNOSIS — L97812 Non-pressure chronic ulcer of other part of right lower leg with fat layer exposed: Secondary | ICD-10-CM | POA: Diagnosis not present

## 2023-12-30 DIAGNOSIS — I872 Venous insufficiency (chronic) (peripheral): Secondary | ICD-10-CM | POA: Diagnosis not present

## 2024-01-02 ENCOUNTER — Encounter: Payer: Self-pay | Admitting: Internal Medicine

## 2024-01-02 ENCOUNTER — Other Ambulatory Visit: Payer: Self-pay

## 2024-01-02 ENCOUNTER — Ambulatory Visit: Admitting: Internal Medicine

## 2024-01-02 VITALS — BP 123/55 | HR 101 | Temp 97.5°F | Resp 16

## 2024-01-02 DIAGNOSIS — T8454XD Infection and inflammatory reaction due to internal left knee prosthesis, subsequent encounter: Secondary | ICD-10-CM | POA: Diagnosis not present

## 2024-01-02 DIAGNOSIS — Z79899 Other long term (current) drug therapy: Secondary | ICD-10-CM

## 2024-01-02 NOTE — Progress Notes (Signed)
 RFV: follow up for left knee pji Patient ID: Shelly [name], female   DOB: 06-14-1940, 83 y.o.   MRN: [id number]  HPI Shelly [name] is an 83yo F with left knee pji on chronic suppression. Currently on cefadroxil , without issue. She is now wearing support socks to help with pedal edema .overall improving. We Last sawe her 3 months ago.  Outpatient Encounter Medications as of 01/02/2024  Medication Sig   acetaminophen  (TYLENOL ) 650 MG CR tablet Take 650 mg by mouth every 8 (eight) hours as needed for pain.   albuterol  (VENTOLIN  HFA) 108 (90 Base) MCG/ACT inhaler Inhale 2 puffs into the lungs every 6 (six) hours as needed for shortness of breath.   carvedilol  (COREG ) 3.125 MG tablet TAKE ONE TABLET BY MOUTH TWICE DAILY   cefadroxil  (DURICEF) 500 MG capsule Take 1 capsule (500 mg total) by mouth 2 (two) times daily.   cholecalciferol (VITAMIN D3) 25 MCG (1000 UNIT) tablet Take 1,000 Units by mouth daily.   Cyanocobalamin  (B-12 COMPLIANCE INJECTION) 1000 MCG/ML KIT Inject 1 mL as directed every 30 (thirty) days.   cyanocobalamin  (VITAMIN B12) 1000 MCG/ML injection Inject 1 mL (1,000 mcg total) into the muscle every 14 (fourteen) days.   Multiple Vitamins-Minerals (PRESERVISION AREDS PO) Take 1 tablet by mouth 2 (two) times daily.   nystatin  (MYCOSTATIN /NYSTOP ) powder Apply 1 Application topically 2 (two) times daily.   oxymetazoline  (AFRIN) 0.05 % nasal spray Place 1 spray into both nostrils 2 (two) times daily.   pravastatin  (PRAVACHOL ) 10 MG tablet Take 1 tablet (10 mg total) by mouth every evening. Take 10 mg by mouth every evening.   sertraline  (ZOLOFT ) 100 MG tablet Take 1 tablet (100 mg total) by mouth daily.   triamcinolone  cream (KENALOG ) 0.1 % Apply 1 Application topically 2 (two) times daily.   valsartan  (DIOVAN ) 80 MG tablet Take 1 tablet (80 mg total) by mouth daily.   cetirizine (ZYRTEC) 10 MG tablet Take 10 mg by mouth daily as needed for allergies. (Patient not taking: Reported on  01/02/2024)   methocarbamol  (ROBAXIN ) 500 MG tablet Take 1 tablet (500 mg total) by mouth every 8 (eight) hours as needed for muscle spasms. (Patient not taking: Reported on 01/02/2024)   No facility-administered encounter medications on file as of 01/02/2024.     Patient Active Problem List   Diagnosis Date Noted   Rash 12/23/2023   Recurrent epistaxis 11/14/2023   Hallucinations 11/14/2023   Venous stasis 11/14/2023   Iron  deficiency anemia 11/18/2022   Arthritis 09/30/2022   Status post revision of total replacement of left knee 09/17/2022   Anxiety and depression 06/04/2022   Memory changes 06/04/2022   Vitamin B12 deficiency disease 04/17/2022   CKD (chronic kidney disease) stage 3, GFR 30-59 ml/min (HCC) 04/16/2022   Legally blind 04/16/2022   Morbid obesity (HCC) 07/03/2020   Vitamin D deficiency 07/03/2020   Asthma 10/15/2019   Osteoarthritis 10/15/2019   Lichen sclerosus et atrophicus 10/15/2019   Malignant neoplasm of upper-outer quadrant of left breast in female, estrogen receptor positive (HCC) 09/18/2017   Hyperlipidemia    Diabetes mellitus (HCC)    PUD (peptic ulcer disease)    Essential hypertension 12/07/2008   Age-related macular degeneration 12/07/2008     Health Maintenance Due  Topic Date Due   FOOT EXAM  Never done   DTaP/Tdap/Td (1 - Tdap) Never done   Zoster Vaccines- Shingrix (1 of 2) Never done   COVID-19 Vaccine (2 - Pfizer risk series) 12/06/2021  Mammogram  01/30/2023   Medicare Annual Wellness (AWV)  11/09/2023     Review of Systems 12 point ros is otherwise negative Physical Exam   BP (!) 123/55   Pulse (!) 101   Temp (!) 97.5 F (36.4 C) (Oral)   Resp 16   SpO2 91%    Physical Exam  Constitutional:  oriented to person, place, and time. appears well-developed and well-nourished. No distress.  HENT: /AT, PERRLA, no scleral icterus Mouth/Throat: Oropharynx is clear and moist. No oropharyngeal exudate.  Cardiovascular: Normal  rate, regular rhythm and normal heart sounds. Exam reveals no gallop and no friction rub.  No murmur heard.  Pulmonary/Chest: Effort normal and breath sounds normal. No respiratory distress.  has no wheezes.  Neck = supple, no nuchal rigidity Ext: knee are not warm. Well healed incisions Lymphadenopathy: no cervical adenopathy. No axillary adenopathy Neurological: alert and oriented to person, place, and time.  Skin: Skin is warm and dry. No rash noted. No erythema.  Psychiatric: a normal mood and affect.  behavior is normal.    CBC Lab Results  Component Value Date   WBC 4.8 11/21/2023   RBC 4.54 11/21/2023   HGB 12.3 11/21/2023   HCT 41.3 11/21/2023   PLT 228 11/21/2023   MCV 91.0 11/21/2023   MCH 27.1 11/21/2023   MCHC 29.8 (L) 11/21/2023   RDW 14.9 11/21/2023   LYMPHSABS 0.7 11/21/2023   MONOABS 0.4 11/21/2023   EOSABS 0.2 11/21/2023    BMET Lab Results  Component Value Date   NA 142 11/21/2023   K 5.1 11/21/2023   CL 105 11/21/2023   CO2 27 11/21/2023   GLUCOSE 153 (H) 11/21/2023   BUN 47 (H) 11/21/2023   CREATININE 1.22 (H) 11/21/2023   CALCIUM 9.0 11/21/2023   GFRNONAA 44 (L) 11/21/2023   GFRAA >60 09/25/2017    Lab Results  Component Value Date   ESRSEDRATE 2 01/02/2024   Lab Results  Component Value Date   CRP <3.0 01/02/2024     Assessment and Plan Right knee pji = on chronic suppression. Plan for her to Continue with cefadroxil  1 tab twice a day  Long term medication management= will check Sed rate and crp   Rtc 4 mo

## 2024-01-03 LAB — C-REACTIVE PROTEIN: CRP: <3 mg/L (ref <8.0)

## 2024-01-03 LAB — SEDIMENTATION RATE: Sed Rate: 2 mm/h (ref 0–30)

## 2024-01-06 ENCOUNTER — Encounter (HOSPITAL_BASED_OUTPATIENT_CLINIC_OR_DEPARTMENT_OTHER): Admitting: General Surgery

## 2024-02-20 ENCOUNTER — Encounter: Payer: Self-pay | Admitting: Oncology

## 2024-02-20 ENCOUNTER — Other Ambulatory Visit: Payer: Self-pay | Admitting: Oncology

## 2024-02-27 ENCOUNTER — Ambulatory Visit

## 2024-02-28 ENCOUNTER — Inpatient Hospital Stay: Attending: Oncology

## 2024-02-28 ENCOUNTER — Ambulatory Visit: Payer: Self-pay | Admitting: Oncology

## 2024-02-28 DIAGNOSIS — E875 Hyperkalemia: Secondary | ICD-10-CM | POA: Insufficient documentation

## 2024-02-28 DIAGNOSIS — J45909 Unspecified asthma, uncomplicated: Secondary | ICD-10-CM | POA: Diagnosis not present

## 2024-02-28 DIAGNOSIS — D500 Iron deficiency anemia secondary to blood loss (chronic): Secondary | ICD-10-CM | POA: Diagnosis present

## 2024-02-28 DIAGNOSIS — K649 Unspecified hemorrhoids: Secondary | ICD-10-CM | POA: Diagnosis not present

## 2024-02-28 DIAGNOSIS — E538 Deficiency of other specified B group vitamins: Secondary | ICD-10-CM | POA: Insufficient documentation

## 2024-02-28 LAB — CBC WITH DIFFERENTIAL/PLATELET
Abs Immature Granulocytes: 0.02 K/uL (ref 0.00–0.07)
Basophils Absolute: 0.1 K/uL (ref 0.0–0.1)
Basophils Relative: 1 %
Eosinophils Absolute: 0.3 K/uL (ref 0.0–0.5)
Eosinophils Relative: 4 %
HCT: 46.1 % — ABNORMAL HIGH (ref 36.0–46.0)
Hemoglobin: 13.9 g/dL (ref 12.0–15.0)
Immature Granulocytes: 0 %
Lymphocytes Relative: 17 %
Lymphs Abs: 1 K/uL (ref 0.7–4.0)
MCH: 27.2 pg (ref 26.0–34.0)
MCHC: 30.2 g/dL (ref 30.0–36.0)
MCV: 90.2 fL (ref 80.0–100.0)
Monocytes Absolute: 0.6 K/uL (ref 0.1–1.0)
Monocytes Relative: 10 %
Neutro Abs: 4.1 K/uL (ref 1.7–7.7)
Neutrophils Relative %: 68 %
Platelets: 213 K/uL (ref 150–400)
RBC: 5.11 MIL/uL (ref 3.87–5.11)
RDW: 14.3 % (ref 11.5–15.5)
WBC: 6.1 K/uL (ref 4.0–10.5)
nRBC: 0 % (ref 0.0–0.2)

## 2024-02-28 LAB — BASIC METABOLIC PANEL WITH GFR
Anion gap: 7 (ref 5–15)
BUN: 47 mg/dL — ABNORMAL HIGH (ref 8–23)
CO2: 31 mmol/L (ref 22–32)
Calcium: 9.2 mg/dL (ref 8.9–10.3)
Chloride: 102 mmol/L (ref 98–111)
Creatinine, Ser: 1 mg/dL (ref 0.44–1.00)
GFR, Estimated: 56 mL/min — ABNORMAL LOW
Glucose, Bld: 172 mg/dL — ABNORMAL HIGH (ref 70–99)
Potassium: 6 mmol/L — ABNORMAL HIGH (ref 3.5–5.1)
Sodium: 141 mmol/L (ref 135–145)

## 2024-02-28 LAB — IRON AND TIBC
Iron: 62 ug/dL (ref 28–170)
Saturation Ratios: 24 % (ref 10.4–31.8)
TIBC: 256 ug/dL (ref 250–450)
UIBC: 195 ug/dL

## 2024-02-28 LAB — FERRITIN: Ferritin: 279 ng/mL (ref 11–307)

## 2024-02-28 LAB — VITAMIN B12: Vitamin B-12: 1405 pg/mL — ABNORMAL HIGH (ref 180–914)

## 2024-02-28 NOTE — Progress Notes (Signed)
 Can you find out why this patient's potassium level was elevated?  Looks like 3 months ago was 5.7 and then it came down to 5.1 but now it is 6.0.  She is on several blood pressure medicines that potentially may be causing her to retain potassium but we need to get this down.  No and I can order her some Kayexalate or something to help with that.  Delon Hope, AGNP-C Department of Hematology/Oncology [facility]  Phone: [phone]  02/28/2024 2:13 PM

## 2024-03-01 LAB — METHYLMALONIC ACID, SERUM: Methylmalonic Acid, Quantitative: 446 nmol/L — ABNORMAL HIGH (ref 0–378)

## 2024-03-02 NOTE — Progress Notes (Signed)
 Message left for call back

## 2024-03-04 ENCOUNTER — Telehealth: Payer: Self-pay

## 2024-03-04 ENCOUNTER — Other Ambulatory Visit: Payer: Self-pay | Admitting: Family Medicine

## 2024-03-04 DIAGNOSIS — E875 Hyperkalemia: Secondary | ICD-10-CM

## 2024-03-04 NOTE — Telephone Encounter (Signed)
 Spoke with Gia, on dpr and informed per potassium level being elevated and re-check bmp is indicated.

## 2024-03-04 NOTE — Telephone Encounter (Signed)
 Called and left a message for the patient, was not able to reach daughter on her phone. Upon return call patient will need to be informed ; a repeat BMP to recheck potassium which is currently elevated per Dr Bluford

## 2024-03-04 NOTE — Progress Notes (Signed)
 Spoke to daughter and advised that, per Delon Hope, NP she should reach out to her PCP regarding BP meds and elevated K+.  Reviewed symptoms to be aware of that require immediate attention.  Verbalized understanding.  Message sent to Dr. Bluford regarding recommendations.

## 2024-03-05 LAB — BASIC METABOLIC PANEL WITH GFR
BUN/Creatinine Ratio: 47 — ABNORMAL HIGH (ref 12–28)
BUN: 60 mg/dL — ABNORMAL HIGH (ref 8–27)
CO2: 25 mmol/L (ref 20–29)
Calcium: 9.3 mg/dL (ref 8.7–10.3)
Chloride: 102 mmol/L (ref 96–106)
Creatinine, Ser: 1.27 mg/dL — ABNORMAL HIGH (ref 0.57–1.00)
Glucose: 195 mg/dL — ABNORMAL HIGH (ref 70–99)
Potassium: 6.3 mmol/L — ABNORMAL HIGH (ref 3.5–5.2)
Sodium: 143 mmol/L (ref 134–144)
eGFR: 42 mL/min/1.73 — ABNORMAL LOW

## 2024-03-06 ENCOUNTER — Inpatient Hospital Stay: Admitting: Oncology

## 2024-03-06 ENCOUNTER — Inpatient Hospital Stay

## 2024-03-06 VITALS — BP 127/69 | HR 81 | Temp 98.0°F | Resp 22 | Wt 237.7 lb

## 2024-03-06 DIAGNOSIS — R0602 Shortness of breath: Secondary | ICD-10-CM

## 2024-03-06 DIAGNOSIS — E875 Hyperkalemia: Secondary | ICD-10-CM | POA: Diagnosis not present

## 2024-03-06 DIAGNOSIS — D500 Iron deficiency anemia secondary to blood loss (chronic): Secondary | ICD-10-CM | POA: Diagnosis not present

## 2024-03-06 DIAGNOSIS — E538 Deficiency of other specified B group vitamins: Secondary | ICD-10-CM | POA: Diagnosis not present

## 2024-03-06 LAB — POTASSIUM: Potassium: 5.8 mmol/L — ABNORMAL HIGH (ref 3.5–5.1)

## 2024-03-06 MED ORDER — FERROUS GLUCONATE 324 (38 FE) MG PO TABS: 324.0000 mg | ORAL_TABLET | ORAL | 3 refills | Status: AC

## 2024-03-06 NOTE — Assessment & Plan Note (Addendum)
 Has received IV iron  in the past last given on 12/14/22 and 12/03/23 Monoferric  1 g. Reports occasional bright red blood per rectum and feels like it is from her hemorrhoids and she has been having nosebleeds.  She was seen at [facility], ED for epistaxis she could not get to stop and needed to be cauterized. Patient recently had colonoscopy 02/20/2023 for heme positive stool which showed diverticuli several polyps in the cecum and nonbleeding internal hemorrhoids.  Pathology was unremarkable.  EGD showed gastric hemorrhage erosion. Reports she has noticed bright red blood per rectum when she wipes that is due to hemorrhoidal bleeding.  No melena or hematochezia. Repeat labs from 11/21/2023 show a decline in her ferritin with low iron  saturations so she received 1 dose Monoferric  on 12/03/2023. Repeat labs from 02/28/2024 showed iron  saturations 24%, ferritin 279 with normal TIBC.  Hemoglobin 13.9. No additional IV iron  needed at this time. Would recommend starting oral iron  every other day with vitamin C.  New Rx sent to pharmacy.

## 2024-03-06 NOTE — Progress Notes (Signed)
 [facility] OFFICE PROGRESS NOTE  [name], Shelly G, DO  ASSESSMENT & PLAN:   Assessment & Plan Iron  deficiency anemia due to chronic blood loss Has received IV iron  in the past last given on 12/14/22 and 12/03/23 Monoferric  1 g. Reports occasional bright red blood per rectum and feels like it is from her hemorrhoids and she has been having nosebleeds.  She was seen at [facility], ED for epistaxis she could not get to stop and needed to be cauterized. Patient recently had colonoscopy 02/20/2023 for heme positive stool which showed diverticuli several polyps in the cecum and nonbleeding internal hemorrhoids.  Pathology was unremarkable.  EGD showed gastric hemorrhage erosion. Reports she has noticed bright red blood per rectum when she wipes that is due to hemorrhoidal bleeding.  No melena or hematochezia. Repeat labs from 11/21/2023 show a decline in her ferritin with low iron  saturations so she received 1 dose Monoferric  on 12/03/2023. Repeat labs from 02/28/2024 showed iron  saturations 24%, ferritin 279 with normal TIBC.  Hemoglobin 13.9. No additional IV iron  needed at this time. Would recommend starting oral iron  every other day with vitamin C.  New Rx sent to pharmacy.  Vitamin B12 deficiency disease Patient receives B12 shots monthly. Most recent labs show an elevated MMA with a B12 level of 1405. Continue B12 shots monthly. SOB (shortness of breath) Hx of asthma.  Has been trying to get in touch with PCP but he has not responded.  Continue albuterol  inhaler as needed.  Hyperkalemia Has had elevated potassium levels in the past but most recently had potassium of 6.0 with repeat of 6.3. She has reached out to her PCP but has not been given any medications to bring it down. Given it was recently elevated more than before, would recommend repeating potassium level today and will address if not trending back down. Etiology likely due to BP meds. She eats a diet low in potassium  per her kidney specialist.  Orders Placed This Encounter  Procedures   Potassium    Standing Status:   Future    Number of Occurrences:   1    Expected Date:   03/06/2024    Expiration Date:   03/06/2025    INTERVAL HISTORY: Patient returns for follow-up for anemia secondary to iron  deficiency and CKD.  Patient denies recent hospitalizations, surgeries or changes to her baseline health.  She was evaluated on 11/09/2023 for epistaxis-left side.  Left anterior septum cauterized with silver  nitrate.  Has had 3 additional nosebleeds since.  She was seen by her PCP who referred her to ENT.  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  She continued with cefadroxil .  Had some mild lower extremity edema compression stockings.  This appears to be helping.  Patient has venous stasis ulcer and was referred back for wound care because of some new lower extremity swelling with weeping.  Patient has been having some hallucinations prompting CT of her brain and then MRI of brain which she had done yesterday.  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  No further evaluation or follow-up is needed.  There are multiple T2 hyperintensities with the cerebral white matter.  These are nonspecific and of uncertain etiology or clinical significance.  Patient will need a nonemergent MRI.  Patient had bone density which showed osteoporosis on 11/13/2023.  Patient is currently taking vitamin D supplements.  Patient presents today with  her granddaughter who helps with a lot of the history.  Reports she thinks her mother has had dementia for going on 5 years now and it is slightly worsening and she is now having hallucinations.  Reports appetite is 100% energy levels are 25%.  Reports numbness and tingling in her feet.  We reviewed vitamin B12, MMA, CMP, CBC, iron  panel and ferritin.  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%, HER-2 negative ratio 1.27 copy #1.9 T1 a N0 stage Ia   09/25/2017 Cancer Staging   Staging form: Breast, AJCC 8th Edition - Clinical: Stage IA (cT1a, cN0, cM0, G2, ER+, PR+, HER2-) - Signed by Odean Potts, MD on 03/13/2022 Method of lymph node assessment: Clinical Histologic grading system: 3 grade system Laterality: Left Tumor size (mm): 5   10/21/2017 Surgery   10/21/17: Left Lumpectomy: Encapsulated papillary carcinoma 0.8 cm, grade 2, Margins Neg, ER 95%, PR 75%, HER-2 negative ratio 1.27 copy #1.9 T1 a N0 stage Ia   12/2017 -  Anti-estrogen oral therapy   Tamoxifen  daily      CBC    Component Value Date/Time   WBC 6.1 02/28/2024 1116   RBC 5.11 02/28/2024 1116   HGB 13.9 02/28/2024 1116   HGB 12.5 11/13/2023 1500   HCT 46.1 (H) 02/28/2024 1116   HCT 40.5 11/13/2023 1500   PLT 213 02/28/2024 1116   PLT 246 11/13/2023 1500   MCV 90.2 02/28/2024 1116   MCV 89 11/13/2023 1500   MCH 27.2 02/28/2024 1116   MCHC 30.2 02/28/2024 1116   RDW 14.3 02/28/2024 1116   RDW 13.7 11/13/2023 1500   LYMPHSABS 1.0 02/28/2024 1116   MONOABS 0.6 02/28/2024 1116   EOSABS 0.3 02/28/2024 1116   BASOSABS 0.1 02/28/2024 1116       Latest Ref Rng & Units 03/06/2024    1:51 PM 03/04/2024    1:58 PM 02/28/2024   11:16 AM  CMP  Glucose 70 - 99 mg/dL  804  827   BUN 8 - 27 mg/dL  60  47   Creatinine 9.42 - 1.00 mg/dL  8.72  8.99   Sodium 865 - 144 mmol/L  143  141   Potassium 3.5 - 5.1 mmol/L 5.8  6.3  6.0   Chloride 96 - 106 mmol/L  102  102   CO2 20 - 29 mmol/L  25  31   Calcium 8.7 - 10.3 mg/dL  9.3  9.2      Lab Results  Component Value Date   FERRITIN 279  02/28/2024   VITAMINB12 1,405 (H) 02/28/2024    Vitals:   03/06/24 1317 03/06/24 1322  BP: (!) 171/74 127/69  Pulse: 81   Resp: (!) 22   Temp: 98 F (36.7 C)   SpO2: 93%     Review of System:  Review of Systems  Constitutional:  Positive for malaise/fatigue.  Neurological:  Positive for dizziness and tingling.  Psychiatric/Behavioral:  Positive for hallucinations and memory loss.     Physical Exam: Physical Exam Vitals reviewed: Pleasant patient sitting in wheelchair..  Constitutional:      Appearance: Normal appearance.  HENT:     Head: Normocephalic and atraumatic.  Eyes:     Pupils:  Pupils are equal, round, and reactive to light.  Cardiovascular:     Rate and Rhythm: Normal rate and regular rhythm.     Heart sounds: Normal heart sounds. No murmur heard. Pulmonary:     Effort: Pulmonary effort is normal.     Breath sounds: Normal breath sounds. No wheezing.  Abdominal:     General: Bowel sounds are normal. There is no distension.     Palpations: Abdomen is soft.     Tenderness: There is no abdominal tenderness.  Musculoskeletal:        General: Normal range of motion.     Cervical back: Normal range of motion.  Skin:    General: Skin is warm and dry.     Findings: No rash.  Neurological:     Mental Status: She is alert and oriented to person, place, and time.     Gait: Gait is intact.  Psychiatric:        Mood and Affect: Mood and affect normal.        Cognition and Memory: Memory normal.        Judgment: Judgment normal.      I spent 20 minutes dedicated to the care of this patient (face-to-face and non-face-to-face) on the date of the encounter to include what is described in the assessment and plan.,  Delon Hope, NP 03/09/2024 6:54 AM

## 2024-03-06 NOTE — Assessment & Plan Note (Addendum)
 Hx of asthma.  Has been trying to get in touch with PCP but he has not responded.  Continue albuterol  inhaler as needed.

## 2024-03-06 NOTE — Assessment & Plan Note (Addendum)
 Patient receives B12 shots monthly. Most recent labs show an elevated MMA with a B12 level of 1405. Continue B12 shots monthly.

## 2024-03-08 ENCOUNTER — Ambulatory Visit: Payer: Self-pay | Admitting: Family Medicine

## 2024-03-09 ENCOUNTER — Encounter: Payer: Self-pay | Admitting: Oncology

## 2024-03-09 ENCOUNTER — Ambulatory Visit: Payer: Self-pay | Admitting: Oncology

## 2024-03-09 NOTE — Assessment & Plan Note (Signed)
 Has had elevated potassium levels in the past but most recently had potassium of 6.0 with repeat of 6.3. She has reached out to her PCP but has not been given any medications to bring it down. Given it was recently elevated more than before, would recommend repeating potassium level today and will address if not trending back down. Etiology likely due to BP meds. She eats a diet low in potassium per her kidney specialist.

## 2024-03-09 NOTE — Assessment & Plan Note (Signed)
 Patient has had elevated potassium level intermittently but most recently found

## 2024-03-14 ENCOUNTER — Other Ambulatory Visit: Payer: Self-pay | Admitting: Family Medicine

## 2024-03-14 LAB — BASIC METABOLIC PANEL WITH GFR
BUN/Creatinine Ratio: 51 — ABNORMAL HIGH (ref 12–28)
BUN: 56 mg/dL — ABNORMAL HIGH (ref 8–27)
CO2: 26 mmol/L (ref 20–29)
Calcium: 9.1 mg/dL (ref 8.7–10.3)
Chloride: 107 mmol/L — ABNORMAL HIGH (ref 96–106)
Creatinine, Ser: 1.09 mg/dL — ABNORMAL HIGH (ref 0.57–1.00)
Glucose: 229 mg/dL — ABNORMAL HIGH (ref 70–99)
Potassium: 6.2 mmol/L — ABNORMAL HIGH (ref 3.5–5.2)
Sodium: 144 mmol/L (ref 134–144)
eGFR: 51 mL/min/{1.73_m2} — ABNORMAL LOW

## 2024-03-14 MED ORDER — SODIUM ZIRCONIUM CYCLOSILICATE 10 G PO PACK: 10.0000 g | PACK | Freq: Three times a day (TID) | ORAL | 0 refills | Status: AC

## 2024-03-19 ENCOUNTER — Ambulatory Visit: Admitting: Family Medicine

## 2024-04-08 ENCOUNTER — Ambulatory Visit: Admitting: Family Medicine

## 2024-04-30 ENCOUNTER — Ambulatory Visit: Admitting: Internal Medicine

## 2024-06-11 ENCOUNTER — Inpatient Hospital Stay: Admitting: Oncology

## 2024-06-11 ENCOUNTER — Inpatient Hospital Stay

## 2024-07-15 ENCOUNTER — Encounter (INDEPENDENT_AMBULATORY_CARE_PROVIDER_SITE_OTHER): Admitting: Ophthalmology
